# Patient Record
Sex: Female | Born: 1945 | Race: White | Hispanic: No | Marital: Married | Smoking: Current every day smoker
Health system: Southern US, Community
[De-identification: ages and names within clinical notes are randomized; demographics above are authoritative.]

## PROBLEM LIST (undated history)

## (undated) DIAGNOSIS — Z780 Asymptomatic menopausal state: Secondary | ICD-10-CM

## (undated) DIAGNOSIS — K219 Gastro-esophageal reflux disease without esophagitis: Secondary | ICD-10-CM

## (undated) DIAGNOSIS — D126 Benign neoplasm of colon, unspecified: Secondary | ICD-10-CM

## (undated) DIAGNOSIS — R432 Parageusia: Secondary | ICD-10-CM

## (undated) DIAGNOSIS — L03213 Periorbital cellulitis: Secondary | ICD-10-CM

## (undated) DIAGNOSIS — K589 Irritable bowel syndrome without diarrhea: Secondary | ICD-10-CM

## (undated) DIAGNOSIS — R430 Anosmia: Secondary | ICD-10-CM

## (undated) DIAGNOSIS — T8859XA Other complications of anesthesia, initial encounter: Secondary | ICD-10-CM

## (undated) DIAGNOSIS — A4902 Methicillin resistant Staphylococcus aureus infection, unspecified site: Secondary | ICD-10-CM

## (undated) DIAGNOSIS — I10 Essential (primary) hypertension: Secondary | ICD-10-CM

## (undated) DIAGNOSIS — J449 Chronic obstructive pulmonary disease, unspecified: Secondary | ICD-10-CM

## (undated) DIAGNOSIS — R05 Cough: Secondary | ICD-10-CM

## (undated) DIAGNOSIS — T4145XA Adverse effect of unspecified anesthetic, initial encounter: Secondary | ICD-10-CM

## (undated) DIAGNOSIS — E785 Hyperlipidemia, unspecified: Secondary | ICD-10-CM

## (undated) DIAGNOSIS — R059 Cough, unspecified: Secondary | ICD-10-CM

## (undated) DIAGNOSIS — J189 Pneumonia, unspecified organism: Secondary | ICD-10-CM

## (undated) DIAGNOSIS — R0600 Dyspnea, unspecified: Secondary | ICD-10-CM

## (undated) DIAGNOSIS — N189 Chronic kidney disease, unspecified: Secondary | ICD-10-CM

## (undated) DIAGNOSIS — M1990 Unspecified osteoarthritis, unspecified site: Secondary | ICD-10-CM

## (undated) DIAGNOSIS — R413 Other amnesia: Secondary | ICD-10-CM

## (undated) DIAGNOSIS — R112 Nausea with vomiting, unspecified: Secondary | ICD-10-CM

## (undated) DIAGNOSIS — Z9889 Other specified postprocedural states: Secondary | ICD-10-CM

## (undated) DIAGNOSIS — F419 Anxiety disorder, unspecified: Secondary | ICD-10-CM

## (undated) HISTORY — DX: Cough, unspecified: R05.9

## (undated) HISTORY — DX: Essential (primary) hypertension: I10

## (undated) HISTORY — DX: Anosmia: R43.0

## (undated) HISTORY — PX: POLYPECTOMY: SHX149

## (undated) HISTORY — DX: Asymptomatic menopausal state: Z78.0

## (undated) HISTORY — DX: Methicillin resistant Staphylococcus aureus infection, unspecified site: A49.02

## (undated) HISTORY — DX: Irritable bowel syndrome, unspecified: K58.9

## (undated) HISTORY — PX: TUBAL LIGATION: SHX77

## (undated) HISTORY — DX: Cough: R05

## (undated) HISTORY — DX: Unspecified osteoarthritis, unspecified site: M19.90

## (undated) HISTORY — DX: Benign neoplasm of colon, unspecified: D12.6

## (undated) HISTORY — DX: Periorbital cellulitis: L03.213

## (undated) HISTORY — PX: BACK SURGERY: SHX140

## (undated) HISTORY — DX: Other amnesia: R41.3

## (undated) HISTORY — DX: Hyperlipidemia, unspecified: E78.5

## (undated) HISTORY — DX: Parageusia: R43.2

## (undated) HISTORY — PX: APPENDECTOMY: SHX54

---

## 1999-01-10 ENCOUNTER — Ambulatory Visit: Admission: RE | Admit: 1999-01-10 | Discharge: 1999-01-10 | Payer: Self-pay | Admitting: Vascular Surgery

## 1999-01-10 ENCOUNTER — Encounter: Payer: Self-pay | Admitting: Vascular Surgery

## 1999-09-06 ENCOUNTER — Other Ambulatory Visit: Admission: RE | Admit: 1999-09-06 | Discharge: 1999-09-06 | Payer: Self-pay | Admitting: Family Medicine

## 2000-10-20 ENCOUNTER — Other Ambulatory Visit: Admission: RE | Admit: 2000-10-20 | Discharge: 2000-10-20 | Payer: Self-pay | Admitting: Family Medicine

## 2000-11-20 ENCOUNTER — Other Ambulatory Visit: Admission: RE | Admit: 2000-11-20 | Discharge: 2000-11-20 | Payer: Self-pay | Admitting: *Deleted

## 2000-11-20 ENCOUNTER — Encounter (INDEPENDENT_AMBULATORY_CARE_PROVIDER_SITE_OTHER): Payer: Self-pay | Admitting: Specialist

## 2001-07-16 ENCOUNTER — Other Ambulatory Visit: Admission: RE | Admit: 2001-07-16 | Discharge: 2001-07-16 | Payer: Self-pay | Admitting: Family Medicine

## 2004-07-19 ENCOUNTER — Other Ambulatory Visit: Admission: RE | Admit: 2004-07-19 | Discharge: 2004-07-19 | Payer: Self-pay | Admitting: Family Medicine

## 2004-12-13 ENCOUNTER — Ambulatory Visit: Payer: Self-pay | Admitting: Family Medicine

## 2004-12-16 DIAGNOSIS — L03213 Periorbital cellulitis: Secondary | ICD-10-CM

## 2004-12-16 DIAGNOSIS — A4902 Methicillin resistant Staphylococcus aureus infection, unspecified site: Secondary | ICD-10-CM

## 2004-12-16 HISTORY — DX: Methicillin resistant Staphylococcus aureus infection, unspecified site: A49.02

## 2004-12-16 HISTORY — DX: Periorbital cellulitis: L03.213

## 2005-01-08 ENCOUNTER — Ambulatory Visit: Payer: Self-pay | Admitting: Family Medicine

## 2005-04-09 ENCOUNTER — Ambulatory Visit: Payer: Self-pay | Admitting: Family Medicine

## 2005-07-02 ENCOUNTER — Ambulatory Visit: Payer: Self-pay | Admitting: Family Medicine

## 2005-07-12 ENCOUNTER — Emergency Department (HOSPITAL_COMMUNITY): Admission: EM | Admit: 2005-07-12 | Discharge: 2005-07-12 | Payer: Self-pay | Admitting: Emergency Medicine

## 2005-07-16 ENCOUNTER — Ambulatory Visit (HOSPITAL_COMMUNITY): Admission: RE | Admit: 2005-07-16 | Discharge: 2005-07-16 | Payer: Self-pay | Admitting: Otolaryngology

## 2005-07-18 ENCOUNTER — Emergency Department (HOSPITAL_COMMUNITY): Admission: EM | Admit: 2005-07-18 | Discharge: 2005-07-18 | Payer: Self-pay | Admitting: Emergency Medicine

## 2005-07-23 ENCOUNTER — Ambulatory Visit: Payer: Self-pay | Admitting: Family Medicine

## 2005-08-07 ENCOUNTER — Ambulatory Visit: Payer: Self-pay | Admitting: Family Medicine

## 2005-08-21 ENCOUNTER — Ambulatory Visit: Payer: Self-pay | Admitting: Family Medicine

## 2005-10-22 ENCOUNTER — Ambulatory Visit: Payer: Self-pay | Admitting: Family Medicine

## 2005-10-29 ENCOUNTER — Other Ambulatory Visit: Admission: RE | Admit: 2005-10-29 | Discharge: 2005-10-29 | Payer: Self-pay | Admitting: Family Medicine

## 2005-10-29 ENCOUNTER — Ambulatory Visit: Payer: Self-pay | Admitting: Family Medicine

## 2005-10-29 ENCOUNTER — Encounter: Payer: Self-pay | Admitting: Family Medicine

## 2006-05-15 ENCOUNTER — Ambulatory Visit: Payer: Self-pay | Admitting: Family Medicine

## 2006-06-03 ENCOUNTER — Ambulatory Visit: Payer: Self-pay | Admitting: Family Medicine

## 2006-07-16 ENCOUNTER — Encounter: Payer: Self-pay | Admitting: Family Medicine

## 2006-07-25 ENCOUNTER — Ambulatory Visit: Payer: Self-pay | Admitting: Family Medicine

## 2006-07-31 ENCOUNTER — Other Ambulatory Visit: Admission: RE | Admit: 2006-07-31 | Discharge: 2006-07-31 | Payer: Self-pay | Admitting: Family Medicine

## 2006-07-31 ENCOUNTER — Ambulatory Visit: Payer: Self-pay | Admitting: Family Medicine

## 2006-07-31 ENCOUNTER — Encounter: Payer: Self-pay | Admitting: Family Medicine

## 2006-11-25 ENCOUNTER — Ambulatory Visit: Payer: Self-pay | Admitting: Gastroenterology

## 2006-12-16 DIAGNOSIS — D126 Benign neoplasm of colon, unspecified: Secondary | ICD-10-CM

## 2006-12-16 HISTORY — DX: Benign neoplasm of colon, unspecified: D12.6

## 2006-12-30 ENCOUNTER — Ambulatory Visit: Payer: Self-pay | Admitting: Gastroenterology

## 2006-12-30 ENCOUNTER — Encounter (INDEPENDENT_AMBULATORY_CARE_PROVIDER_SITE_OTHER): Payer: Self-pay | Admitting: *Deleted

## 2007-01-06 ENCOUNTER — Ambulatory Visit: Payer: Self-pay | Admitting: Family Medicine

## 2007-01-06 LAB — CONVERTED CEMR LAB
CA 125: 9.1 units/mL (ref 0.0–30.2)
Sed Rate: 18 mm/hr (ref 0–22)

## 2007-01-07 ENCOUNTER — Encounter: Admission: RE | Admit: 2007-01-07 | Discharge: 2007-01-07 | Payer: Self-pay | Admitting: Family Medicine

## 2007-01-07 LAB — CONVERTED CEMR LAB
ALT: 22 units/L (ref 0–40)
Alkaline Phosphatase: 108 units/L (ref 39–117)
Basophils Relative: 0 % (ref 0.0–1.0)
Calcium: 9.4 mg/dL (ref 8.4–10.5)
Eosinophils Absolute: 0.1 10*3/uL (ref 0.0–0.6)
Eosinophils Relative: 1 % (ref 0.0–5.0)
Glucose, Bld: 107 mg/dL — ABNORMAL HIGH (ref 70–99)
Hgb A1c MFr Bld: 6.7 % — ABNORMAL HIGH (ref 4.6–6.0)
MCV: 89.7 fL (ref 78.0–100.0)
Platelets: 329 10*3/uL (ref 150–400)
Potassium: 4.3 meq/L (ref 3.5–5.1)
RBC: 4.9 M/uL (ref 3.87–5.11)
RDW: 13.2 % (ref 11.5–14.6)
Total Protein: 6.7 g/dL (ref 6.0–8.3)
WBC: 11.6 10*3/uL — ABNORMAL HIGH (ref 4.5–10.5)

## 2007-01-09 ENCOUNTER — Encounter: Admission: RE | Admit: 2007-01-09 | Discharge: 2007-01-09 | Payer: Self-pay | Admitting: *Deleted

## 2007-02-18 ENCOUNTER — Ambulatory Visit: Payer: Self-pay | Admitting: Family Medicine

## 2007-02-24 ENCOUNTER — Ambulatory Visit: Payer: Self-pay | Admitting: Family Medicine

## 2007-05-18 ENCOUNTER — Ambulatory Visit: Payer: Self-pay | Admitting: Family Medicine

## 2007-07-20 ENCOUNTER — Ambulatory Visit: Payer: Self-pay | Admitting: Family Medicine

## 2007-07-20 LAB — CONVERTED CEMR LAB
ALT: 18 units/L (ref 0–35)
AST: 20 units/L (ref 0–37)
Albumin: 3.8 g/dL (ref 3.5–5.2)
Basophils Absolute: 0.1 10*3/uL (ref 0.0–0.1)
Bilirubin Urine: NEGATIVE
Blood in Urine, dipstick: NEGATIVE
Calcium: 9.1 mg/dL (ref 8.4–10.5)
Chloride: 105 meq/L (ref 96–112)
Cholesterol: 136 mg/dL (ref 0–200)
Eosinophils Absolute: 0.1 10*3/uL (ref 0.0–0.6)
GFR calc Af Amer: 94 mL/min
GFR calc non Af Amer: 78 mL/min
Glucose, Urine, Semiquant: NEGATIVE
HDL: 56.9 mg/dL (ref >39.0)
MCHC: 34.9 g/dL (ref 30.0–36.0)
MCV: 89.1 fL (ref 78.0–100.0)
Monocytes Relative: 6.7 % (ref 3.0–11.0)
Platelets: 304 10*3/uL (ref 150–400)
Protein, U semiquant: NEGATIVE
RBC: 4.24 M/uL (ref 3.87–5.11)
Sodium: 143 meq/L (ref 135–145)
TSH: 2.33 microintl units/mL (ref 0.35–5.50)
Total CHOL/HDL Ratio: 2.4
Triglycerides: 140 mg/dL (ref 0–149)
WBC Urine, dipstick: NEGATIVE
pH: 6

## 2007-07-21 ENCOUNTER — Telehealth: Payer: Self-pay | Admitting: Family Medicine

## 2007-07-21 ENCOUNTER — Encounter: Payer: Self-pay | Admitting: Family Medicine

## 2007-07-21 DIAGNOSIS — E119 Type 2 diabetes mellitus without complications: Secondary | ICD-10-CM | POA: Insufficient documentation

## 2007-07-21 DIAGNOSIS — I10 Essential (primary) hypertension: Secondary | ICD-10-CM | POA: Insufficient documentation

## 2007-07-21 DIAGNOSIS — E785 Hyperlipidemia, unspecified: Secondary | ICD-10-CM | POA: Insufficient documentation

## 2007-07-21 DIAGNOSIS — J309 Allergic rhinitis, unspecified: Secondary | ICD-10-CM | POA: Insufficient documentation

## 2007-07-28 ENCOUNTER — Encounter: Admission: RE | Admit: 2007-07-28 | Discharge: 2007-07-28 | Payer: Self-pay | Admitting: Family Medicine

## 2007-07-30 ENCOUNTER — Encounter: Payer: Self-pay | Admitting: Family Medicine

## 2007-07-30 ENCOUNTER — Other Ambulatory Visit: Admission: RE | Admit: 2007-07-30 | Discharge: 2007-07-30 | Payer: Self-pay | Admitting: Family Medicine

## 2007-07-30 ENCOUNTER — Ambulatory Visit: Payer: Self-pay | Admitting: Family Medicine

## 2007-10-07 ENCOUNTER — Ambulatory Visit: Payer: Self-pay | Admitting: Family Medicine

## 2007-10-07 DIAGNOSIS — M109 Gout, unspecified: Secondary | ICD-10-CM | POA: Insufficient documentation

## 2007-10-19 ENCOUNTER — Telehealth: Payer: Self-pay | Admitting: Family Medicine

## 2007-10-20 ENCOUNTER — Telehealth: Payer: Self-pay | Admitting: Family Medicine

## 2007-10-21 LAB — CONVERTED CEMR LAB: Hgb A1c MFr Bld: 6 % (ref 4.6–6.0)

## 2007-10-27 ENCOUNTER — Telehealth: Payer: Self-pay | Admitting: Family Medicine

## 2008-01-07 ENCOUNTER — Ambulatory Visit: Payer: Self-pay | Admitting: Family Medicine

## 2008-01-07 DIAGNOSIS — M129 Arthropathy, unspecified: Secondary | ICD-10-CM | POA: Insufficient documentation

## 2008-01-19 ENCOUNTER — Telehealth: Payer: Self-pay | Admitting: Family Medicine

## 2008-01-20 ENCOUNTER — Ambulatory Visit: Payer: Self-pay | Admitting: Family Medicine

## 2008-02-16 ENCOUNTER — Ambulatory Visit: Payer: Self-pay | Admitting: Family Medicine

## 2008-03-01 ENCOUNTER — Telehealth: Payer: Self-pay | Admitting: Family Medicine

## 2008-03-03 ENCOUNTER — Telehealth: Payer: Self-pay | Admitting: Family Medicine

## 2008-03-16 ENCOUNTER — Telehealth: Payer: Self-pay | Admitting: Family Medicine

## 2008-03-29 ENCOUNTER — Ambulatory Visit: Payer: Self-pay | Admitting: Family Medicine

## 2008-03-29 DIAGNOSIS — D649 Anemia, unspecified: Secondary | ICD-10-CM | POA: Insufficient documentation

## 2008-03-29 DIAGNOSIS — E039 Hypothyroidism, unspecified: Secondary | ICD-10-CM | POA: Insufficient documentation

## 2008-03-31 LAB — CONVERTED CEMR LAB
Basophils Relative: 0.5 % (ref 0.0–1.0)
CO2: 31 meq/L (ref 19–32)
Chloride: 108 meq/L (ref 96–112)
Eosinophils Absolute: 0.1 10*3/uL (ref 0.0–0.7)
Eosinophils Relative: 1.3 % (ref 0.0–5.0)
Hgb A1c MFr Bld: 6.3 % — ABNORMAL HIGH (ref 4.6–6.0)
MCV: 90.1 fL (ref 78.0–100.0)
Monocytes Relative: 6.5 % (ref 3.0–12.0)
Neutrophils Relative %: 63.4 % (ref 43.0–77.0)
Potassium: 4.1 meq/L (ref 3.5–5.1)
RBC: 4.17 M/uL (ref 3.87–5.11)
TSH: 3.75 microintl units/mL (ref 0.35–5.50)
Uric Acid, Serum: 4.8 mg/dL (ref 2.4–7.0)
WBC: 8.5 10*3/uL (ref 4.5–10.5)

## 2008-07-20 ENCOUNTER — Encounter: Payer: Self-pay | Admitting: Family Medicine

## 2008-07-26 ENCOUNTER — Ambulatory Visit: Payer: Self-pay | Admitting: Family Medicine

## 2008-07-26 LAB — CONVERTED CEMR LAB
Ketones, urine, test strip: NEGATIVE
Nitrite: NEGATIVE
Specific Gravity, Urine: 1.015
Urobilinogen, UA: 0.2
WBC Urine, dipstick: NEGATIVE

## 2008-08-02 ENCOUNTER — Other Ambulatory Visit: Admission: RE | Admit: 2008-08-02 | Discharge: 2008-08-02 | Payer: Self-pay | Admitting: Family Medicine

## 2008-08-02 ENCOUNTER — Ambulatory Visit: Payer: Self-pay | Admitting: Family Medicine

## 2008-08-02 ENCOUNTER — Encounter: Payer: Self-pay | Admitting: Family Medicine

## 2008-08-08 LAB — CONVERTED CEMR LAB
AST: 25 units/L (ref 0–37)
Albumin: 4 g/dL (ref 3.5–5.2)
Alkaline Phosphatase: 104 units/L (ref 39–117)
BUN: 12 mg/dL (ref 6–23)
Basophils Relative: 1 % (ref 0.0–3.0)
Chloride: 102 meq/L (ref 96–112)
Creatinine, Ser: 0.7 mg/dL (ref 0.4–1.2)
Creatinine,U: 85 mg/dL
Eosinophils Absolute: 0.1 10*3/uL (ref 0.0–0.7)
Eosinophils Relative: 1.5 % (ref 0.0–5.0)
GFR calc Af Amer: 109 mL/min
GFR calc non Af Amer: 90 mL/min
Glucose, Bld: 112 mg/dL — ABNORMAL HIGH (ref 70–99)
HCT: 39.8 % (ref 36.0–46.0)
Hemoglobin: 14 g/dL (ref 12.0–15.0)
Hgb A1c MFr Bld: 6.5 % — ABNORMAL HIGH (ref 4.6–6.0)
MCV: 89.2 fL (ref 78.0–100.0)
Monocytes Absolute: 0.5 10*3/uL (ref 0.1–1.0)
Monocytes Relative: 5.8 % (ref 3.0–12.0)
Potassium: 3.6 meq/L (ref 3.5–5.1)
RBC: 4.47 M/uL (ref 3.87–5.11)
Total CHOL/HDL Ratio: 3.2
Total Protein: 6.9 g/dL (ref 6.0–8.3)
WBC: 8.6 10*3/uL (ref 4.5–10.5)

## 2008-08-17 LAB — CONVERTED CEMR LAB: Vit D, 1,25-Dihydroxy: 29 — ABNORMAL LOW (ref 30–89)

## 2008-10-19 ENCOUNTER — Telehealth: Payer: Self-pay | Admitting: Family Medicine

## 2009-01-03 ENCOUNTER — Ambulatory Visit: Payer: Self-pay | Admitting: Family Medicine

## 2009-01-04 LAB — CONVERTED CEMR LAB
Hgb A1c MFr Bld: 6.4 % — ABNORMAL HIGH (ref 4.6–6.0)
Rhuematoid fact SerPl-aCnc: <20 intl units/mL — ABNORMAL LOW (ref 0.0–20.0)

## 2009-01-05 ENCOUNTER — Telehealth: Payer: Self-pay | Admitting: Family Medicine

## 2009-02-08 ENCOUNTER — Ambulatory Visit: Payer: Self-pay | Admitting: Family Medicine

## 2009-02-08 DIAGNOSIS — J209 Acute bronchitis, unspecified: Secondary | ICD-10-CM | POA: Insufficient documentation

## 2009-04-04 ENCOUNTER — Telehealth: Payer: Self-pay | Admitting: Family Medicine

## 2009-07-25 ENCOUNTER — Encounter: Payer: Self-pay | Admitting: Family Medicine

## 2009-08-01 ENCOUNTER — Ambulatory Visit: Payer: Self-pay | Admitting: Family Medicine

## 2009-08-01 LAB — CONVERTED CEMR LAB
Bilirubin Urine: NEGATIVE
Glucose, Urine, Semiquant: NEGATIVE
Specific Gravity, Urine: 1.01
WBC Urine, dipstick: NEGATIVE
pH: 6

## 2009-08-08 ENCOUNTER — Ambulatory Visit: Payer: Self-pay | Admitting: Family Medicine

## 2009-08-08 LAB — CONVERTED CEMR LAB
AST: 26 units/L (ref 0–37)
Albumin: 4.2 g/dL (ref 3.5–5.2)
BUN: 12 mg/dL (ref 6–23)
CO2: 30 meq/L (ref 19–32)
Chloride: 105 meq/L (ref 96–112)
Cholesterol: 156 mg/dL (ref 0–200)
Creatinine, Ser: 0.7 mg/dL (ref 0.4–1.2)
Eosinophils Absolute: 0.1 10*3/uL (ref 0.0–0.7)
Eosinophils Relative: 1.1 % (ref 0.0–5.0)
Glucose, Bld: 123 mg/dL — ABNORMAL HIGH (ref 70–99)
HCT: 39.9 % (ref 36.0–46.0)
Hgb A1c MFr Bld: 6.3 % (ref 4.6–6.5)
Lymphs Abs: 1.8 10*3/uL (ref 0.7–4.0)
MCHC: 34 g/dL (ref 30.0–36.0)
MCV: 88.9 fL (ref 78.0–100.0)
Microalb Creat Ratio: 15.3 mg/g (ref 0.0–30.0)
Microalb, Ur: 0.6 mg/dL (ref 0.0–1.9)
Monocytes Absolute: 0.6 10*3/uL (ref 0.1–1.0)
Platelets: 276 10*3/uL (ref 150.0–400.0)
Potassium: 4.2 meq/L (ref 3.5–5.1)
RDW: 13.1 % (ref 11.5–14.6)
TSH: 2.55 microintl units/mL (ref 0.35–5.50)
WBC: 9.3 10*3/uL (ref 4.5–10.5)

## 2009-11-28 ENCOUNTER — Telehealth: Payer: Self-pay | Admitting: Family Medicine

## 2010-01-02 ENCOUNTER — Encounter: Payer: Self-pay | Admitting: Family Medicine

## 2010-06-05 ENCOUNTER — Telehealth: Payer: Self-pay | Admitting: Family Medicine

## 2010-07-26 ENCOUNTER — Encounter: Payer: Self-pay | Admitting: Family Medicine

## 2010-08-02 ENCOUNTER — Ambulatory Visit: Payer: Self-pay | Admitting: Family Medicine

## 2010-08-07 ENCOUNTER — Ambulatory Visit: Payer: Self-pay | Admitting: Family Medicine

## 2010-08-07 ENCOUNTER — Other Ambulatory Visit: Admission: RE | Admit: 2010-08-07 | Discharge: 2010-08-07 | Payer: Self-pay | Admitting: Family Medicine

## 2010-08-07 LAB — HM PAP SMEAR

## 2010-08-08 LAB — CONVERTED CEMR LAB
ALT: 18 units/L (ref 0–35)
AST: 21 units/L (ref 0–37)
BUN: 17 mg/dL (ref 6–23)
Bilirubin Urine: NEGATIVE
Bilirubin, Direct: 0.1 mg/dL (ref 0.0–0.3)
Calcium: 9.1 mg/dL (ref 8.4–10.5)
Cholesterol: 145 mg/dL (ref 0–200)
Creatinine, Ser: 0.9 mg/dL (ref 0.4–1.2)
Eosinophils Relative: 1.4 % (ref 0.0–5.0)
GFR calc non Af Amer: 69.68 mL/min (ref >60)
Hgb A1c MFr Bld: 6.3 % (ref 4.6–6.5)
Ketones, urine, test strip: NEGATIVE
LDL Cholesterol: 55 mg/dL (ref 0–99)
Microalb Creat Ratio: 0.4 mg/g (ref 0.0–30.0)
Monocytes Absolute: 0.7 10*3/uL (ref 0.1–1.0)
Monocytes Relative: 7.4 % (ref 3.0–12.0)
Neutrophils Relative %: 70.7 % (ref 43.0–77.0)
Platelets: 275 10*3/uL (ref 150.0–400.0)
Protein, U semiquant: NEGATIVE
Total Bilirubin: 0.6 mg/dL (ref 0.3–1.2)
Triglycerides: 196 mg/dL — ABNORMAL HIGH (ref 0.0–149.0)
Urobilinogen, UA: 0.2
VLDL: 39.2 mg/dL (ref 0.0–40.0)
WBC: 9.2 10*3/uL (ref 4.5–10.5)

## 2010-08-14 ENCOUNTER — Encounter: Payer: Self-pay | Admitting: Family Medicine

## 2010-08-23 ENCOUNTER — Encounter: Payer: Self-pay | Admitting: Family Medicine

## 2010-09-04 ENCOUNTER — Telehealth: Payer: Self-pay | Admitting: Family Medicine

## 2010-09-07 ENCOUNTER — Ambulatory Visit: Payer: Self-pay | Admitting: Family Medicine

## 2010-09-07 DIAGNOSIS — F17200 Nicotine dependence, unspecified, uncomplicated: Secondary | ICD-10-CM | POA: Insufficient documentation

## 2010-09-07 LAB — CONVERTED CEMR LAB
Glucose, Urine, Semiquant: NEGATIVE
Nitrite: NEGATIVE
Protein, U semiquant: 30
Urobilinogen, UA: 0.2

## 2010-09-08 ENCOUNTER — Encounter: Payer: Self-pay | Admitting: Family Medicine

## 2010-09-17 ENCOUNTER — Ambulatory Visit: Payer: Self-pay | Admitting: Family Medicine

## 2010-09-17 DIAGNOSIS — N3000 Acute cystitis without hematuria: Secondary | ICD-10-CM | POA: Insufficient documentation

## 2010-09-18 ENCOUNTER — Encounter: Payer: Self-pay | Admitting: Family Medicine

## 2010-12-31 ENCOUNTER — Ambulatory Visit
Admission: RE | Admit: 2010-12-31 | Discharge: 2010-12-31 | Payer: Self-pay | Source: Home / Self Care | Attending: Internal Medicine | Admitting: Internal Medicine

## 2011-01-03 ENCOUNTER — Telehealth: Payer: Self-pay | Admitting: Internal Medicine

## 2011-01-13 LAB — CONVERTED CEMR LAB: Pap Smear: NEGATIVE

## 2011-01-15 NOTE — Progress Notes (Signed)
Summary: REQ FOR RETURN CALL  Phone Note Call from Patient   Caller: Patient Summary of Call: Pt called to speak with Dr Scotty Court... Pt was advised that Dr Scotty Court was with patients at this time... Pt requested a return call to her in ref to a personal matter (would not elaborate reason).... Pt can be reached at [phone].  Initial call taken by: Debbra Riding,  June 05, 2010 10:35 AM  Follow-up for Phone Call        will call patient

## 2011-01-15 NOTE — Assessment & Plan Note (Signed)
Summary: CPX // RS/PT [facility] FROM BMP/CJR/rsc per doc/njr   Vital Signs:  Patient profile:   65 year old female Menstrual status:  postmenopausal Height:      63 inches Weight:      196 pounds BMI:     34.85 Temp:     98 degrees F Pulse (ortho):   92 / minute Pulse rhythm:   regular BP sitting:   136 / 80  (left arm)  Vitals Entered By: Pura Spice, RN (August 07, 2010 10:34 AM) CC: cpx with pap    History of Present Illness: This 65 year old white married female in for complete physical examination she is a known diabetic control with glipizide 5 mg each day, hypertensive control Lotrel, hyperlipidemia control the Lipitor past it history of gout and takes allopurinol which died At some anxiety and stress and takes diazepam 5 mg t.i.d. p.r.n. for stress has history of allergic rhinitis controlled with Flonase nasal for a Does have an unexplained call which I have prescribed promethazine codeine when needed Has had problem with obesity and almost impossible to lose Main complaint today is that of pain in the left heel and also sciatica of the left leg left hip and left  Allergies: 1)  ! Phenobarbital (Phenobarbital)  Past History:  Past Medical History: Last updated: 07/21/2007 Allergic rhinitis Diabetes mellitus, type II Hyperlipidemia Hypertension menopause  2002  Risk Factors: Smoking Status: current (08/08/2009) Packs/Day: 1.0 (08/08/2009)  Review of Systems      See HPI  The patient denies anorexia, fever, weight loss, weight gain, vision loss, decreased hearing, hoarseness, chest pain, syncope, dyspnea on exertion, peripheral edema, prolonged cough, headaches, hemoptysis, abdominal pain, melena, hematochezia, severe indigestion/heartburn, hematuria, incontinence, genital sores, muscle weakness, suspicious skin lesions, transient blindness, difficulty walking, depression, unusual weight change, abnormal bleeding, enlarged lymph nodes, angioedema, breast masses, and  testicular masses.    Physical Exam  General:  Well-developed,well-nourished,in no acute distress; alert,appropriate and cooperative throughout examinationoverweight-appearing.   Head:  Normocephalic and atraumatic without obvious abnormalities. No apparent alopecia or balding. Eyes:  No corneal or conjunctival inflammation noted. EOMI. Perrla. Funduscopic exam benign, without hemorrhages, exudates or papilledema. Vision grossly normal. Ears:  External ear exam shows no significant lesions or deformities.  Otoscopic examination reveals clear canals, tympanic membranes are intact bilaterally without bulging, retraction, inflammation or discharge. Hearing is grossly normal bilaterally. Nose:  swollen boggy nasal mucosa bilaterally are clear dry and Mouth:  Oral mucosa and oropharynx without lesions or exudates.  Teeth in good repair. Neck:  No deformities, masses, or tenderness noted. Chest Wall:  No deformities, masses, or tenderness noted. Breasts:  No mass, nodules, thickening, tenderness, bulging, retraction, inflamation, nipple discharge or skin changes noted.   Lungs:  Normal respiratory effort, chest expands symmetrically. Lungs are clear to auscultation, no crackles or wheezes. Heart:  Normal rate and regular rhythm. S1 and S2 normal without gallop, murmur, click, rub or other extra sounds. Abdomen:  Bowel sounds positive,abdomen soft and non-tender without masses, organomegaly or hernias noted. Rectal:  No external abnormalities noted. Normal sphincter tone. No rectal masses or tenderness. Genitalia:  Normal introitus for age, no external lesions, no vaginal discharge, mucosa pink and moist, no vaginal or cervical lesions, no vaginal atrophy, no friaility or hemorrhage, normal uterus size and position, no adnexal masses or tenderness Msk:  marked tenderness left heel also left SI joint and minimal tenderness over the right SI joint No limitation of movement Pulses:  R and L  carotid,radial,femoral,dorsalis pedis and posterior tibial pulses are full and equal bilaterally Extremities:  trace pretibial edema Neurologic:  No cranial nerve deficits noted. Station and gait are normal. Plantar reflexes are down-going bilaterally. DTRs are symmetrical throughout. Sensory, motor and coordinative functions appear intact. Skin:  has several small hyperkeratotic benign appearing skin lesions for which the patient is to see the dermatologist for wide awake Cervical Nodes:  No lymphadenopathy noted Axillary Nodes:  No palpable lymphadenopathy Inguinal Nodes:  No significant adenopathy Psych:  Cognition and judgment appear intact. Alert and cooperative with normal attention span and concentration. No apparent delusions, illusions, hallucinations   Impression & Recommendations:  Problem # 1:  EDEMA- LOCALIZED (ICD-782.3) Assessment Improved  Her updated medication list for this problem includes:    Hydrochlorothiazide 25 Mg Tabs (Hydrochlorothiazide) .Marland Kitchen... Take 1 tab bid  Problem # 2:  ARTHRITIS (ICD-716.90) Assessment: Deteriorated  Orders: Depo- Medrol 80mg  (J1040) Depo- Medrol 40mg  (J1030) Admin of Therapeutic Inj  intramuscular or subcutaneous (04540)  Problem # 3:  GOUT NOS (ICD-274.9) Assessment: Improved  Her updated medication list for this problem includes:    Allopurinol 100 Mg Tabs (Allopurinol) .Marland Kitchen... 1 by mouth two times a day    Diclofenac Sodium 75 Mg Tbec (Diclofenac sodium) .Marland Kitchen... 1 by mouth two times a day pc  Problem # 4:  WELL ADULT EXAM (ICD-V70.0) Assessment: Unchanged  Problem # 5:  HYPERTENSION (ICD-401.9) Assessment: Improved  Her updated medication list for this problem includes:    Lotrel 10-20 Mg Caps (Amlodipine besy-benazepril hcl) .Marland Kitchen... 1 once daily    Hydrochlorothiazide 25 Mg Tabs (Hydrochlorothiazide) .Marland Kitchen... Take 1 tab bid  Problem # 6:  HYPERLIPIDEMIA (ICD-272.4) Assessment: Improved  Her updated medication list for this  problem includes:    Lipitor 40 Mg Tabs (Atorvastatin calcium) ..... Once daily  Orders: EKG w/ Interpretation (93000)  Problem # 7:  DIABETES MELLITUS, TYPE II (ICD-250.00) Assessment: Improved  Her updated medication list for this problem includes:    Glipizide Xl 5 Mg Tb24 (Glipizide) .Marland Kitchen... Take 1 tablet by mouth once a day    Lotrel 10-20 Mg Caps (Amlodipine besy-benazepril hcl) .Marland Kitchen... 1 once daily  Problem # 8:  ALLERGIC RHINITIS (ICD-477.9) Assessment: Improved  Her updated medication list for this problem includes:    Flonase 50 Mcg/act Susp (Fluticasone propionate) ..... Inhale 1 puff two times a day    Promethazine Hcl 25 Mg Tabs (Promethazine hcl) .Marland Kitchen... Take 1 every 4 hrs as needed for nausea  Complete Medication List: 1)  Flonase 50 Mcg/act Susp (Fluticasone propionate) .... Inhale 1 puff two times a day 2)  Alrex 0.2 % Susp (Loteprednol etabonate) .Marland Kitchen.. 1 drop each eye twice a day 3)  Glipizide Xl 5 Mg Tb24 (Glipizide) .... Take 1 tablet by mouth once a day 4)  Diazepam 5 Mg Tabs (Diazepam) .... Three times a day 5)  Promethazine Hcl 25 Mg Tabs (Promethazine hcl) .... Take 1 every 4 hrs as needed for nausea 6)  Lipitor 40 Mg Tabs (Atorvastatin calcium) .... Once daily 7)  Desonide 0.05 % Crea (Desonide) .... Apply two times a day as needed 8)  Promethazine-codeine 6.25-10 Mg/51ml Syrp (Promethazine-codeine) .... 2 tsp q4h as needed 9)  Lotrel 10-20 Mg Caps (Amlodipine besy-benazepril hcl) .Marland Kitchen.. 1 once daily 10)  Hydrochlorothiazide 25 Mg Tabs (Hydrochlorothiazide) .... Take 1 tab bid 11)  Allopurinol 100 Mg Tabs (Allopurinol) .Marland Kitchen.. 1 by mouth two times a day 12)  Diclofenac Sodium 75 Mg Tbec (Diclofenac sodium) .Marland KitchenMarland KitchenMarland Kitchen 1  by mouth two times a day pc  Patient Instructions: 1)  You need to lose weight. Consider a lower calorie diet and regular exercise.  2)  for the acute episode of arthritis in the left heel and the sciatica have given you Depo-Medrol 120 mg IM and will continue  diclofenac 3)  Continue other medications as prescribed 4)  Have refilled her meds Prescriptions: PROMETHAZINE-CODEINE 6.25-10 MG/5ML  SYRP (PROMETHAZINE-CODEINE) 2 tsp q4h as needed  #500 cc x 5   Entered and Authorized by:   Judithann Sheen MD   Signed by:   Judithann Sheen MD on 08/07/2010   Method used:   Print then Give to Patient   RxID:   [id number] DIAZEPAM 5 MG  TABS (DIAZEPAM) three times a day  #270 x 1   Entered and Authorized by:   Judithann Sheen MD   Signed by:   Judithann Sheen MD on 08/07/2010   Method used:   Print then Give to Patient   RxID:   [id number] DICLOFENAC SODIUM 75 MG TBEC (DICLOFENAC SODIUM) 1 by mouth two times a day pc  #180 x 3   Entered and Authorized by:   Judithann Sheen MD   Signed by:   Judithann Sheen MD on 08/07/2010   Method used:   Faxed to ...       [facility] (mail-order)             , [place]         Ph: [phone]       Fax: [phone]   RxID:   [id number] ALLOPURINOL 100 MG TABS (ALLOPURINOL) 1 by mouth two times a day  #180 x 3   Entered and Authorized by:   Judithann Sheen MD   Signed by:   Judithann Sheen MD on 08/07/2010   Method used:   Faxed to ...       [facility] (mail-order)             , [place]         Ph: [phone]       Fax: [phone]   RxID:   [id number] HYDROCHLOROTHIAZIDE 25 MG  TABS (HYDROCHLOROTHIAZIDE) Take 1 tab bid  #180 x 3   Entered and Authorized by:   Judithann Sheen MD   Signed by:   Judithann Sheen MD on 08/07/2010   Method used:   Faxed to ...       [facility] (mail-order)             , [place]         Ph: [phone]       Fax: [phone]   RxID:   [id number] LOTREL 10-20 MG  CAPS (AMLODIPINE BESY-BENAZEPRIL HCL) 1 once daily  #90 x 3   Entered and Authorized by:   Judithann Sheen MD   Signed by:   Judithann Sheen MD on 08/07/2010   Method used:   Faxed to ...       [facility] (mail-order)             , [place]         Ph:  [phone]       Fax: [phone]   RxID:   [id number] DESONIDE 0.05 %  CREA (DESONIDE) apply two times a day as needed  #60gms x 3   Entered and Authorized by:  Judithann Sheen MD   Signed by:   Judithann Sheen MD on 08/07/2010   Method used:   Faxed to ...       [facility] (mail-order)             , [place]         Ph: [phone]       Fax: [phone]   RxID:   [id number] LIPITOR 40 MG  TABS (ATORVASTATIN CALCIUM) once daily  #90 x 3   Entered and Authorized by:   Judithann Sheen MD   Signed by:   Judithann Sheen MD on 08/07/2010   Method used:   Faxed to ...       [facility] (mail-order)             , [place]         Ph: [phone]       Fax: [phone]   RxID:   [id number] PROMETHAZINE HCL 25 MG  TABS (PROMETHAZINE HCL) take 1 every 4 hrs as needed for nausea  #300 x 3   Entered and Authorized by:   Judithann Sheen MD   Signed by:   Judithann Sheen MD on 08/07/2010   Method used:   Faxed to ...       [facility] (mail-order)             , [place]         Ph: [phone]       Fax: [phone]   RxID:   [id number] GLIPIZIDE XL 5 MG  TB24 (GLIPIZIDE) Take 1 tablet by mouth once a day  #90 x 3   Entered and Authorized by:   Judithann Sheen MD   Signed by:   Judithann Sheen MD on 08/07/2010   Method used:   Faxed to ...       [facility] (mail-order)             , [place]         Ph: [phone]       Fax: [phone]   RxID:   [id number] ALREX 0.2 %  SUSP (LOTEPREDNOL ETABONATE) 1 drop each eye twice a day  #3 x 3   Entered and Authorized by:   Judithann Sheen MD   Signed by:   Judithann Sheen MD on 08/07/2010   Method used:   Faxed to ...       [facility] (mail-order)             , [place]         Ph: [phone]       Fax: [phone]   RxID:   [id number] FLONASE 50 MCG/ACT  SUSP (FLUTICASONE PROPIONATE) Inhale 1 puff two times a day  #3 x 3   Entered and Authorized by:   Judithann Sheen MD   Signed by:   Judithann Sheen MD on 08/07/2010   Method used:   Faxed to ...       [facility] (mail-order)             , [place]         Ph: [phone]       Fax: [phone]   RxID:   [id number]    Medication Administration  Injection # 1:    Medication: Depo- Medrol 80mg     Diagnosis: ARTHRITIS (ICD-716.90)    Route:  IM    Site: RUOQ gluteus    Exp Date: 03/2013    Lot #: [id number]     Mfr: Pharmacia    Patient tolerated injection without complications    Given by: Pura Spice, RN (August 07, 2010 1:18 PM)  Injection # 2:    Medication: Depo- Medrol 40mg     Diagnosis: ARTHRITIS (ICD-716.90)    Route: IM    Site: RUOQ gluteus    Exp Date: 03/2013    Lot #: [id number]    Mfr: Pharmacia    Patient tolerated injection without complications    Given by: Pura Spice, RN (August 07, 2010 1:20 PM)  Orders Added: 1)  EKG w/ Interpretation [93000] 2)  Depo- Medrol 80mg  [J1040] 3)  Depo- Medrol 40mg  [J1030] 4)  Admin of Therapeutic Inj  intramuscular or subcutaneous [96372] 5)  Est. Patient 40-64 years [[address]]

## 2011-01-15 NOTE — Letter (Signed)
Summary: [facility]   Imported By: Maryln Gottron 02/21/2010 13:41:20  _____________________________________________________________________  External Attachment:    Type:   Image     Comment:   External Document

## 2011-01-15 NOTE — Assessment & Plan Note (Signed)
Summary: UTI? // RS   Vital Signs:  Patient profile:   65 year old female Menstrual status:  postmenopausal Height:      63 inches (160.02 cm) Weight:      195 pounds (88.64 kg) O2 Sat:      99 % on Room air Temp:     97.7 degrees F (36.50 degrees C) oral Pulse rate:   100 / minute BP sitting:   128 / 70  (left arm) Cuff size:   large  Vitals Entered By: Josph Macho RMA (September 07, 2010 1:56 PM)  O2 Flow:  Room air CC: Possible UTI- Burning and hurting when urinating X2 days/ CF Is Patient Diabetic? Yes   History of Present Illness: Patient in today for evaluation of dysuria which began roughly 2 days ago. Just started to see scant blood on the tissue today. Is struggling with urinary frequency/urgency  as well. Denies f/c/malaise/n/v/anorexia/back and abdominal pain. No CP/palp/SOB. No history of frequent  UTIs.  Current Medications (verified): 1)  Flonase 50 Mcg/act  Susp (Fluticasone Propionate) .... Inhale 1 Puff Two Times A Day 2)  Alrex 0.2 %  Susp (Loteprednol Etabonate) .Marland Kitchen.. 1 Drop Each Eye Twice A Day 3)  Glipizide Xl 5 Mg  Tb24 (Glipizide) .... Take 1 Tablet By Mouth Once A Day 4)  Diazepam 5 Mg  Tabs (Diazepam) .... Three Times A Day 5)  Promethazine Hcl 25 Mg  Tabs (Promethazine Hcl) .... Take 1 Every 4 Hrs As Needed For Nausea 6)  Lipitor 40 Mg  Tabs (Atorvastatin Calcium) .... Once Daily 7)  Desonide 0.05 %  Crea (Desonide) .... Apply Two Times A Day As Needed 8)  Promethazine-Codeine 6.25-10 Mg/75ml  Syrp (Promethazine-Codeine) .... 2 Tsp Q4h As Needed 9)  Lotrel 10-20 Mg  Caps (Amlodipine Besy-Benazepril Hcl) .Marland Kitchen.. 1 Once Daily 10)  Hydrochlorothiazide 25 Mg  Tabs (Hydrochlorothiazide) .... Take 1 Tab Bid 11)  Allopurinol 100 Mg Tabs (Allopurinol) .Marland Kitchen.. 1 By Mouth Two Times A Day 12)  Diclofenac Sodium 75 Mg Tbec (Diclofenac Sodium) .Marland Kitchen.. 1 By Mouth Two Times A Day Pc  Allergies (verified): 1)  ! Phenobarbital (Phenobarbital)  Past History:  Past medical  history reviewed for relevance to current acute and chronic problems. Social history (including risk factors) reviewed for relevance to current acute and chronic problems.  Past Medical History: Reviewed history from 07/21/2007 and no changes required. Allergic rhinitis Diabetes mellitus, type II Hyperlipidemia Hypertension menopause  2002  Social History: Reviewed history and no changes required.  Review of Systems      See HPI  Physical Exam  General:  Well-developed,well-nourished,in no acute distress; alert,appropriate and cooperative throughout examination Head:  Normocephalic and atraumatic without obvious abnormalities. No apparent alopecia or balding. Mouth:  Oral mucosa and oropharynx without lesions or exudates.  Teeth in good repair. Lungs:  Normal respiratory effort, chest expands symmetrically. Lungs are clear to auscultation, no crackles or wheezes. Heart:  Normal rate and regular rhythm. S1 and S2 normal without gallop, murmur, click, rub or other extra sounds. Abdomen:  Bowel sounds positive,abdomen soft and non-tender without masses, organomegaly or hernias noted. Psych:  Cognition and judgment appear intact. Alert and cooperative with normal attention span and concentration. No apparent delusions, illusions, hallucinations   Impression & Recommendations:  Problem # 1:  HEMATURIA UNSPECIFIED (ICD-599.70)  Her updated medication list for this problem includes:    Macrobid 100 Mg Caps (Nitrofurantoin monohyd macro) .Marland Kitchen... 1 tab by mouth two times a day x  5 day  Orders: T-Urine Culture (Spectrum Order) [id number]) UA Dipstick w/o Micro (automated)  (81003) Push clear fluids and cranberries  Problem # 2:  HYPERTENSION (ICD-401.9)  Her updated medication list for this problem includes:    Lotrel 10-20 Mg Caps (Amlodipine besy-benazepril hcl) .Marland Kitchen... 1 once daily    Hydrochlorothiazide 25 Mg Tabs (Hydrochlorothiazide) .Marland Kitchen... Take 1 tab bid Well controlled today  no change in meds today  Problem # 3:  TOBACCO ABUSE (ICD-305.1)  encouraged complete cessation. Educated regarding need to decrease intake to decrease health concerns  Orders: Tobacco use cessation intermediate 3-10 minutes (99406)  Complete Medication List: 1)  Flonase 50 Mcg/act Susp (Fluticasone propionate) .... Inhale 1 puff two times a day 2)  Alrex 0.2 % Susp (Loteprednol etabonate) .Marland Kitchen.. 1 drop each eye twice a day 3)  Glipizide Xl 5 Mg Tb24 (Glipizide) .... Take 1 tablet by mouth once a day 4)  Diazepam 5 Mg Tabs (Diazepam) .... Three times a day 5)  Promethazine Hcl 25 Mg Tabs (Promethazine hcl) .... Take 1 every 4 hrs as needed for nausea 6)  Lipitor 40 Mg Tabs (Atorvastatin calcium) .... Once daily 7)  Desonide 0.05 % Crea (Desonide) .... Apply two times a day as needed 8)  Promethazine-codeine 6.25-10 Mg/3ml Syrp (Promethazine-codeine) .... 2 tsp q4h as needed 9)  Lotrel 10-20 Mg Caps (Amlodipine besy-benazepril hcl) .Marland Kitchen.. 1 once daily 10)  Hydrochlorothiazide 25 Mg Tabs (Hydrochlorothiazide) .... Take 1 tab bid 11)  Allopurinol 100 Mg Tabs (Allopurinol) .Marland Kitchen.. 1 by mouth two times a day 12)  Diclofenac Sodium 75 Mg Tbec (Diclofenac sodium) .Marland Kitchen.. 1 by mouth two times a day pc 13)  Macrobid 100 Mg Caps (Nitrofurantoin monohyd macro) .Marland Kitchen.. 1 tab by mouth two times a day x 5 day 14)  Pyridium 200 Mg Tabs (Phenazopyridine hcl) .Marland Kitchen.. 1 tab by mouth three times a day x 2 days 15)  Align 4 Mg Caps (Probiotic product) .Marland Kitchen.. 1 cap by mouth once daily  Patient Instructions: 1)  Drink plenty of fluids up to 3-4 quarts a day. Cranberry juice is especially recommended in addition to large amounts of water. Avoid caffeine & carbonated drinks, they tend to irritate the bladder, Return in 3-5 days if you're not better: sooner if you're feeling worse.  2)  Take your antibiotic as prescribed until ALL of it is gone, but stop if you develop a rash or swelling and contact our office as soon as possible.   3)  Take a probiotic capsule daily while taking an antibiotic Prescriptions: PYRIDIUM 200 MG TABS (PHENAZOPYRIDINE HCL) 1 tab by mouth three times a day x 2 days  #6 x 0   Entered and Authorized by:   Danise Edge MD   Signed by:   Danise Edge MD on 09/07/2010   Method used:   Electronically to        [facility] (retail)       [address]       [place], [place]  [address]       Ph: [phone] or [phone]       Fax: [phone]   RxID:   [id number] MACROBID 100 MG CAPS (NITROFURANTOIN MONOHYD MACRO) 1 tab by mouth two times a day x 5 day  #10 x 0   Entered and Authorized by:   Danise Edge MD   Signed by:   Danise Edge MD on 09/07/2010  Method used:   Electronically to        [facility] (retail)       [address]       [place], [place]  [address]       Ph: [phone] or [phone]       Fax: [phone]   RxID:   [id number]    Immunization History:  Influenza Immunization History:    Influenza:  historical (09/04/2010)  Laboratory Results   Urine Tests    Routine Urinalysis   Color: yellow Appearance: Clear Glucose: negative   (Normal Range: Negative) Bilirubin: negative   (Normal Range: Negative) Ketone: trace (5)   (Normal Range: Negative) Spec. Gravity: 1.025   (Normal Range: 1.003-1.035) Blood: large   (Normal Range: Negative) pH: 5.5   (Normal Range: 5.0-8.0) Protein: 30   (Normal Range: Negative) Urobilinogen: 0.2   (Normal Range: 0-1) Nitrite: negative   (Normal Range: Negative) Leukocyte Esterace: moderate   (Normal Range: Negative)        Laboratory Results   Urine Tests    Routine Urinalysis   Color: yellow Appearance: Clear Glucose: negative   (Normal Range: Negative) Bilirubin: negative   (Normal Range: Negative) Ketone: trace (5)   (Normal Range: Negative) Spec. Gravity: 1.025   (Normal Range: 1.003-1.035) Blood: large   (Normal Range:  Negative) pH: 5.5   (Normal Range: 5.0-8.0) Protein: 30   (Normal Range: Negative) Urobilinogen: 0.2   (Normal Range: 0-1) Nitrite: negative   (Normal Range: Negative) Leukocyte Esterace: moderate   (Normal Range: Negative)

## 2011-01-15 NOTE — Progress Notes (Signed)
Summary: Pt is req that Gina call back  Phone Note Call from Patient Call back at [facility] Phone [phone]   Caller: Patient Summary of Call: Pt called and is req that Gina call back re: status of something that pt had spoken with Dr. Scotty Court about.  Initial call taken by: Lucy Antigua,  September 04, 2010 3:34 PM  Follow-up for Phone Call        pt notified  Follow-up by: Pura Spice, RN,  September 05, 2010 8:32 AM

## 2011-01-15 NOTE — Assessment & Plan Note (Signed)
Summary: uti/pt still having symptoms//cjr   Vital Signs:  Patient profile:   65 year old female Menstrual status:  postmenopausal Height:      63 inches (160.02 cm) Weight:      193 pounds (87.73 kg) O2 Sat:      97 % on Room air Temp:     98.0 degrees F (36.67 degrees C) oral Pulse rate:   91 / minute BP sitting:   110 / 70  (left arm) Cuff size:   large  Vitals Entered By: Josph Macho RMA (September 17, 2010 11:38 AM)  O2 Flow:  Room air CC: UTI- burning and itching still/ CF Is Patient Diabetic? Yes   History of Present Illness: Patient in today for recurrent urinary symptoms. No f/c/hematuria/back or abdominal pain. She started to experience dysuria and vaginal itching over the past several days. No vaginal discharge but she has noticed some increased urinary frequency and urgency. No anorexia/n/v/diarrhea/constipation/CP/palp/SOB.  Current Medications (verified): 1)  Flonase 50 Mcg/act  Susp (Fluticasone Propionate) .... Inhale 1 Puff Two Times A Day 2)  Alrex 0.2 %  Susp (Loteprednol Etabonate) .Marland Kitchen.. 1 Drop Each Eye Twice A Day 3)  Glipizide Xl 5 Mg  Tb24 (Glipizide) .... Take 1 Tablet By Mouth Once A Day 4)  Diazepam 5 Mg  Tabs (Diazepam) .... Three Times A Day 5)  Promethazine Hcl 25 Mg  Tabs (Promethazine Hcl) .... Take 1 Every 4 Hrs As Needed For Nausea 6)  Lipitor 40 Mg  Tabs (Atorvastatin Calcium) .... Once Daily 7)  Desonide 0.05 %  Crea (Desonide) .... Apply Two Times A Day As Needed 8)  Promethazine-Codeine 6.25-10 Mg/35ml  Syrp (Promethazine-Codeine) .... 2 Tsp Q4h As Needed 9)  Lotrel 10-20 Mg  Caps (Amlodipine Besy-Benazepril Hcl) .Marland Kitchen.. 1 Once Daily 10)  Hydrochlorothiazide 25 Mg  Tabs (Hydrochlorothiazide) .... Take 1 Tab Bid 11)  Allopurinol 100 Mg Tabs (Allopurinol) .Marland Kitchen.. 1 By Mouth Two Times A Day 12)  Diclofenac Sodium 75 Mg Tbec (Diclofenac Sodium) .Marland Kitchen.. 1 By Mouth Two Times A Day Pc  Allergies (verified): 1)  ! Phenobarbital (Phenobarbital)  Past  History:  Past medical history reviewed for relevance to current acute and chronic problems. Social history (including risk factors) reviewed for relevance to current acute and chronic problems.  Past Medical History: Reviewed history from 07/21/2007 and no changes required. Allergic rhinitis Diabetes mellitus, type II Hyperlipidemia Hypertension menopause  2002  Social History: Reviewed history and no changes required.  Review of Systems      See HPI  Physical Exam  General:  Well-developed,well-nourished,in no acute distress; alert,appropriate and cooperative throughout examination Head:  Normocephalic and atraumatic without obvious abnormalities. No apparent alopecia or balding. Mouth:  Oral mucosa and oropharynx without lesions or exudates.  Teeth in good repair. Neck:  No deformities, masses, or tenderness noted. Lungs:  Normal respiratory effort, chest expands symmetrically. Lungs are clear to auscultation, no crackles or wheezes. Heart:  Normal rate and regular rhythm. S1 and S2 normal without gallop, murmur, click, rub or other extra sounds. Abdomen:  Bowel sounds positive,abdomen soft and non-tender without masses, organomegaly or hernias noted. Extremities:  No clubbing, cyanosis, edema, or deformity noted  Neurologic:  2 + DTRs b/l, gross motor and sensation intact    Impression & Recommendations:  Problem # 1:  ACUTE CYSTITIS (ICD-595.0)  The following medications were removed from the medication list:    Macrobid 100 Mg Caps (Nitrofurantoin monohyd macro) .Marland Kitchen... 1 tab by mouth two  times a day x 5 day    Pyridium 200 Mg Tabs (Phenazopyridine hcl) .Marland Kitchen... 1 tab by mouth three times a day x 2 days Her updated medication list for this problem includes:    Cipro 250 Mg Tabs (Ciprofloxacin hcl) .Marland Kitchen... 1 tab by mouth two times a day x 7 days  Orders: T-Urine Culture (Spectrum Order) [id number]) UA Dipstick w/o Micro (automated)  ([id number]), will treat based on recent  history and give a course of Fluconazole to counteract the antibiotic due to the new onsest pruritus. Cont on probiotic, push fluids  Problem # 2:  HYPERTENSION (ICD-401.9)  Her updated medication list for this problem includes:    Lotrel 10-20 Mg Caps (Amlodipine besy-benazepril hcl) .Marland Kitchen... 1 once daily    Hydrochlorothiazide 25 Mg Tabs (Hydrochlorothiazide) .Marland Kitchen... Take 1 tab bid well controlled on current meds, no changes  Complete Medication List: 1)  Flonase 50 Mcg/act Susp (Fluticasone propionate) .... Inhale 1 puff two times a day 2)  Alrex 0.2 % Susp (Loteprednol etabonate) .Marland Kitchen.. 1 drop each eye twice a day 3)  Glipizide Xl 5 Mg Tb24 (Glipizide) .... Take 1 tablet by mouth once a day 4)  Diazepam 5 Mg Tabs (Diazepam) .... Three times a day 5)  Promethazine Hcl 25 Mg Tabs (Promethazine hcl) .... Take 1 every 4 hrs as needed for nausea 6)  Lipitor 40 Mg Tabs (Atorvastatin calcium) .... Once daily 7)  Desonide 0.05 % Crea (Desonide) .... Apply two times a day as needed 8)  Promethazine-codeine 6.25-10 Mg/9ml Syrp (Promethazine-codeine) .... 2 tsp q4h as needed 9)  Lotrel 10-20 Mg Caps (Amlodipine besy-benazepril hcl) .Marland Kitchen.. 1 once daily 10)  Hydrochlorothiazide 25 Mg Tabs (Hydrochlorothiazide) .... Take 1 tab bid 11)  Allopurinol 100 Mg Tabs (Allopurinol) .Marland Kitchen.. 1 by mouth two times a day 12)  Diclofenac Sodium 75 Mg Tbec (Diclofenac sodium) .Marland Kitchen.. 1 by mouth two times a day pc 13)  Cipro 250 Mg Tabs (Ciprofloxacin hcl) .Marland Kitchen.. 1 tab by mouth two times a day x 7 days 14)  Fluconazole 150 Mg Tabs (Fluconazole) .Marland Kitchen.. 1 tab by mouth q week x 2 week  Patient Instructions: 1)  Drink plenty of fluids up to 3-4 quarts a day. Cranberry juice is especially recommended in addition to large amounts of water. Avoid caffeine & carbonated drinks, they tend to irritate the bladder, Return in 3-5 days if you're not better: sooner if you're feeling worse.  2)  Take your antibiotic as prescribed until ALL of it is  gone, but stop if you develop a rash or swelling and contact our office as soon as possible.  Prescriptions: FLUCONAZOLE 150 MG TABS (FLUCONAZOLE) 1 tab by mouth q week x 2 week  #2 x 0   Entered and Authorized by:   Danise Edge MD   Signed by:   Danise Edge MD on 09/17/2010   Method used:   Electronically to        [facility] [address] (retail)       [address]       [place], [place]  [address]       Ph: [phone] or [phone]       Fax: [phone]   RxID:   [id number] CIPRO 250 MG TABS (CIPROFLOXACIN HCL) 1 tab by mouth two times a day x 7 days  #14 x 0   Entered and Authorized by:   Danise Edge MD  Signed by:   Danise Edge MD on 09/17/2010   Method used:   Electronically to        [facility] [address] (retail)       [address]       [place], [place]  [address]       Ph: [phone] or [phone]       Fax: [phone]   RxID:   [id number]   Appended Document: Orders Update     Clinical Lists Changes  Observations: Added new observation of COMMENTS: Wynona Canes, CMA  September 17, 2010 4:20 PM  (09/17/2010 16:19) Added new observation of PH URINE: 6.0  (09/17/2010 16:19) Added new observation of SPEC GR URIN: 1.020  (09/17/2010 16:19) Added new observation of APPEARANCE U: Clear  (09/17/2010 16:19) Added new observation of UA COLOR: yellow  (09/17/2010 16:19) Added new observation of WBC DIPSTK U: 1+  (09/17/2010 16:19) Added new observation of NITRITE URN: negative  (09/17/2010 16:19) Added new observation of UROBILINOGEN: 0.2  (09/17/2010 16:19) Added new observation of PROTEIN, URN: negative  (09/17/2010 16:19) Added new observation of BLOOD UR DIP: negative  (09/17/2010 16:19) Added new observation of KETONES URN: negative  (09/17/2010 16:19) Added new observation of BILIRUBIN UR: negative  (09/17/2010 16:19) Added new observation of GLUCOSE, URN: negative  (09/17/2010  16:19)      Laboratory Results   Urine Tests  Date/Time Recieved: September 17, 2010 4:20 PM  Date/Time Reported: September 17, 2010 4:19 PM   Routine Urinalysis   Color: yellow Appearance: Clear Glucose: negative   (Normal Range: Negative) Bilirubin: negative   (Normal Range: Negative) Ketone: negative   (Normal Range: Negative) Spec. Gravity: 1.020   (Normal Range: 1.003-1.035) Blood: negative   (Normal Range: Negative) pH: 6.0   (Normal Range: 5.0-8.0) Protein: negative   (Normal Range: Negative) Urobilinogen: 0.2   (Normal Range: 0-1) Nitrite: negative   (Normal Range: Negative) Leukocyte Esterace: 1+   (Normal Range: Negative)    Comments: Wynona Canes, CMA  September 17, 2010 4:20 PM     Appended Document: Orders Update     Clinical Lists Changes  Orders: Added new Service order of Specimen Handling ([id number]) - Signed

## 2011-01-15 NOTE — Letter (Signed)
Summary: Results Follow-up Letter  [place] [facility]  [address] [place], [place] [address]   Phone: [phone]  Fax: [phone]    08/14/2010  [address] [place], [place]  [address]  Dear Ms. Eyman,   The following are the results of your recent test(s):  Test     Result     Pap Smear    Normal_____YES  Sincerely,      Dr  Raoul Pitch   [place] [facility]

## 2011-01-17 NOTE — Progress Notes (Signed)
  Phone Note Call from Patient Call back at Home Phone [phone]   Caller: Patient Summary of Call: Pain in the arm is 75 percent better but numbness no better.  Looking for further testing or what to do next.  Initial call taken by: [facility] CMA AAMA,  January 03, 2011 4:12 PM  Follow-up for Phone Call        was given medrol dosepak 1/16. shouldn't be finished with med. recommend completing medication first and see what improves. Follow-up by: Edwyna Perfect MD,  January 03, 2011 4:49 PM  Additional Follow-up for Phone Call Additional follow up Details #1::        Notified pt. Additional Follow-up by: Lynann Beaver CMA AAMA,  January 04, 2011 8:33 AM

## 2011-01-17 NOTE — Assessment & Plan Note (Signed)
Summary: arm pain/ccm   Vital Signs:  Patient profile:   65 year old female Menstrual status:  postmenopausal Weight:      199 pounds Pulse rate:   102 / minute BP sitting:   148 / 70  (right arm)  Vitals Entered By: Kyung Rudd, CMA (December 31, 2010 9:29 AM) CC: pt c/o left arm pain that radiates from shoulder to finger tips with numbness in the pinky finger x 1 wk...pt gets a little relief from heat compresses   CC:  pt c/o left arm pain that radiates from shoulder to finger tips with numbness in the pinky finger x 1 wk...pt gets a little relief from heat compresses.  History of Present Illness: Patient presents to clinic as a workin for evaluation of arm pain. Notes 1wk h/o left upper arm pain radiating to the wrist. Has associated 5th finger numbness. Denies injury or UE weakness. Taking voltaren once daily without significant improvement. Recent URI symptoms with cough and notes spontaneous improvement after 8 days. No f/c. H/o DM reviewed under good control without hyperglycemia or hypoglycemia.  Current Medications (verified): 1)  Flonase 50 Mcg/act  Susp (Fluticasone Propionate) .... Inhale 1 Puff Two Times A Day 2)  Alrex 0.2 %  Susp (Loteprednol Etabonate) .Marland Kitchen.. 1 Drop Each Eye Twice A Day 3)  Glipizide Xl 5 Mg  Tb24 (Glipizide) .... Take 1 Tablet By Mouth Once A Day 4)  Diazepam 5 Mg  Tabs (Diazepam) .... Three Times A Day 5)  Promethazine Hcl 25 Mg  Tabs (Promethazine Hcl) .... Take 1 Every 4 Hrs As Needed For Nausea 6)  Lipitor 40 Mg  Tabs (Atorvastatin Calcium) .... Once Daily 7)  Desonide 0.05 %  Crea (Desonide) .... Apply Two Times A Day As Needed 8)  Promethazine-Codeine 6.25-10 Mg/90ml  Syrp (Promethazine-Codeine) .... 2 Tsp Q4h As Needed 9)  Lotrel 10-20 Mg  Caps (Amlodipine Besy-Benazepril Hcl) .Marland Kitchen.. 1 Once Daily 10)  Hydrochlorothiazide 25 Mg  Tabs (Hydrochlorothiazide) .... Take 1 Tab Bid 11)  Allopurinol 100 Mg Tabs (Allopurinol) .Marland Kitchen.. 1 By Mouth Two Times A  Day 12)  Diclofenac Sodium 75 Mg Tbec (Diclofenac Sodium) .Marland Kitchen.. 1 By Mouth Two Times A Day Pc  Allergies (verified): 1)  ! Phenobarbital (Phenobarbital)  Past History:  Past medical, surgical, family and social histories (including risk factors) reviewed, and no changes noted (except as noted below).  Past Medical History: Reviewed history from 07/21/2007 and no changes required. Allergic rhinitis Diabetes mellitus, type II Hyperlipidemia Hypertension menopause  2002  Family History: Reviewed history and no changes required.  Social History: Reviewed history and no changes required.  Review of Systems      See HPI  Physical Exam  General:  Well-developed,well-nourished,in no acute distress; alert,appropriate and cooperative throughout examination Head:  Normocephalic and atraumatic without obvious abnormalities. No apparent alopecia or balding. Eyes:  pupils equal, pupils round, corneas and lenses clear, and no injection.   Ears:  no external deformities.   Nose:  no external deformity.   Lungs:  Normal respiratory effort, chest expands symmetrically. Lungs are clear to auscultation, no crackles or wheezes. Msk:  FROM left shoulder, elbow and wrist. FROM neck. No midline cervical tenderness or bony abn. No paraspinal muscle spasm. bilateral upper extremity strength 5/5. Neurologic:  alert & oriented X3 and gait normal.     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 Begin medrol dosepak. Monitor fsbs carefully for hyperglycemia. Followup  if no improvement or worsening.  Complete Medication List: 1)  Flonase 50 Mcg/act Susp (Fluticasone propionate) .... Inhale 1 puff two times a day 2)  Alrex 0.2 % Susp (Loteprednol etabonate) .Marland Kitchen.. 1 drop each eye twice a day 3)  Glipizide Xl 5 Mg Tb24 (Glipizide) .... Take 1 tablet by mouth once a day 4)  Diazepam 5 Mg Tabs (Diazepam) .... Three times a day 5)   Promethazine Hcl 25 Mg Tabs (Promethazine hcl) .... Take 1 every 4 hrs as needed for nausea 6)  Lipitor 40 Mg Tabs (Atorvastatin calcium) .... Once daily 7)  Desonide 0.05 % Crea (Desonide) .... Apply two times a day as needed 8)  Promethazine-codeine 6.25-10 Mg/57ml Syrp (Promethazine-codeine) .... 2 tsp q4h as needed 9)  Lotrel 10-20 Mg Caps (Amlodipine besy-benazepril hcl) .Marland Kitchen.. 1 once daily 10)  Hydrochlorothiazide 25 Mg Tabs (Hydrochlorothiazide) .... Take 1 tab bid 11)  Allopurinol 100 Mg Tabs (Allopurinol) .Marland Kitchen.. 1 by mouth two times a day 12)  Diclofenac Sodium 75 Mg Tbec (Diclofenac sodium) .Marland Kitchen.. 1 by mouth two times a day pc 13)  Medrol (pak) 4 Mg Tabs (Methylprednisolone) .... As directed Prescriptions: MEDROL (PAK) 4 MG TABS (METHYLPREDNISOLONE) as directed  #1 x 0   Entered and Authorized by:   Edwyna Perfect MD   Signed by:   Edwyna Perfect MD on 12/31/2010   Method used:   Electronically to        [facility] [address] (retail)       [address]       [place], [place]  [address]       Ph: [phone] or [phone]       Fax: [phone]   RxID:   [id number]    Orders Added: 1)  Est. Patient Level III [[address]]

## 2011-02-19 ENCOUNTER — Ambulatory Visit (INDEPENDENT_AMBULATORY_CARE_PROVIDER_SITE_OTHER)
Admission: RE | Admit: 2011-02-19 | Discharge: 2011-02-19 | Disposition: A | Discharge status: Home / Self Care | Payer: 59 | Source: Ambulatory Visit | Attending: Family Medicine | Admitting: Family Medicine

## 2011-02-19 ENCOUNTER — Encounter: Payer: Self-pay | Admitting: Family Medicine

## 2011-02-19 ENCOUNTER — Ambulatory Visit (INDEPENDENT_AMBULATORY_CARE_PROVIDER_SITE_OTHER): Payer: 59 | Admitting: Family Medicine

## 2011-02-19 DIAGNOSIS — M19019 Primary osteoarthritis, unspecified shoulder: Secondary | ICD-10-CM

## 2011-02-19 DIAGNOSIS — M25519 Pain in unspecified shoulder: Secondary | ICD-10-CM

## 2011-02-19 MED ORDER — PROMETHAZINE-CODEINE 6.25-10 MG/5ML PO SYRP: 5.0000 mL | ORAL_SOLUTION | ORAL | Status: DC | PRN

## 2011-02-19 NOTE — Patient Instructions (Signed)
Will get xray left shoulder, refillprednisone, decrease initial daily dose to 20 mg bid then decrease Apply heat 20-30 mintes 2-3 times per day, after finishinf prednisone will restart diclofenac

## 2011-02-19 NOTE — Progress Notes (Signed)
  Subjective:    Patient ID: Deborah Bryant, female    DOB: [birth year], 65 y.o.   MRN: [id number] This 65 yr old white female complains of left shoulder pain with radiation to elbow and pain but now shoulder pain and numbness in 4th& 5th fingers which is persisting but has improved, was seen by Dr. Rodena Medin  On 1/16 on medrol not improved then I txed with prednisone decreasing dosage , improve but not completely.Instructed to return. HPI    Review of Systems  Constitutional: Negative.   HENT: Negative.   Eyes: Negative.   Respiratory: Positive for cough.   Cardiovascular: Negative.   Gastrointestinal: Negative.   Genitourinary: Negative.   Musculoskeletal: Positive for arthralgias.  Neurological: Positive for numbness.  Hematological: Negative.   Psychiatric/Behavioral: Negative.        Objective:   Physical Examcrvical spine negative , no tenderness, no limitation of movement Shoulder tenderness posterior aspect shoulder, no signs of  Bursitis, full range of motion Examination elbow negative        Assessment & Plan:  To xray shoulder, refill prednisone, apply heat prn To refillpromethazine expectorant

## 2011-03-19 ENCOUNTER — Ambulatory Visit: Payer: Self-pay | Admitting: Family Medicine

## 2011-05-03 NOTE — Assessment & Plan Note (Signed)
[facility]                         GASTROENTEROLOGY OFFICE NOTE   Deborah, Bryant                         MRN:          [id number]  DATE:11/25/2006                            DOB:          [birth year]    CHIEF COMPLAINT:  A 65 year old white female who is self-referred for  hematochezia and lower abdominal pain.   HISTORY OF PRESENT ILLNESS:  Deborah Bryant has a past history of mild  constipation but this has been relatively inactive for the past several  years.  She underwent colonoscopy in April of 2002 which revealed small  internal and external hemorrhoids.  On November 13, 2006, she had the  acute onset of crampy lower abdominal pain associated with watery  diarrhea which soon became bloody diarrhea and then frequent bloody  bowel movements over the next 24-48 hours.  The diarrhea resolved in 24  hours, the abdominal pain resolved over the course of several days and  the rectal bleeding resolved several days after that.  The bleeding was  less and less each day and was described as bright red in color.  She  had no nausea, vomiting, fevers or chills.  At this point, her bowel  movements have returned to completely normal with no ongoing bleeding.  There is no family history of colon cancer, colon polyps or inflammatory  bowel disease.  She has had no recent medication changes.   PAST MEDICAL HISTORY:  1. Hypertension.  2. Hyperlipidemia.  3. History of palpitations.  4. Status post bilateral tubal ligation.  5. Internal and external hemorrhoids.   CURRENT MEDICATIONS:  Listed on the chart, updated and reviewed.   MEDICATION ALLERGIES:  None known.   SOCIAL HISTORY AND REVIEW OF SYSTEMS:  Per the handwritten evaluation  form.   PHYSICAL EXAMINATION:  No acute distress.  Height 5 feet 3-1/2 inches.  Weight 184 pounds.  Blood pressure is 122/68, pulse 88 and regular.  HEENT EXAM:  Anicteric sclerae.  Oropharynx clear.  CHEST:  Clear to  auscultation bilaterally.  CARDIAC:  Regular rate and rhythm without murmurs appreciated.  ABDOMEN:  Soft, nontender, and nondistended.  Normal active bowel  sounds.  No palpable organomegaly, masses or hernias.  RECTAL:  Deferred at the time of colonoscopy.  EXTREMITIES:  Without clubbing, cyanosis, or edema.  NEUROLOGICAL:  Alert and oriented x3.  Grossly nonfocal.   ASSESSMENT/PLAN:  Suspect acute colitis-rule out ischemic versus  infectious etiology.  Less likely colorectal neoplasms and inflammatory  bowel disease.  Risks, benefits and alternatives to  colonoscopy with possible biopsy and possible polypectomy discussed with  the patient and she consented to proceed.  This will be scheduled  electively.     Venita Lick. Russella Dar, MD, [facility]  Electronically Signed    MTS/MedQ  DD: 11/27/2006  DT: 11/28/2006  Job #: [id number]

## 2011-05-16 ENCOUNTER — Other Ambulatory Visit: Payer: Self-pay | Admitting: Family Medicine

## 2011-05-16 MED ORDER — PROMETHAZINE-CODEINE 6.25-10 MG/5ML PO SYRP: ORAL_SOLUTION | ORAL | Status: DC

## 2011-08-06 ENCOUNTER — Encounter: Payer: Self-pay | Admitting: Family Medicine

## 2011-08-06 ENCOUNTER — Other Ambulatory Visit (INDEPENDENT_AMBULATORY_CARE_PROVIDER_SITE_OTHER): Payer: 59

## 2011-08-06 DIAGNOSIS — Z0000 Encounter for general adult medical examination without abnormal findings: Secondary | ICD-10-CM

## 2011-08-06 LAB — CBC WITH DIFFERENTIAL/PLATELET
Basophils Absolute: 0.1 10*3/uL (ref 0.0–0.1)
Eosinophils Relative: 0.9 % (ref 0.0–5.0)
HCT: 40.2 % (ref 36.0–46.0)
Lymphocytes Relative: 16.9 % (ref 12.0–46.0)
Lymphs Abs: 1.6 10*3/uL (ref 0.7–4.0)
Monocytes Relative: 5.9 % (ref 3.0–12.0)
Neutrophils Relative %: 75.4 % (ref 43.0–77.0)
Platelets: 287 10*3/uL (ref 150.0–400.0)
RDW: 14.9 % — ABNORMAL HIGH (ref 11.5–14.6)
WBC: 9.2 10*3/uL (ref 4.5–10.5)

## 2011-08-06 LAB — POCT URINALYSIS DIPSTICK
Blood, UA: NEGATIVE
Glucose, UA: NEGATIVE
Ketones, UA: NEGATIVE
Spec Grav, UA: 1.015

## 2011-08-06 LAB — BASIC METABOLIC PANEL
BUN: 17 mg/dL (ref 6–23)
Calcium: 9.1 mg/dL (ref 8.4–10.5)
Creatinine, Ser: 0.9 mg/dL (ref 0.4–1.2)
GFR: 71.35 mL/min (ref >60.00)
Glucose, Bld: 129 mg/dL — ABNORMAL HIGH (ref 70–99)

## 2011-08-06 LAB — HEPATIC FUNCTION PANEL
Albumin: 4.3 g/dL (ref 3.5–5.2)
Alkaline Phosphatase: 105 U/L (ref 39–117)
Total Protein: 7.4 g/dL (ref 6.0–8.3)

## 2011-08-06 LAB — LIPID PANEL
Cholesterol: 144 mg/dL (ref 0–200)
Triglycerides: 237 mg/dL — ABNORMAL HIGH (ref 0.0–149.0)
VLDL: 47.4 mg/dL — ABNORMAL HIGH (ref 0.0–40.0)

## 2011-08-06 LAB — LDL CHOLESTEROL, DIRECT: Direct LDL: 61.9 mg/dL

## 2011-08-06 LAB — TSH: TSH: 4.95 u[IU]/mL (ref 0.35–5.50)

## 2011-08-06 LAB — MICROALBUMIN / CREATININE URINE RATIO: Creatinine,U: 141.9 mg/dL

## 2011-08-13 ENCOUNTER — Ambulatory Visit (INDEPENDENT_AMBULATORY_CARE_PROVIDER_SITE_OTHER): Payer: Medicare Other | Admitting: Family Medicine

## 2011-08-13 ENCOUNTER — Other Ambulatory Visit: Payer: Self-pay | Admitting: Family Medicine

## 2011-08-13 ENCOUNTER — Encounter: Payer: Self-pay | Admitting: Family Medicine

## 2011-08-13 VITALS — BP 110/60 | Temp 97.8°F | Ht 62.5 in | Wt 190.0 lb

## 2011-08-13 DIAGNOSIS — R911 Solitary pulmonary nodule: Secondary | ICD-10-CM

## 2011-08-13 DIAGNOSIS — E669 Obesity, unspecified: Secondary | ICD-10-CM

## 2011-08-13 DIAGNOSIS — I10 Essential (primary) hypertension: Secondary | ICD-10-CM

## 2011-08-13 DIAGNOSIS — Z23 Encounter for immunization: Secondary | ICD-10-CM

## 2011-08-13 DIAGNOSIS — E785 Hyperlipidemia, unspecified: Secondary | ICD-10-CM

## 2011-08-13 DIAGNOSIS — E6609 Other obesity due to excess calories: Secondary | ICD-10-CM

## 2011-08-13 DIAGNOSIS — Z0000 Encounter for general adult medical examination without abnormal findings: Secondary | ICD-10-CM

## 2011-08-13 DIAGNOSIS — M1990 Unspecified osteoarthritis, unspecified site: Secondary | ICD-10-CM

## 2011-08-13 DIAGNOSIS — J309 Allergic rhinitis, unspecified: Secondary | ICD-10-CM

## 2011-08-13 DIAGNOSIS — J984 Other disorders of lung: Secondary | ICD-10-CM

## 2011-08-13 DIAGNOSIS — E119 Type 2 diabetes mellitus without complications: Secondary | ICD-10-CM

## 2011-08-13 MED ORDER — FLUTICASONE PROPIONATE 50 MCG/ACT NA SUSP: 2.0000 | Freq: Two times a day (BID) | NASAL | Status: DC

## 2011-08-13 MED ORDER — AMLODIPINE BESY-BENAZEPRIL HCL 10-20 MG PO CAPS: 1.0000 | ORAL_CAPSULE | Freq: Every day | ORAL | Status: DC

## 2011-08-13 MED ORDER — ALLOPURINOL 100 MG PO TABS: 100.0000 mg | ORAL_TABLET | Freq: Two times a day (BID) | ORAL | Status: DC

## 2011-08-13 MED ORDER — DICLOFENAC SODIUM 75 MG PO TBEC: 75.0000 mg | DELAYED_RELEASE_TABLET | Freq: Two times a day (BID) | ORAL | Status: DC

## 2011-08-13 MED ORDER — PROMETHAZINE-CODEINE 6.25-10 MG/5ML PO SYRP: ORAL_SOLUTION | ORAL | Status: DC

## 2011-08-13 MED ORDER — DIAZEPAM 5 MG PO TABS: 5.0000 mg | ORAL_TABLET | Freq: Three times a day (TID) | ORAL | Status: DC

## 2011-08-13 MED ORDER — PROMETHAZINE HCL 25 MG PO TABS: 25.0000 mg | ORAL_TABLET | ORAL | Status: DC | PRN

## 2011-08-13 MED ORDER — ATORVASTATIN CALCIUM 40 MG PO TABS: 40.0000 mg | ORAL_TABLET | Freq: Every day | ORAL | Status: DC

## 2011-08-13 MED ORDER — LOTEPREDNOL ETABONATE 0.2 % OP SUSP: 1.0000 [drp] | Freq: Two times a day (BID) | OPHTHALMIC | Status: DC

## 2011-08-13 MED ORDER — HYDROCHLOROTHIAZIDE 25 MG PO TABS: 25.0000 mg | ORAL_TABLET | Freq: Two times a day (BID) | ORAL | Status: DC

## 2011-08-13 MED ORDER — GLIPIZIDE 5 MG PO TABS: 5.0000 mg | ORAL_TABLET | ORAL | Status: DC

## 2011-08-13 NOTE — Patient Instructions (Signed)
Physical examination reveals a healthy female in the she continued to watch her weight and age usual recommend smoking cessation Deborah Bryant will call you regarding your CT scan Verypleased with the lab studies Have a refill medications

## 2011-08-13 NOTE — Progress Notes (Signed)
  Subjective:    Patient ID: Deborah Bryant, female    DOB: [birth year], 65 y.o.   MRN: [id number] This 65 year old white married female is in for a routine physical examination. She had a Pap smear last year and will not be done this year. She relates she has been doing very well going to the  gym on a regular basis. Her primary complaints is that of arthritis of multiple joints on the migratory bases but no evidence of abnormalities. His history of nodule in right chest I feel that she should have a repeat CT scan needs a TDap, continues to smoke one pack per day continues to have a regular cough no fever and nonproductive needs to continue on Lipitor for hyperlipidemia as well as takes diazepam when necessary for stress and insomnia. No urinary symptoms also no diarrhea or constipation no headache HPI    Review of Systems see history of present illness     Objective:   Physical Exam the patient is a well-developed well-nourished obese white female in no distress pleasant and cooperative HEENT nasal mucosa appears normal today ears clear throat negative carotid pulses good thyroid nonpalpable no anterior cervical lymphadenopathy Lungs rhonchi minimal and basis bilaterally no rales no wheezing no dullness Breast old no masses no tenderness nipples erect negative axilla clear mammogram negative Heart no evidence of cardiomegaly heart sounds are good without murmurs regular rhythm peripheral pulses are good and equal bilaterally Abdomen obese liver spleen kidneys are nonpalpable no masses felt bowel sounds normal Pelvic and rectal exam not done Extremities no abnormalities noted no tenderness Neurological exam          Assessment & Plan:  Routine physical examination finds a healthy obese female with the following problem Exogenous  Obesity  the to continue weight reduction diet Diabetes is well controlled continue regular medications and decrease car Hypertension well controlled continue  same treatment Osteoarthritis increased diclofenac 75 mg twice a day after meals History of nodule right upper lobe to order CT scan Anxiety and insomnia used diazepam 5 mg 3 times a day when necessary

## 2011-09-03 ENCOUNTER — Ambulatory Visit (INDEPENDENT_AMBULATORY_CARE_PROVIDER_SITE_OTHER)
Admission: RE | Admit: 2011-09-03 | Discharge: 2011-09-03 | Disposition: A | Discharge status: Home / Self Care | Payer: Medicare Other | Source: Ambulatory Visit | Attending: Family Medicine | Admitting: Family Medicine

## 2011-09-03 DIAGNOSIS — J984 Other disorders of lung: Secondary | ICD-10-CM

## 2011-09-03 DIAGNOSIS — R911 Solitary pulmonary nodule: Secondary | ICD-10-CM

## 2011-09-10 ENCOUNTER — Telehealth: Payer: Self-pay | Admitting: Family Medicine

## 2011-09-10 NOTE — Telephone Encounter (Signed)
Pt would like ct scan results

## 2011-09-13 ENCOUNTER — Telehealth: Payer: Self-pay

## 2011-09-13 MED ORDER — LOTEPREDNOL ETABONATE 0.5 % OP SUSP: 1.0000 [drp] | Freq: Two times a day (BID) | OPHTHALMIC | Status: AC

## 2011-09-13 MED ORDER — GLIPIZIDE ER 5 MG PO TB24: 5.0000 mg | ORAL_TABLET | Freq: Every day | ORAL | Status: DC

## 2011-09-13 NOTE — Telephone Encounter (Signed)
Pt called to clarify prescriptions for [facility].  Pt states she is taking Glipizide XL and Lotemax 0.5 % solution.    [facility] and spoke with Dorene Grebe Scientist, research (physical sciences)) and verified pt's Glipizde XL. Per Dorene Grebe pt's rx is being filled.   Pt is aware.

## 2011-09-13 NOTE — Telephone Encounter (Signed)
Per Dr.Stafford read ct scan to pt and make aware that he will discuss with her when he returns.

## 2011-10-02 NOTE — Progress Notes (Signed)
Pt aware.

## 2011-10-07 ENCOUNTER — Telehealth: Payer: Self-pay | Admitting: *Deleted

## 2011-10-07 NOTE — Telephone Encounter (Signed)
Patient requesting call back after hearing that Dr Scotty Court is out 'indefinitely' as she was going to discuss Rx given to her in August that she has yet to fill at her husband's upcoming appointment.  Called patient for more information: 1) Given printed Rx[s] for [facility] at 07/2011 CPE w/Dr Scotty Court, Diazepam not covered under formulary plan but could not be returned to patient due to being printed on sheet w/other Rx[s] being filled. Patient requesting a new Rx for Diazepam eith printed to be p/u or phoned in to [facility] on [address]. 2) Had CT chest 09/03/11 and was given results but is concerned that new nodules were found and she is a smoker. Would like confirmation that recommendation to re-check in 12 months is not too long of a period before having next scan. Please advise patient of recommendation and on prescription.

## 2011-10-07 NOTE — Telephone Encounter (Signed)
Per Doy Hutching forwarding message to MD:  Call Documentation     Deborah Bryant, [facility] 10/07/2011 4:43 PM Signed  Patient requesting call back after hearing that Dr Scotty Court is out 'indefinitely' as she was going to discuss Rx given to her in August that she has yet to fill at her husband's upcoming appointment.  Called patient for more information:  1) Given printed Rx[s] for [facility] at 07/2011 CPE w/Dr Scotty Court, Diazepam not covered under formulary plan but could not be returned to patient due to being printed on sheet w/other Rx[s] being filled. Patient requesting a new Rx for Diazepam eith printed to be p/u or phoned in to [facility] on [address].  2) Had CT chest 09/03/11 and was given results but is concerned that new nodules were found and she is a smoker. Would like confirmation that recommendation to re-check in 12 months is not too long of a period before having next scan.  Please advise patient of recommendation and on prescription.

## 2011-10-07 NOTE — Telephone Encounter (Signed)
Confirm that diazepam not sent to her from [facility].  If not, refill for one month and schedule follow up with another provider to discuss CT results.

## 2011-10-09 MED ORDER — DIAZEPAM 5 MG PO TABS: 5.0000 mg | ORAL_TABLET | Freq: Three times a day (TID) | ORAL | Status: DC

## 2011-10-09 NOTE — Telephone Encounter (Signed)
Conformed with [facility] Diazepam was not filled as it is not covered.  I called in aone month supply to pt local pharmacy #[id number] with 0 refills.  VM left at pt home to inform her as well as schedule return OV to discuss CT result with another provider

## 2011-10-14 ENCOUNTER — Encounter: Payer: Self-pay | Admitting: Family Medicine

## 2011-10-14 ENCOUNTER — Ambulatory Visit (INDEPENDENT_AMBULATORY_CARE_PROVIDER_SITE_OTHER): Payer: Medicare Other | Admitting: Family Medicine

## 2011-10-14 VITALS — BP 136/68 | HR 92 | Temp 97.7°F | Wt 189.0 lb

## 2011-10-14 DIAGNOSIS — F17200 Nicotine dependence, unspecified, uncomplicated: Secondary | ICD-10-CM

## 2011-10-14 DIAGNOSIS — R918 Other nonspecific abnormal finding of lung field: Secondary | ICD-10-CM

## 2011-10-14 DIAGNOSIS — F419 Anxiety disorder, unspecified: Secondary | ICD-10-CM

## 2011-10-14 DIAGNOSIS — F411 Generalized anxiety disorder: Secondary | ICD-10-CM

## 2011-10-14 MED ORDER — DIAZEPAM 5 MG PO TABS: 5.0000 mg | ORAL_TABLET | Freq: Three times a day (TID) | ORAL | Status: DC

## 2011-10-14 NOTE — Progress Notes (Signed)
  Subjective:    Patient ID: Karema Tocci, female    DOB: [birth year], 65 y.o.   MRN: [id number]  HPI This is a former patient of Dr. Charmian Muff who is here to discuss a chest CT taken on 09-03-11 to follow up a known nodule in the right upper lobe. This scan showed no change in this nodule since 2008, but 3 new nodules were seen on the left lung. These were felt to be benign and a 12 month follow p scan was recommended by Radiology. She asks my advice. She continues to smoke. She wants to quit now. Needs refills on Valium.    Review of Systems  Constitutional: Negative.   Respiratory: Negative.   Cardiovascular: Negative.        Objective:   Physical Exam  Constitutional: She appears well-developed and well-nourished.  Cardiovascular: Normal rate, regular rhythm, normal heart sounds and intact distal pulses.   Pulmonary/Chest: Effort normal and breath sounds normal.          Assessment & Plan:  Refilled Valium. Encouraged her to quit smoking. She will try nicotine gum. We agreed to get a 6 month follow up chest CT rather than waiting a full year.

## 2011-11-11 ENCOUNTER — Ambulatory Visit (INDEPENDENT_AMBULATORY_CARE_PROVIDER_SITE_OTHER): Payer: Medicare Other | Admitting: Internal Medicine

## 2011-11-11 ENCOUNTER — Encounter: Payer: Self-pay | Admitting: Internal Medicine

## 2011-11-11 VITALS — BP 120/70 | HR 88 | Temp 97.9°F | Wt 187.0 lb

## 2011-11-11 DIAGNOSIS — J069 Acute upper respiratory infection, unspecified: Secondary | ICD-10-CM

## 2011-11-11 DIAGNOSIS — F17200 Nicotine dependence, unspecified, uncomplicated: Secondary | ICD-10-CM

## 2011-11-11 DIAGNOSIS — R300 Dysuria: Secondary | ICD-10-CM

## 2011-11-11 DIAGNOSIS — J329 Chronic sinusitis, unspecified: Secondary | ICD-10-CM

## 2011-11-11 MED ORDER — AMOXICILLIN-POT CLAVULANATE 875-125 MG PO TABS: 1.0000 | ORAL_TABLET | Freq: Two times a day (BID) | ORAL | Status: AC

## 2011-11-11 NOTE — Progress Notes (Signed)
Subjective:    Patient ID: Deborah Bryant, female    DOB: [birth year], 65 y.o.   MRN: [id number]  HPI Patient comes in today for SDA  For acute problem evaluation. See above 1 week hx of rt sx as above begin sore throat and then  cough congestion and sinus pressure . Throat better but lots of drainage.    HA some face tenderness.  Smoking : trying to quit  Review of Systems Has allergy and using flonase    No fever  No cp otherwise > wheezing or not  No bleeding NVD Past history family history social history reviewed in the electronic medical record. Past Medical History  Diagnosis Date  . Allergic rhinitis   . Diabetes mellitus   . Hyperlipidemia   . Hypertension   . Arthritis   . Menopause   . Cough     History   Social History  . Marital Status: Married    Spouse Name: N/A    Number of Children: N/A  . Years of Education: N/A   Occupational History  . Not on file.   Social History Main Topics  . Smoking status: Current Everyday Smoker -- 1.0 packs/day for 1 years    Types: Cigarettes  . Smokeless tobacco: Never Used  . Alcohol Use: 4.2 oz/week    7 Glasses of wine per week  . Drug Use: No  . Sexually Active: Yes    Birth Control/ Protection: Post-menopausal   Other Topics Concern  . Not on file   Social History Narrative  . No narrative on file  See HPI    No past surgical history on file.  Family History  Problem Relation Age of Onset  . Alcohol abuse Mother   . Heart disease Father   . Heart disease Brother     Allergies  Allergen Reactions  . Phenobarbital     REACTION: unspecified    Current Outpatient Prescriptions on File Prior to Visit  Medication Sig Dispense Refill  . allopurinol (ZYLOPRIM) 100 MG tablet Take 1 tablet (100 mg total) by mouth 2 (two) times daily.  180 tablet  3  . amLODipine-benazepril (LOTREL) 10-20 MG per capsule Take 1 capsule by mouth daily.  90 capsule  3  . atorvastatin (LIPITOR) 40 MG tablet Take 1 tablet (40 mg  total) by mouth daily.  90 tablet  3  . diazepam (VALIUM) 5 MG tablet Take 1 tablet (5 mg total) by mouth 3 (three) times daily.  90 tablet  5  . diclofenac (VOLTAREN) 75 MG EC tablet Take 1 tablet (75 mg total) by mouth 2 (two) times daily. One by mouth two times a day pc  180 tablet  3  . fluticasone (FLONASE) 50 MCG/ACT nasal spray Place 2 sprays into the nose 2 (two) times daily.  48 g  3  . glipiZIDE (GLUCOTROL XL) 5 MG 24 hr tablet Take 1 tablet (5 mg total) by mouth daily.  90 tablet  3  . hydrochlorothiazide 25 MG tablet Take 1 tablet (25 mg total) by mouth 2 (two) times daily.  180 tablet  3  . promethazine (PHENERGAN) 25 MG tablet Take 1 tablet (25 mg total) by mouth every 4 (four) hours as needed.  90 tablet  3  . promethazine-codeine (PHENERGAN WITH CODEINE) 6.25-10 MG/5ML syrup 2 tsp q4h as needed for cough  480 mL  5    BP 120/70  Pulse 88  Temp(Src) 97.9 F (36.6 C) (Oral)  Wt 187  lb (84.823 kg)  SpO2 98%       Objective:   Physical Exam WDWN in NAD  quiet respirations; mildly congested  somewhat hoarse. Non toxic . HEENT: Normocephalic ;atraumatic , Eyes;  PERRL, EOMs  Full, lids and conjunctiva clear,,Ears: no deformities, canals nl, TM landmarks normal, Nose: no deformity or discharge but congested;face minimally tender left maxilla  Mouth : OP clear without lesion or edema .  1 + redness  Neck: Supple without adenopathy or masses or bruits Chest:  Clear to A&P without rales or rhonchi  except  ocss exp wheeze.  CV:  S1-S2 no gallops or murmurs peripheral perfusion is normal Skin :nl perfusion and no acute rashes  Neuro grossly normal      Assessment & Plan:  URI  Sinusitis could be viral  But if prolonged can add antibiotic  Tobacco   encourage in her efforts to dc  Hx of pulm nodules follow

## 2011-11-11 NOTE — Patient Instructions (Signed)
This is still probable  viral infection  But if sinus pain   Continues  Then can add antibiotic .  At the 12- 14 days mark.  Continue saline   Washes.   Continue stop tobacco.

## 2011-11-28 DIAGNOSIS — J329 Chronic sinusitis, unspecified: Secondary | ICD-10-CM | POA: Insufficient documentation

## 2011-11-28 DIAGNOSIS — J069 Acute upper respiratory infection, unspecified: Secondary | ICD-10-CM | POA: Insufficient documentation

## 2011-12-23 ENCOUNTER — Encounter: Payer: Self-pay | Admitting: Gastroenterology

## 2012-01-01 ENCOUNTER — Encounter: Payer: Self-pay | Admitting: Family Medicine

## 2012-01-01 ENCOUNTER — Ambulatory Visit (INDEPENDENT_AMBULATORY_CARE_PROVIDER_SITE_OTHER): Payer: Medicare Other | Admitting: Family Medicine

## 2012-01-01 DIAGNOSIS — R350 Frequency of micturition: Secondary | ICD-10-CM

## 2012-01-01 DIAGNOSIS — N760 Acute vaginitis: Secondary | ICD-10-CM

## 2012-01-01 LAB — POCT URINALYSIS DIPSTICK
Bilirubin, UA: NEGATIVE
Glucose, UA: NEGATIVE
Urobilinogen, UA: 0.2

## 2012-01-01 MED ORDER — TERCONAZOLE 0.4 % VA CREA: 1.0000 | TOPICAL_CREAM | Freq: Every day | VAGINAL | Status: AC

## 2012-01-01 NOTE — Progress Notes (Signed)
  Subjective:    Patient ID: Deborah Bryant, female    DOB: 05-10-1946, 66 y.o.   MRN: [id number]  HPI Here for one week of itching and burning in the vagina and around the perineum. No DC or odor. No urinary symptoms. Vagisil did not help.    Review of Systems  Constitutional: Negative.   Gastrointestinal: Negative.   Genitourinary: Negative.        Objective:   Physical Exam  Constitutional: She appears well-developed and well-nourished.          Assessment & Plan:  Use Terazol cream for 7 days. Apply some into the vagina and some externally

## 2012-01-08 ENCOUNTER — Encounter: Payer: Self-pay | Admitting: Gastroenterology

## 2012-01-08 ENCOUNTER — Ambulatory Visit (AMBULATORY_SURGERY_CENTER): Payer: Medicare Other | Admitting: *Deleted

## 2012-01-08 VITALS — Ht 63.0 in | Wt 195.7 lb

## 2012-01-08 DIAGNOSIS — Z1211 Encounter for screening for malignant neoplasm of colon: Secondary | ICD-10-CM

## 2012-01-08 MED ORDER — MOVIPREP 100 G PO SOLR: ORAL | Status: DC

## 2012-01-20 ENCOUNTER — Telehealth: Payer: Self-pay | Admitting: Gastroenterology

## 2012-01-20 ENCOUNTER — Ambulatory Visit (INDEPENDENT_AMBULATORY_CARE_PROVIDER_SITE_OTHER): Payer: Medicare Other | Admitting: Family Medicine

## 2012-01-20 ENCOUNTER — Encounter: Payer: Self-pay | Admitting: Family Medicine

## 2012-01-20 VITALS — BP 138/66 | HR 91 | Temp 97.9°F | Wt 194.0 lb

## 2012-01-20 DIAGNOSIS — K118 Other diseases of salivary glands: Secondary | ICD-10-CM

## 2012-01-20 MED ORDER — ATORVASTATIN CALCIUM 40 MG PO TABS: 40.0000 mg | ORAL_TABLET | Freq: Every day | ORAL | Status: DC

## 2012-01-20 NOTE — Progress Notes (Signed)
  Subjective:    Patient ID: Deborah Bryant, female    DOB: 1946-06-10, 66 y.o.   MRN: [id number]  HPI Here for 2 days of swelling and discomfort in the left cheek. This gets worse when she eats. No fever or ST.    Review of Systems  Constitutional: Negative.   HENT: Positive for facial swelling. Negative for ear pain, congestion, rhinorrhea, neck pain and postnasal drip.   Eyes: Negative.   Respiratory: Negative.        Objective:   Physical Exam  Constitutional: She appears well-developed and well-nourished.  HENT:  Right Ear: External ear normal.  Left Ear: External ear normal.  Nose: Nose normal.  Mouth/Throat: Oropharynx is clear and moist. No oropharyngeal exudate.       The left cheek is swollen and slightly tender. Not warm   Eyes: Conjunctivae are normal.  Neck: Neck supple. No thyromegaly present.  Lymphadenopathy:    She has no cervical adenopathy.          Assessment & Plan:  She probably has a stone in the parotid duct. Use lemon drops and warm compresses. If she is not better in 2 days, we will refer her to ENT

## 2012-01-20 NOTE — Telephone Encounter (Signed)
No charge. 

## 2012-01-22 ENCOUNTER — Other Ambulatory Visit: Payer: Medicare Other | Admitting: Gastroenterology

## 2012-02-10 ENCOUNTER — Telehealth: Payer: Self-pay | Admitting: Gastroenterology

## 2012-02-10 NOTE — Telephone Encounter (Signed)
Patient c/o intermittent episodes of abdominal pain, diarrhea, and rectal bleeding.  The last time it happened she was in [place].  She wants to discuss prior to the recall colonoscopy she has scheduled for April.  She is scheduled for an office visit for 03/10/12.  She currently has no symptoms.

## 2012-03-10 ENCOUNTER — Ambulatory Visit (INDEPENDENT_AMBULATORY_CARE_PROVIDER_SITE_OTHER): Payer: Medicare Other | Admitting: Gastroenterology

## 2012-03-10 ENCOUNTER — Encounter: Payer: Self-pay | Admitting: Gastroenterology

## 2012-03-10 VITALS — BP 120/76 | HR 84 | Ht 63.0 in | Wt 194.8 lb

## 2012-03-10 DIAGNOSIS — R1031 Right lower quadrant pain: Secondary | ICD-10-CM

## 2012-03-10 DIAGNOSIS — K5900 Constipation, unspecified: Secondary | ICD-10-CM

## 2012-03-10 DIAGNOSIS — Z86010 Personal history of colonic polyps: Secondary | ICD-10-CM

## 2012-03-10 NOTE — Progress Notes (Signed)
History of Present Illness: This is a 66 year old female who has chronic constipation and ongoing right lower quadrant pain. Right lower quadrant pain is there constantly and generally improves following bowel movements. She is reluctant to take laxatives. She has had several episodes of the abrupt onset of crampy lower abdominal pain associated with urgent bowel movements and small volume rectal bleeding. These episodes have occurred about once every year for the past 3 years. Denies weight loss, change in stool caliber, melena, nausea, vomiting, dysphagia, reflux symptoms, chest pain.  Current Medications, Allergies, Past Medical History, Past Surgical History, Family History and Social History were reviewed in [employer] record.  Physical Exam: General: Well developed , well nourished, no acute distress Head: Normocephalic and atraumatic Eyes:  sclerae anicteric, EOMI Ears: Normal auditory acuity Mouth: No deformity or lesions Lungs: Clear throughout to auscultation Heart: Regular rate and rhythm; no murmurs, rubs or bruits Abdomen: Soft, mild right lower quadrant tenderness, without rebound or guarding, and non distended. No masses, hepatosplenomegaly or hernias noted. Normal Bowel sounds Rectal: Deferred to colonoscopy Musculoskeletal: Symmetrical with no gross deformities  Pulses:  Normal pulses noted Extremities: No clubbing, cyanosis, edema or deformities noted Neurological: Alert oriented x 4, grossly nonfocal Psychological:  Alert and cooperative. Normal mood and affect  Assessment and Recommendations:  1. Chronic constipation and right lower quadrant abdominal pain. Increase dietary fiber and water intake. Begin daily stool softener. Schedule CT scan of the abdomen and pelvis for right lower quadrant pain. Schedule colonoscopy.  2. Several episodes of crampy lower abdominal pain associated with urgent stools and small volume hematochezia. She appears to have  had several episodes of self-limited colitis possibly ischemic colitis. Will obtain a CTA.  3. Personal history of adenomatous colon polyps diagnosed January 2008. She is due for surveillance colonoscopy. Schedule colonoscopy. The risks, benefits, and alternatives to colonoscopy with possible biopsy and possible polypectomy were discussed with the patient and they consent to proceed.

## 2012-03-10 NOTE — Patient Instructions (Signed)
You have been scheduled for a colonoscopy with propofol. Please follow written instructions given to you at your visit today.  Please pick up your prep kit at the pharmacy within the next 1-3 days. We will contact regarding scheduling your CT scan of the abdomen and pelvis after I coordinate with Dr. Claris Che nurse your CT scan of the chest. Increase fiber intake daily.  Take a stool softener daily to help with constipation. cc: Gershon Crane, MD

## 2012-03-11 ENCOUNTER — Telehealth: Payer: Self-pay

## 2012-03-11 ENCOUNTER — Other Ambulatory Visit: Payer: Self-pay | Admitting: Gastroenterology

## 2012-03-11 ENCOUNTER — Encounter: Payer: Self-pay | Admitting: Gastroenterology

## 2012-03-11 DIAGNOSIS — R197 Diarrhea, unspecified: Secondary | ICD-10-CM

## 2012-03-11 DIAGNOSIS — K625 Hemorrhage of anus and rectum: Secondary | ICD-10-CM

## 2012-03-11 DIAGNOSIS — R911 Solitary pulmonary nodule: Secondary | ICD-10-CM

## 2012-03-11 DIAGNOSIS — R1031 Right lower quadrant pain: Secondary | ICD-10-CM

## 2012-03-11 NOTE — Telephone Encounter (Signed)
Amanda from Dr. Ardell Isaacs office called and states Dr. Russella Dar is going to order a CT abdomen pelvis and CT angio abdomen pelvis on pt and would like to know if a CT chest could be ordered since it was almost time for to have it.    Ok per Dr. Clent Ridges; Marchelle Folks is aware.

## 2012-03-12 ENCOUNTER — Ambulatory Visit: Payer: Medicare Other

## 2012-03-12 DIAGNOSIS — K625 Hemorrhage of anus and rectum: Secondary | ICD-10-CM

## 2012-03-12 DIAGNOSIS — R1031 Right lower quadrant pain: Secondary | ICD-10-CM

## 2012-03-12 DIAGNOSIS — R197 Diarrhea, unspecified: Secondary | ICD-10-CM

## 2012-03-16 ENCOUNTER — Encounter: Payer: Self-pay | Admitting: Gastroenterology

## 2012-03-17 ENCOUNTER — Ambulatory Visit (INDEPENDENT_AMBULATORY_CARE_PROVIDER_SITE_OTHER)
Admission: RE | Admit: 2012-03-17 | Discharge: 2012-03-17 | Disposition: A | Discharge status: Home / Self Care | Payer: Medicare Other | Source: Ambulatory Visit | Attending: Gastroenterology | Admitting: Gastroenterology

## 2012-03-17 ENCOUNTER — Ambulatory Visit (INDEPENDENT_AMBULATORY_CARE_PROVIDER_SITE_OTHER)
Admission: RE | Admit: 2012-03-17 | Discharge: 2012-03-17 | Disposition: A | Discharge status: Home / Self Care | Payer: Medicare Other | Source: Ambulatory Visit | Attending: Family Medicine | Admitting: Family Medicine

## 2012-03-17 DIAGNOSIS — R911 Solitary pulmonary nodule: Secondary | ICD-10-CM

## 2012-03-17 DIAGNOSIS — R197 Diarrhea, unspecified: Secondary | ICD-10-CM

## 2012-03-17 DIAGNOSIS — K625 Hemorrhage of anus and rectum: Secondary | ICD-10-CM

## 2012-03-17 MED ORDER — IOHEXOL 350 MG/ML SOLN
100.0000 mL | Freq: Once | INTRAVENOUS | Status: AC | PRN
  Administered 2012-03-17: 100 mL via INTRAVENOUS

## 2012-03-18 ENCOUNTER — Other Ambulatory Visit: Payer: Self-pay | Admitting: Family Medicine

## 2012-03-23 NOTE — Telephone Encounter (Signed)
Call in #[phone] with 5 rf 

## 2012-03-26 NOTE — Telephone Encounter (Signed)
Call in #[phone] with 5 rf 

## 2012-04-01 ENCOUNTER — Encounter: Payer: Self-pay | Admitting: Gastroenterology

## 2012-04-01 ENCOUNTER — Ambulatory Visit (AMBULATORY_SURGERY_CENTER): Payer: Medicare Other | Admitting: Gastroenterology

## 2012-04-01 VITALS — BP 125/62 | HR 85 | Temp 96.9°F | Resp 16 | Ht 63.0 in | Wt 194.0 lb

## 2012-04-01 DIAGNOSIS — Z86010 Personal history of colonic polyps: Secondary | ICD-10-CM

## 2012-04-01 DIAGNOSIS — D126 Benign neoplasm of colon, unspecified: Secondary | ICD-10-CM

## 2012-04-01 DIAGNOSIS — R109 Unspecified abdominal pain: Secondary | ICD-10-CM

## 2012-04-01 DIAGNOSIS — Z1211 Encounter for screening for malignant neoplasm of colon: Secondary | ICD-10-CM

## 2012-04-01 DIAGNOSIS — K5900 Constipation, unspecified: Secondary | ICD-10-CM

## 2012-04-01 HISTORY — PX: COLONOSCOPY: SHX174

## 2012-04-01 MED ORDER — SODIUM CHLORIDE 0.9 % IV SOLN: 500.0000 mL | INTRAVENOUS | Status: DC

## 2012-04-01 NOTE — Op Note (Signed)
[facility] [address] [place], [place]  [address]  COLONOSCOPY PROCEDURE REPORT PATIENT:  Yarrow, Linhart  MR#:  [id number] BIRTHDATE:  [birth year], 65 yrs. old  GENDER:  female ENDOSCOPIST:  Judie Petit T. Russella Dar, MD, [facility]  PROCEDURE DATE:  04/01/2012 PROCEDURE:  Colonoscopy with snare polypectomy ASA CLASS:  Class II INDICATIONS:  1) surveillance and high-risk screening  2) RLQ abdominal pain  3) history of pre-cancerous (adenomatous) colon polyps: 2008 MEDICATIONS:   These medications were titrated to patient response per physician's verbal order, Fentanyl 150 mcg IV, Versed 15 mg IV, Benadryl 50 mg IV DESCRIPTION OF PROCEDURE:   After the risks benefits and alternatives of the procedure were thoroughly explained, informed consent was obtained.  Digital rectal exam was performed and revealed no abnormalities.   The LB 180AL [id number] endoscope was introduced through the anus and advanced to the cecum, which was identified by both the appendix and ileocecal valve, with  a tortuous and redundant colon, moderately difficult procedure.  The quality of the prep was excellent, using MoviPrep.  The instrument was then slowly withdrawn as the colon was fully examined. <<PROCEDUREIMAGES>> FINDINGS:  A sessile polyp was found in the ascending colon. It was 10 mm in size. Polyp was snared, then cauterized with monopolar cautery. Retrieval was successful. A sessile polyp was found in the mid transverse colon. It was 9 mm in size. Polyp was snared, then cauterized with monopolar cautery. Retrieval was successful. Otherwise normal colonoscopy without other polyps, masses, vascular ectasias, or inflammatory changes.  The terminal ileum appeared normal.  Retroflexed views in the rectum revealed no abnormalities.  The time to cecum =  7.25  minutes. The scope was then withdrawn (time =  10.67  min) from the patient and the procedure completed.  COMPLICATIONS:  None  ENDOSCOPIC  IMPRESSION: 1) 10 mm sessile polyp in the ascending colon 2) 9 mm sessile polyp in the mid transverse colon 3) Normal terminal ileum  RECOMMENDATIONS: 1) Hold aspirin, aspirin products, and anti-inflammatory medication for 2 weeks. 2) Await pathology results 3) Miralax 1-3 times daily, titrate to need 4) Sedation with MAC for future procedures 5) Repeat Colonoscopy in 5 years.  Venita Lick. Russella Dar, MD, [facility]  n. eSIGNED:   Venita Lick. Kailany Dinunzio at 04/01/2012 02:40 PM  Roanna Raider, [phone]

## 2012-04-01 NOTE — Patient Instructions (Signed)
YOU HAD AN ENDOSCOPIC PROCEDURE TODAY AT THE [facility]: Refer to the procedure report that was given to you for any specific questions about what was found during the examination.  If the procedure report does not answer your questions, please call your gastroenterologist to clarify.  If you requested that your care partner not be given the details of your procedure findings, then the procedure report has been included in a sealed envelope for you to review at your convenience later.  YOU SHOULD EXPECT: Some feelings of bloating in the abdomen. Passage of more gas than usual.  Walking can help get rid of the air that was put into your GI tract during the procedure and reduce the bloating. If you had a lower endoscopy (such as a colonoscopy or flexible sigmoidoscopy) you may notice spotting of blood in your stool or on the toilet paper. If you underwent a bowel prep for your procedure, then you may not have a normal bowel movement for a few days.  DIET: Your first meal following the procedure should be a light meal and then it is ok to progress to your normal diet.  A half-sandwich or bowl of soup is an example of a good first meal.  Heavy or fried foods are harder to digest and may make you feel nauseous or bloated.  Likewise meals heavy in dairy and vegetables can cause extra gas to form and this can also increase the bloating.  Drink plenty of fluids but you should avoid alcoholic beverages for 24 hours.  ACTIVITY: Your care partner should take you home directly after the procedure.  You should plan to take it easy, moving slowly for the rest of the day.  You can resume normal activity the day after the procedure however you should NOT DRIVE or use heavy machinery for 24 hours (because of the sedation medicines used during the test).    SYMPTOMS TO REPORT IMMEDIATELY: A gastroenterologist can be reached at any hour.  During normal business hours, 8:30 AM to 5:00 PM Monday through Friday,  call [phone].  After hours and on weekends, please call the GI answering service at [phone] who will take a message and have the physician on call contact you.   Following lower endoscopy (colonoscopy or flexible sigmoidoscopy):  Excessive amounts of blood in the stool  Significant tenderness or worsening of abdominal pains  Swelling of the abdomen that is new, acute  Fever of 100F or higher  Following upper endoscopy (EGD)  Vomiting of blood or coffee ground material  New chest pain or pain under the shoulder blades  Painful or persistently difficult swallowing  New shortness of breath  Fever of 100F or higher  Black, tarry-looking stools  FOLLOW UP: If any biopsies were taken you will be contacted by phone or by letter within the next 1-3 weeks.  Call your gastroenterologist if you have not heard about the biopsies in 3 weeks.  Our staff will call the home number listed on your records the next business day following your procedure to check on you and address any questions or concerns that you may have at that time regarding the information given to you following your procedure. This is a courtesy call and so if there is no answer at the home number and we have not heard from you through the emergency physician on call, we will assume that you have returned to your regular daily activities without incident.  SIGNATURES/CONFIDENTIALITY: You and/or your care   partner have signed paperwork which will be entered into your electronic medical record.  These signatures attest to the fact that that the information above on your After Visit Summary has been reviewed and is understood.  Full responsibility of the confidentiality of this discharge information lies with you and/or your care-partner.   PLEASE HOLD ASPIRIN, ASPIRIN PRODUCTS, AND ALL ANTI-INFLAMMATORY MEDICINES LIKE MOTRIN, ADVIL, ALEVE FOR 2 WEEKS. TYLENOL ONLY AS NEEDED FOR PAIN.  TRY MIRALAX 17 GRAMS ( ONE CAPFUL) 1-3  TIMES A DAY AS NEEDED FOR CONSTIPATION AND TITRATE AS NEEDED  REPEAT COLONOSCOPY IN 5 YEARS 2018. IF THIS CHANGES, IT WILL BE IN THE LETTER YOU RECEIVE IN 1-2 WEEKS.

## 2012-04-01 NOTE — Progress Notes (Signed)
Patient did not experience any of the following events: a burn prior to discharge; a fall within the facility; wrong site/side/patient/procedure/implant event; or a hospital transfer or hospital admission upon discharge from the facility. (G8907) Patient did not have preoperative order for IV antibiotic SSI prophylaxis. (G8918)  

## 2012-04-02 ENCOUNTER — Telehealth: Payer: Self-pay

## 2012-04-02 NOTE — Telephone Encounter (Signed)
Left message on answering machine. 

## 2012-04-06 ENCOUNTER — Encounter: Payer: Self-pay | Admitting: Gastroenterology

## 2012-04-14 ENCOUNTER — Encounter: Payer: Self-pay | Admitting: Family Medicine

## 2012-04-14 ENCOUNTER — Ambulatory Visit (INDEPENDENT_AMBULATORY_CARE_PROVIDER_SITE_OTHER): Payer: Medicare Other | Admitting: Family Medicine

## 2012-04-14 VITALS — BP 138/70 | HR 96 | Temp 98.3°F | Wt 192.0 lb

## 2012-04-14 DIAGNOSIS — K589 Irritable bowel syndrome without diarrhea: Secondary | ICD-10-CM

## 2012-04-14 MED ORDER — DICYCLOMINE HCL 20 MG PO TABS: 20.0000 mg | ORAL_TABLET | Freq: Three times a day (TID) | ORAL | Status: DC | PRN

## 2012-04-14 MED ORDER — PROMETHAZINE-CODEINE 6.25-10 MG/5ML PO SYRP
5.0000 mL | ORAL_SOLUTION | ORAL | Status: AC | PRN
Start: 2012-04-14 — End: 2012-04-24

## 2012-04-14 NOTE — Progress Notes (Signed)
  Subjective:    Patient ID: Deborah Bryant, female    DOB: [birth year], 66 y.o.   MRN: [id number]  HPI Here to discuss intermittent RLQ abdominal pains, passage of large amounts of flatus, and diarrhea. She has seen Dr. Russella Dar for this several times and has had an extensive workup including CT scans of the chest and abdomen and pelvis on 03-17-12 and a colonoscopy on 04-01-12. This workup was essentially unrevealing, so it seems likely that she has IBS. We spoke at length about this today, including dietary changes she can make and treatments. Otherwise she has had no fevers, appetite is good, no nausea, and no weight changes.    Review of Systems  Constitutional: Negative.   Respiratory: Negative.   Cardiovascular: Negative.   Gastrointestinal: Positive for abdominal pain, diarrhea and abdominal distention. Negative for nausea, vomiting, constipation, blood in stool and rectal pain.       Objective:   Physical Exam  Constitutional: She appears well-developed and well-nourished.  Cardiovascular: Normal rate, regular rhythm, normal heart sounds and intact distal pulses.   Pulmonary/Chest: Effort normal and breath sounds normal.  Abdominal: Soft. Bowel sounds are normal. She exhibits no distension and no mass. There is no tenderness. There is no rebound and no guarding.          Assessment & Plan:  This is IBS. We discussed using Miralax on a daily basis, drinking more water and less caffeine. We discussed foods to avoid. Try Dicyclomine for cramps.

## 2012-04-15 MED ORDER — POLYETHYLENE GLYCOL 3350 17 GM/SCOOP PO POWD: 17.0000 g | Freq: Every day | ORAL | Status: AC

## 2012-06-03 ENCOUNTER — Encounter: Payer: Self-pay | Admitting: Gastroenterology

## 2012-06-03 ENCOUNTER — Ambulatory Visit (INDEPENDENT_AMBULATORY_CARE_PROVIDER_SITE_OTHER): Payer: Medicare Other | Admitting: Gastroenterology

## 2012-06-03 VITALS — BP 128/72 | HR 92 | Ht 63.0 in | Wt 187.8 lb

## 2012-06-03 DIAGNOSIS — K589 Irritable bowel syndrome without diarrhea: Secondary | ICD-10-CM

## 2012-06-03 DIAGNOSIS — R1031 Right lower quadrant pain: Secondary | ICD-10-CM

## 2012-06-03 DIAGNOSIS — K5900 Constipation, unspecified: Secondary | ICD-10-CM

## 2012-06-03 DIAGNOSIS — Z86010 Personal history of colonic polyps: Secondary | ICD-10-CM

## 2012-06-03 MED ORDER — ALIGN 4 MG PO CAPS: 1.0000 | ORAL_CAPSULE | Freq: Every day | ORAL | Status: DC

## 2012-06-03 NOTE — Patient Instructions (Addendum)
We have given you samples of Align. This puts good bacteria back into your colon. You should take 1 capsule by mouth once daily. If this works well for you, it can be purchased over the counter. IBS brochure given to patient. cc: Gershon Crane, MD

## 2012-06-03 NOTE — Progress Notes (Signed)
History of Present Illness: This is a 66 year old female with ongoing right lower quadrant pain and constipation. She has episodes of gas bloating and urgent stools every several months. Most often these episodes occur after she has had worsening constipation. She recently underwent colonoscopy showing 2 adenomatous colon polyps. Abdominal/pelvic CTA results as below. She was recommended to use MiraLax on an ongoing basis to help with chronic constipation and dicyclomine was recently started. Her symptoms have improved however she has not used dicyclomine frequently. She remains very concerned about right lower quadrant pain and that her diagnosis of IBS is not correct. She has a list of questions.  1. 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 This is likely a chronic finding  as there are multiple hypertrophied duodenal and pancreatic  branches which arise from the SMA to provide collateral supply to  the celiac axis distribution.  2. The remaining branch vessels of the normal caliber abdominal  aorta, including the IMA are widely patent.  3. No CT evidence of end organ ischemia.  4. Multilevel lumbar spine degenerative change.  Current Medications, Allergies, Past Medical History, Past Surgical History, Family History and Social History were reviewed in [employer] record.  Physical Exam: General: Well developed , well nourished, no acute distress Head: Normocephalic and atraumatic Eyes:  sclerae anicteric, EOMI Ears: Normal auditory acuity Mouth: No deformity or lesions Lungs: Clear throughout to auscultation Heart: Regular rate and rhythm; no murmurs, rubs or bruits Abdomen: Soft, non tender and non distended. No masses, hepatosplenomegaly or hernias noted. Normal Bowel sounds Musculoskeletal: Symmetrical with no gross deformities  Extremities: No clubbing, cyanosis, edema or deformities noted Neurological:  Alert oriented x 4, grossly nonfocal Psychological:  Alert and cooperative. Anxious.  Assessment and Recommendations:  1. IBS-chronic constipation and right lower quadrant pain. Evaluation from a gastrointestinal standpoint is complete at this time. I spent approximately 30 minutes counseling her and answering her questions. She was reassured several times that she has had a thorough gastrointestinal workup and irritable bowel syndrome is the diagnosis and this needs ongoing treatment. She is concerned that we have missed the diagnosis and has concerns that it is unlikely she has developed irritable bowel syndrome. Reassurance provided. Begin a daily probiotic. Titrate MiraLax between one half dose every other day and 2 doses daily to adequately control constipation. I encouraged her to use Bentyl frequently at 10-20 mg 3 times a day as needed for abdominal pain and bloating. She is advised to call if this regimen is not effective in managing her symptoms and we can consider another anti-spasmodic.  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  3. Personal history of adenomatous colon polyps. Surveillance colonoscopy in 5 years.

## 2012-06-23 ENCOUNTER — Encounter: Payer: Self-pay | Admitting: Family Medicine

## 2012-06-23 ENCOUNTER — Ambulatory Visit (INDEPENDENT_AMBULATORY_CARE_PROVIDER_SITE_OTHER): Payer: Medicare Other | Admitting: Family Medicine

## 2012-06-23 VITALS — BP 128/62 | HR 84 | Temp 98.4°F | Wt 188.0 lb

## 2012-06-23 DIAGNOSIS — K589 Irritable bowel syndrome without diarrhea: Secondary | ICD-10-CM

## 2012-06-23 MED ORDER — LINACLOTIDE 290 MCG PO CAPS: 1.0000 | ORAL_CAPSULE | Freq: Every day | ORAL | Status: DC

## 2012-06-23 MED ORDER — DIAZEPAM 5 MG PO TABS: 5.0000 mg | ORAL_TABLET | Freq: Three times a day (TID) | ORAL | Status: DC | PRN

## 2012-06-23 NOTE — Progress Notes (Signed)
  Subjective:    Patient ID: Deborah Bryant, female    DOB: [birth year], 66 y.o.   MRN: [id number]  HPI Here to follow up on IBS. She continues to have alternating stools between diarrhea and constipation, bloating, and RLQ pains. She has had a full workup as detailed in her chart, including labs, colonoscopy, and a CT scan of the abdomen and pelvis. All of this was unremarkable. Both Dr. Russella Dar and I feel that she has IBS, and she has been reading a lot about this on the Internet. She has made dietary changes but nothing seems to make much difference. Using Align probiotics has not helped, nor has Miralax. Her main compaint now is the pain and bloating she feels. No nausea or fever.    Review of Systems  Constitutional: Negative.   Gastrointestinal: Positive for abdominal pain, diarrhea, constipation and abdominal distention. Negative for nausea, vomiting, blood in stool and rectal pain.       Objective:   Physical Exam  Constitutional: She appears well-developed and well-nourished.  Abdominal: Soft. Bowel sounds are normal. She exhibits no distension and no mass. There is no tenderness. There is no rebound and no guarding.          Assessment & Plan:  This is IBS, constipation predominant. I gave her samples of Linzess to try for 3 weeks. Then she will return

## 2012-07-14 ENCOUNTER — Encounter: Payer: Self-pay | Admitting: Family Medicine

## 2012-07-20 ENCOUNTER — Encounter: Payer: Self-pay | Admitting: Family Medicine

## 2012-07-20 ENCOUNTER — Ambulatory Visit (INDEPENDENT_AMBULATORY_CARE_PROVIDER_SITE_OTHER): Payer: Medicare Other | Admitting: Family Medicine

## 2012-07-20 VITALS — BP 134/70 | HR 86 | Temp 97.6°F | Ht 62.5 in | Wt 185.0 lb

## 2012-07-20 DIAGNOSIS — E119 Type 2 diabetes mellitus without complications: Secondary | ICD-10-CM

## 2012-07-20 DIAGNOSIS — I10 Essential (primary) hypertension: Secondary | ICD-10-CM

## 2012-07-20 DIAGNOSIS — M109 Gout, unspecified: Secondary | ICD-10-CM

## 2012-07-20 DIAGNOSIS — Z0000 Encounter for general adult medical examination without abnormal findings: Secondary | ICD-10-CM

## 2012-07-20 LAB — LIPID PANEL
Cholesterol: 121 mg/dL (ref 0–200)
LDL Cholesterol: 41 mg/dL (ref 0–99)
Total CHOL/HDL Ratio: 2
VLDL: 26 mg/dL (ref 0.0–40.0)

## 2012-07-20 LAB — HEPATIC FUNCTION PANEL
ALT: 15 U/L (ref 0–35)
AST: 16 U/L (ref 0–37)
Bilirubin, Direct: 0 mg/dL (ref 0.0–0.3)
Total Bilirubin: 0.4 mg/dL (ref 0.3–1.2)

## 2012-07-20 LAB — POCT URINALYSIS DIPSTICK
Bilirubin, UA: NEGATIVE
Ketones, UA: NEGATIVE
Leukocytes, UA: NEGATIVE
Spec Grav, UA: 1.015

## 2012-07-20 LAB — CBC WITH DIFFERENTIAL/PLATELET
Basophils Relative: 0.8 % (ref 0.0–3.0)
Eosinophils Relative: 0.9 % (ref 0.0–5.0)
HCT: 40.8 % (ref 36.0–46.0)
Hemoglobin: 13.6 g/dL (ref 12.0–15.0)
Lymphocytes Relative: 15.9 % (ref 12.0–46.0)
Lymphs Abs: 1.6 10*3/uL (ref 0.7–4.0)
Monocytes Relative: 6.1 % (ref 3.0–12.0)
Neutro Abs: 7.8 10*3/uL — ABNORMAL HIGH (ref 1.4–7.7)
RBC: 4.59 Mil/uL (ref 3.87–5.11)

## 2012-07-20 LAB — TSH: TSH: 2.17 u[IU]/mL (ref 0.35–5.50)

## 2012-07-20 LAB — BASIC METABOLIC PANEL
BUN: 20 mg/dL (ref 6–23)
Chloride: 100 mEq/L (ref 96–112)
Creatinine, Ser: 1 mg/dL (ref 0.4–1.2)
GFR: 58.3 mL/min — ABNORMAL LOW (ref >60.00)
Potassium: 4 mEq/L (ref 3.5–5.1)

## 2012-07-20 MED ORDER — AMLODIPINE BESY-BENAZEPRIL HCL 10-20 MG PO CAPS: 1.0000 | ORAL_CAPSULE | Freq: Every day | ORAL | Status: DC

## 2012-07-20 MED ORDER — DICLOFENAC SODIUM 75 MG PO TBEC: 75.0000 mg | DELAYED_RELEASE_TABLET | Freq: Two times a day (BID) | ORAL | Status: DC

## 2012-07-20 MED ORDER — GLIPIZIDE ER 5 MG PO TB24: 5.0000 mg | ORAL_TABLET | Freq: Every day | ORAL | Status: DC

## 2012-07-20 MED ORDER — ATORVASTATIN CALCIUM 40 MG PO TABS: 40.0000 mg | ORAL_TABLET | Freq: Every day | ORAL | Status: DC

## 2012-07-20 MED ORDER — DICYCLOMINE HCL 20 MG PO TABS: 20.0000 mg | ORAL_TABLET | Freq: Three times a day (TID) | ORAL | Status: DC | PRN

## 2012-07-20 MED ORDER — HYDROCHLOROTHIAZIDE 25 MG PO TABS: 25.0000 mg | ORAL_TABLET | Freq: Two times a day (BID) | ORAL | Status: DC

## 2012-07-20 MED ORDER — FLUTICASONE PROPIONATE 50 MCG/ACT NA SUSP: 2.0000 | Freq: Two times a day (BID) | NASAL | Status: DC

## 2012-07-20 NOTE — Progress Notes (Signed)
  Subjective:    Patient ID: Deborah Bryant, female    DOB: [birth year], 66 y.o.   MRN: [id number]  HPI 66 yr old female for a cpx. She feels well with no complaints.    Review of Systems  Constitutional: Negative.   HENT: Negative.   Eyes: Negative.   Respiratory: Negative.   Cardiovascular: Negative.   Gastrointestinal: Negative.   Genitourinary: Negative for dysuria, urgency, frequency, hematuria, flank pain, decreased urine volume, enuresis, difficulty urinating, pelvic pain and dyspareunia.  Musculoskeletal: Negative.   Skin: Negative.   Neurological: Negative.   Hematological: Negative.   Psychiatric/Behavioral: Negative.        Objective:   Physical Exam  Constitutional: She is oriented to person, place, and time. She appears well-developed and well-nourished. No distress.  HENT:  Head: Normocephalic and atraumatic.  Right Ear: External ear normal.  Left Ear: External ear normal.  Nose: Nose normal.  Mouth/Throat: Oropharynx is clear and moist. No oropharyngeal exudate.  Eyes: Conjunctivae and EOM are normal. Pupils are equal, round, and reactive to light. No scleral icterus.  Neck: Normal range of motion. Neck supple. No JVD present. No thyromegaly present.  Cardiovascular: Normal rate, regular rhythm, normal heart sounds and intact distal pulses.  Exam reveals no gallop and no friction rub.   No murmur heard.      EKG normal with a few PACs   Pulmonary/Chest: Effort normal and breath sounds normal. No respiratory distress. She has no wheezes. She has no rales. She exhibits no tenderness.  Abdominal: Soft. Bowel sounds are normal. She exhibits no distension and no mass. There is no tenderness. There is no rebound and no guarding.  Musculoskeletal: Normal range of motion. She exhibits no edema and no tenderness.  Lymphadenopathy:    She has no cervical adenopathy.  Neurological: She is alert and oriented to person, place, and time. She has normal reflexes. No cranial nerve  deficit. She exhibits normal muscle tone. Coordination normal.  Skin: Skin is warm and dry. No rash noted. No erythema.  Psychiatric: She has a normal mood and affect. Her behavior is normal. Judgment and thought content normal.          Assessment & Plan:  Well exam. Get fasting labs

## 2012-07-20 NOTE — Progress Notes (Signed)
  Subjective:    Patient ID: Deborah Bryant, female    DOB: 03/18/1946, 66 y.o.   MRN: [id number]  HPI 66 yr old female for a cpx.    Review of Systems     Objective:   Physical Exam        Assessment & Plan:

## 2012-07-21 NOTE — Progress Notes (Signed)
Quick Note:  I left voice message with below results. ______

## 2012-07-27 ENCOUNTER — Telehealth: Payer: Self-pay | Admitting: Family Medicine

## 2012-07-27 NOTE — Telephone Encounter (Signed)
Pt would like blood work results °

## 2012-07-27 NOTE — Telephone Encounter (Signed)
Spoke with pt- informed of labs and dr. Claris Che instructions - copy mailed as requested

## 2012-09-30 ENCOUNTER — Ambulatory Visit (INDEPENDENT_AMBULATORY_CARE_PROVIDER_SITE_OTHER): Payer: Medicare Other | Admitting: Family Medicine

## 2012-09-30 ENCOUNTER — Encounter: Payer: Self-pay | Admitting: Family Medicine

## 2012-09-30 VITALS — BP 128/64 | HR 100 | Temp 98.2°F | Wt 182.0 lb

## 2012-09-30 DIAGNOSIS — K589 Irritable bowel syndrome without diarrhea: Secondary | ICD-10-CM

## 2012-09-30 MED ORDER — PHENYLEPH-PROMETHAZINE-COD 5-6.25-10 MG/5ML PO SYRP: 5.0000 mL | ORAL_SOLUTION | Freq: Two times a day (BID) | ORAL | Status: DC | PRN

## 2012-09-30 NOTE — Progress Notes (Signed)
  Subjective:    Patient ID: Deborah Bryant, female    DOB: 1946-09-18, 66 y.o.   MRN: [id number]  HPI Here to follow up on chronic abdominal pain and bloating that has been diagnosed as IBS. She has tried probiotics, Miralax, and dietary adjustments with no success. She actually had a little codeine with promethazine syrup at home and tried this, and she found this to be very effective. Just one tsp would give her relief almost an entire day. She could eat food with out pain for the first time in months.    Review of Systems  Constitutional: Negative.   Gastrointestinal: Positive for abdominal pain and abdominal distention. Negative for nausea, vomiting, diarrhea, constipation and blood in stool.       Objective:   Physical Exam  Constitutional: She appears well-developed and well-nourished.  Abdominal: Soft. Bowel sounds are normal. She exhibits no distension and no mass. There is no tenderness. There is no rebound and no guarding.          Assessment & Plan:  We will try this by taking 1 or 2 tsp daily. Follow up prn

## 2012-10-30 ENCOUNTER — Ambulatory Visit (INDEPENDENT_AMBULATORY_CARE_PROVIDER_SITE_OTHER): Payer: Medicare Other | Admitting: Family Medicine

## 2012-10-30 ENCOUNTER — Encounter: Payer: Self-pay | Admitting: Family Medicine

## 2012-10-30 VITALS — BP 132/72 | HR 80 | Temp 97.6°F | Resp 16 | Wt 184.0 lb

## 2012-10-30 DIAGNOSIS — L0390 Cellulitis, unspecified: Secondary | ICD-10-CM

## 2012-10-30 DIAGNOSIS — L0291 Cutaneous abscess, unspecified: Secondary | ICD-10-CM

## 2012-10-30 MED ORDER — DOXYCYCLINE HYCLATE 50 MG PO CAPS: 100.0000 mg | ORAL_CAPSULE | Freq: Two times a day (BID) | ORAL | Status: DC

## 2012-10-30 NOTE — Progress Notes (Signed)
  Subjective:    Patient ID: Deborah Bryant, female    DOB: 1946-06-18, 66 y.o.   MRN: [id number]  HPI Here with concern for possible MRSA around the left eye. She has had a small sebaceous cyst in the medial corner of the left eye for some months, and she saw Dr.Lupton for a dermatology exam 3 days ago. He punctured the cyst with a needle that day. Now over the past 48 hours the upper and lower lids around the left eye have been swelling and are slightly tender. No DC or vision problems. She had a serious cellulitis around this same eye in 2006 due to MRSA, and she wound up getting Vancomycin through a PICC line then to get rid of this. What she feels today reminds her of she felt like then at the beginning.    Review of Systems  Constitutional: Negative.   HENT: Positive for facial swelling.   Eyes: Negative.        Objective:   Physical Exam  Constitutional: She appears well-developed and well-nourished.  Eyes: Conjunctivae normal and EOM are normal. Pupils are equal, round, and reactive to light.       Small puncture wound in the medial corner of the left eye. Slight swelling of both left lids. Not tender or red or warm   Lymphadenopathy:    She has no cervical adenopathy.          Assessment & Plan:  Possible early cellulitis. We will be aggressive and treat for possible MRSA. Given 14 days of Doxycycline. Recheck 2 weeks

## 2012-11-01 ENCOUNTER — Emergency Department (HOSPITAL_COMMUNITY)
Admission: EM | Admit: 2012-11-01 | Discharge: 2012-11-01 | Disposition: A | Discharge status: Home Health | Payer: Medicare Other | Attending: Emergency Medicine | Admitting: Emergency Medicine

## 2012-11-01 ENCOUNTER — Other Ambulatory Visit (HOSPITAL_COMMUNITY): Payer: Medicare Other

## 2012-11-01 ENCOUNTER — Encounter (HOSPITAL_COMMUNITY): Payer: Self-pay | Admitting: Emergency Medicine

## 2012-11-01 ENCOUNTER — Emergency Department (HOSPITAL_COMMUNITY): Payer: Medicare Other

## 2012-11-01 DIAGNOSIS — I10 Essential (primary) hypertension: Secondary | ICD-10-CM | POA: Insufficient documentation

## 2012-11-01 DIAGNOSIS — Z7982 Long term (current) use of aspirin: Secondary | ICD-10-CM | POA: Insufficient documentation

## 2012-11-01 DIAGNOSIS — Z8739 Personal history of other diseases of the musculoskeletal system and connective tissue: Secondary | ICD-10-CM | POA: Insufficient documentation

## 2012-11-01 DIAGNOSIS — Z780 Asymptomatic menopausal state: Secondary | ICD-10-CM | POA: Insufficient documentation

## 2012-11-01 DIAGNOSIS — F17200 Nicotine dependence, unspecified, uncomplicated: Secondary | ICD-10-CM | POA: Insufficient documentation

## 2012-11-01 DIAGNOSIS — E785 Hyperlipidemia, unspecified: Secondary | ICD-10-CM | POA: Insufficient documentation

## 2012-11-01 DIAGNOSIS — E119 Type 2 diabetes mellitus without complications: Secondary | ICD-10-CM | POA: Insufficient documentation

## 2012-11-01 DIAGNOSIS — Z8719 Personal history of other diseases of the digestive system: Secondary | ICD-10-CM | POA: Insufficient documentation

## 2012-11-01 DIAGNOSIS — Z8614 Personal history of Methicillin resistant Staphylococcus aureus infection: Secondary | ICD-10-CM | POA: Insufficient documentation

## 2012-11-01 DIAGNOSIS — Z79899 Other long term (current) drug therapy: Secondary | ICD-10-CM | POA: Insufficient documentation

## 2012-11-01 DIAGNOSIS — R220 Localized swelling, mass and lump, head: Secondary | ICD-10-CM | POA: Insufficient documentation

## 2012-11-01 LAB — POCT I-STAT, CHEM 8
BUN: 17 mg/dL (ref 6–23)
Calcium, Ion: 1.2 mmol/L (ref 1.13–1.30)
Chloride: 110 mEq/L (ref 96–112)
Creatinine, Ser: 1.2 mg/dL — ABNORMAL HIGH (ref 0.50–1.10)
Glucose, Bld: 88 mg/dL (ref 70–99)
HCT: 43 % (ref 36.0–46.0)
Hemoglobin: 14.6 g/dL (ref 12.0–15.0)
Potassium: 3.8 mEq/L (ref 3.5–5.1)
Sodium: 142 mEq/L (ref 135–145)
TCO2: 24 mmol/L (ref 0–100)

## 2012-11-01 LAB — POCT I-STAT TROPONIN I: Troponin i, poc: 0 ng/mL (ref 0.00–0.08)

## 2012-11-01 MED ORDER — SODIUM CHLORIDE 0.9 % IV BOLUS (SEPSIS)
1000.0000 mL | Freq: Once | INTRAVENOUS | Status: AC
  Administered 2012-11-01: 1000 mL via INTRAVENOUS

## 2012-11-01 MED ORDER — IOHEXOL 300 MG/ML  SOLN
75.0000 mL | Freq: Once | INTRAMUSCULAR | Status: AC | PRN
  Administered 2012-11-01: 75 mL via INTRAVENOUS

## 2012-11-01 NOTE — ED Notes (Signed)
Patient transported to CT 

## 2012-11-01 NOTE — ED Provider Notes (Signed)
History  This chart was scribed for Raeford Razor, MD by Ladona Ridgel Day, ED scribe. This patient was seen in room TR04C/TR04C and the patient's care was started at 1153.   CSN: [id number]  Arrival date & time 11/01/12  1153   First MD Initiated Contact with Patient 11/01/12 1325      Chief Complaint  Patient presents with  . Facial Swelling   Patient is a 66 y.o. female presenting with eye problem. The history is provided by the patient. No language interpreter was used.  Eye Problem  This is a new problem. The current episode started more than 2 days ago. The problem occurs constantly. The problem has been gradually worsening. The left eye is affected.There was no injury mechanism. The patient is experiencing no pain. Pertinent negatives include no blurred vision, no decreased vision, no discharge, no double vision, no nausea, no vomiting and no weakness. She has tried nothing for the symptoms.   Deborah Bryant is a 66 y.o. female who presents to the Emergency Department complaining of constant gradually worsening left inferior periorbital swelling with hx of MRSA infection of her left eye 7 years ago which she states seems somewhat similar to previous episode. She states was seen at dermatologist 4 days ago to have cyst removed on her left upper cheek when she began noticing this problem. She was seen by PCP 2 days ago and started with doxycycline 100 mg x2/day or 14 days and recommended her for further evaluation at ER. She denies pain with EOM, no visual acuity changes, no diplopia, no ptosis.    Past Medical History  Diagnosis Date  . Allergic rhinitis   . Diabetes mellitus   . Hyperlipidemia   . Hypertension   . Arthritis   . Menopause   . Cough   . Hemorrhoids   . Tubular adenoma of colon 12/2006  . IBS (irritable bowel syndrome)   . Cellulitis, periorbital 2006    left eye, due to MRSA, saw Dr. Lazarus Salines   . 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 Polypectomy   . Colonoscopy 04-01-12    per Dr. Russella Dar, tubular adenoma, repeat in 5 yrs   . Tubal ligation   . Appendectomy     Family History  Problem Relation Age of Onset  . Alcohol abuse Mother   . Diabetes Mother   . Heart disease Father   . Heart disease Brother     History  Substance Use Topics  . Smoking status: Current Every Day Smoker -- 0.5 packs/day for 1 years    Types: Cigarettes  . Smokeless tobacco: Never Used     Comment: less than 1/2 pack  . Alcohol Use: 0.6 oz/week    1 Glasses of wine per week    OB History    Grav Para Term Preterm Abortions TAB SAB Ect Mult Living                  Review of Systems  Constitutional: Negative for fever and chills.  Eyes: Negative for blurred vision, double vision and discharge.  Respiratory: Negative for shortness of breath.   Gastrointestinal: Negative for nausea and vomiting.  Neurological: Negative for weakness.  All other systems reviewed and are negative.    Allergies  Phenobarbital  Home Medications   Current Outpatient Rx  Name  Route  Sig  Dispense  Refill  . AMLODIPINE BESY-BENAZEPRIL  HCL 10-20 MG PO CAPS   Oral   Take 1 capsule by mouth daily.         Marland Kitchen VITAMIN C 1000 MG PO TABS   Oral   Take 1,000 mg by mouth daily.         . ASPIRIN EC 81 MG PO TBEC   Oral   Take 81 mg by mouth daily.         . ATORVASTATIN CALCIUM 40 MG PO TABS   Oral   Take 40 mg by mouth daily.         Marland Kitchen DIAZEPAM 5 MG PO TABS   Oral   Take 5 mg by mouth every 8 (eight) hours as needed. For anxiety         . DICLOFENAC SODIUM 75 MG PO TBEC   Oral   Take 75 mg by mouth 2 (two) times daily. One by mouth two times a day pc         . DOXYCYCLINE HYCLATE 50 MG PO CAPS   Oral   Take 100 mg by mouth 2 (two) times daily. 14 day supply started 10-30-12 for eye swelling         . FLUTICASONE PROPIONATE 50 MCG/ACT NA SUSP   Nasal   Place 2 sprays into the  nose 2 (two) times daily as needed. For allergies         . GLIPIZIDE ER 5 MG PO TB24   Oral   Take 5 mg by mouth daily.         Marland Kitchen HYDROCHLOROTHIAZIDE 25 MG PO TABS   Oral   Take 25 mg by mouth 2 (two) times daily.         Marland Kitchen PHENYLEPH-PROMETHAZINE-COD 5-6.25-10 MG/5ML PO SYRP   Oral   Take 5 mLs by mouth 2 (two) times daily as needed. For Irritable Bowel Syndrome         . POLYETHYLENE GLYCOL 3350 PO POWD   Oral   Take 17 g by mouth daily.         Marland Kitchen SACCHAROMYCES BOULARDII 250 MG PO CAPS   Oral   Take 250 mg by mouth daily.           Triage Vitals: BP 155/62  Pulse 103  Temp 98.1 F (36.7 C) (Oral)  Resp 18  SpO2 99%  Physical Exam  Nursing note and vitals reviewed. Constitutional: She appears well-developed and well-nourished. No distress.  HENT:  Head: Normocephalic and atraumatic.       Above a cm above medial canthus L eyelid there is punctate scabbed lesion without surrounding skin changes.   Eyes: Conjunctivae normal and EOM are normal. Right eye exhibits no discharge. Left eye exhibits no discharge.       Minimal inferior periorbital swelling left eye. EOMs intact without pain. No proptosis, no periorbital tenderness.  Neck: Neck supple.  Cardiovascular: Normal rate, regular rhythm and normal heart sounds.  Exam reveals no gallop and no friction rub.   No murmur heard. Pulmonary/Chest: Effort normal and breath sounds normal. No respiratory distress.  Abdominal: Soft. She exhibits no distension. There is no tenderness.  Musculoskeletal: She exhibits no edema and no tenderness.  Neurological: She is alert.  Skin: Skin is warm and dry.  Psychiatric: She has a normal mood and affect. Her behavior is normal. Thought content normal.    ED Course  Procedures (including critical care time) DIAGNOSTIC STUDIES: Oxygen Saturation is 99% on room air, normal by my  interpretation.    COORDINATION OF CARE: At 130 PM Discussed treatment plan with patient  which includes IV fluids and CT orbits with contrast. Patient agrees.   Labs Reviewed  POCT I-STAT, CHEM 8 - Abnormal; Notable for the following:    Creatinine, Ser 1.20 (*)     All other components within normal limits  POCT I-STAT TROPONIN I  LAB REPORT - SCANNED   No results found.  Ct Orbits W/cm  11/01/2012  *RADIOLOGY REPORT*  Clinical Data: Left inferior periorbital swelling.  History of MRSA infection of the left thigh.  Evaluate for orbital cellulitis.  CT ORBITS WITH CONTRAST  Technique:  Multidetector CT imaging of the orbits was performed following the bolus administration of intravenous contrast.  Contrast: 75mL OMNIPAQUE IOHEXOL 300 MG/ML  SOLN  Comparison: 07/18/2005.  Findings: Globes and orbits appear within normal limits.  There is no periorbital soft tissue swelling or stranding.  Visualized intracranial contents appear within normal limits.  Ethmoid air cells and sphenoid sinuses clear.  Visualized mastoid air cells are clear.  Maxillary sinuses are clear.  Periorbital soft tissues appear within normal limits and are symmetric bilaterally.  IMPRESSION: Negative CT orbits.  No evidence of orbital or periorbital cellulitis by CT.   Original Report Authenticated By: Andreas Newport, M.D.   1. Left facial swelling       MDM  66yF with minimal L facial swelling. Pt self reports hx of orbital cellulitis. No exam evidence of this. CT without concerning findings. Feel safe for DC. Pt already on doxy and instructed to take until finished. Follow-up with her PCP.   3:20 PM Istat Chem8 ordered. Apparently Istat Troponin run? Ordered BMP to send to main lab.         Raeford Razor, MD 11/05/12 [phone]

## 2012-11-01 NOTE — ED Notes (Signed)
Pt reports swelling noted to left eye. Pt reports had a cyst removed Wednesday. Pt seen by PMD on Friday and given eye drops. Pt reports history of MRSA around that eye in the past with similar symptoms. Pt was told if swelling got worse over the weekend to come to ED.

## 2012-11-01 NOTE — ED Notes (Signed)
Returned from CT scan.

## 2012-11-02 NOTE — Addendum Note (Signed)
Addended by: Gershon Crane A on: 11/02/2012 10:32 AM   Modules accepted: Orders

## 2012-11-26 ENCOUNTER — Other Ambulatory Visit: Payer: Self-pay | Admitting: Family Medicine

## 2012-11-26 NOTE — Telephone Encounter (Signed)
Pt needs refill on promethazine syrp call into [facility] [address]

## 2012-11-27 MED ORDER — PHENYLEPH-PROMETHAZINE-COD 5-6.25-10 MG/5ML PO SYRP: 5.0000 mL | ORAL_SOLUTION | Freq: Two times a day (BID) | ORAL | Status: DC | PRN

## 2012-11-27 NOTE — Telephone Encounter (Signed)
I faxed the script to below pharmacy.

## 2012-11-27 NOTE — Telephone Encounter (Signed)
Call in Phenyleph-Prometh-cod syrup to take 5 ml bid prn IBS symptoms, 240 ml with 2 rf

## 2012-11-30 ENCOUNTER — Other Ambulatory Visit: Payer: Self-pay | Admitting: Family Medicine

## 2012-11-30 NOTE — Telephone Encounter (Signed)
Pt requesting a early refill on syrup, okay per Dr. Clent Ridges to fill and I did call in to [facility].

## 2012-12-02 ENCOUNTER — Ambulatory Visit (INDEPENDENT_AMBULATORY_CARE_PROVIDER_SITE_OTHER): Payer: Medicare Other | Admitting: Family Medicine

## 2012-12-02 ENCOUNTER — Encounter: Payer: Self-pay | Admitting: Family Medicine

## 2012-12-02 VITALS — BP 134/68 | HR 101 | Temp 98.1°F | Wt 181.0 lb

## 2012-12-02 DIAGNOSIS — M76899 Other specified enthesopathies of unspecified lower limb, excluding foot: Secondary | ICD-10-CM

## 2012-12-02 DIAGNOSIS — M7060 Trochanteric bursitis, unspecified hip: Secondary | ICD-10-CM

## 2012-12-02 MED ORDER — METHYLPREDNISOLONE ACETATE 80 MG/ML IJ SUSP
120.0000 mg | Freq: Once | INTRAMUSCULAR | Status: AC
  Administered 2012-12-02: 120 mg via INTRAMUSCULAR

## 2012-12-02 NOTE — Progress Notes (Signed)
  Subjective:    Patient ID: Deborah Bryant, female    DOB: 1946/04/22, 66 y.o.   MRN: [id number]  HPI Here for one week of pain in the left lateral hip area. No recent trauma. No pain in the back or down the leg. Using Diclofenac once a day and moist heat. Sitting and lying down are painful but walking is not.    Review of Systems  Constitutional: Negative.   Musculoskeletal: Positive for arthralgias. Negative for back pain and gait problem.       Objective:   Physical Exam  Constitutional: She appears well-developed and well-nourished. No distress.  Musculoskeletal:       Tender over the left greater trochanter. The spine and hips show full ROM           Assessment & Plan:  Rest, heat. Given a steroid shot. Increase Diclofenac to bid.

## 2012-12-02 NOTE — Addendum Note (Signed)
Addended by: Aniceto Boss A on: 12/02/2012 12:59 PM   Modules accepted: Orders

## 2013-02-01 ENCOUNTER — Telehealth: Payer: Self-pay | Admitting: Family Medicine

## 2013-02-01 NOTE — Telephone Encounter (Signed)
Patient called stating that she need a refill of her Phenyleph-Promethazine-Cod 5-6.25-10 MG/5ML SYRP Take 5 mLs by mouth 2 (two) times daily as needed. For Irritable Bowel Syndrome called into [facility] [place] [address]. Please assist.

## 2013-02-01 NOTE — Telephone Encounter (Signed)
Call in another 240 ml bottle with 5 rf

## 2013-02-02 ENCOUNTER — Other Ambulatory Visit: Payer: Self-pay | Admitting: *Deleted

## 2013-02-02 MED ORDER — PHENYLEPH-PROMETHAZINE-COD 5-6.25-10 MG/5ML PO SYRP: 5.0000 mL | ORAL_SOLUTION | Freq: Two times a day (BID) | ORAL | Status: DC | PRN

## 2013-06-04 ENCOUNTER — Telehealth: Payer: Self-pay | Admitting: Family Medicine

## 2013-06-04 MED ORDER — PHENYLEPH-PROMETHAZINE-COD 5-6.25-10 MG/5ML PO SYRP: 5.0000 mL | ORAL_SOLUTION | Freq: Two times a day (BID) | ORAL | Status: DC | PRN

## 2013-06-04 NOTE — Telephone Encounter (Signed)
I faxed the below script

## 2013-06-04 NOTE — Telephone Encounter (Signed)
Pt called to request a refill of her Phenyleph-Promethazine-Cod 5-6.25-10 MG/5ML SYRP. She stated that she would like it sent to [facility] on [address]. Please assist.

## 2013-06-04 NOTE — Telephone Encounter (Signed)
Okay for another 240 ml

## 2013-06-30 ENCOUNTER — Telehealth: Payer: Self-pay | Admitting: Family Medicine

## 2013-06-30 NOTE — Telephone Encounter (Signed)
Pt stated that it relieves pain in stomach and can we do some refills?

## 2013-06-30 NOTE — Telephone Encounter (Signed)
Phenyleph-Promethazine-Cod 5-6.25-10 MG/5ML SYRP call into [facility] [place] [place]

## 2013-07-02 MED ORDER — PHENYLEPH-PROMETHAZINE-COD 5-6.25-10 MG/5ML PO SYRP: 5.0000 mL | ORAL_SOLUTION | Freq: Two times a day (BID) | ORAL | Status: DC | PRN

## 2013-07-02 NOTE — Telephone Encounter (Signed)
Rx faxed to pharmacy  

## 2013-07-02 NOTE — Telephone Encounter (Signed)
Call in 240 ml with 2 rf

## 2013-07-21 ENCOUNTER — Ambulatory Visit (INDEPENDENT_AMBULATORY_CARE_PROVIDER_SITE_OTHER): Payer: Medicare Other | Admitting: Family Medicine

## 2013-07-21 ENCOUNTER — Other Ambulatory Visit: Payer: Self-pay

## 2013-07-21 ENCOUNTER — Encounter: Payer: Self-pay | Admitting: Family Medicine

## 2013-07-21 VITALS — BP 144/70 | HR 84 | Temp 98.1°F | Ht 62.75 in | Wt 178.0 lb

## 2013-07-21 DIAGNOSIS — I10 Essential (primary) hypertension: Secondary | ICD-10-CM

## 2013-07-21 DIAGNOSIS — M109 Gout, unspecified: Secondary | ICD-10-CM

## 2013-07-21 DIAGNOSIS — E119 Type 2 diabetes mellitus without complications: Secondary | ICD-10-CM

## 2013-07-21 DIAGNOSIS — Z0000 Encounter for general adult medical examination without abnormal findings: Secondary | ICD-10-CM

## 2013-07-21 LAB — POCT URINALYSIS DIPSTICK
Blood, UA: NEGATIVE
Ketones, UA: NEGATIVE
Leukocytes, UA: NEGATIVE
Protein, UA: NEGATIVE
pH, UA: 7

## 2013-07-21 LAB — CBC WITH DIFFERENTIAL/PLATELET
Basophils Absolute: 0.1 10*3/uL (ref 0.0–0.1)
Basophils Relative: 0.7 % (ref 0.0–3.0)
Eosinophils Absolute: 0.1 10*3/uL (ref 0.0–0.7)
HCT: 42.6 % (ref 36.0–46.0)
Hemoglobin: 14.3 g/dL (ref 12.0–15.0)
Lymphocytes Relative: 17.2 % (ref 12.0–46.0)
Lymphs Abs: 2 10*3/uL (ref 0.7–4.0)
MCHC: 33.5 g/dL (ref 30.0–36.0)
MCV: 88.8 fl (ref 78.0–100.0)
Monocytes Absolute: 0.8 10*3/uL (ref 0.1–1.0)
Neutro Abs: 8.5 10*3/uL — ABNORMAL HIGH (ref 1.4–7.7)
RBC: 4.8 Mil/uL (ref 3.87–5.11)
RDW: 13.8 % (ref 11.5–14.6)

## 2013-07-21 LAB — LIPID PANEL
Cholesterol: 143 mg/dL (ref 0–200)
LDL Cholesterol: 47 mg/dL (ref 0–99)

## 2013-07-21 LAB — BASIC METABOLIC PANEL
BUN: 16 mg/dL (ref 6–23)
Chloride: 102 mEq/L (ref 96–112)
Glucose, Bld: 106 mg/dL — ABNORMAL HIGH (ref 70–99)
Potassium: 4.2 mEq/L (ref 3.5–5.1)
Sodium: 138 mEq/L (ref 135–145)

## 2013-07-21 LAB — HEPATIC FUNCTION PANEL
ALT: 17 U/L (ref 0–35)
AST: 32 U/L (ref 0–37)
Alkaline Phosphatase: 108 U/L (ref 39–117)
Total Bilirubin: 0.4 mg/dL (ref 0.3–1.2)

## 2013-07-21 MED ORDER — GLIPIZIDE ER 5 MG PO TB24: 5.0000 mg | ORAL_TABLET | Freq: Every day | ORAL | Status: DC

## 2013-07-21 MED ORDER — AMLODIPINE BESY-BENAZEPRIL HCL 10-20 MG PO CAPS: 1.0000 | ORAL_CAPSULE | Freq: Every day | ORAL | Status: DC

## 2013-07-21 MED ORDER — ATORVASTATIN CALCIUM 40 MG PO TABS: 40.0000 mg | ORAL_TABLET | Freq: Every day | ORAL | Status: DC

## 2013-07-21 MED ORDER — FLUTICASONE PROPIONATE 50 MCG/ACT NA SUSP: 2.0000 | Freq: Two times a day (BID) | NASAL | Status: DC | PRN

## 2013-07-21 MED ORDER — DICLOFENAC SODIUM 75 MG PO TBEC: 75.0000 mg | DELAYED_RELEASE_TABLET | Freq: Two times a day (BID) | ORAL | Status: DC

## 2013-07-21 MED ORDER — HYDROCHLOROTHIAZIDE 25 MG PO TABS: 25.0000 mg | ORAL_TABLET | Freq: Every day | ORAL | Status: DC

## 2013-07-21 MED ORDER — DIAZEPAM 5 MG PO TABS: 5.0000 mg | ORAL_TABLET | Freq: Three times a day (TID) | ORAL | Status: DC | PRN

## 2013-07-21 MED ORDER — PHENYLEPH-PROMETHAZINE-COD 5-6.25-10 MG/5ML PO SYRP: 5.0000 mL | ORAL_SOLUTION | Freq: Two times a day (BID) | ORAL | Status: DC | PRN

## 2013-07-21 NOTE — Progress Notes (Signed)
  Subjective:    Patient ID: Deborah Bryant, female    DOB: 04-02-1946, 67 y.o.   MRN: [id number]  HPI 67 yr old female for a cpx. She is doing well in general except for her arthritis and her IBS. She has been under a lot of stress this past year from dealing her husband's health issues.    Review of Systems  Constitutional: Negative.   HENT: Negative.   Eyes: Negative.   Respiratory: Negative.   Cardiovascular: Negative.   Gastrointestinal: Negative.   Genitourinary: Negative for dysuria, urgency, frequency, hematuria, flank pain, decreased urine volume, enuresis, difficulty urinating, pelvic pain and dyspareunia.  Musculoskeletal: Negative.   Skin: Negative.   Neurological: Negative.   Psychiatric/Behavioral: Negative.        Objective:   Physical Exam  Constitutional: She is oriented to person, place, and time. She appears well-developed and well-nourished. No distress.  HENT:  Head: Normocephalic and atraumatic.  Right Ear: External ear normal.  Left Ear: External ear normal.  Nose: Nose normal.  Mouth/Throat: Oropharynx is clear and moist. No oropharyngeal exudate.  Eyes: Conjunctivae and EOM are normal. Pupils are equal, round, and reactive to light. No scleral icterus.  Neck: Normal range of motion. Neck supple. No JVD present. No thyromegaly present.  Cardiovascular: Normal rate, regular rhythm, normal heart sounds and intact distal pulses.  Exam reveals no gallop and no friction rub.   No murmur heard. EKG normal   Pulmonary/Chest: Effort normal and breath sounds normal. No respiratory distress. She has no wheezes. She has no rales. She exhibits no tenderness.  Abdominal: Soft. Bowel sounds are normal. She exhibits no distension and no mass. There is no tenderness. There is no rebound and no guarding.  Musculoskeletal: Normal range of motion. She exhibits no edema and no tenderness.  Lymphadenopathy:    She has no cervical adenopathy.  Neurological: She is alert and  oriented to person, place, and time. She has normal reflexes. No cranial nerve deficit. She exhibits normal muscle tone. Coordination normal.  Skin: Skin is warm and dry. No rash noted. No erythema.  Psychiatric: She has a normal mood and affect. Her behavior is normal. Judgment and thought content normal.          Assessment & Plan:  Well exam. Get fasting labs.

## 2013-07-27 ENCOUNTER — Telehealth: Payer: Self-pay | Admitting: Family Medicine

## 2013-07-27 NOTE — Telephone Encounter (Signed)
Patient had cpx last week and Dr. Clent Ridges refilled all meds. However, the hydrochlorothiazide (HYDRODIURIL) 25 MG tablet she always has taken BID. He wrote it for once daily, so she only received #90, but should be #180 for 90 days. Can you resend in, or contact [employer] to correct? Thanks.

## 2013-07-27 NOTE — Telephone Encounter (Signed)
I looked in pt's chart and it is now in there both ways once a day and twice a day. I spoke with pt and we will get Dr. Clent Ridges to confirm and then we can call [employer] and see if they can ship the difference, since pt has already received # 90 in the mail. Please let pt know when we handle this.

## 2013-07-29 ENCOUNTER — Encounter: Payer: Self-pay | Admitting: Family Medicine

## 2013-08-01 NOTE — Telephone Encounter (Signed)
Change this to bid #180 with 3 rf

## 2013-08-02 MED ORDER — HYDROCHLOROTHIAZIDE 25 MG PO TABS: 25.0000 mg | ORAL_TABLET | Freq: Two times a day (BID) | ORAL | Status: DC

## 2013-08-25 ENCOUNTER — Ambulatory Visit (INDEPENDENT_AMBULATORY_CARE_PROVIDER_SITE_OTHER): Payer: Medicare Other

## 2013-08-25 DIAGNOSIS — Z23 Encounter for immunization: Secondary | ICD-10-CM

## 2013-10-21 ENCOUNTER — Other Ambulatory Visit: Payer: Self-pay

## 2013-12-21 ENCOUNTER — Encounter: Payer: Self-pay | Admitting: Family Medicine

## 2013-12-21 ENCOUNTER — Ambulatory Visit (INDEPENDENT_AMBULATORY_CARE_PROVIDER_SITE_OTHER): Payer: Medicare Other | Admitting: Family Medicine

## 2013-12-21 VITALS — BP 136/68 | HR 78 | Temp 97.6°F | Wt 176.0 lb

## 2013-12-21 DIAGNOSIS — J069 Acute upper respiratory infection, unspecified: Secondary | ICD-10-CM

## 2013-12-21 MED ORDER — HYDROCOD POLST-CHLORPHEN POLST 10-8 MG/5ML PO LQCR: 5.0000 mL | Freq: Two times a day (BID) | ORAL | Status: DC | PRN

## 2013-12-21 NOTE — Progress Notes (Signed)
Pre visit review using our clinic review tool, if applicable. No additional management support is needed unless otherwise documented below in the visit note. 

## 2013-12-21 NOTE — Progress Notes (Signed)
   Subjective:    Patient ID: Deborah Bryant, female    DOB: 1946/04/25, 68 y.o.   MRN: [id number]  HPI Here for 5 days of sinus pressure, PND, fever and chest congestion. Cough is dry.    Review of Systems  Constitutional: Positive for fever.  HENT: Positive for congestion, postnasal drip and sinus pressure.   Eyes: Negative.   Respiratory: Negative.        Objective:   Physical Exam  Constitutional: She appears well-developed and well-nourished. No distress.  HENT:  Right Ear: External ear normal.  Left Ear: External ear normal.  Nose: Nose normal.  Mouth/Throat: Oropharynx is clear and moist.  Eyes: Conjunctivae are normal. Pupils are equal, round, and reactive to light.  Pulmonary/Chest: Effort normal and breath sounds normal.          Assessment & Plan:  Add Mucinex

## 2014-02-04 ENCOUNTER — Encounter: Payer: Self-pay | Admitting: Family Medicine

## 2014-02-04 ENCOUNTER — Ambulatory Visit (INDEPENDENT_AMBULATORY_CARE_PROVIDER_SITE_OTHER): Payer: Medicare Other | Admitting: Family Medicine

## 2014-02-04 VITALS — BP 118/60 | Temp 98.2°F | Ht 62.75 in | Wt 172.0 lb

## 2014-02-04 DIAGNOSIS — J019 Acute sinusitis, unspecified: Secondary | ICD-10-CM

## 2014-02-04 MED ORDER — HYDROCOD POLST-CHLORPHEN POLST 10-8 MG/5ML PO LQCR: 5.0000 mL | Freq: Two times a day (BID) | ORAL | Status: DC | PRN

## 2014-02-04 MED ORDER — AZITHROMYCIN 250 MG PO TABS
ORAL_TABLET | ORAL | Status: DC
Start: 2014-02-04 — End: 2014-07-27

## 2014-02-04 MED ORDER — HYDROCODONE-ACETAMINOPHEN 10-325 MG PO TABS: 1.0000 | ORAL_TABLET | Freq: Three times a day (TID) | ORAL | Status: DC | PRN

## 2014-02-04 NOTE — Progress Notes (Signed)
Pre visit review using our clinic review tool, if applicable. No additional management support is needed unless otherwise documented below in the visit note. 

## 2014-02-04 NOTE — Progress Notes (Signed)
   Subjective:    Patient ID: Deborah Bryant, female    DOB: 21-Jan-1946, 68 y.o.   MRN: [id number]  HPI Here for 2 days of sinus pressure, PND, and a dry cough. No fever.    Review of Systems  Constitutional: Negative.   HENT: Positive for congestion, postnasal drip and sinus pressure.   Eyes: Negative.   Respiratory: Positive for cough.        Objective:   Physical Exam  Constitutional: She appears well-developed and well-nourished.  HENT:  Right Ear: External ear normal.  Left Ear: External ear normal.  Nose: Nose normal.  Mouth/Throat: Oropharynx is clear and moist.  Eyes: Conjunctivae are normal.  Pulmonary/Chest: Effort normal and breath sounds normal.  Lymphadenopathy:    She has no cervical adenopathy.          Assessment & Plan:  Add Mucinex

## 2014-02-07 ENCOUNTER — Telehealth: Payer: Self-pay | Admitting: Family Medicine

## 2014-02-07 NOTE — Telephone Encounter (Signed)
Relevant patient education assigned to patient using Emmi. ° °

## 2014-03-10 ENCOUNTER — Encounter: Payer: Self-pay | Admitting: Family Medicine

## 2014-03-21 ENCOUNTER — Telehealth: Payer: Self-pay | Admitting: Family Medicine

## 2014-03-21 MED ORDER — PHENYLEPH-PROMETHAZINE-COD 5-6.25-10 MG/5ML PO SYRP: 5.0000 mL | ORAL_SOLUTION | Freq: Two times a day (BID) | ORAL | Status: DC | PRN

## 2014-03-21 NOTE — Telephone Encounter (Signed)
Pt needs refill on phenyleph-promethazine cod call into [facility] [place] [facility] [place]. Pt stated pharm now has med in stock

## 2014-03-21 NOTE — Telephone Encounter (Signed)
done

## 2014-03-21 NOTE — Telephone Encounter (Signed)
Pt takes this medication for irritable bowel syndrome. Can we refill for the 240 ml bottle? If not pt said she could wait until next week, if need be.

## 2014-03-24 ENCOUNTER — Other Ambulatory Visit: Payer: Self-pay

## 2014-03-28 ENCOUNTER — Telehealth: Payer: Self-pay | Admitting: Family Medicine

## 2014-03-28 NOTE — Telephone Encounter (Signed)
Done

## 2014-03-28 NOTE — Telephone Encounter (Signed)
Pt is having a gout attack and would like to see dr fry tommorow afternoon. Prefers to see him. OK to schedule?

## 2014-03-28 NOTE — Telephone Encounter (Signed)
yes

## 2014-03-29 ENCOUNTER — Ambulatory Visit (INDEPENDENT_AMBULATORY_CARE_PROVIDER_SITE_OTHER): Payer: Medicare Other | Admitting: Family Medicine

## 2014-03-29 ENCOUNTER — Encounter: Payer: Self-pay | Admitting: Family Medicine

## 2014-03-29 VITALS — BP 120/60 | HR 101 | Temp 97.9°F | Ht 62.75 in | Wt 170.0 lb

## 2014-03-29 DIAGNOSIS — M109 Gout, unspecified: Secondary | ICD-10-CM

## 2014-03-29 DIAGNOSIS — I10 Essential (primary) hypertension: Secondary | ICD-10-CM

## 2014-03-29 MED ORDER — METHYLPREDNISOLONE ACETATE 80 MG/ML IJ SUSP
120.0000 mg | Freq: Once | INTRAMUSCULAR | Status: AC
  Administered 2014-03-29: 120 mg via INTRAMUSCULAR

## 2014-03-29 MED ORDER — HYDROCODONE-ACETAMINOPHEN 10-325 MG PO TABS: 1.0000 | ORAL_TABLET | Freq: Three times a day (TID) | ORAL | Status: DC | PRN

## 2014-03-29 MED ORDER — ALLOPURINOL 300 MG PO TABS: 300.0000 mg | ORAL_TABLET | Freq: Every day | ORAL | Status: DC

## 2014-03-29 NOTE — Progress Notes (Signed)
Pre visit review using our clinic review tool, if applicable. No additional management support is needed unless otherwise documented below in the visit note. 

## 2014-03-29 NOTE — Progress Notes (Signed)
   Subjective:    Patient ID: Deborah Bryant, female    DOB: [birth year], 68 y.o.   MRN: [id number]  HPI Here to check a painful left index finger. This swelled up about 4 weeks ago,and it has not responded to heat or ice or Advil.    Review of Systems  Constitutional: Negative.   Musculoskeletal: Positive for arthralgias and joint swelling.       Objective:   Physical Exam  Constitutional: She appears well-developed and well-nourished.  Musculoskeletal:  Left index finger DIP joint is red, swollen, and tender. Several fingers have tophi over the IP joints           Assessment & Plan:  This is gout. Treat the acute episode with a steroid shot. Once the swelling goes down and she feels better, she will start on Allopurinol. Check labs in 6 months

## 2014-04-07 ENCOUNTER — Telehealth: Payer: Self-pay | Admitting: Family Medicine

## 2014-04-07 NOTE — Telephone Encounter (Signed)
Pt called and her gout is better but still red and swollen and painful pt wants to know if she needs to come in for an appt or do you want her to try something oral

## 2014-04-08 MED ORDER — INDOMETHACIN 50 MG PO CAPS: 50.0000 mg | ORAL_CAPSULE | Freq: Three times a day (TID) | ORAL | Status: DC | PRN

## 2014-04-08 NOTE — Telephone Encounter (Signed)
Call in Indomethacin 50 mg to take TID prn gout, #60 with 5 rf

## 2014-04-08 NOTE — Telephone Encounter (Signed)
I spoke with pt and sent script e-scribe. 

## 2014-04-18 ENCOUNTER — Encounter: Payer: Self-pay | Admitting: Family Medicine

## 2014-04-18 ENCOUNTER — Ambulatory Visit (INDEPENDENT_AMBULATORY_CARE_PROVIDER_SITE_OTHER): Payer: Medicare Other | Admitting: Family Medicine

## 2014-04-18 VITALS — BP 122/63 | HR 79 | Temp 98.4°F | Ht 62.75 in | Wt 170.0 lb

## 2014-04-18 DIAGNOSIS — M109 Gout, unspecified: Secondary | ICD-10-CM

## 2014-04-18 MED ORDER — PHENYLEPH-PROMETHAZINE-COD 5-6.25-10 MG/5ML PO SYRP: 5.0000 mL | ORAL_SOLUTION | Freq: Two times a day (BID) | ORAL | Status: DC | PRN

## 2014-04-18 MED ORDER — PREDNISONE 10 MG PO TABS: ORAL_TABLET | ORAL | Status: DC

## 2014-04-18 NOTE — Progress Notes (Signed)
Pre visit review using our clinic review tool, if applicable. No additional management support is needed unless otherwise documented below in the visit note. 

## 2014-04-18 NOTE — Progress Notes (Signed)
   Subjective:    Patient ID: Deborah Bryant, female    DOB: Jul 22, 1946, 68 y.o.   MRN: [id number]  HPI Here to follow up gout on the left index finger. She has been taking Indocin tid and this helps, but the finger is still painful and the meds are hard on her stomach.    Review of Systems  Constitutional: Negative.   Musculoskeletal: Positive for arthralgias and joint swelling.       Objective:   Physical Exam  Constitutional: She appears well-developed and well-nourished.  Musculoskeletal:  The left index DIP is swollen and tender but it is improved from the last exam           Assessment & Plan:  Try a prednisone taper and stop the Indocin.

## 2014-05-16 ENCOUNTER — Other Ambulatory Visit: Payer: Self-pay | Admitting: Family Medicine

## 2014-05-18 ENCOUNTER — Encounter: Payer: Self-pay | Admitting: Family Medicine

## 2014-05-19 NOTE — Telephone Encounter (Signed)
Script is ready for pick up and I left a voice message for pt. 

## 2014-05-19 NOTE — Telephone Encounter (Signed)
done

## 2014-06-16 DIAGNOSIS — H31001 Unspecified chorioretinal scars, right eye: Secondary | ICD-10-CM | POA: Insufficient documentation

## 2014-06-16 DIAGNOSIS — IMO0002 Reserved for concepts with insufficient information to code with codable children: Secondary | ICD-10-CM | POA: Insufficient documentation

## 2014-06-20 ENCOUNTER — Telehealth: Payer: Self-pay | Admitting: Family Medicine

## 2014-06-20 NOTE — Telephone Encounter (Signed)
Pt req rx PROMETHAZINE VC/CODEINE 6.25-5-10 MG/5ML SYRP

## 2014-06-22 MED ORDER — PHENYLEPH-PROMETHAZINE-COD 5-6.25-10 MG/5ML PO SYRP: ORAL_SOLUTION | ORAL | Status: DC

## 2014-06-22 NOTE — Telephone Encounter (Signed)
done

## 2014-06-22 NOTE — Telephone Encounter (Signed)
Script is ready for pick up and I left a voice message for pt. 

## 2014-07-25 ENCOUNTER — Encounter: Payer: Self-pay | Admitting: Family Medicine

## 2014-07-27 ENCOUNTER — Encounter: Payer: Self-pay | Admitting: Family Medicine

## 2014-07-27 ENCOUNTER — Ambulatory Visit (INDEPENDENT_AMBULATORY_CARE_PROVIDER_SITE_OTHER): Payer: Medicare Other | Admitting: Family Medicine

## 2014-07-27 VITALS — BP 135/60 | HR 82 | Temp 98.0°F | Ht 62.5 in | Wt 165.0 lb

## 2014-07-27 DIAGNOSIS — Z0000 Encounter for general adult medical examination without abnormal findings: Secondary | ICD-10-CM

## 2014-07-27 DIAGNOSIS — R799 Abnormal finding of blood chemistry, unspecified: Secondary | ICD-10-CM

## 2014-07-27 DIAGNOSIS — M109 Gout, unspecified: Secondary | ICD-10-CM

## 2014-07-27 DIAGNOSIS — E785 Hyperlipidemia, unspecified: Secondary | ICD-10-CM

## 2014-07-27 DIAGNOSIS — E119 Type 2 diabetes mellitus without complications: Secondary | ICD-10-CM

## 2014-07-27 LAB — MICROALBUMIN / CREATININE URINE RATIO
Creatinine,U: 231.2 mg/dL
MICROALB UR: 3.2 mg/dL — AB (ref 0.0–1.9)
MICROALB/CREAT RATIO: 1.4 mg/g (ref 0.0–30.0)

## 2014-07-27 LAB — BASIC METABOLIC PANEL
BUN: 16 mg/dL (ref 6–23)
CHLORIDE: 102 meq/L (ref 96–112)
CO2: 28 mEq/L (ref 19–32)
Calcium: 9.8 mg/dL (ref 8.4–10.5)
Creatinine, Ser: 0.8 mg/dL (ref 0.4–1.2)
GFR: 74.75 mL/min (ref >60.00)
Glucose, Bld: 121 mg/dL — ABNORMAL HIGH (ref 70–99)
POTASSIUM: 3.8 meq/L (ref 3.5–5.1)
SODIUM: 139 meq/L (ref 135–145)

## 2014-07-27 LAB — POCT URINALYSIS DIPSTICK
Bilirubin, UA: NEGATIVE
Glucose, UA: NEGATIVE
KETONES UA: NEGATIVE
LEUKOCYTES UA: NEGATIVE
Nitrite, UA: NEGATIVE
PH UA: 6
RBC UA: NEGATIVE
Spec Grav, UA: 1.015
Urobilinogen, UA: 0.2

## 2014-07-27 LAB — CBC WITH DIFFERENTIAL/PLATELET
BASOS ABS: 0.5 10*3/uL — AB (ref 0.0–0.1)
BASOS PCT: 4.3 % — AB (ref 0.0–3.0)
EOS ABS: 0.1 10*3/uL (ref 0.0–0.7)
Eosinophils Relative: 0.9 % (ref 0.0–5.0)
HCT: 44.6 % (ref 36.0–46.0)
HEMOGLOBIN: 14.8 g/dL (ref 12.0–15.0)
LYMPHS PCT: 9.1 % — AB (ref 12.0–46.0)
Lymphs Abs: 1.2 10*3/uL (ref 0.7–4.0)
MCHC: 33.3 g/dL (ref 30.0–36.0)
MCV: 91 fl (ref 78.0–100.0)
MONO ABS: 0.9 10*3/uL (ref 0.1–1.0)
Monocytes Relative: 7.3 % (ref 3.0–12.0)
NEUTROS ABS: 10 10*3/uL — AB (ref 1.4–7.7)
Neutrophils Relative %: 78.4 % — ABNORMAL HIGH (ref 43.0–77.0)
Platelets: 306 10*3/uL (ref 150.0–400.0)
RBC: 4.9 Mil/uL (ref 3.87–5.11)
RDW: 13.8 % (ref 11.5–15.5)
WBC: 12.8 10*3/uL — ABNORMAL HIGH (ref 4.0–10.5)

## 2014-07-27 LAB — HEPATIC FUNCTION PANEL
ALK PHOS: 102 U/L (ref 39–117)
ALT: 22 U/L (ref 0–35)
AST: 22 U/L (ref 0–37)
Albumin: 4.5 g/dL (ref 3.5–5.2)
Bilirubin, Direct: 0.1 mg/dL (ref 0.0–0.3)
Total Bilirubin: 0.6 mg/dL (ref 0.2–1.2)
Total Protein: 7.5 g/dL (ref 6.0–8.3)

## 2014-07-27 LAB — LIPID PANEL
Cholesterol: 153 mg/dL (ref 0–200)
HDL: 59.3 mg/dL (ref >39.00)
NonHDL: 93.7
Total CHOL/HDL Ratio: 3
Triglycerides: 219 mg/dL — ABNORMAL HIGH (ref 0.0–149.0)
VLDL: 43.8 mg/dL — ABNORMAL HIGH (ref 0.0–40.0)

## 2014-07-27 LAB — URIC ACID: Uric Acid, Serum: 7.7 mg/dL — ABNORMAL HIGH (ref 2.4–7.0)

## 2014-07-27 LAB — LDL CHOLESTEROL, DIRECT: Direct LDL: 64.2 mg/dL

## 2014-07-27 LAB — TSH: TSH: 3.21 u[IU]/mL (ref 0.35–4.50)

## 2014-07-27 LAB — HEMOGLOBIN A1C: Hgb A1c MFr Bld: 6.2 % (ref 4.6–6.5)

## 2014-07-27 MED ORDER — ATORVASTATIN CALCIUM 40 MG PO TABS: 40.0000 mg | ORAL_TABLET | Freq: Every day | ORAL | Status: DC

## 2014-07-27 MED ORDER — DIAZEPAM 5 MG PO TABS: 5.0000 mg | ORAL_TABLET | Freq: Three times a day (TID) | ORAL | Status: DC | PRN

## 2014-07-27 MED ORDER — HYDROCODONE-ACETAMINOPHEN 10-325 MG PO TABS: 1.0000 | ORAL_TABLET | Freq: Three times a day (TID) | ORAL | Status: DC | PRN

## 2014-07-27 MED ORDER — AMLODIPINE BESY-BENAZEPRIL HCL 10-20 MG PO CAPS: 1.0000 | ORAL_CAPSULE | Freq: Every day | ORAL | Status: DC

## 2014-07-27 MED ORDER — DICLOFENAC SODIUM 75 MG PO TBEC
75.0000 mg | DELAYED_RELEASE_TABLET | Freq: Two times a day (BID) | ORAL | Status: DC | PRN
Start: 2014-07-27 — End: 2015-08-01

## 2014-07-27 MED ORDER — HYDROCHLOROTHIAZIDE 25 MG PO TABS: 25.0000 mg | ORAL_TABLET | Freq: Two times a day (BID) | ORAL | Status: DC

## 2014-07-27 MED ORDER — GLIPIZIDE ER 5 MG PO TB24: 5.0000 mg | ORAL_TABLET | Freq: Every day | ORAL | Status: DC

## 2014-07-27 MED ORDER — PHENYLEPH-PROMETHAZINE-COD 5-6.25-10 MG/5ML PO SYRP: ORAL_SOLUTION | ORAL | Status: DC

## 2014-07-27 MED ORDER — ALLOPURINOL 300 MG PO TABS: 300.0000 mg | ORAL_TABLET | Freq: Every day | ORAL | Status: DC

## 2014-07-27 NOTE — Progress Notes (Signed)
   Subjective:    Patient ID: Deborah Bryant, female    DOB: [birth year], 68 y.o.   MRN: [id number]  HPI 68 yr old female for a cpx. She feels well except her plantar fasciitis is acting up.    Review of Systems  Constitutional: Negative.   HENT: Negative.   Eyes: Negative.   Respiratory: Negative.   Cardiovascular: Negative.   Gastrointestinal: Negative.   Genitourinary: Negative for dysuria, urgency, frequency, hematuria, flank pain, decreased urine volume, enuresis, difficulty urinating, pelvic pain and dyspareunia.  Musculoskeletal: Negative.   Skin: Negative.   Neurological: Negative.   Psychiatric/Behavioral: Negative.        Objective:   Physical Exam  Constitutional: She is oriented to person, place, and time. She appears well-developed and well-nourished. No distress.  HENT:  Head: Normocephalic and atraumatic.  Right Ear: External ear normal.  Left Ear: External ear normal.  Nose: Nose normal.  Mouth/Throat: Oropharynx is clear and moist. No oropharyngeal exudate.  Eyes: Conjunctivae and EOM are normal. Pupils are equal, round, and reactive to light. No scleral icterus.  Neck: Normal range of motion. Neck supple. No JVD present. No thyromegaly present.  Cardiovascular: Normal rate, regular rhythm, normal heart sounds and intact distal pulses.  Exam reveals no gallop and no friction rub.   No murmur heard. EKG normal   Pulmonary/Chest: Effort normal and breath sounds normal. No respiratory distress. She has no wheezes. She has no rales. She exhibits no tenderness.  Abdominal: Soft. Bowel sounds are normal. She exhibits no distension and no mass. There is no tenderness. There is no rebound and no guarding.  Musculoskeletal: Normal range of motion. She exhibits no edema and no tenderness.  Lymphadenopathy:    She has no cervical adenopathy.  Neurological: She is alert and oriented to person, place, and time. She has normal reflexes. No cranial nerve deficit. She exhibits  normal muscle tone. Coordination normal.  Skin: Skin is warm and dry. No rash noted. No erythema.  Psychiatric: She has a normal mood and affect. Her behavior is normal. Judgment and thought content normal.          Assessment & Plan:  Well exam. Get fasting labs. She needs to wear supportive shoes with arch inserts.

## 2014-07-27 NOTE — Progress Notes (Signed)
Pre visit review using our clinic review tool, if applicable. No additional management support is needed unless otherwise documented below in the visit note. 

## 2014-08-19 ENCOUNTER — Ambulatory Visit (INDEPENDENT_AMBULATORY_CARE_PROVIDER_SITE_OTHER): Payer: Medicare Other | Admitting: Family Medicine

## 2014-08-19 DIAGNOSIS — Z23 Encounter for immunization: Secondary | ICD-10-CM

## 2014-09-06 ENCOUNTER — Telehealth: Payer: Self-pay | Admitting: Family Medicine

## 2014-09-06 MED ORDER — PHENYLEPH-PROMETHAZINE-COD 5-6.25-10 MG/5ML PO SYRP: ORAL_SOLUTION | ORAL | Status: DC

## 2014-09-06 NOTE — Telephone Encounter (Signed)
done

## 2014-10-12 ENCOUNTER — Ambulatory Visit (INDEPENDENT_AMBULATORY_CARE_PROVIDER_SITE_OTHER): Payer: Medicare Other | Admitting: Family Medicine

## 2014-10-12 ENCOUNTER — Encounter: Payer: Self-pay | Admitting: Family Medicine

## 2014-10-12 VITALS — BP 121/63 | HR 73 | Temp 97.7°F | Ht 62.5 in | Wt 164.0 lb

## 2014-10-12 DIAGNOSIS — N760 Acute vaginitis: Secondary | ICD-10-CM

## 2014-10-12 DIAGNOSIS — R300 Dysuria: Secondary | ICD-10-CM

## 2014-10-12 LAB — POCT URINALYSIS DIPSTICK
Bilirubin, UA: NEGATIVE
Blood, UA: NEGATIVE
GLUCOSE UA: NEGATIVE
NITRITE UA: NEGATIVE
Protein, UA: NEGATIVE
Spec Grav, UA: 1.015
Urobilinogen, UA: 0.2
pH, UA: 6

## 2014-10-12 MED ORDER — TERCONAZOLE 0.4 % VA CREA: 1.0000 | TOPICAL_CREAM | Freq: Every day | VAGINAL | Status: DC

## 2014-10-12 MED ORDER — PHENYLEPH-PROMETHAZINE-COD 5-6.25-10 MG/5ML PO SYRP: ORAL_SOLUTION | ORAL | Status: DC

## 2014-10-12 MED ORDER — FLUCONAZOLE 150 MG PO TABS: 150.0000 mg | ORAL_TABLET | Freq: Every day | ORAL | Status: DC

## 2014-10-12 NOTE — Progress Notes (Signed)
   Subjective:    Patient ID: Deborah Bryant, female    DOB: February 11, 1946, 68 y.o.   MRN: [id number]  HPI Here for several days of itching in the vaginal area and burning on urination. No urgency or frequency.    Review of Systems  Constitutional: Negative.   Genitourinary: Positive for dysuria. Negative for urgency, frequency and hematuria.       Objective:   Physical Exam  Constitutional: She appears well-developed and well-nourished.          Assessment & Plan:  Treat with topical Terazol and oral Diflucan.

## 2014-10-12 NOTE — Progress Notes (Signed)
Pre visit review using our clinic review tool, if applicable. No additional management support is needed unless otherwise documented below in the visit note. 

## 2014-11-14 ENCOUNTER — Telehealth: Payer: Self-pay | Admitting: Family Medicine

## 2014-11-14 NOTE — Telephone Encounter (Signed)
Pt requesting refill of Phenyleph-Promethazine-Cod (PROMETHAZINE VC/CODEINE) 5-6.25-10 MG/5ML SYRP.

## 2014-11-14 NOTE — Telephone Encounter (Signed)
Per Dr. Fry, okay to refill. 

## 2014-11-15 MED ORDER — PHENYLEPH-PROMETHAZINE-COD 5-6.25-10 MG/5ML PO SYRP: ORAL_SOLUTION | ORAL | Status: DC

## 2014-11-15 NOTE — Telephone Encounter (Signed)
Script is ready for pick up and I left a voice message for pt. 

## 2014-12-19 ENCOUNTER — Telehealth: Payer: Self-pay | Admitting: Family Medicine

## 2014-12-19 MED ORDER — PHENYLEPH-PROMETHAZINE-COD 5-6.25-10 MG/5ML PO SYRP: ORAL_SOLUTION | ORAL | Status: DC

## 2014-12-19 NOTE — Telephone Encounter (Signed)
done

## 2014-12-19 NOTE — Telephone Encounter (Signed)
rx up front for p/u, pt aware

## 2014-12-19 NOTE — Telephone Encounter (Signed)
Needs a refill on her prometh/cod syrup to pick up please.

## 2014-12-27 ENCOUNTER — Telehealth: Payer: Self-pay | Admitting: Family Medicine

## 2014-12-27 MED ORDER — BENAZEPRIL HCL 20 MG PO TABS: 20.0000 mg | ORAL_TABLET | Freq: Every day | ORAL | Status: DC

## 2014-12-27 MED ORDER — AMLODIPINE BESYLATE 10 MG PO TABS: 10.0000 mg | ORAL_TABLET | Freq: Every day | ORAL | Status: DC

## 2014-12-27 NOTE — Telephone Encounter (Signed)
[employer] asks that Lotrel be split into two medications

## 2015-01-18 ENCOUNTER — Telehealth: Payer: Self-pay | Admitting: Family Medicine

## 2015-01-18 NOTE — Telephone Encounter (Signed)
Patient need re-fill on Phenyleph-Promethazine-Cod (PROMETHAZINE VC/CODEINE) 5-6.25-10 MG/5ML SYRP

## 2015-01-20 MED ORDER — PHENYLEPH-PROMETHAZINE-COD 5-6.25-10 MG/5ML PO SYRP: ORAL_SOLUTION | ORAL | Status: DC

## 2015-01-20 NOTE — Telephone Encounter (Signed)
done

## 2015-01-20 NOTE — Telephone Encounter (Signed)
Script is ready for pick up and I spoke with pt.  

## 2015-02-20 ENCOUNTER — Telehealth: Payer: Self-pay | Admitting: Family Medicine

## 2015-02-20 MED ORDER — PHENYLEPH-PROMETHAZINE-COD 5-6.25-10 MG/5ML PO SYRP: ORAL_SOLUTION | ORAL | Status: DC

## 2015-02-20 NOTE — Telephone Encounter (Signed)
done

## 2015-03-21 ENCOUNTER — Telehealth: Payer: Self-pay | Admitting: Family Medicine

## 2015-03-21 NOTE — Telephone Encounter (Signed)
Pt request refill of the following:  Phenyleph-Promethazine-Cod (PROMETHAZINE VC/CODEINE) 5-6.25-10 MG/5ML SYRP  Phamacy:

## 2015-03-24 MED ORDER — PHENYLEPH-PROMETHAZINE-COD 5-6.25-10 MG/5ML PO SYRP: ORAL_SOLUTION | ORAL | Status: DC

## 2015-03-24 NOTE — Telephone Encounter (Signed)
Script is ready for pick up and I spoke with pt.  

## 2015-03-24 NOTE — Telephone Encounter (Signed)
done

## 2015-03-24 NOTE — Telephone Encounter (Signed)
Pt following up on med request. Would like to pu early afternoon if possible.

## 2015-04-24 ENCOUNTER — Other Ambulatory Visit: Payer: Self-pay | Admitting: Family Medicine

## 2015-04-24 MED ORDER — PHENYLEPH-PROMETHAZINE-COD 5-6.25-10 MG/5ML PO SYRP: ORAL_SOLUTION | ORAL | Status: DC

## 2015-04-24 NOTE — Telephone Encounter (Signed)
Pt request refill Phenyleph-Promethazine-Cod (PROMETHAZINE VC/CODEINE) 5-6.25-10 MG/5ML SYRP

## 2015-04-24 NOTE — Telephone Encounter (Signed)
Pt aware.

## 2015-04-24 NOTE — Telephone Encounter (Signed)
done

## 2015-04-27 ENCOUNTER — Telehealth: Payer: Self-pay | Admitting: Family Medicine

## 2015-04-27 NOTE — Telephone Encounter (Signed)
[facility] Day - Client [place] [facility] Patient Name: Deborah Bryant DOB: 06/09/1946 Initial Comment caller states her BP has been low and she has had some dizziness Nurse Assessment Nurse: Markus Daft, RN, Sherre Poot Date/Time (Eastern Time): 04/27/2015 8:49:23 AM Confirm and document reason for call. If symptomatic, describe symptoms. ---Caller states her BP has been low, and she has had some dizziness off/on for 2 wks. Denies dizziness now. Has had HTN for years. BP 120/55 at drug store almost a month ago. At home yesterday AM 120/58. Denies s/s presently. She wanted appt to speak with MD about her BP tomorrow. Has the patient traveled out of the country within the last 30 days? ---Not Applicable Does the patient require triage? ---Yes Related visit to physician within the last 2 weeks? ---No Does the PT have any chronic conditions? (i.e. diabetes, asthma, etc.) ---Yes List chronic conditions. ---NIDDM, Gout, HTN Guidelines Guideline Title Affirmed Question Affirmed Notes Dizziness - Lightheadedness Taking a medicine that could cause dizziness (e.g., blood pressure medications, diuretics) Final Disposition User See Physician within [place], [place], [place] Comments Appt made with Dr. Barbee Cough for 04/28/15 at 9:15 am.

## 2015-04-28 ENCOUNTER — Encounter: Payer: Self-pay | Admitting: Family Medicine

## 2015-04-28 ENCOUNTER — Ambulatory Visit (INDEPENDENT_AMBULATORY_CARE_PROVIDER_SITE_OTHER): Payer: Medicare Other | Admitting: Family Medicine

## 2015-04-28 VITALS — BP 137/66 | HR 77 | Temp 98.4°F | Ht 62.5 in | Wt 158.0 lb

## 2015-04-28 DIAGNOSIS — R05 Cough: Secondary | ICD-10-CM | POA: Diagnosis not present

## 2015-04-28 DIAGNOSIS — I951 Orthostatic hypotension: Secondary | ICD-10-CM

## 2015-04-28 DIAGNOSIS — R059 Cough, unspecified: Secondary | ICD-10-CM

## 2015-04-28 DIAGNOSIS — I10 Essential (primary) hypertension: Secondary | ICD-10-CM

## 2015-04-28 MED ORDER — LOSARTAN POTASSIUM 100 MG PO TABS: 100.0000 mg | ORAL_TABLET | Freq: Every day | ORAL | Status: DC

## 2015-04-28 MED ORDER — HYDROCHLOROTHIAZIDE 25 MG PO TABS: 25.0000 mg | ORAL_TABLET | Freq: Every day | ORAL | Status: DC

## 2015-04-28 NOTE — Progress Notes (Signed)
   Subjective:    Patient ID: Deborah Bryant, female    DOB: 02/12/1946, 69 y.o.   MRN: [id number]  HPI Here for several issues. First of all her BP has been running low for the past month. She often gets readings in the 110s or 120s over 50s. She often feels lightheaded for a few seconds when she stands up quickly.  No vertigo. No SOB or chest pains. Also she has developed a tickling cough in the throat in the past month.    Review of Systems  Constitutional: Negative.   HENT: Negative.   Respiratory: Positive for cough. Negative for chest tightness, shortness of breath and wheezing.   Cardiovascular: Negative.   Neurological: Positive for light-headedness. Negative for dizziness and headaches.       Objective:   Physical Exam  Constitutional: She is oriented to person, place, and time. She appears well-developed and well-nourished.  Neck: No thyromegaly present.  Cardiovascular: Normal rate, regular rhythm, normal heart sounds and intact distal pulses.   Pulmonary/Chest: Effort normal and breath sounds normal.  Lymphadenopathy:    She has no cervical adenopathy.  Neurological: She is alert and oriented to person, place, and time. No cranial nerve deficit.          Assessment & Plan:  Her cough is probably related to her ACE inhibitor, so we will stop Benazepril and start on Losartan 100 mg daily. She is having some orthostasis, and this is probably a side effect of her CCB. She has lost a good deal of weight so I think we can stop her Amlodipine altogether. Decrease the HCTZ to once a day. Recheck in one month

## 2015-04-28 NOTE — Progress Notes (Signed)
Pre visit review using our clinic review tool, if applicable. No additional management support is needed unless otherwise documented below in the visit note. 

## 2015-05-01 LAB — HM DIABETES EYE EXAM

## 2015-05-18 ENCOUNTER — Encounter: Payer: Self-pay | Admitting: Family Medicine

## 2015-05-19 ENCOUNTER — Telehealth: Payer: Self-pay

## 2015-05-19 NOTE — Telephone Encounter (Signed)
Spoke with pt, Dr. Sarajane Jews said to call next week for appt to discuss issues with her new medication.

## 2015-05-19 NOTE — Telephone Encounter (Signed)
See me next week to discuss this

## 2015-05-22 ENCOUNTER — Ambulatory Visit (INDEPENDENT_AMBULATORY_CARE_PROVIDER_SITE_OTHER): Payer: Medicare Other | Admitting: Family Medicine

## 2015-05-22 ENCOUNTER — Encounter: Payer: Self-pay | Admitting: Family Medicine

## 2015-05-22 VITALS — BP 140/70 | HR 88 | Temp 98.7°F | Wt 162.3 lb

## 2015-05-22 DIAGNOSIS — E038 Other specified hypothyroidism: Secondary | ICD-10-CM

## 2015-05-22 DIAGNOSIS — E119 Type 2 diabetes mellitus without complications: Secondary | ICD-10-CM | POA: Diagnosis not present

## 2015-05-22 DIAGNOSIS — I10 Essential (primary) hypertension: Secondary | ICD-10-CM | POA: Diagnosis not present

## 2015-05-22 DIAGNOSIS — N183 Chronic kidney disease, stage 3 (moderate): Secondary | ICD-10-CM

## 2015-05-22 MED ORDER — PHENYLEPH-PROMETHAZINE-COD 5-6.25-10 MG/5ML PO SYRP: ORAL_SOLUTION | ORAL | Status: DC

## 2015-05-22 MED ORDER — DILTIAZEM HCL ER 180 MG PO CP24: 180.0000 mg | ORAL_CAPSULE | Freq: Every day | ORAL | Status: DC

## 2015-05-22 NOTE — Progress Notes (Signed)
   Subjective:    Patient ID: Deborah Bryant, female    DOB: 10-Mar-1946, 69 y.o.   MRN: [id number]  HPI Here to follow up on HTN and on fatigue. For the past 4 weeks or so she has felt extremely tired all the time and she relates this ti he BP meds. At our last visit we switched from amlodipine to losartan and we decreased her HCTZ to 25 mg daily. Since then her BP varies from 196 to 222 systolic.    Review of Systems  Constitutional: Positive for fatigue.  Respiratory: Negative.   Cardiovascular: Negative.   Gastrointestinal: Negative.   Neurological: Negative.        Objective:   Physical Exam  Constitutional: She appears well-developed and well-nourished.  Cardiovascular: Normal rate, regular rhythm, normal heart sounds and intact distal pulses.   Pulmonary/Chest: Effort normal and breath sounds normal.          Assessment & Plan:  Her fatigue could still be from side effects of BP meds so we will stop the Losartan and switch to Diltiazem ER 180 mg daily. Get labs today. Recheck in 2 weeks

## 2015-05-22 NOTE — Progress Notes (Signed)
Pre visit review using our clinic review tool, if applicable. No additional management support is needed unless otherwise documented below in the visit note. 

## 2015-05-23 ENCOUNTER — Telehealth: Payer: Self-pay

## 2015-05-23 ENCOUNTER — Encounter: Payer: Self-pay | Admitting: Family Medicine

## 2015-05-23 LAB — BASIC METABOLIC PANEL
BUN: 22 mg/dL (ref 6–23)
CO2: 27 meq/L (ref 19–32)
Calcium: 9.5 mg/dL (ref 8.4–10.5)
Chloride: 103 mEq/L (ref 96–112)
Creatinine, Ser: 1.33 mg/dL — ABNORMAL HIGH (ref 0.40–1.20)
GFR: 42.08 mL/min — ABNORMAL LOW (ref >60.00)
Glucose, Bld: 89 mg/dL (ref 70–99)
POTASSIUM: 3.7 meq/L (ref 3.5–5.1)
Sodium: 138 mEq/L (ref 135–145)

## 2015-05-23 LAB — TSH: TSH: 0.88 u[IU]/mL (ref 0.35–4.50)

## 2015-05-23 LAB — HEMOGLOBIN A1C: HEMOGLOBIN A1C: 5.9 % (ref 4.6–6.5)

## 2015-05-23 NOTE — Addendum Note (Signed)
Addended by: Alysia Penna A on: 05/23/2015 01:08 PM   Modules accepted: Orders

## 2015-05-23 NOTE — Telephone Encounter (Signed)
x

## 2015-05-29 ENCOUNTER — Encounter: Payer: Self-pay | Admitting: Family Medicine

## 2015-05-29 NOTE — Telephone Encounter (Signed)
Tell her that this type of renal problem progresses very slowly over years, so waiting 3 months for an appt is no problem

## 2015-06-11 DIAGNOSIS — J418 Mixed simple and mucopurulent chronic bronchitis: Secondary | ICD-10-CM | POA: Insufficient documentation

## 2015-06-22 ENCOUNTER — Encounter (HOSPITAL_COMMUNITY): Payer: Self-pay

## 2015-06-22 ENCOUNTER — Emergency Department (HOSPITAL_COMMUNITY): Payer: Medicare Other

## 2015-06-22 ENCOUNTER — Emergency Department (HOSPITAL_COMMUNITY)
Admission: EM | Admit: 2015-06-22 | Discharge: 2015-06-22 | Disposition: A | Discharge status: Home / Self Care | Payer: Medicare Other | Attending: Emergency Medicine | Admitting: Emergency Medicine

## 2015-06-22 DIAGNOSIS — M1990 Unspecified osteoarthritis, unspecified site: Secondary | ICD-10-CM | POA: Diagnosis not present

## 2015-06-22 DIAGNOSIS — Z8709 Personal history of other diseases of the respiratory system: Secondary | ICD-10-CM | POA: Diagnosis not present

## 2015-06-22 DIAGNOSIS — R110 Nausea: Secondary | ICD-10-CM | POA: Diagnosis not present

## 2015-06-22 DIAGNOSIS — Z8701 Personal history of pneumonia (recurrent): Secondary | ICD-10-CM | POA: Insufficient documentation

## 2015-06-22 DIAGNOSIS — E785 Hyperlipidemia, unspecified: Secondary | ICD-10-CM | POA: Insufficient documentation

## 2015-06-22 DIAGNOSIS — R0602 Shortness of breath: Secondary | ICD-10-CM | POA: Diagnosis present

## 2015-06-22 DIAGNOSIS — E119 Type 2 diabetes mellitus without complications: Secondary | ICD-10-CM | POA: Diagnosis not present

## 2015-06-22 DIAGNOSIS — Z79899 Other long term (current) drug therapy: Secondary | ICD-10-CM | POA: Insufficient documentation

## 2015-06-22 DIAGNOSIS — R21 Rash and other nonspecific skin eruption: Secondary | ICD-10-CM | POA: Insufficient documentation

## 2015-06-22 DIAGNOSIS — Z720 Tobacco use: Secondary | ICD-10-CM | POA: Insufficient documentation

## 2015-06-22 DIAGNOSIS — Z86018 Personal history of other benign neoplasm: Secondary | ICD-10-CM | POA: Diagnosis not present

## 2015-06-22 DIAGNOSIS — Z8742 Personal history of other diseases of the female genital tract: Secondary | ICD-10-CM | POA: Diagnosis not present

## 2015-06-22 DIAGNOSIS — R531 Weakness: Secondary | ICD-10-CM | POA: Diagnosis not present

## 2015-06-22 DIAGNOSIS — Z8614 Personal history of Methicillin resistant Staphylococcus aureus infection: Secondary | ICD-10-CM | POA: Insufficient documentation

## 2015-06-22 DIAGNOSIS — Z7951 Long term (current) use of inhaled steroids: Secondary | ICD-10-CM | POA: Diagnosis not present

## 2015-06-22 DIAGNOSIS — Z8719 Personal history of other diseases of the digestive system: Secondary | ICD-10-CM | POA: Diagnosis not present

## 2015-06-22 DIAGNOSIS — I10 Essential (primary) hypertension: Secondary | ICD-10-CM | POA: Insufficient documentation

## 2015-06-22 DIAGNOSIS — Z7982 Long term (current) use of aspirin: Secondary | ICD-10-CM | POA: Insufficient documentation

## 2015-06-22 DIAGNOSIS — R0789 Other chest pain: Secondary | ICD-10-CM | POA: Diagnosis not present

## 2015-06-22 HISTORY — DX: Pneumonia, unspecified organism: J18.9

## 2015-06-22 LAB — BASIC METABOLIC PANEL
Anion gap: 10 (ref 5–15)
BUN: 6 mg/dL (ref 6–20)
CALCIUM: 9.1 mg/dL (ref 8.9–10.3)
CO2: 22 mmol/L (ref 22–32)
Chloride: 100 mmol/L — ABNORMAL LOW (ref 101–111)
Creatinine, Ser: 0.87 mg/dL (ref 0.44–1.00)
GFR calc non Af Amer: >60 mL/min (ref >60)
GLUCOSE: 179 mg/dL — AB (ref 65–99)
Potassium: 3.7 mmol/L (ref 3.5–5.1)
SODIUM: 132 mmol/L — AB (ref 135–145)

## 2015-06-22 LAB — I-STAT TROPONIN, ED: Troponin i, poc: 0 ng/mL (ref 0.00–0.08)

## 2015-06-22 LAB — CBC
HCT: 34.9 % — ABNORMAL LOW (ref 36.0–46.0)
Hemoglobin: 11.8 g/dL — ABNORMAL LOW (ref 12.0–15.0)
MCH: 29.9 pg (ref 26.0–34.0)
MCHC: 33.8 g/dL (ref 30.0–36.0)
MCV: 88.6 fL (ref 78.0–100.0)
PLATELETS: 410 10*3/uL — AB (ref 150–400)
RBC: 3.94 MIL/uL (ref 3.87–5.11)
RDW: 14.3 % (ref 11.5–15.5)
WBC: 12.8 10*3/uL — ABNORMAL HIGH (ref 4.0–10.5)

## 2015-06-22 LAB — BRAIN NATRIURETIC PEPTIDE: B Natriuretic Peptide: 65.8 pg/mL (ref 0.0–100.0)

## 2015-06-22 MED ORDER — PREDNISONE 20 MG PO TABS
40.0000 mg | ORAL_TABLET | ORAL | Status: AC
  Administered 2015-06-22: 40 mg via ORAL
  Filled 2015-06-22: qty 2

## 2015-06-22 MED ORDER — PREDNISONE 20 MG PO TABS: 40.0000 mg | ORAL_TABLET | Freq: Every day | ORAL | Status: AC

## 2015-06-22 MED ORDER — ALBUTEROL SULFATE (2.5 MG/3ML) 0.083% IN NEBU: 2.5000 mg | INHALATION_SOLUTION | Freq: Four times a day (QID) | RESPIRATORY_TRACT | Status: DC | PRN

## 2015-06-22 NOTE — ED Provider Notes (Signed)
CSN: [id number]     Arrival date & time 06/22/15  1109 History   First MD Initiated Contact with Patient 06/22/15 1255     Chief Complaint  Patient presents with  . Shortness of Breath     (Consider location/radiation/quality/duration/timing/severity/associated sxs/prior Treatment) HPI patient presents with concern of ongoing mild dyspnea, right shoulder pain, right lower costal margin pain, fatigue. Symptoms have been present for almost 2 weeks, following addition to a different facility for sepsis/pneumonia. She notes that about one week ago, significant discharge she is feeling generally better, stronger.  Portal, she notes that her energy has not continued to increase, and she has persistent anorexia, mild nausea. No increased cough, no increased dyspnea, no current vomiting, diarrhea, fever. Patient has been taking all medication as directed, including amoxicillin/clavulanic acid. Patient was on Lasix following a hospitalization due to substantial diffuse edema. Following initiation of Lasix, patient had onset of rash, and visual change. Stop Lasix, and the visual changes resolved, but the patient has persistent mild bilateral lower extremity ankle rash, unchanged, not painful, minimally raised, erythematous. Loss of sensation in any extremity.   Past Medical History  Diagnosis Date  . Allergic rhinitis   . Diabetes mellitus   . Hyperlipidemia   . Hypertension   . Arthritis   . Menopause   . Cough   . Hemorrhoids   . Tubular adenoma of colon 12/2006  . IBS (irritable bowel syndrome)   . Cellulitis, periorbital 2006    left eye, due to MRSA, saw Dr. Erik Obey   . 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 Pneumonia    Past Surgical History  Procedure Laterality Date  . Polypectomy    . Colonoscopy  04-01-12    per Dr. Fuller Plan, tubular adenoma, repeat in 5 yrs   . Tubal ligation    . Appendectomy     Family History  Problem Relation Age of  Onset  . Alcohol abuse Mother   . Diabetes Mother   . Heart disease Father   . Heart disease Brother    History  Substance Use Topics  . Smoking status: Current Every Day Smoker -- 0.50 packs/day for 1 years    Types: Cigarettes  . Smokeless tobacco: Never Used     Comment: 7 cigarettes per day  . Alcohol Use: 0.0 oz/week    0 Standard drinks or equivalent per week     Comment: glass of wine daily   OB History    No data available     Review of Systems  Constitutional:       Per HPI, otherwise negative  HENT:       Per HPI, otherwise negative  Respiratory:       Per HPI, otherwise negative  Cardiovascular:       Per HPI, otherwise negative  Gastrointestinal: Positive for nausea. Negative for vomiting.  Endocrine:       Negative aside from HPI  Genitourinary:       Neg aside from HPI   Musculoskeletal:       Per HPI, otherwise negative  Skin: Positive for rash.  Neurological: Positive for weakness. Negative for syncope.      Allergies  Benazepril and Phenobarbital  Home Medications   Prior to Admission medications   Medication Sig Start Date End Date Taking? Authorizing Provider  allopurinol (ZYLOPRIM) 300 MG tablet Take 1 tablet (300 mg total) by mouth daily. 07/27/14   Laurey Morale, MD  Ascorbic Acid (VITAMIN C) 1000 MG tablet Take 1,000 mg by mouth daily.    Historical Provider, MD  aspirin EC 81 MG tablet Take 81 mg by mouth daily.    Historical Provider, MD  atorvastatin (LIPITOR) 40 MG tablet Take 1 tablet (40 mg total) by mouth daily. 07/27/14 07/27/15  Laurey Morale, MD  cholecalciferol (VITAMIN D) 1000 UNITS tablet Take 1,000 Units by mouth daily.    Historical Provider, MD  diazepam (VALIUM) 5 MG tablet Take 1 tablet (5 mg total) by mouth every 8 (eight) hours as needed for anxiety. For anxiety 07/27/14   Laurey Morale, MD  diclofenac (VOLTAREN) 75 MG EC tablet Take 1 tablet (75 mg total) by mouth 2 (two) times daily as needed for moderate pain. 07/27/14    Laurey Morale, MD  diltiazem (DILACOR XR) 180 MG 24 hr capsule Take 1 capsule (180 mg total) by mouth daily. 05/22/15   Laurey Morale, MD  fluconazole (DIFLUCAN) 150 MG tablet Take 1 tablet (150 mg total) by mouth daily. Patient not taking: Reported on 04/28/2015 10/12/14   Laurey Morale, MD  fluticasone [facility]) 50 MCG/ACT nasal spray Place 2 sprays into the nose 2 (two) times daily as needed. For allergies 07/21/13 07/21/14  Laurey Morale, MD  glipiZIDE (GLUCOTROL XL) 5 MG 24 hr tablet Take 1 tablet (5 mg total) by mouth daily. 07/27/14 07/27/15  Laurey Morale, MD  hydrochlorothiazide (HYDRODIURIL) 25 MG tablet Take 1 tablet (25 mg total) by mouth daily. 04/28/15   Laurey Morale, MD  HYDROcodone-acetaminophen (NORCO) 10-325 MG per tablet Take 1 tablet by mouth every 8 (eight) hours as needed for moderate pain. Patient not taking: Reported on 04/28/2015 07/27/14   Laurey Morale, MD  Phenyleph-Promethazine-Cod (PROMETHAZINE VC/CODEINE) 5-6.25-10 MG/5ML SYRP TAKE 5 MLS BY MOUTH TWICE DAILY AS NEEDED FOR IRRITABLE BOWEL SYNDROME 05/22/15   Laurey Morale, MD  polyethylene glycol powder (GLYCOLAX/MIRALAX) powder Take 17 g by mouth daily.    Historical Provider, MD  saccharomyces boulardii (FLORASTOR) 250 MG capsule Take 250 mg by mouth daily.    Historical Provider, MD  terconazole (TERAZOL 7) 0.4 % vaginal cream Place 1 applicator vaginally at bedtime. Patient not taking: Reported on 04/28/2015 10/12/14   Laurey Morale, MD   BP 119/56 mmHg  Pulse 70  Temp(Src) 97.5 F (36.4 C) (Oral)  Resp 18  SpO2 96% Physical Exam  Constitutional: She is oriented to person, place, and time. She appears well-developed and well-nourished. No distress.  HENT:  Head: Normocephalic and atraumatic.  Eyes: Conjunctivae and EOM are normal.  Cardiovascular: Normal rate and regular rhythm.   Pulmonary/Chest: Effort normal and breath sounds normal. No stridor. No respiratory distress.  Abdominal: She exhibits no distension.   Musculoskeletal: She exhibits no edema.  Neurological: She is alert and oriented to person, place, and time. No cranial nerve deficit.  Skin: Skin is warm and dry. Rash noted.  About the left ankle there is a minimally raised, erythematous papular rash, minimal confluent erythema, no progression superiorly  Psychiatric: She has a normal mood and affect.  Nursing note and vitals reviewed.   ED Course  Procedures (including critical care time) Labs Review Labs Reviewed  BASIC METABOLIC PANEL - Abnormal; Notable for the following:    Sodium 132 (*)    Chloride 100 (*)    Glucose, Bld 179 (*)    All other components within normal limits  CBC - Abnormal; Notable for the  following:    WBC 12.8 (*)    Hemoglobin 11.8 (*)    HCT 34.9 (*)    Platelets 410 (*)    All other components within normal limits  BRAIN NATRIURETIC PEPTIDE  I-STAT TROPOININ, ED    Imaging Review Dg Chest 2 View  06/22/2015   CLINICAL DATA:  Short of breath  EXAM: CHEST  2 VIEW  COMPARISON:  CT chest 03/17/2012  FINDINGS: Right lower lobe infiltrate and small effusion. Probable pneumonia. Left lung is clear. No heart failure.  IMPRESSION: Right lower lobe infiltrate and effusion, probable pneumonia.   Electronically Signed   By: Franchot Gallo M.D.   On: 06/22/2015 12:12    I reviewed the x-ray, demonstrated it to the patient and her husband, including talking about the right-sided pleural effusion, the persistent pneumonia. We discussed the patient's reassuring vital signs, labs, long course of recovery following a pneumonia. I also discussed return precautions, follow-up instructions.   MDM  Patient presents with persistent dyspnea, cough, right-sided chest wall pain. Patient's evaluation suggests recovered from pneumonia, and here the patient is afebrile, with no hypotension, tachycardia, hypoxia suggesting bacteremia or sepsis. Patient is currently taking antibiotics, and was advised to keep doing  so. Patient's chest wall pain, shoulder pain likely due to pleural effusion, persistent radiographic evidence of pneumonia. Rashes not concerned for Stevens-Johnson syndrome or toxic epidermal necrosis. After lengthy conversation with the patient, she started on new medication for inflammation, discharged in stable condition.  Carmin Muskrat, MD 06/22/15 [phone]

## 2015-06-22 NOTE — ED Notes (Signed)
Pt was dx with double PNA back in June and admitted to [facility]. Was released this past Wednesday and still not feeling better. WBC when released was 15. Placed on augmentin when released and still taking but is still having pain on the right side when she breathes and feels really fatigued. Pt was septic when she got admitted over there. Feels like she doesn't have any energy and not sure if she is fighting something else.

## 2015-06-22 NOTE — Discharge Instructions (Signed)
As discussed, your evaluation today has been largely reassuring.  But, it is important that you monitor your condition carefully, and do not hesitate to return to the ED if you develop new, or concerning changes in your condition.  As demonstrated, your chest wall pain, shoulder pain is likely due to the persistent effects of pneumonia, as well as the pleural effusion  It is very important that she will take all medication as directed, and follow up with her primary care physician.

## 2015-06-26 ENCOUNTER — Encounter: Payer: Self-pay | Admitting: Family Medicine

## 2015-06-26 ENCOUNTER — Ambulatory Visit (INDEPENDENT_AMBULATORY_CARE_PROVIDER_SITE_OTHER): Payer: Medicare Other | Admitting: Family Medicine

## 2015-06-26 VITALS — BP 127/66 | HR 81 | Temp 97.7°F | Ht 62.5 in | Wt 151.0 lb

## 2015-06-26 DIAGNOSIS — A419 Sepsis, unspecified organism: Secondary | ICD-10-CM | POA: Diagnosis not present

## 2015-06-26 DIAGNOSIS — J189 Pneumonia, unspecified organism: Secondary | ICD-10-CM | POA: Diagnosis not present

## 2015-06-26 DIAGNOSIS — J181 Lobar pneumonia, unspecified organism: Principal | ICD-10-CM

## 2015-06-26 MED ORDER — PHENYLEPH-PROMETHAZINE-COD 5-6.25-10 MG/5ML PO SYRP: ORAL_SOLUTION | ORAL | Status: DC

## 2015-06-26 MED ORDER — HYDROCODONE-ACETAMINOPHEN 10-325 MG PO TABS: 1.0000 | ORAL_TABLET | Freq: Three times a day (TID) | ORAL | Status: DC | PRN

## 2015-06-26 NOTE — Progress Notes (Signed)
   Subjective:    Patient ID: Deborah Bryant, female    DOB: 02/27/1946, 69 y.o.   MRN: [id number]  HPI Here to follow up pneumonia and sepsis. She was in [facility] from 06-10-15 to 06-14-15 for fever, hypoxia and altered consciousness which was found to be due to a RLL pneumonia. She was septic and required BiPap for ventiation. She received IV fluids and antibiotics. Her admission WBC count was 24 K but this went down to 12 as of a week ago. She was sent home on a Zpack but continued to feel weak and SOB, so she went to [facility] ER on 06-18-15. Her CXR and labs showed improvement but she was placed on Augmentin. She finished this last night. She feels better at this toint but remains quite weak. Her appetite is slowly returning but is still compromised. She had diarrhea at first but not now. No fevers.    Review of Systems  Constitutional: Positive for fatigue.  HENT: Negative.   Eyes: Negative.   Respiratory: Positive for shortness of breath. Negative for wheezing.   Cardiovascular: Negative.        Objective:   Physical Exam  Constitutional: She is oriented to person, place, and time. She appears well-developed and well-nourished. No distress.  Neck: No thyromegaly present.  Cardiovascular: Normal rate, regular rhythm, normal heart sounds and intact distal pulses.   Pulmonary/Chest: Effort normal and breath sounds normal. No respiratory distress. She has no wheezes. She has no rales. She exhibits no tenderness.  Abdominal: Soft. Bowel sounds are normal. She exhibits no distension and no mass. There is no tenderness. There is no rebound and no guarding.  Lymphadenopathy:    She has no cervical adenopathy.  Neurological: She is alert and oriented to person, place, and time.          Assessment & Plan:  She is slowly recovering from a pneumonia with sepsis. She will maximize her nutrition and I suggested she take 3 bottles of Ensure or Boost a day. Recheck in one week.

## 2015-06-26 NOTE — Progress Notes (Signed)
Pre visit review using our clinic review tool, if applicable. No additional management support is needed unless otherwise documented below in the visit note. 

## 2015-06-28 ENCOUNTER — Telehealth: Payer: Self-pay | Admitting: Family Medicine

## 2015-06-28 ENCOUNTER — Encounter: Payer: Self-pay | Admitting: Family Medicine

## 2015-06-28 ENCOUNTER — Ambulatory Visit (INDEPENDENT_AMBULATORY_CARE_PROVIDER_SITE_OTHER): Payer: Medicare Other | Admitting: Family Medicine

## 2015-06-28 VITALS — BP 124/67 | HR 82 | Temp 98.2°F | Ht 62.5 in | Wt 150.0 lb

## 2015-06-28 DIAGNOSIS — J189 Pneumonia, unspecified organism: Secondary | ICD-10-CM

## 2015-06-28 DIAGNOSIS — J181 Lobar pneumonia, unspecified organism: Secondary | ICD-10-CM

## 2015-06-28 DIAGNOSIS — R0789 Other chest pain: Secondary | ICD-10-CM

## 2015-06-28 MED ORDER — PREDNISONE 20 MG PO TABS: 20.0000 mg | ORAL_TABLET | Freq: Every day | ORAL | Status: DC

## 2015-06-28 NOTE — Telephone Encounter (Signed)
Pt has made an appt today (06/28/15) to see Dr. Sarajane Jews at 11:30 AM.

## 2015-06-28 NOTE — Telephone Encounter (Signed)
[facility] [place] [facility]     Patient Name: Royston Bake Client [facility] Client    Client Site [place] - Day    Physician Fry, Laf[place] Type Call    Call Type Triage / Clinical    Relationship To Patient Self  Gender: Female Appointment Disposition EMR Appointment Scheduled  DOB: 03/20/1946  Info pasted into Epic Yes  Age: 69 Y 24 M 6 D Return Phone Number [phone] (Primary)  Return Phone Number: [phone] (Primary) Chief Complaint Chest Pain (non urgent symptoms)  Address:  Initial Comment Caller states she was discharged 2 wks ago after double pneumonia and sepsis. Having right lung pain since. Saw MD Monday, worse.  City/State/Zip: [place]  PreDisposition Call Doctor    Nurse Assessment  Nurse: Buck Mam, RN, Trish Date/Time Eilene Ghazi Time): 06/28/2015 8:07:51 AM  Confirm and document reason for call. If symptomatic, describe symptoms. ---Patient is calling for self and caller states she was discharged 2 wks ago after double pneumonia and sepsis. Having right lung pain since. Saw MD Monday, worse. Was hospitalized. MD put on prednisone 1 week ago Thursday till Monday. Worse sx is pain in lower right lobe and transfers up to shoulder started yesterday and worse when laying down. Seen on Monday and no new medication and was told to increase calorie count. No new temp. No cough or phlegm.  Has the patient traveled out of the country within the last 30 days? ---No  Does the patient require triage? ---Yes  Related visit to physician within the last 2 weeks? ---Yes  Does the PT have any chronic conditions? (i.e. diabetes, asthma, etc.) ---Yes  List chronic conditions. ---diabetic COPD hypertension    Guidelines      Guideline Title Affirmed Question Affirmed Notes Nurse Date/Time (Eastern Time)  COPD Post-Hospitalization Follow-up Call [1] MILD  difficulty breathing (e.g., minimal/no SOB at rest, SOB with walking) AND [2] worse than when discharged from hospital  [place], RN, [place] 06/28/2015 8:14:42 AM  Disp. Time Eilene Ghazi Time) Disposition Final User         06/28/2015 8:16:49 AM See Physician within 24 Hours Yes Buck Mam, RN, Industrial/product designer Understands: Yes   Disagree/Comply: Comply      Care Advice Given Per Guideline         SEE PHYSICIAN WITHIN 24 HOURS: ALTERNATE DISPOSITION - CALL SPECIALIST WITHIN 24 HOURS: Call the specialist who takes care of you for this condition. COPD - TREATMENT - YOUR DOCTOR'S INSTRUCTIONS: * You should closely follow all instructions (verbal and written) that your doctor gave you. CALL BACK IF: * Fever higher than 100.5 F (38.1 C) * You become worse. CARE ADVICE given per COPD Post-Hospitalization Follow-Up Call (Adult) guideline.   After Care Instructions Given     Call Event Type User Date / Time Description               Referrals   REFERRED TO PCP OFFICE

## 2015-06-28 NOTE — Progress Notes (Signed)
Pre visit review using our clinic review tool, if applicable. No additional management support is needed unless otherwise documented below in the visit note. 

## 2015-06-28 NOTE — Progress Notes (Signed)
   Subjective:    Patient ID: OWEN PAGNOTTA, female    DOB: [birth year], 69 y.o.   MRN: [id number]  HPI Here for worsening pain in the right side and right middle back over the past 24 hours. She has been recovering from a RLL pneumonia and we saw her just 2 days ago for this. She has finished all her antibiotics. She has been taking Boost drinks to get her strength back up and this has been helping. No cough or SOB or fever lately.   Review of Systems  Constitutional: Positive for fatigue. Negative for fever, chills and diaphoresis.  HENT: Negative.   Respiratory: Negative.   Cardiovascular: Positive for chest pain. Negative for palpitations and leg swelling.       Objective:   Physical Exam  Constitutional: She appears well-developed and well-nourished. No distress.  Neck: No thyromegaly present.  Cardiovascular: Normal rate, regular rhythm, normal heart sounds and intact distal pulses.   Pulmonary/Chest: Effort normal and breath sounds normal. No respiratory distress. She has no wheezes. She has no rales. She exhibits no tenderness.  Abdominal: Soft. Bowel sounds are normal. She exhibits no distension. There is no tenderness. There is no rebound.  Lymphadenopathy:    She has no cervical adenopathy.          Assessment & Plan:  This is probably some pleural irritation from the pneumonia but we will set up a chest CT to get a better look at this process. Given 5 days of prednisone 20 mg daily.

## 2015-06-30 ENCOUNTER — Other Ambulatory Visit: Payer: Self-pay | Admitting: Family Medicine

## 2015-06-30 ENCOUNTER — Telehealth: Payer: Self-pay | Admitting: Family Medicine

## 2015-06-30 ENCOUNTER — Ambulatory Visit (INDEPENDENT_AMBULATORY_CARE_PROVIDER_SITE_OTHER)
Admission: RE | Admit: 2015-06-30 | Discharge: 2015-06-30 | Disposition: A | Discharge status: Home / Self Care | Payer: Medicare Other | Source: Ambulatory Visit | Attending: Family Medicine | Admitting: Family Medicine

## 2015-06-30 DIAGNOSIS — R0789 Other chest pain: Secondary | ICD-10-CM

## 2015-06-30 DIAGNOSIS — J189 Pneumonia, unspecified organism: Secondary | ICD-10-CM

## 2015-06-30 MED ORDER — CEFUROXIME AXETIL 500 MG PO TABS: 500.0000 mg | ORAL_TABLET | Freq: Two times a day (BID) | ORAL | Status: DC

## 2015-06-30 NOTE — Telephone Encounter (Signed)
Per Dr. Sarajane Jews order a CBC with diff and pt can get labs done on Monday 07/03/15. I spoke with pt and she will schedule this appointment.

## 2015-06-30 NOTE — Telephone Encounter (Signed)
I left a voice message for pt to schedule lab appointment.

## 2015-07-03 ENCOUNTER — Other Ambulatory Visit (INDEPENDENT_AMBULATORY_CARE_PROVIDER_SITE_OTHER): Payer: Medicare Other

## 2015-07-03 ENCOUNTER — Ambulatory Visit: Payer: Medicare Other | Admitting: Family Medicine

## 2015-07-03 DIAGNOSIS — J189 Pneumonia, unspecified organism: Secondary | ICD-10-CM

## 2015-07-03 LAB — CBC WITH DIFFERENTIAL/PLATELET
BASOS PCT: 0.3 % (ref 0.0–3.0)
Basophils Absolute: 0.1 10*3/uL (ref 0.0–0.1)
EOS ABS: 0 10*3/uL (ref 0.0–0.7)
Eosinophils Relative: 0 % (ref 0.0–5.0)
HCT: 40.9 % (ref 36.0–46.0)
Hemoglobin: 13.4 g/dL (ref 12.0–15.0)
LYMPHS PCT: 5.7 % — AB (ref 12.0–46.0)
Lymphs Abs: 1 10*3/uL (ref 0.7–4.0)
MCHC: 32.9 g/dL (ref 30.0–36.0)
MCV: 89.9 fl (ref 78.0–100.0)
MONO ABS: 0.2 10*3/uL (ref 0.1–1.0)
Monocytes Relative: 1.3 % — ABNORMAL LOW (ref 3.0–12.0)
NEUTROS ABS: 16 10*3/uL — AB (ref 1.4–7.7)
Platelets: 448 10*3/uL — ABNORMAL HIGH (ref 150.0–400.0)
RBC: 4.55 Mil/uL (ref 3.87–5.11)
RDW: 15 % (ref 11.5–15.5)
WBC: 17.3 10*3/uL — AB (ref 4.0–10.5)

## 2015-07-05 ENCOUNTER — Encounter: Payer: Self-pay | Admitting: Family Medicine

## 2015-07-06 NOTE — Telephone Encounter (Signed)
I spoke with pt and went over results. 

## 2015-07-06 NOTE — Telephone Encounter (Signed)
See my result note.

## 2015-07-10 ENCOUNTER — Ambulatory Visit (INDEPENDENT_AMBULATORY_CARE_PROVIDER_SITE_OTHER): Payer: Medicare Other | Admitting: Family Medicine

## 2015-07-10 ENCOUNTER — Encounter: Payer: Self-pay | Admitting: Family Medicine

## 2015-07-10 VITALS — BP 130/68 | HR 75 | Temp 98.1°F | Ht 62.5 in | Wt 156.0 lb

## 2015-07-10 DIAGNOSIS — J189 Pneumonia, unspecified organism: Secondary | ICD-10-CM

## 2015-07-10 DIAGNOSIS — J181 Lobar pneumonia, unspecified organism: Principal | ICD-10-CM

## 2015-07-10 LAB — CBC WITH DIFFERENTIAL/PLATELET
BASOS PCT: 0.4 % (ref 0.0–3.0)
Basophils Absolute: 0 10*3/uL (ref 0.0–0.1)
EOS PCT: 0.8 % (ref 0.0–5.0)
Eosinophils Absolute: 0.1 10*3/uL (ref 0.0–0.7)
HCT: 39.5 % (ref 36.0–46.0)
HEMOGLOBIN: 12.9 g/dL (ref 12.0–15.0)
Lymphocytes Relative: 14.3 % (ref 12.0–46.0)
Lymphs Abs: 1.6 10*3/uL (ref 0.7–4.0)
MCHC: 32.7 g/dL (ref 30.0–36.0)
MCV: 90.1 fl (ref 78.0–100.0)
Monocytes Absolute: 0.7 10*3/uL (ref 0.1–1.0)
Monocytes Relative: 5.8 % (ref 3.0–12.0)
NEUTROS ABS: 8.9 10*3/uL — AB (ref 1.4–7.7)
NEUTROS PCT: 78.7 % — AB (ref 43.0–77.0)
Platelets: 303 10*3/uL (ref 150.0–400.0)
RBC: 4.39 Mil/uL (ref 3.87–5.11)
RDW: 14.9 % (ref 11.5–15.5)
WBC: 11.3 10*3/uL — ABNORMAL HIGH (ref 4.0–10.5)

## 2015-07-10 LAB — BASIC METABOLIC PANEL
BUN: 13 mg/dL (ref 6–23)
CO2: 27 mEq/L (ref 19–32)
Calcium: 9.7 mg/dL (ref 8.4–10.5)
Chloride: 102 mEq/L (ref 96–112)
Creatinine, Ser: 0.87 mg/dL (ref 0.40–1.20)
GFR: 68.64 mL/min (ref >60.00)
Glucose, Bld: 135 mg/dL — ABNORMAL HIGH (ref 70–99)
Potassium: 3.8 mEq/L (ref 3.5–5.1)
Sodium: 137 mEq/L (ref 135–145)

## 2015-07-10 NOTE — Progress Notes (Signed)
   Subjective:    Patient ID: Deborah Bryant, female    DOB: [birth year], 69 y.o.   MRN: [id number]  HPI Here to follow up a RLL pneumonia. She had a chest CT recently which showed a residual pneumonia, even though she felt better clinically. We gave her a 10 day course of Ceftin which she finished this am. She does feel better, the cough is gone, her strength is returning, and the chest pains have greatly decreased.    Review of Systems  Constitutional: Negative.   HENT: Negative.   Eyes: Negative.   Respiratory: Negative.   Cardiovascular: Positive for chest pain. Negative for palpitations and leg swelling.       Objective:   Physical Exam  Constitutional: She appears well-developed and well-nourished.  HENT:  Right Ear: External ear normal.  Left Ear: External ear normal.  Nose: Nose normal.  Mouth/Throat: Oropharynx is clear and moist.  Eyes: Conjunctivae are normal.  Neck: No thyromegaly present.  Cardiovascular: Normal rate and normal heart sounds.   Pulmonary/Chest: Effort normal and breath sounds normal. No respiratory distress. She has no wheezes. She has no rales. She exhibits no tenderness.  Lymphadenopathy:    She has no cervical adenopathy.          Assessment & Plan:  RLL pneumonia. We will get labs today and set up another CT later this week.

## 2015-07-10 NOTE — Progress Notes (Signed)
Pre visit review using our clinic review tool, if applicable. No additional management support is needed unless otherwise documented below in the visit note. 

## 2015-07-13 ENCOUNTER — Ambulatory Visit (INDEPENDENT_AMBULATORY_CARE_PROVIDER_SITE_OTHER)
Admission: RE | Admit: 2015-07-13 | Discharge: 2015-07-13 | Disposition: A | Discharge status: Home / Self Care | Payer: Medicare Other | Source: Ambulatory Visit | Attending: Family Medicine | Admitting: Family Medicine

## 2015-07-13 DIAGNOSIS — J181 Lobar pneumonia, unspecified organism: Principal | ICD-10-CM

## 2015-07-13 DIAGNOSIS — J189 Pneumonia, unspecified organism: Secondary | ICD-10-CM

## 2015-07-14 ENCOUNTER — Encounter: Payer: Self-pay | Admitting: Family Medicine

## 2015-07-14 NOTE — Telephone Encounter (Signed)
I spoke with pt and went over results. 

## 2015-07-18 LAB — HM MAMMOGRAPHY

## 2015-07-20 ENCOUNTER — Encounter: Payer: Self-pay | Admitting: Family Medicine

## 2015-08-01 ENCOUNTER — Ambulatory Visit (INDEPENDENT_AMBULATORY_CARE_PROVIDER_SITE_OTHER): Payer: Medicare Other | Admitting: Family Medicine

## 2015-08-01 ENCOUNTER — Encounter: Payer: Self-pay | Admitting: Family Medicine

## 2015-08-01 VITALS — BP 133/69 | HR 76 | Temp 97.7°F | Ht 61.75 in | Wt 155.0 lb

## 2015-08-01 DIAGNOSIS — E559 Vitamin D deficiency, unspecified: Secondary | ICD-10-CM | POA: Diagnosis not present

## 2015-08-01 DIAGNOSIS — E119 Type 2 diabetes mellitus without complications: Secondary | ICD-10-CM | POA: Diagnosis not present

## 2015-08-01 DIAGNOSIS — M1A09X0 Idiopathic chronic gout, multiple sites, without tophus (tophi): Secondary | ICD-10-CM | POA: Diagnosis not present

## 2015-08-01 DIAGNOSIS — Z0000 Encounter for general adult medical examination without abnormal findings: Secondary | ICD-10-CM

## 2015-08-01 LAB — CBC WITH DIFFERENTIAL/PLATELET
BASOS ABS: 0.1 10*3/uL (ref 0.0–0.1)
Basophils Relative: 1 % (ref 0.0–3.0)
EOS ABS: 0.1 10*3/uL (ref 0.0–0.7)
Eosinophils Relative: 0.8 % (ref 0.0–5.0)
HEMATOCRIT: 42 % (ref 36.0–46.0)
Hemoglobin: 13.9 g/dL (ref 12.0–15.0)
LYMPHS ABS: 1.4 10*3/uL (ref 0.7–4.0)
LYMPHS PCT: 16.8 % (ref 12.0–46.0)
MCHC: 33.2 g/dL (ref 30.0–36.0)
MCV: 90.3 fl (ref 78.0–100.0)
Monocytes Absolute: 0.7 10*3/uL (ref 0.1–1.0)
Monocytes Relative: 7.8 % (ref 3.0–12.0)
NEUTROS ABS: 6.3 10*3/uL (ref 1.4–7.7)
NEUTROS PCT: 73.6 % (ref 43.0–77.0)
Platelets: 351 10*3/uL (ref 150.0–400.0)
RBC: 4.65 Mil/uL (ref 3.87–5.11)
RDW: 14.7 % (ref 11.5–15.5)
WBC: 8.6 10*3/uL (ref 4.0–10.5)

## 2015-08-01 LAB — BASIC METABOLIC PANEL
BUN: 17 mg/dL (ref 6–23)
CALCIUM: 9.9 mg/dL (ref 8.4–10.5)
CO2: 29 mEq/L (ref 19–32)
Chloride: 104 mEq/L (ref 96–112)
Creatinine, Ser: 0.93 mg/dL (ref 0.40–1.20)
GFR: 63.54 mL/min (ref >60.00)
GLUCOSE: 115 mg/dL — AB (ref 70–99)
POTASSIUM: 4.6 meq/L (ref 3.5–5.1)
Sodium: 140 mEq/L (ref 135–145)

## 2015-08-01 LAB — POCT URINALYSIS DIPSTICK
Bilirubin, UA: NEGATIVE
Glucose, UA: NEGATIVE
KETONES UA: NEGATIVE
Nitrite, UA: NEGATIVE
PH UA: 7
PROTEIN UA: NEGATIVE
Spec Grav, UA: 1.02
Urobilinogen, UA: 0.2

## 2015-08-01 LAB — LIPID PANEL
Cholesterol: 129 mg/dL (ref 0–200)
HDL: 60.1 mg/dL (ref >39.00)
LDL CALC: 29 mg/dL (ref 0–99)
NonHDL: 68.91
Total CHOL/HDL Ratio: 2
Triglycerides: 200 mg/dL — ABNORMAL HIGH (ref 0.0–149.0)
VLDL: 40 mg/dL (ref 0.0–40.0)

## 2015-08-01 LAB — HEPATIC FUNCTION PANEL
ALBUMIN: 4.2 g/dL (ref 3.5–5.2)
ALT: 15 U/L (ref 0–35)
AST: 18 U/L (ref 0–37)
Alkaline Phosphatase: 113 U/L (ref 39–117)
Bilirubin, Direct: 0.1 mg/dL (ref 0.0–0.3)
Total Bilirubin: 0.5 mg/dL (ref 0.2–1.2)
Total Protein: 6.8 g/dL (ref 6.0–8.3)

## 2015-08-01 LAB — MICROALBUMIN / CREATININE URINE RATIO
Creatinine,U: 276.1 mg/dL
MICROALB/CREAT RATIO: 0.9 mg/g (ref 0.0–30.0)
Microalb, Ur: 2.5 mg/dL — ABNORMAL HIGH (ref 0.0–1.9)

## 2015-08-01 LAB — TSH: TSH: 3.32 u[IU]/mL (ref 0.35–4.50)

## 2015-08-01 LAB — VITAMIN D 25 HYDROXY (VIT D DEFICIENCY, FRACTURES): VITD: 31.21 ng/mL (ref 30.00–100.00)

## 2015-08-01 LAB — URIC ACID: URIC ACID, SERUM: 4.4 mg/dL (ref 2.4–7.0)

## 2015-08-01 MED ORDER — GLIPIZIDE ER 5 MG PO TB24: 5.0000 mg | ORAL_TABLET | Freq: Every day | ORAL | Status: DC

## 2015-08-01 MED ORDER — FLUTICASONE PROPIONATE 50 MCG/ACT NA SUSP: 2.0000 | Freq: Two times a day (BID) | NASAL | Status: DC | PRN

## 2015-08-01 MED ORDER — PHENYLEPH-PROMETHAZINE-COD 5-6.25-10 MG/5ML PO SYRP: ORAL_SOLUTION | ORAL | Status: DC

## 2015-08-01 MED ORDER — DICLOFENAC SODIUM 75 MG PO TBEC: 75.0000 mg | DELAYED_RELEASE_TABLET | Freq: Two times a day (BID) | ORAL | Status: DC | PRN

## 2015-08-01 MED ORDER — ALLOPURINOL 300 MG PO TABS: 300.0000 mg | ORAL_TABLET | Freq: Every day | ORAL | Status: DC

## 2015-08-01 MED ORDER — HYDROCHLOROTHIAZIDE 25 MG PO TABS: 25.0000 mg | ORAL_TABLET | Freq: Every day | ORAL | Status: DC

## 2015-08-01 MED ORDER — DIAZEPAM 5 MG PO TABS: 5.0000 mg | ORAL_TABLET | Freq: Three times a day (TID) | ORAL | Status: DC | PRN

## 2015-08-01 MED ORDER — ATORVASTATIN CALCIUM 40 MG PO TABS: 40.0000 mg | ORAL_TABLET | Freq: Every day | ORAL | Status: DC

## 2015-08-01 MED ORDER — DILTIAZEM HCL ER 180 MG PO CP24: 180.0000 mg | ORAL_CAPSULE | Freq: Every day | ORAL | Status: DC

## 2015-08-01 NOTE — Progress Notes (Signed)
Pre visit review using our clinic review tool, if applicable. No additional management support is needed unless otherwise documented below in the visit note. 

## 2015-08-01 NOTE — Progress Notes (Signed)
   Subjective:    Patient ID: Deborah Bryant, female    DOB: Feb 06, 1946, 69 y.o.   MRN: [id number]  HPI 69 yr old female for a cpx. She feels well. Se seems to have totally recovered from her pneumonia. She is exercising again and working with a Physiological scientist. She had a mammogram on 07-18-15 which was normal., and she had a bone density test that day as well. I have not received this report yet however.    Review of Systems  Constitutional: Negative.   HENT: Negative.   Eyes: Negative.   Respiratory: Negative.   Cardiovascular: Negative.   Gastrointestinal: Negative.   Genitourinary: Negative for dysuria, urgency, frequency, hematuria, flank pain, decreased urine volume, enuresis, difficulty urinating, pelvic pain and dyspareunia.  Musculoskeletal: Negative.   Skin: Negative.   Neurological: Negative.   Psychiatric/Behavioral: Negative.        Objective:   Physical Exam  Constitutional: She is oriented to person, place, and time. She appears well-developed and well-nourished. No distress.  HENT:  Head: Normocephalic and atraumatic.  Right Ear: External ear normal.  Left Ear: External ear normal.  Nose: Nose normal.  Mouth/Throat: Oropharynx is clear and moist. No oropharyngeal exudate.  Eyes: Conjunctivae and EOM are normal. Pupils are equal, round, and reactive to light. No scleral icterus.  Neck: Normal range of motion. Neck supple. No JVD present. No thyromegaly present.  Cardiovascular: Normal rate, regular rhythm, normal heart sounds and intact distal pulses.  Exam reveals no gallop and no friction rub.   No murmur heard. Pulmonary/Chest: Effort normal and breath sounds normal. No respiratory distress. She has no wheezes. She has no rales. She exhibits no tenderness.  Abdominal: Soft. Bowel sounds are normal. She exhibits no distension and no mass. There is no tenderness. There is no rebound and no guarding.  Musculoskeletal: Normal range of motion. She exhibits no edema or  tenderness.  Lymphadenopathy:    She has no cervical adenopathy.  Neurological: She is alert and oriented to person, place, and time. She has normal reflexes. No cranial nerve deficit. She exhibits normal muscle tone. Coordination normal.  Skin: Skin is warm and dry. No rash noted. No erythema.  Psychiatric: She has a normal mood and affect. Her behavior is normal. Judgment and thought content normal.          Assessment & Plan:  Well exam. Get fasting labs. We will try to have the DEXA report sent to [place] to review. Her HTN and diabetes a re well controlled. We reviewed diet and exercise advice.

## 2015-08-07 ENCOUNTER — Encounter: Payer: Self-pay | Admitting: Family Medicine

## 2015-08-07 MED ORDER — LOSARTAN POTASSIUM 50 MG PO TABS: 50.0000 mg | ORAL_TABLET | Freq: Every day | ORAL | Status: DC

## 2015-08-07 NOTE — Addendum Note (Signed)
Addended by: Aggie Hacker A on: 08/07/2015 01:45 PM   Modules accepted: Orders

## 2015-08-09 NOTE — Telephone Encounter (Signed)
Since things are going well I would like to keep the meds as they are.

## 2015-08-10 ENCOUNTER — Encounter: Payer: Self-pay | Admitting: Family Medicine

## 2015-08-10 NOTE — Telephone Encounter (Signed)
Since she is doing well on just Diltiazem and HCTZ, we will keep things as they are. She does not tolerate either ACE inibitors or ARBs.

## 2015-08-11 ENCOUNTER — Encounter: Payer: Self-pay | Admitting: Family Medicine

## 2015-08-11 NOTE — Telephone Encounter (Signed)
Tell her not to worry about the protein in the urine. She cannot take any of the meds we use for this so no big deal. Also tell her to call [facility] herself to find out

## 2015-08-28 ENCOUNTER — Telehealth: Payer: Self-pay | Admitting: Family Medicine

## 2015-08-28 MED ORDER — PHENYLEPH-PROMETHAZINE-COD 5-6.25-10 MG/5ML PO SYRP: ORAL_SOLUTION | ORAL | Status: DC

## 2015-08-28 NOTE — Telephone Encounter (Signed)
Pt needs new rx promethazine/w/codeine

## 2015-08-28 NOTE — Telephone Encounter (Signed)
Script is ready for pick up and I left a voice message for pt. 

## 2015-08-28 NOTE — Telephone Encounter (Signed)
done

## 2015-08-29 ENCOUNTER — Ambulatory Visit (INDEPENDENT_AMBULATORY_CARE_PROVIDER_SITE_OTHER): Payer: Medicare Other

## 2015-08-29 DIAGNOSIS — Z23 Encounter for immunization: Secondary | ICD-10-CM | POA: Diagnosis not present

## 2015-09-15 ENCOUNTER — Encounter: Payer: Self-pay | Admitting: Family Medicine

## 2015-09-15 ENCOUNTER — Ambulatory Visit (INDEPENDENT_AMBULATORY_CARE_PROVIDER_SITE_OTHER): Payer: Medicare Other | Admitting: Family Medicine

## 2015-09-15 VITALS — BP 138/66 | HR 81 | Temp 97.8°F | Ht 61.75 in | Wt 156.0 lb

## 2015-09-15 DIAGNOSIS — M129 Arthropathy, unspecified: Secondary | ICD-10-CM | POA: Diagnosis not present

## 2015-09-15 DIAGNOSIS — M10071 Idiopathic gout, right ankle and foot: Secondary | ICD-10-CM

## 2015-09-15 DIAGNOSIS — M109 Gout, unspecified: Secondary | ICD-10-CM

## 2015-09-15 MED ORDER — HYDROCODONE-ACETAMINOPHEN 10-325 MG PO TABS: 1.0000 | ORAL_TABLET | Freq: Three times a day (TID) | ORAL | Status: DC | PRN

## 2015-09-15 MED ORDER — PREDNISONE 10 MG PO TABS: ORAL_TABLET | ORAL | Status: DC

## 2015-09-15 NOTE — Progress Notes (Signed)
   Subjective:    Patient ID: Deborah Bryant, female    DOB: August 24, 1946, 69 y.o.   MRN: [id number]  HPI Here for 3 days of swelling and pain in the right great toe. No recent trauma. She has not had a gout attack for about a year. Also she asks for a referral to a rheumatologist about her joint pains.    Review of Systems  Constitutional: Negative.   Respiratory: Negative.   Cardiovascular: Negative.   Musculoskeletal: Positive for joint swelling and arthralgias.       Objective:   Physical Exam  Constitutional: She appears well-developed and well-nourished.  Limping   Cardiovascular: Normal rate, regular rhythm, normal heart sounds and intact distal pulses.   Pulmonary/Chest: Effort normal and breath sounds normal.  Musculoskeletal:  Right great toe is swollen over the MTP, this is red, warm, and very tender           Assessment & Plan:  Acute gout, treat with a prednisone taper and Vicodin. Refer to Rheumatology for the joint pains.

## 2015-09-15 NOTE — Progress Notes (Signed)
Pre visit review using our clinic review tool, if applicable. No additional management support is needed unless otherwise documented below in the visit note. 

## 2015-09-26 ENCOUNTER — Telehealth: Payer: Self-pay | Admitting: Family Medicine

## 2015-09-26 MED ORDER — PHENYLEPH-PROMETHAZINE-COD 5-6.25-10 MG/5ML PO SYRP: ORAL_SOLUTION | ORAL | Status: DC

## 2015-09-26 NOTE — Telephone Encounter (Signed)
done

## 2015-09-26 NOTE — Telephone Encounter (Signed)
Pt needs a refill on Phenyleph-Promethazine-Cod (PROMETHAZINE VC/CODEINE) 5-6.25-10 MG/5ML SYRP for her IBS.

## 2015-09-26 NOTE — Telephone Encounter (Signed)
Script is ready for pick up and I left a voice message.  

## 2015-10-23 ENCOUNTER — Telehealth: Payer: Self-pay | Admitting: Family Medicine

## 2015-10-23 NOTE — Telephone Encounter (Signed)
Patient would like her Promethazine - Cod (Syrup) refilled.

## 2015-10-24 ENCOUNTER — Other Ambulatory Visit (INDEPENDENT_AMBULATORY_CARE_PROVIDER_SITE_OTHER): Payer: Medicare Other

## 2015-10-24 ENCOUNTER — Ambulatory Visit (INDEPENDENT_AMBULATORY_CARE_PROVIDER_SITE_OTHER): Payer: Medicare Other | Admitting: Family Medicine

## 2015-10-24 DIAGNOSIS — Z23 Encounter for immunization: Secondary | ICD-10-CM

## 2015-10-24 DIAGNOSIS — E119 Type 2 diabetes mellitus without complications: Secondary | ICD-10-CM | POA: Diagnosis not present

## 2015-10-24 LAB — HEMOGLOBIN A1C: Hgb A1c MFr Bld: 6 % (ref 4.6–6.5)

## 2015-10-24 MED ORDER — PHENYLEPH-PROMETHAZINE-COD 5-6.25-10 MG/5ML PO SYRP: ORAL_SOLUTION | ORAL | Status: DC

## 2015-10-24 NOTE — Telephone Encounter (Signed)
Script is ready for pick up and I spoke with pt.  

## 2015-10-24 NOTE — Telephone Encounter (Signed)
done

## 2015-10-30 ENCOUNTER — Telehealth: Payer: Self-pay | Admitting: Family Medicine

## 2015-10-30 MED ORDER — HYDROCODONE-ACETAMINOPHEN 10-325 MG PO TABS: 1.0000 | ORAL_TABLET | Freq: Three times a day (TID) | ORAL | Status: DC | PRN

## 2015-10-30 NOTE — Telephone Encounter (Signed)
done

## 2015-11-21 ENCOUNTER — Telehealth: Payer: Self-pay | Admitting: Family Medicine

## 2015-11-21 MED ORDER — PHENYLEPH-PROMETHAZINE-COD 5-6.25-10 MG/5ML PO SYRP: ORAL_SOLUTION | ORAL | Status: DC

## 2015-11-21 NOTE — Telephone Encounter (Signed)
Script is ready for pick up and I sent pt a my chart message.  

## 2015-11-21 NOTE — Telephone Encounter (Signed)
done

## 2015-11-21 NOTE — Telephone Encounter (Signed)
Pt request refill of the following:  °Phenyleph-Promethazine-Cod (PROMETHAZINE VC/CODEINE) 5-6.25-10 MG/5ML SYRP ° °Phamacy: ° °

## 2015-12-20 ENCOUNTER — Telehealth: Payer: Self-pay | Admitting: Family Medicine

## 2015-12-20 MED ORDER — PHENYLEPH-PROMETHAZINE-COD 5-6.25-10 MG/5ML PO SYRP: ORAL_SOLUTION | ORAL | Status: DC

## 2015-12-20 NOTE — Telephone Encounter (Signed)
Last fill was 11/20/2015 for 247ml.  Looks like she is using this for IBS. Last appt was 09/15/2015 for gout attack No pending appt Okay for refill?

## 2015-12-20 NOTE — Telephone Encounter (Signed)
Pt needs new rx promethazine/w/codeine °

## 2015-12-20 NOTE — Telephone Encounter (Signed)
done

## 2015-12-20 NOTE — Telephone Encounter (Signed)
Patient notified Rx is ready for pick-up. 

## 2016-01-16 ENCOUNTER — Telehealth: Payer: Self-pay | Admitting: Family Medicine

## 2016-01-16 MED ORDER — PHENYLEPH-PROMETHAZINE-COD 5-6.25-10 MG/5ML PO SYRP: ORAL_SOLUTION | ORAL | Status: DC

## 2016-01-16 NOTE — Telephone Encounter (Signed)
done

## 2016-02-12 ENCOUNTER — Telehealth: Payer: Self-pay | Admitting: Family Medicine

## 2016-02-12 MED ORDER — PHENYLEPH-PROMETHAZINE-COD 5-6.25-10 MG/5ML PO SYRP: ORAL_SOLUTION | ORAL | Status: DC

## 2016-02-12 NOTE — Telephone Encounter (Signed)
Pt request refill of the following:  °Phenyleph-Promethazine-Cod (PROMETHAZINE VC/CODEINE) 5-6.25-10 MG/5ML SYRP ° °Phamacy: ° °

## 2016-02-12 NOTE — Telephone Encounter (Signed)
done

## 2016-02-13 NOTE — Telephone Encounter (Signed)
Patient notified that Rx is ready for pick up.

## 2016-03-18 ENCOUNTER — Telehealth: Payer: Self-pay | Admitting: Family Medicine

## 2016-03-18 MED ORDER — PHENYLEPH-PROMETHAZINE-COD 5-6.25-10 MG/5ML PO SYRP: ORAL_SOLUTION | ORAL | Status: DC

## 2016-03-18 NOTE — Telephone Encounter (Signed)
Dr. Fry patient. 

## 2016-03-18 NOTE — Telephone Encounter (Signed)
This looks like your pt Deborah Bryant.

## 2016-03-18 NOTE — Telephone Encounter (Signed)
Script is ready for pick up and I sent pt a my chart message.  

## 2016-03-18 NOTE — Telephone Encounter (Signed)
Refill ok? 

## 2016-03-18 NOTE — Telephone Encounter (Signed)
done

## 2016-03-18 NOTE — Telephone Encounter (Signed)
Pt needs new rx promethazine °

## 2016-03-25 ENCOUNTER — Encounter: Payer: Self-pay | Admitting: Family Medicine

## 2016-03-31 ENCOUNTER — Encounter: Payer: Self-pay | Admitting: Family Medicine

## 2016-04-02 ENCOUNTER — Encounter: Payer: Self-pay | Admitting: Family Medicine

## 2016-04-03 MED ORDER — HYDROCODONE-ACETAMINOPHEN 10-325 MG PO TABS: 1.0000 | ORAL_TABLET | Freq: Three times a day (TID) | ORAL | Status: DC | PRN

## 2016-04-03 NOTE — Telephone Encounter (Signed)
done

## 2016-04-09 ENCOUNTER — Encounter: Payer: Self-pay | Admitting: Family Medicine

## 2016-04-09 ENCOUNTER — Ambulatory Visit (INDEPENDENT_AMBULATORY_CARE_PROVIDER_SITE_OTHER): Payer: Medicare Other | Admitting: Family Medicine

## 2016-04-09 VITALS — BP 137/76 | HR 73 | Temp 97.8°F | Ht 61.75 in | Wt 160.0 lb

## 2016-04-09 DIAGNOSIS — R319 Hematuria, unspecified: Secondary | ICD-10-CM | POA: Diagnosis not present

## 2016-04-09 DIAGNOSIS — N390 Urinary tract infection, site not specified: Secondary | ICD-10-CM

## 2016-04-09 LAB — POC URINALSYSI DIPSTICK (AUTOMATED)
GLUCOSE UA: NEGATIVE
NITRITE UA: NEGATIVE
Spec Grav, UA: 1.02
UROBILINOGEN UA: 1
pH, UA: 7

## 2016-04-09 MED ORDER — CIPROFLOXACIN HCL 500 MG PO TABS: 500.0000 mg | ORAL_TABLET | Freq: Two times a day (BID) | ORAL | Status: DC

## 2016-04-09 NOTE — Progress Notes (Signed)
   Subjective:    Patient ID: Deborah Bryant, female    DOB: 1946/05/04, 70 y.o.   MRN: [id number]  HPI Here for 3 days of urinary urgency and burning, and she has seen blood in the urine. No fever or back pain. Drinking water.    Review of Systems  Constitutional: Negative.   Genitourinary: Positive for dysuria, urgency, frequency and hematuria. Negative for flank pain and pelvic pain.       Objective:   Physical Exam  Constitutional: She appears well-developed and well-nourished. No distress.  Abdominal: Soft. Bowel sounds are normal. She exhibits no distension and no mass. There is no tenderness. There is no rebound and no guarding.          Assessment & Plan:  UTI, treat with Cipro. Culture the sample.  Laurey Morale, MD

## 2016-04-09 NOTE — Progress Notes (Signed)
Pre visit review using our clinic review tool, if applicable. No additional management support is needed unless otherwise documented below in the visit note. 

## 2016-04-12 LAB — URINE CULTURE: Colony Count: >=100000

## 2016-04-19 ENCOUNTER — Telehealth: Payer: Self-pay | Admitting: Family Medicine

## 2016-04-19 MED ORDER — PROMETHAZINE VC/CODEINE 6.25-5-10 MG/5ML PO SYRP: 5.0000 mL | ORAL_SOLUTION | Freq: Two times a day (BID) | ORAL | Status: DC | PRN

## 2016-04-19 NOTE — Telephone Encounter (Signed)
Pt need new Rx for phenyleph-Promethazine-Cod  (PROMETHAZINE VC/CODEINE) syrup

## 2016-04-19 NOTE — Telephone Encounter (Signed)
done

## 2016-04-19 NOTE — Telephone Encounter (Signed)
Script is ready for pick up and I sent pt a my chart message.  

## 2016-05-21 ENCOUNTER — Telehealth: Payer: Self-pay | Admitting: Family Medicine

## 2016-05-21 NOTE — Telephone Encounter (Signed)
° ° ° °

## 2016-05-21 NOTE — Telephone Encounter (Signed)
Last filled 04/19/16

## 2016-05-22 MED ORDER — PROMETHAZINE VC/CODEINE 6.25-5-10 MG/5ML PO SYRP: 5.0000 mL | ORAL_SOLUTION | Freq: Two times a day (BID) | ORAL | Status: DC | PRN

## 2016-05-22 NOTE — Telephone Encounter (Signed)
done

## 2016-06-19 ENCOUNTER — Other Ambulatory Visit: Payer: Self-pay | Admitting: Family Medicine

## 2016-06-19 NOTE — Telephone Encounter (Signed)
Pt need new Rx for promethazine-codeine

## 2016-06-20 MED ORDER — PROMETHAZINE VC/CODEINE 6.25-5-10 MG/5ML PO SYRP: 5.0000 mL | ORAL_SOLUTION | Freq: Two times a day (BID) | ORAL | Status: DC | PRN

## 2016-06-20 NOTE — Telephone Encounter (Signed)
Left pt vm letting her know Rx is ready for pick up.

## 2016-06-20 NOTE — Telephone Encounter (Signed)
done

## 2016-07-22 ENCOUNTER — Telehealth: Payer: Self-pay | Admitting: Family Medicine

## 2016-07-22 MED ORDER — PROMETHAZINE VC/CODEINE 6.25-5-10 MG/5ML PO SYRP: 5.0000 mL | ORAL_SOLUTION | Freq: Two times a day (BID) | ORAL | 0 refills | Status: DC | PRN

## 2016-07-22 NOTE — Telephone Encounter (Signed)
Given to patient  

## 2016-08-02 ENCOUNTER — Encounter: Payer: Self-pay | Admitting: Family Medicine

## 2016-08-02 ENCOUNTER — Ambulatory Visit (INDEPENDENT_AMBULATORY_CARE_PROVIDER_SITE_OTHER): Payer: Medicare Other | Admitting: Family Medicine

## 2016-08-02 VITALS — BP 122/72 | HR 73 | Temp 97.9°F | Ht 62.0 in | Wt 160.0 lb

## 2016-08-02 DIAGNOSIS — E785 Hyperlipidemia, unspecified: Secondary | ICD-10-CM

## 2016-08-02 DIAGNOSIS — E119 Type 2 diabetes mellitus without complications: Secondary | ICD-10-CM

## 2016-08-02 DIAGNOSIS — M10071 Idiopathic gout, right ankle and foot: Secondary | ICD-10-CM | POA: Diagnosis not present

## 2016-08-02 DIAGNOSIS — Z0000 Encounter for general adult medical examination without abnormal findings: Secondary | ICD-10-CM | POA: Diagnosis not present

## 2016-08-02 DIAGNOSIS — M109 Gout, unspecified: Secondary | ICD-10-CM

## 2016-08-02 LAB — CBC WITH DIFFERENTIAL/PLATELET
Basophils Absolute: 0.1 10*3/uL (ref 0.0–0.1)
Basophils Relative: 0.7 % (ref 0.0–3.0)
EOS PCT: 0.9 % (ref 0.0–5.0)
Eosinophils Absolute: 0.1 10*3/uL (ref 0.0–0.7)
HEMATOCRIT: 43.6 % (ref 36.0–46.0)
Hemoglobin: 14.4 g/dL (ref 12.0–15.0)
Lymphocytes Relative: 16.6 % (ref 12.0–46.0)
Lymphs Abs: 1.7 10*3/uL (ref 0.7–4.0)
MCHC: 33 g/dL (ref 30.0–36.0)
MCV: 91.5 fl (ref 78.0–100.0)
MONO ABS: 0.8 10*3/uL (ref 0.1–1.0)
MONOS PCT: 7.5 % (ref 3.0–12.0)
Neutro Abs: 7.4 10*3/uL (ref 1.4–7.7)
Neutrophils Relative %: 74.3 % (ref 43.0–77.0)
PLATELETS: 303 10*3/uL (ref 150.0–400.0)
RBC: 4.77 Mil/uL (ref 3.87–5.11)
RDW: 14.1 % (ref 11.5–15.5)
WBC: 10 10*3/uL (ref 4.0–10.5)

## 2016-08-02 LAB — HEPATIC FUNCTION PANEL
ALT: 12 U/L (ref 0–35)
AST: 16 U/L (ref 0–37)
Albumin: 4.5 g/dL (ref 3.5–5.2)
Alkaline Phosphatase: 92 U/L (ref 39–117)
BILIRUBIN DIRECT: 0.1 mg/dL (ref 0.0–0.3)
BILIRUBIN TOTAL: 0.5 mg/dL (ref 0.2–1.2)
Total Protein: 7.4 g/dL (ref 6.0–8.3)

## 2016-08-02 LAB — BASIC METABOLIC PANEL
BUN: 19 mg/dL (ref 6–23)
CALCIUM: 10.1 mg/dL (ref 8.4–10.5)
CHLORIDE: 101 meq/L (ref 96–112)
CO2: 29 mEq/L (ref 19–32)
CREATININE: 0.96 mg/dL (ref 0.40–1.20)
GFR: 61.08 mL/min (ref >60.00)
Glucose, Bld: 96 mg/dL (ref 70–99)
Potassium: 3.7 mEq/L (ref 3.5–5.1)
Sodium: 139 mEq/L (ref 135–145)

## 2016-08-02 LAB — POC URINALSYSI DIPSTICK (AUTOMATED)
Bilirubin, UA: NEGATIVE
Blood, UA: NEGATIVE
Glucose, UA: NEGATIVE
Ketones, UA: NEGATIVE
LEUKOCYTES UA: NEGATIVE
NITRITE UA: NEGATIVE
PH UA: 6.5
Spec Grav, UA: 1.015
UROBILINOGEN UA: 0.2

## 2016-08-02 LAB — HEMOGLOBIN A1C: Hgb A1c MFr Bld: 6 % (ref 4.6–6.5)

## 2016-08-02 LAB — LIPID PANEL
CHOL/HDL RATIO: 2
Cholesterol: 131 mg/dL (ref 0–200)
HDL: 64.1 mg/dL (ref >39.00)
LDL CALC: 34 mg/dL (ref 0–99)
NONHDL: 66.58
Triglycerides: 161 mg/dL — ABNORMAL HIGH (ref 0.0–149.0)
VLDL: 32.2 mg/dL (ref 0.0–40.0)

## 2016-08-02 LAB — TSH: TSH: 3.22 u[IU]/mL (ref 0.35–4.50)

## 2016-08-02 LAB — URIC ACID: Uric Acid, Serum: 4.2 mg/dL (ref 2.4–7.0)

## 2016-08-02 MED ORDER — ALLOPURINOL 300 MG PO TABS: 300.0000 mg | ORAL_TABLET | Freq: Every day | ORAL | 3 refills | Status: DC

## 2016-08-02 MED ORDER — HYDROCHLOROTHIAZIDE 25 MG PO TABS: 25.0000 mg | ORAL_TABLET | Freq: Every day | ORAL | 3 refills | Status: DC

## 2016-08-02 MED ORDER — DIAZEPAM 5 MG PO TABS: 5.0000 mg | ORAL_TABLET | Freq: Three times a day (TID) | ORAL | 5 refills | Status: DC | PRN

## 2016-08-02 MED ORDER — HYDROCODONE-ACETAMINOPHEN 10-325 MG PO TABS: 1.0000 | ORAL_TABLET | Freq: Three times a day (TID) | ORAL | 0 refills | Status: DC | PRN

## 2016-08-02 MED ORDER — ATORVASTATIN CALCIUM 40 MG PO TABS: 40.0000 mg | ORAL_TABLET | Freq: Every day | ORAL | 3 refills | Status: DC

## 2016-08-02 MED ORDER — GLIPIZIDE ER 5 MG PO TB24: 5.0000 mg | ORAL_TABLET | Freq: Every day | ORAL | 3 refills | Status: DC

## 2016-08-02 MED ORDER — RANITIDINE HCL 150 MG PO TABS: 150.0000 mg | ORAL_TABLET | Freq: Two times a day (BID) | ORAL | 3 refills | Status: DC

## 2016-08-02 MED ORDER — DICLOFENAC SODIUM 75 MG PO TBEC: 75.0000 mg | DELAYED_RELEASE_TABLET | Freq: Two times a day (BID) | ORAL | 3 refills | Status: DC | PRN

## 2016-08-02 MED ORDER — DILTIAZEM HCL ER 180 MG PO CP24: 180.0000 mg | ORAL_CAPSULE | Freq: Every day | ORAL | 3 refills | Status: DC

## 2016-08-02 NOTE — Progress Notes (Signed)
   Subjective:    Patient ID: Deborah Bryant, female    DOB: [birth year], 70 y.o.   MRN: [id number]  HPI 70 yr old female for a well exam. She is doing well in general. Her arthritis bothers her a lot, especially the shoulders, but she remains active she goes to the gym and works out with a Clinical research associate several days a week.    Review of Systems  Constitutional: Negative.   HENT: Negative.   Eyes: Negative.   Respiratory: Negative.   Cardiovascular: Negative.   Gastrointestinal: Negative.   Genitourinary: Negative for decreased urine volume, difficulty urinating, dyspareunia, dysuria, enuresis, flank pain, frequency, hematuria, pelvic pain and urgency.  Musculoskeletal: Positive for arthralgias. Negative for back pain, gait problem, joint swelling, myalgias, neck pain and neck stiffness.  Skin: Negative.   Neurological: Negative.   Psychiatric/Behavioral: Negative.        Objective:   Physical Exam  Constitutional: She is oriented to person, place, and time. She appears well-developed and well-nourished. No distress.  HENT:  Head: Normocephalic and atraumatic.  Right Ear: External ear normal.  Left Ear: External ear normal.  Nose: Nose normal.  Mouth/Throat: Oropharynx is clear and moist. No oropharyngeal exudate.  Eyes: Conjunctivae and EOM are normal. Pupils are equal, round, and reactive to light. No scleral icterus.  Neck: Normal range of motion. Neck supple. No JVD present. No thyromegaly present.  Cardiovascular: Normal rate, regular rhythm, normal heart sounds and intact distal pulses.  Exam reveals no gallop and no friction rub.   No murmur heard. EKG normal  Pulmonary/Chest: Effort normal and breath sounds normal. No respiratory distress. She has no wheezes. She has no rales. She exhibits no tenderness.  Abdominal: Soft. Bowel sounds are normal. She exhibits no distension and no mass. There is no tenderness. There is no rebound and no guarding.  Musculoskeletal: Normal range of  motion. She exhibits no edema or tenderness.  Lymphadenopathy:    She has no cervical adenopathy.  Neurological: She is alert and oriented to person, place, and time. She has normal reflexes. No cranial nerve deficit. She exhibits normal muscle tone. Coordination normal.  Skin: Skin is warm and dry. No rash noted. No erythema.  Psychiatric: She has a normal mood and affect. Her behavior is normal. Judgment and thought content normal.          Assessment & Plan:  Well exam. We discussed diet and exercise. Get fasting labs.  Laurey Morale, MD

## 2016-08-02 NOTE — Progress Notes (Signed)
Pre visit review using our clinic review tool, if applicable. No additional management support is needed unless otherwise documented below in the visit note. 

## 2016-08-06 ENCOUNTER — Encounter: Payer: Self-pay | Admitting: Family Medicine

## 2016-08-12 ENCOUNTER — Other Ambulatory Visit: Payer: Self-pay | Admitting: Family Medicine

## 2016-08-14 ENCOUNTER — Encounter: Payer: Self-pay | Admitting: Family Medicine

## 2016-08-14 MED ORDER — PROMETHAZINE VC/CODEINE 6.25-5-10 MG/5ML PO SYRP: 5.0000 mL | ORAL_SOLUTION | Freq: Two times a day (BID) | ORAL | 0 refills | Status: DC | PRN

## 2016-08-14 NOTE — Telephone Encounter (Signed)
Pt is going town in a couple days and would like to pick up rx asap

## 2016-08-14 NOTE — Addendum Note (Signed)
Addended by: Alysia Penna A on: 08/14/2016 05:00 PM   Modules accepted: Orders

## 2016-08-14 NOTE — Telephone Encounter (Signed)
done

## 2016-08-28 ENCOUNTER — Encounter: Payer: Self-pay | Admitting: Family Medicine

## 2016-08-28 ENCOUNTER — Ambulatory Visit (INDEPENDENT_AMBULATORY_CARE_PROVIDER_SITE_OTHER): Payer: Medicare Other | Admitting: Family Medicine

## 2016-08-28 VITALS — BP 138/70 | HR 77 | Temp 98.0°F | Ht 62.0 in | Wt 163.0 lb

## 2016-08-28 DIAGNOSIS — J209 Acute bronchitis, unspecified: Secondary | ICD-10-CM

## 2016-08-28 MED ORDER — HYDROCODONE-HOMATROPINE 5-1.5 MG/5ML PO SYRP: 5.0000 mL | ORAL_SOLUTION | ORAL | 0 refills | Status: DC | PRN

## 2016-08-28 MED ORDER — AZITHROMYCIN 250 MG PO TABS: ORAL_TABLET | ORAL | 0 refills | Status: DC

## 2016-08-28 NOTE — Progress Notes (Signed)
Pre visit review using our clinic review tool, if applicable. No additional management support is needed unless otherwise documented below in the visit note. 

## 2016-08-28 NOTE — Progress Notes (Signed)
   Subjective:    Patient ID: Deborah Bryant, female    DOB: Nov 09, 1946, 70 y.o.   MRN: [id number]  HPI Here for 3 days of a dry cough and some right sided middle back pain. No fever.    Review of Systems  Constitutional: Negative.   HENT: Negative.   Eyes: Negative.   Respiratory: Positive for cough. Negative for shortness of breath and wheezing.   Cardiovascular: Negative.        Objective:   Physical Exam  Constitutional: She appears well-developed and well-nourished.  HENT:  Right Ear: External ear normal.  Left Ear: External ear normal.  Nose: Nose normal.  Mouth/Throat: Oropharynx is clear and moist.  Eyes: Conjunctivae are normal.  Neck: No thyromegaly present.  Pulmonary/Chest: Effort normal and breath sounds normal. No respiratory distress. She has no wheezes. She has no rales.  Tender over the right middle back below the scapula   Lymphadenopathy:    She has no cervical adenopathy.          Assessment & Plan:  Bronchitis wit some muscular pain from coughing. Treat with a Zpack. Laurey Morale, MD

## 2016-09-10 ENCOUNTER — Telehealth: Payer: Self-pay | Admitting: Family Medicine

## 2016-09-10 MED ORDER — PROMETHAZINE VC/CODEINE 6.25-5-10 MG/5ML PO SYRP: 5.0000 mL | ORAL_SOLUTION | Freq: Two times a day (BID) | ORAL | 0 refills | Status: DC | PRN

## 2016-09-10 NOTE — Telephone Encounter (Signed)
Pt needs new rx promethazine w/codeine

## 2016-09-10 NOTE — Telephone Encounter (Signed)
My mistake. She can actually have the refill now. Done.

## 2016-09-10 NOTE — Telephone Encounter (Signed)
NO this is too soon. I cannot write for this until 09-27-16.

## 2016-09-17 ENCOUNTER — Ambulatory Visit (INDEPENDENT_AMBULATORY_CARE_PROVIDER_SITE_OTHER): Payer: Medicare Other

## 2016-09-17 DIAGNOSIS — Z23 Encounter for immunization: Secondary | ICD-10-CM | POA: Diagnosis not present

## 2016-09-30 ENCOUNTER — Telehealth: Payer: Self-pay | Admitting: Family Medicine

## 2016-09-30 MED ORDER — PROMETHAZINE VC/CODEINE 6.25-5-10 MG/5ML PO SYRP: 5.0000 mL | ORAL_SOLUTION | Freq: Two times a day (BID) | ORAL | 0 refills | Status: DC | PRN

## 2016-09-30 NOTE — Telephone Encounter (Signed)
done

## 2016-10-23 ENCOUNTER — Telehealth: Payer: Self-pay | Admitting: Family Medicine

## 2016-10-23 MED ORDER — PROMETHAZINE VC/CODEINE 6.25-5-10 MG/5ML PO SYRP: 5.0000 mL | ORAL_SOLUTION | Freq: Two times a day (BID) | ORAL | 0 refills | Status: DC | PRN

## 2016-10-23 NOTE — Telephone Encounter (Signed)
Pt needs new rx promethazine

## 2016-10-23 NOTE — Telephone Encounter (Signed)
done

## 2016-10-24 NOTE — Telephone Encounter (Signed)
Script is ready for pick up here at front office and pt is aware.  

## 2016-10-30 ENCOUNTER — Telehealth: Payer: Self-pay | Admitting: Family Medicine

## 2016-10-30 MED ORDER — HYDROCODONE-ACETAMINOPHEN 10-325 MG PO TABS: 1.0000 | ORAL_TABLET | Freq: Three times a day (TID) | ORAL | 0 refills | Status: DC | PRN

## 2016-10-30 NOTE — Telephone Encounter (Signed)
done

## 2016-11-12 ENCOUNTER — Ambulatory Visit (INDEPENDENT_AMBULATORY_CARE_PROVIDER_SITE_OTHER): Payer: Medicare Other | Admitting: *Deleted

## 2016-11-12 DIAGNOSIS — Z23 Encounter for immunization: Secondary | ICD-10-CM

## 2016-11-13 ENCOUNTER — Telehealth: Payer: Self-pay | Admitting: Family Medicine

## 2016-11-13 NOTE — Telephone Encounter (Signed)
Patient Name: Deborah Bryant  DOB: [birth year]    Initial Comment Caller got pneumonia 23 shot yesterday, arm is now swollen, hard, warm to touch.   Nurse Assessment  Nurse: Raphael Gibney, RN, Vanita Ingles Date/Time (Eastern Time): 11/13/2016 10:32:10 AM  Confirm and document reason for call. If symptomatic, describe symptoms. You must click the next button to save text entered. ---Caller states she had pneumonia 23 immunization yesterday. Has swelling from the injection from the injection site to her elbow. Area is red and hard.  Does the patient have any new or worsening symptoms? ---Yes  Will a triage be completed? ---Yes  Related visit to physician within the last 2 weeks? ---No  Does the PT have any chronic conditions? (i.e. diabetes, asthma, etc.) ---Yes  List chronic conditions. ---type 2 diabetes; HTN  Is this a behavioral health or substance abuse call? ---No     Guidelines    Guideline Title Affirmed Question Affirmed Notes  Immunization Reactions Injection site reaction to any vaccine (all triage questions negative)    Final Disposition User   [facility] [place], RN, Vanita Ingles    Disagree/Comply: Comply

## 2016-11-13 NOTE — Telephone Encounter (Signed)
Have her continue the ice and Benadryl. If she gets a fever go to ER tonight. Otherwise if she is not better by tomorrow have her see me OV. I can work her into the schedule if needed.

## 2016-11-13 NOTE — Telephone Encounter (Signed)
Spoke with pt and gave recommendations. She will take Benadryl and use ice as directed. She will call tomorrow and advise of condition.

## 2016-11-13 NOTE — Telephone Encounter (Signed)
Pt is returning sheena call

## 2016-11-13 NOTE — Telephone Encounter (Signed)
LMTCB

## 2016-11-13 NOTE — Telephone Encounter (Signed)
Spoke with pt and she states that the arm where she received her Pna 23 vacc is hot and swollen. She denies any decreased ROM. She has taken 2 Benadryl without improvement. Pt has applied ice pack with no improvement. She denies other s/s such as f/n/v. She is concerned that the area is continuing to turn hurt and the redness is spreading.   Dr. Sarajane Jews - Please advise. Thanks!

## 2016-11-14 NOTE — Telephone Encounter (Signed)
Noted  

## 2016-11-14 NOTE — Telephone Encounter (Signed)
Pt state that she is feeling better with doing the regiment of benadryl and icing and will continue with that.

## 2016-11-15 ENCOUNTER — Ambulatory Visit (INDEPENDENT_AMBULATORY_CARE_PROVIDER_SITE_OTHER): Payer: Medicare Other | Admitting: Adult Health

## 2016-11-15 ENCOUNTER — Encounter: Payer: Self-pay | Admitting: Adult Health

## 2016-11-15 VITALS — BP 140/80 | Temp 98.2°F | Ht 62.0 in | Wt 164.9 lb

## 2016-11-15 DIAGNOSIS — T50Z95A Adverse effect of other vaccines and biological substances, initial encounter: Secondary | ICD-10-CM | POA: Diagnosis not present

## 2016-11-15 MED ORDER — HYDROCODONE-HOMATROPINE 5-1.5 MG/5ML PO SYRP: 5.0000 mL | ORAL_SOLUTION | ORAL | 0 refills | Status: DC | PRN

## 2016-11-15 NOTE — Telephone Encounter (Signed)
Pt called and states that the area is now spreading and has gone below her elbow. It is still painful, red and swollen. Pt scheduled to see Tommi Rumps this afternoon. Nothing further needed.

## 2016-11-15 NOTE — Progress Notes (Signed)
Subjective:    Patient ID: Deborah Bryant, female    DOB: Oct 03, 1946, 70 y.o.   MRN: [id number]  HPI  70 year old female who presents to the office after possible allergic reaction to PNA 23 vaccination. She reports that she had the vaccination three days ago and that evening she started to have pain and redness around the injection site. She felt as though her arm was swelling. Although the redness and warmth has diminished she feels as though the " reaction is moving its way down my left arm."   She has been taking Benadryl and using ice which helps slightly.   Review of Systems  Constitutional: Negative.   Respiratory: Negative.   Cardiovascular: Negative.   Musculoskeletal: Positive for joint swelling.  Skin: Positive for color change.  All other systems reviewed and are negative.  Past Medical History:  Diagnosis Date  . Allergic rhinitis   . Arthritis   . Cellulitis, periorbital 2006   left eye, due to MRSA, saw Dr. Erik Obey   . Cough   . Diabetes mellitus   . Hemorrhoids   . Hyperlipidemia   . Hypertension   . IBS (irritable bowel syndrome)   . Menopause   . 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 Pneumonia   . Tubular adenoma of colon 12/2006    Social History   Social History  . Marital status: Married    Spouse name: N/A  . Number of children: N/A  . Years of education: N/A   Occupational History  . Not on file.   Social History Main Topics  . Smoking status: Current Every Day Smoker    Packs/day: 1.00    Years: 1.00    Types: Cigarettes  . Smokeless tobacco: Never Used  . Alcohol use 0.0 oz/week     Comment: glass of wine daily  . Drug use: No  . Sexual activity: Yes    Birth control/ protection: Post-menopausal   Other Topics Concern  . Not on file   Social History Narrative  . No narrative on file    Past Surgical History:  Procedure Laterality Date  . APPENDECTOMY    . COLONOSCOPY  04-01-12   per Dr.  Fuller Plan, tubular adenoma, repeat in 5 yrs   . POLYPECTOMY    . TUBAL LIGATION      Family History  Problem Relation Age of Onset  . Alcohol abuse Mother   . Diabetes Mother   . Heart disease Father   . Heart disease Brother     Allergies  Allergen Reactions  . Losartan Shortness Of Breath  . Benazepril Cough  . Lasix [Furosemide]     Rash, changes in eyesight  . Levaquin [Levofloxacin]     Tendon and muscle pain  . Phenobarbital     REACTION: unspecified    Current Outpatient Prescriptions on File Prior to Visit  Medication Sig Dispense Refill  . allopurinol (ZYLOPRIM) 300 MG tablet Take 1 tablet (300 mg total) by mouth daily. 90 tablet 3  . Ascorbic Acid (VITAMIN C) 1000 MG tablet Take 1,000 mg by mouth daily.    Marland Kitchen aspirin EC 81 MG tablet Take 81 mg by mouth daily.    Marland Kitchen atorvastatin (LIPITOR) 40 MG tablet Take 1 tablet (40 mg total) by mouth daily. 90 tablet 3  . azithromycin (ZITHROMAX Z-PAK) 250 MG tablet As directed 6 each 0  . cholecalciferol (VITAMIN D) 1000 UNITS tablet Take 2,000  Units by mouth daily.     . diazepam (VALIUM) 5 MG tablet Take 1 tablet (5 mg total) by mouth every 8 (eight) hours as needed for anxiety. For anxiety 90 tablet 5  . diclofenac (VOLTAREN) 75 MG EC tablet Take 1 tablet (75 mg total) by mouth 2 (two) times daily as needed for moderate pain. 180 tablet 3  . diltiazem (DILACOR XR) 180 MG 24 hr capsule Take 1 capsule (180 mg total) by mouth daily. 90 capsule 3  . glipiZIDE (GLUCOTROL XL) 5 MG 24 hr tablet Take 1 tablet (5 mg total) by mouth daily. 90 tablet 3  . hydrochlorothiazide (HYDRODIURIL) 25 MG tablet Take 1 tablet (25 mg total) by mouth daily. 90 tablet 3  . HYDROcodone-acetaminophen (NORCO) 10-325 MG tablet Take 1 tablet by mouth every 8 (eight) hours as needed for moderate pain. 120 tablet 0  . polyethylene glycol powder (GLYCOLAX/MIRALAX) powder Take 17 g by mouth daily.    . Promethazine-Phenyleph-Codeine (PROMETHAZINE VC/CODEINE)  6.25-5-10 MG/5ML SYRP Take 5 mLs by mouth 2 (two) times daily as needed. 240 mL 0  . ranitidine (ZANTAC) 150 MG tablet Take 1 tablet (150 mg total) by mouth 2 (two) times daily. 180 tablet 3  . saccharomyces boulardii (FLORASTOR) 250 MG capsule Take 250 mg by mouth daily.    . fluticasone (FLONASE) 50 MCG/ACT nasal spray Place 2 sprays into both nostrils 2 (two) times daily as needed. For allergies 48 g 3   No current facility-administered medications on file prior to visit.     BP 140/80   Temp 98.2 F (36.8 C) (Oral)   Ht 5\' 2"  (1.575 m)   Wt 164 lb 14.4 oz (74.8 kg)   BMI 30.16 kg/m       Objective:   Physical Exam  Constitutional: She is oriented to person, place, and time. She appears well-developed and well-nourished. No distress.  Cardiovascular: Normal rate, regular rhythm, normal heart sounds and intact distal pulses.  Exam reveals no gallop and no friction rub.   No murmur heard. Pulmonary/Chest: Effort normal and breath sounds normal. No respiratory distress. She has no wheezes. She has no rales. She exhibits no tenderness.  Neurological: She is alert and oriented to person, place, and time.  Skin: Skin is warm and dry. No rash noted. She is not diaphoretic. No erythema. No pallor.  Trace redness noted on left upper arm. No swelling or warmth noted. She has full ROM  Psychiatric: She has a normal mood and affect. Her behavior is normal. Judgment and thought content normal.  Nursing note and vitals reviewed.     Assessment & Plan:  1. Vaccination reaction, initial encounter - possible vaccination reaction  - Continue to use ice - Can stop benadryl.  - Should be resolved after 7 total days.  - Follow up as needed  Dorothyann Peng, NP

## 2016-12-11 ENCOUNTER — Telehealth: Payer: Self-pay | Admitting: Family Medicine

## 2016-12-11 NOTE — Telephone Encounter (Signed)
Pt states that she needs a refill for her Promethazine-Phenyleph-Codeine (PROMETHAZINE VC/CODEINE) 6.25-5-10 MG/5ML SYRP.  Pt states that this is an rx that she picks up.

## 2016-12-13 MED ORDER — PROMETHAZINE VC/CODEINE 6.25-5-10 MG/5ML PO SYRP: 5.0000 mL | ORAL_SOLUTION | Freq: Two times a day (BID) | ORAL | 0 refills | Status: DC | PRN

## 2016-12-13 NOTE — Addendum Note (Signed)
Addended by: Alysia Penna A on: 12/13/2016 09:44 AM   Modules accepted: Orders

## 2016-12-13 NOTE — Telephone Encounter (Signed)
done

## 2016-12-13 NOTE — Telephone Encounter (Signed)
Script is ready for pick up here at front office and I spoke with pt.  

## 2016-12-27 ENCOUNTER — Encounter: Payer: Self-pay | Admitting: Family Medicine

## 2016-12-27 ENCOUNTER — Ambulatory Visit (INDEPENDENT_AMBULATORY_CARE_PROVIDER_SITE_OTHER): Payer: Medicare Other | Admitting: Family Medicine

## 2016-12-27 VITALS — BP 141/77 | HR 84 | Temp 98.2°F | Ht 62.0 in | Wt 162.0 lb

## 2016-12-27 DIAGNOSIS — J209 Acute bronchitis, unspecified: Secondary | ICD-10-CM

## 2016-12-27 MED ORDER — HYDROCOD POLST-CPM POLST ER 10-8 MG/5ML PO SUER: 5.0000 mL | Freq: Two times a day (BID) | ORAL | 0 refills | Status: DC | PRN

## 2016-12-27 MED ORDER — AZITHROMYCIN 250 MG PO TABS: ORAL_TABLET | ORAL | 0 refills | Status: DC

## 2016-12-27 NOTE — Progress Notes (Signed)
   Subjective:    Patient ID: Deborah Bryant, female    DOB: 13-Sep-1946, 71 y.o.   MRN: [id number]  HPI Here with her husband who is recovering from a bronchitis, and she has developed similar symptoms including chest congestion and coughing up yellow sputum. No fever.    Review of Systems  Constitutional: Negative.   HENT: Positive for congestion and postnasal drip. Negative for sinus pain, sinus pressure and sore throat.   Eyes: Negative.   Respiratory: Positive for cough, chest tightness and wheezing.   Cardiovascular: Negative.        Objective:   Physical Exam  Constitutional: She appears well-developed and well-nourished.  HENT:  Right Ear: External ear normal.  Left Ear: External ear normal.  Nose: Nose normal.  Mouth/Throat: Oropharynx is clear and moist.  Eyes: Conjunctivae are normal.  Neck: No thyromegaly present.  Pulmonary/Chest: Effort normal. No respiratory distress. She has no rales.  Scattered wheezes and rhonchi   Lymphadenopathy:    She has no cervical adenopathy.          Assessment & Plan:  Bronchitis, treat with a Zpack.  Alysia Penna, MD

## 2016-12-27 NOTE — Progress Notes (Signed)
Pre visit review using our clinic review tool, if applicable. No additional management support is needed unless otherwise documented below in the visit note. 

## 2017-01-13 ENCOUNTER — Telehealth: Payer: Self-pay | Admitting: Family Medicine

## 2017-01-13 MED ORDER — PROMETHAZINE VC/CODEINE 6.25-5-10 MG/5ML PO SYRP: 5.0000 mL | ORAL_SOLUTION | Freq: Two times a day (BID) | ORAL | 0 refills | Status: DC | PRN

## 2017-01-13 NOTE — Telephone Encounter (Signed)
done

## 2017-01-27 ENCOUNTER — Encounter: Payer: Self-pay | Admitting: Gastroenterology

## 2017-02-12 ENCOUNTER — Telehealth: Payer: Self-pay | Admitting: Family Medicine

## 2017-02-12 MED ORDER — PROMETHAZINE VC/CODEINE 6.25-5-10 MG/5ML PO SYRP: 5.0000 mL | ORAL_SOLUTION | Freq: Two times a day (BID) | ORAL | 0 refills | Status: DC | PRN

## 2017-02-12 NOTE — Telephone Encounter (Signed)
done

## 2017-02-14 IMAGING — CT CT CHEST W/O CM
2 of 4 series · 15 of 36 positions shown, 18 images · non-contrast
Comparison: Chest radiograph of June 22, 2015; CT scan of chest March 17, 2012.

CLINICAL DATA: Right-sided chest pain.

EXAM:
CT CHEST WITHOUT CONTRAST
TECHNIQUE: Multidetector CT imaging of the chest was performed following the
standard protocol without IV contrast.

[Series 2: chest routine with · axial · 0.65mm/px · z∈[-288,-44]mm · 12 of 59 slices shown, 15 images]
[im 5/59  mediastinal]
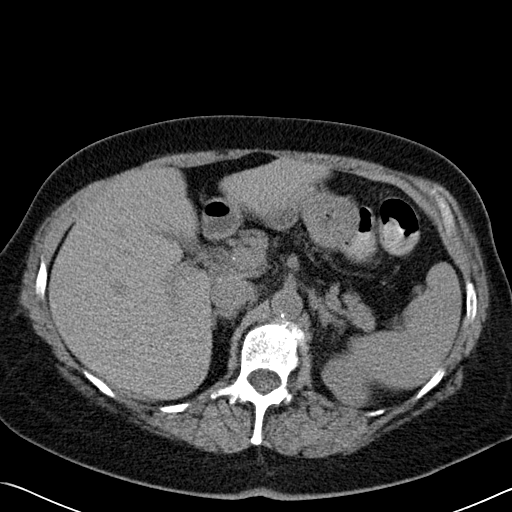
[im 5/59  lung]
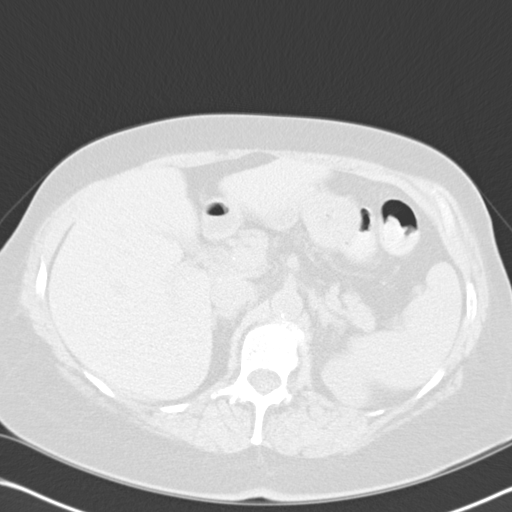
[im 9/59  lung]
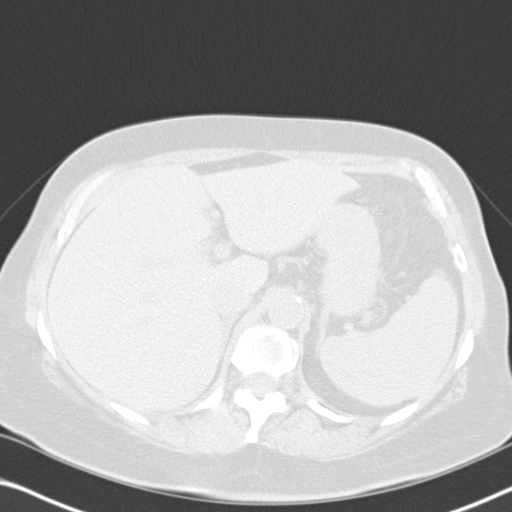
[im 14/59  lung]
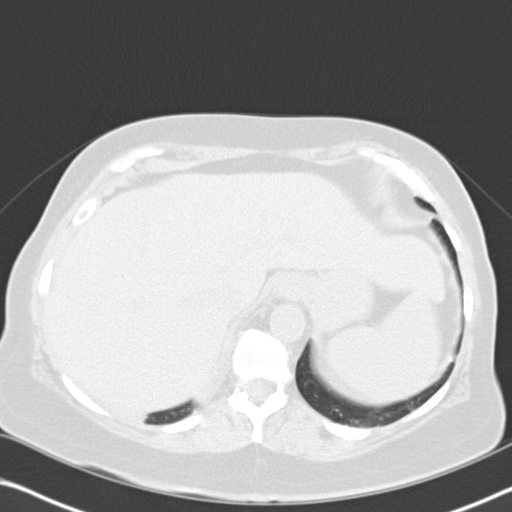
[im 18/59  lung]
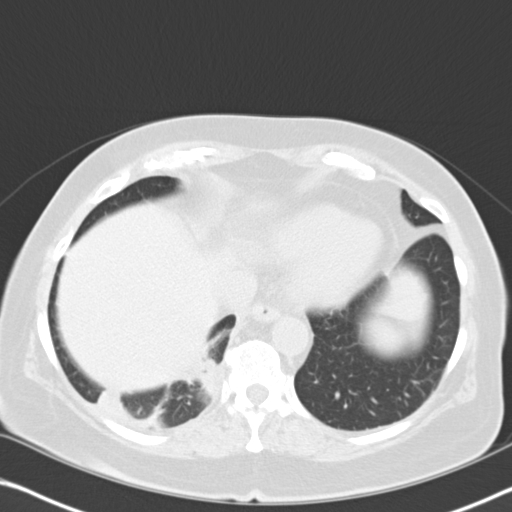
[im 23/59  mediastinal]
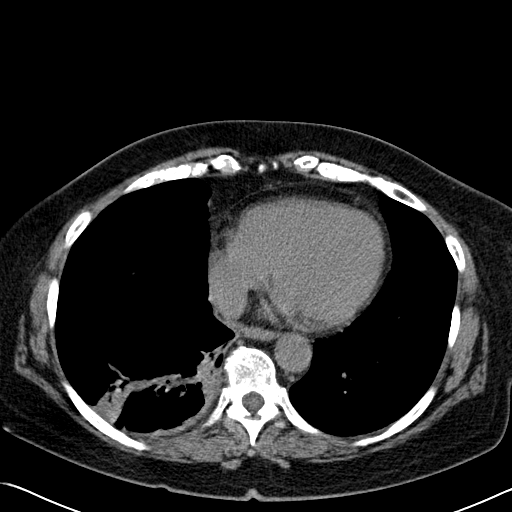
[im 23/59  lung]
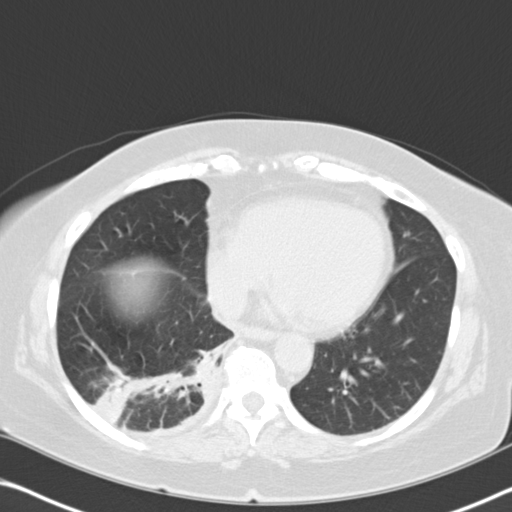
[im 27/59  lung]
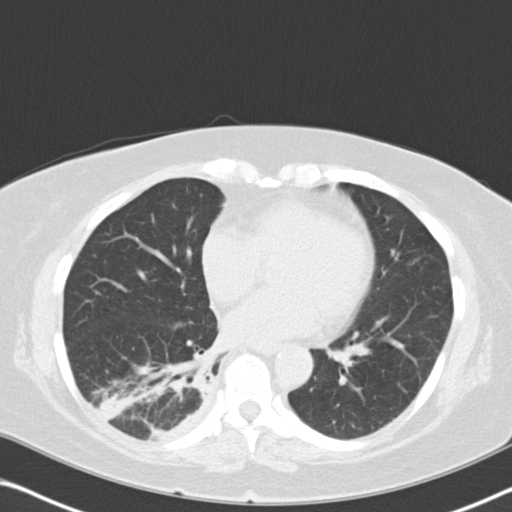
[im 32/59  lung]
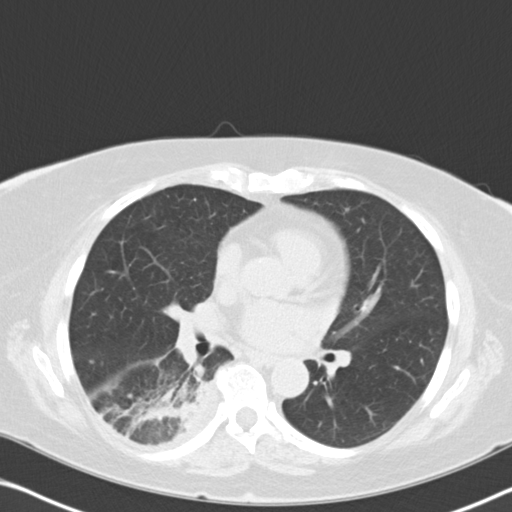
[im 36/59  lung]
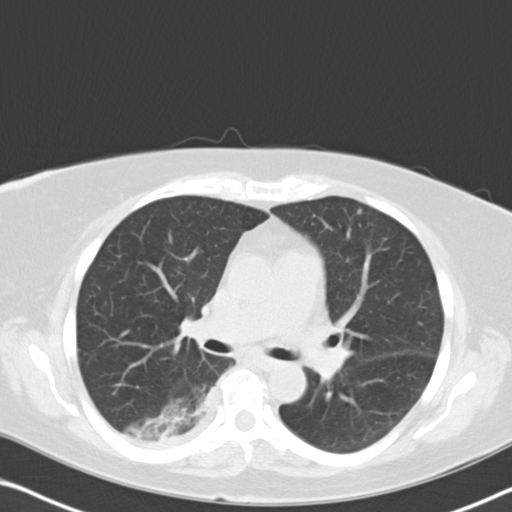
[im 41/59  mediastinal]
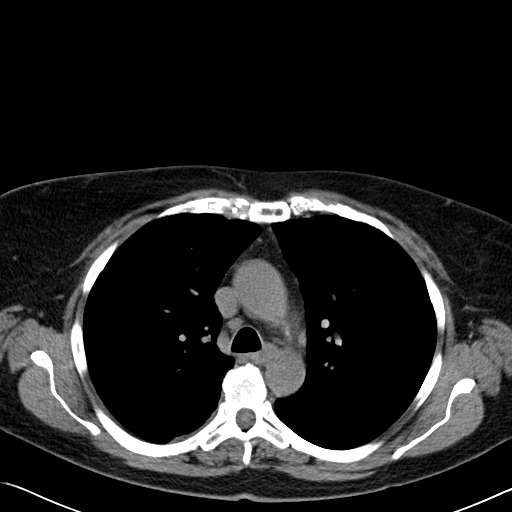
[im 41/59  lung]
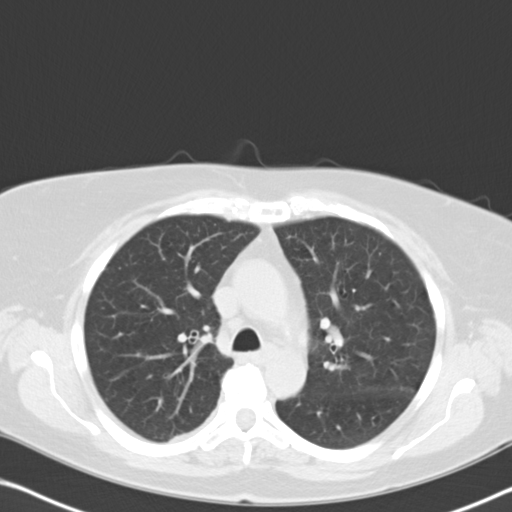
[im 45/59  lung]
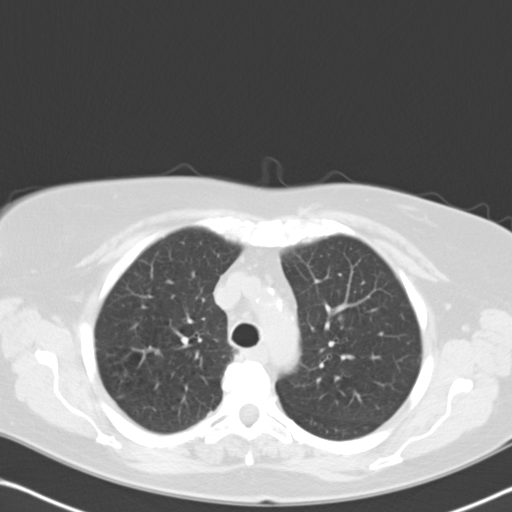
[im 50/59  lung]
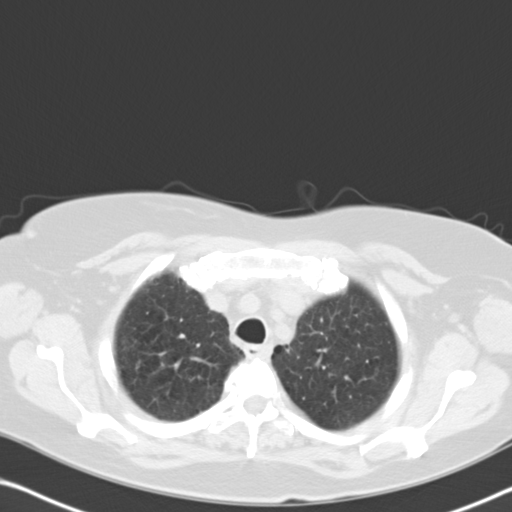
[im 54/59  lung]
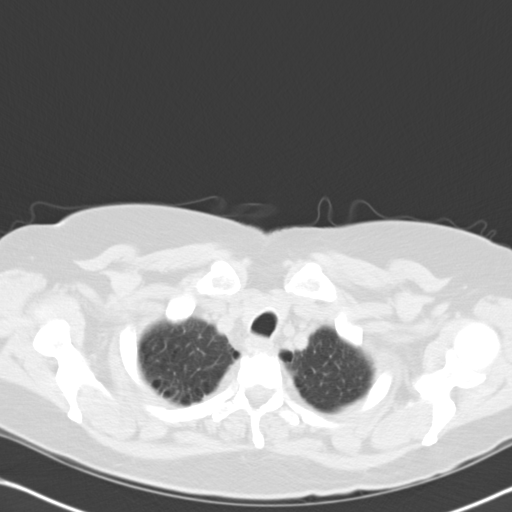

[Series 602: cor · coronal · 0.65mm/px · 3 of 103 slices shown]
[im 21/103  lung]
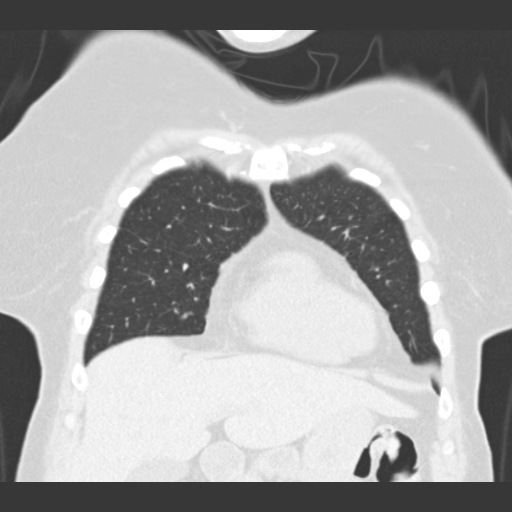
[im 41/103  lung]
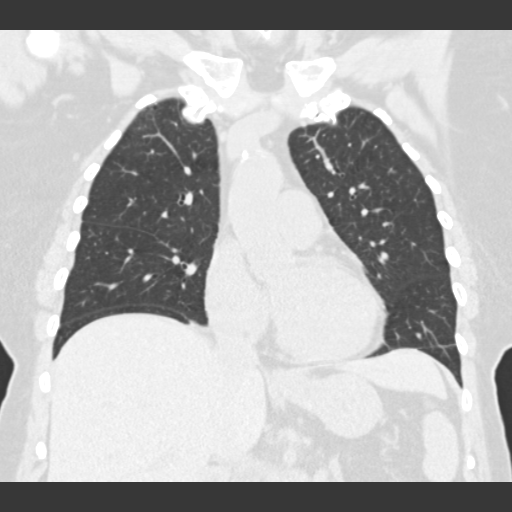
[im 62/103  lung]
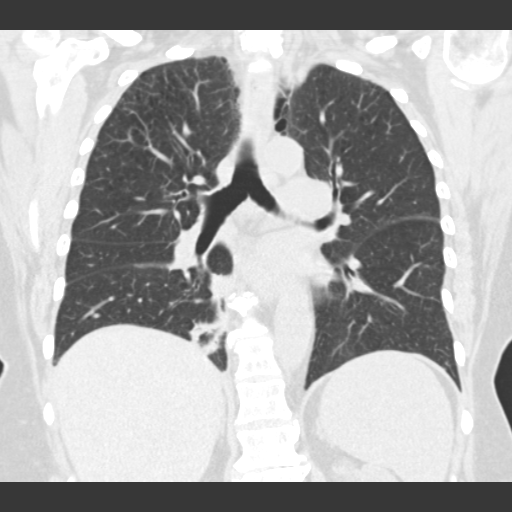

[15 of 36 positions shown; findings below may reference images not displayed]

FINDINGS: No pneumothorax is noted. Stable small curvilinear density is seen
inferiorly in the lingular portion of the left upper lobe compared
to prior exam and therefore most likely represents scarring. 4 mm
ill-defined nodule is noted laterally in the right upper lobe best
seen on image number 17 of series 3. This is not significantly
changed compared to prior exam, and can be considered benign at this
point with no further follow-up required. New 3 mm nodule is noted
laterally in the right upper lobe best seen on image number 14 of
series 3. New 3 mm nodule is also noted anteriorly in the left upper
lobe best seen on image number 24 series 3 Moderate airspace opacity
with air bronchograms is noted posteriorly in the right lower lobe
with small adjacent pleural effusion. This is most consistent with
pneumonia.

Atherosclerosis of thoracic aorta is noted without aneurysm
formation. No significant mediastinal mass or adenopathy is noted.
Visualized portion of upper abdomen is unremarkable. No significant
osseous abnormality is noted in the chest.
IMPRESSION: Atherosclerosis of thoracic aorta without aneurysm formation.

Moderate airspace opacity with air bronchograms is noted posteriorly
in the right lower lobe with small adjacent pleural effusion, most
consistent with pneumonia. Followup radiographs or CT scan are
recommended to ensure resolution.

New 3 mm nodule is noted laterally in the right upper lobe as well
as another new 3 mm nodule seen anteriorly in the left upper lobe.
If the patient is at high risk for bronchogenic carcinoma, follow-up
chest CT at 1 year is recommended. If the patient is at low risk, no
follow-up is needed. This recommendation follows the consensus
statement: Guidelines for Management of Small Pulmonary Nodules
Detected on CT Scans: A Statement from the [HOSPITAL] as

## 2017-03-10 ENCOUNTER — Encounter: Payer: Self-pay | Admitting: Family Medicine

## 2017-03-12 ENCOUNTER — Telehealth: Payer: Self-pay | Admitting: Family Medicine

## 2017-03-12 MED ORDER — PROMETHAZINE VC/CODEINE 6.25-5-10 MG/5ML PO SYRP: 5.0000 mL | ORAL_SOLUTION | Freq: Two times a day (BID) | ORAL | 0 refills | Status: DC | PRN

## 2017-03-12 NOTE — Telephone Encounter (Signed)
Pt need new Rx for promethazine-phenyleph-codeine  Pt would like to pick it up before the weekend.

## 2017-03-12 NOTE — Telephone Encounter (Signed)
done

## 2017-03-12 NOTE — Telephone Encounter (Signed)
Script is ready for pick up here at front office and sent pt a my chart message.

## 2017-03-17 MED ORDER — HYDROCODONE-ACETAMINOPHEN 10-325 MG PO TABS: 1.0000 | ORAL_TABLET | Freq: Three times a day (TID) | ORAL | 0 refills | Status: DC | PRN

## 2017-03-17 NOTE — Telephone Encounter (Signed)
done

## 2017-04-07 ENCOUNTER — Telehealth: Payer: Self-pay | Admitting: Family Medicine

## 2017-04-07 MED ORDER — PROMETHAZINE VC/CODEINE 6.25-5-10 MG/5ML PO SYRP: 5.0000 mL | ORAL_SOLUTION | Freq: Two times a day (BID) | ORAL | 0 refills | Status: DC | PRN

## 2017-04-07 NOTE — Telephone Encounter (Signed)
° ° ° ° °  Pt request refill of the following: ° ° °Promethazine-Phenyleph-Codeine (PROMETHAZINE VC/CODEINE) 6.25-5-10 MG/5ML SYRP ° °Phamacy: °

## 2017-04-07 NOTE — Telephone Encounter (Signed)
Ready to pick up.  

## 2017-04-08 NOTE — Telephone Encounter (Signed)
Script is ready for pick up here at front office and I spoke with Deborah Bryant.  

## 2017-04-09 ENCOUNTER — Ambulatory Visit (INDEPENDENT_AMBULATORY_CARE_PROVIDER_SITE_OTHER): Payer: Medicare Other | Admitting: Family Medicine

## 2017-04-09 ENCOUNTER — Encounter: Payer: Self-pay | Admitting: Family Medicine

## 2017-04-09 VITALS — BP 138/78 | HR 71 | Temp 98.0°F | Ht 62.0 in | Wt 169.0 lb

## 2017-04-09 DIAGNOSIS — J209 Acute bronchitis, unspecified: Secondary | ICD-10-CM | POA: Diagnosis not present

## 2017-04-09 MED ORDER — HYDROCOD POLST-CPM POLST ER 10-8 MG/5ML PO SUER: 5.0000 mL | Freq: Two times a day (BID) | ORAL | 0 refills | Status: DC | PRN

## 2017-04-09 MED ORDER — AZITHROMYCIN 250 MG PO TABS: ORAL_TABLET | ORAL | 0 refills | Status: DC

## 2017-04-09 NOTE — Progress Notes (Signed)
   Subjective:    Patient ID: Deborah Bryant, female    DOB: [birth year], 71 y.o.   MRN: [id number]  HPI Here for 4 days of chest congestion and coughing up green sputum. No fever. On Mucinex.    Review of Systems  Constitutional: Negative.   HENT: Negative.   Eyes: Negative.   Respiratory: Positive for cough and chest tightness. Negative for wheezing.   Cardiovascular: Negative.        Objective:   Physical Exam  Constitutional: She appears well-developed and well-nourished.  HENT:  Right Ear: External ear normal.  Left Ear: External ear normal.  Nose: Nose normal.  Mouth/Throat: Oropharynx is clear and moist.  Eyes: Conjunctivae are normal.  Neck: No thyromegaly present.  Pulmonary/Chest: Effort normal. No respiratory distress. She has no wheezes. She has no rales.  Scattered rhonchi   Lymphadenopathy:    She has no cervical adenopathy.          Assessment & Plan:  Bronchitis, treat with a Zpack.  Alysia Penna, MD

## 2017-04-09 NOTE — Patient Instructions (Signed)
WE NOW OFFER   [facility] FAST TRACK!!!  SAME DAY Appointments for ACUTE CARE  Such as: Sprains, Injuries, cuts, abrasions, rashes, muscle pain, joint pain, back pain Colds, flu, sore throats, headache, allergies, cough, fever  Ear pain, sinus and eye infections Abdominal pain, nausea, vomiting, diarrhea, upset stomach Animal/insect bites  3 Easy Ways to Schedule: Walk-In Scheduling Call in scheduling Mychart Sign-up: [email or web address]         

## 2017-04-09 NOTE — Progress Notes (Signed)
Pre visit review using our clinic review tool, if applicable. No additional management support is needed unless otherwise documented below in the visit note. 

## 2017-05-14 ENCOUNTER — Telehealth: Payer: Self-pay | Admitting: Family Medicine

## 2017-05-14 MED ORDER — PROMETHAZINE VC/CODEINE 6.25-5-10 MG/5ML PO SYRP: 5.0000 mL | ORAL_SOLUTION | Freq: Two times a day (BID) | ORAL | 0 refills | Status: DC | PRN

## 2017-05-14 NOTE — Telephone Encounter (Signed)
done

## 2017-06-17 ENCOUNTER — Telehealth: Payer: Self-pay | Admitting: Family Medicine

## 2017-06-17 MED ORDER — PROMETHAZINE VC/CODEINE 6.25-5-10 MG/5ML PO SYRP: 5.0000 mL | ORAL_SOLUTION | Freq: Two times a day (BID) | ORAL | 0 refills | Status: DC | PRN

## 2017-06-17 NOTE — Telephone Encounter (Signed)
done

## 2017-07-02 ENCOUNTER — Ambulatory Visit (INDEPENDENT_AMBULATORY_CARE_PROVIDER_SITE_OTHER): Payer: Medicare Other | Admitting: Family Medicine

## 2017-07-02 ENCOUNTER — Encounter: Payer: Self-pay | Admitting: Family Medicine

## 2017-07-02 VITALS — BP 148/79 | HR 79 | Temp 97.6°F | Ht 62.0 in | Wt 168.0 lb

## 2017-07-02 DIAGNOSIS — L03211 Cellulitis of face: Secondary | ICD-10-CM

## 2017-07-02 MED ORDER — DOXYCYCLINE HYCLATE 100 MG PO CAPS: 100.0000 mg | ORAL_CAPSULE | Freq: Two times a day (BID) | ORAL | 0 refills | Status: AC

## 2017-07-02 NOTE — Progress Notes (Signed)
   Subjective:    Patient ID: NOTNAMED CROUCHER, female    DOB: 07/01/1946, 71 y.o.   MRN: [id number]  HPI Here for the onset last night of swelling in the upper and lower lids of the right eye. The eye itself feels okay and her vision is normal. There is no pain. No recent changes in makeup, mascara, etc. She is worried because she had a MRSA cellulitis around the other eye several years ago.    Review of Systems  Constitutional: Negative.   HENT: Positive for facial swelling. Negative for ear pain, nosebleeds, postnasal drip, sinus pain, sinus pressure and sore throat.   Eyes: Negative.   Respiratory: Negative.        Objective:   Physical Exam  Constitutional: She appears well-developed and well-nourished. No distress.  HENT:  Right Ear: External ear normal.  Left Ear: External ear normal.  Nose: Nose normal.  Mouth/Throat: Oropharynx is clear and moist.  Eyes: Pupils are equal, round, and reactive to light. Conjunctivae and EOM are normal. Right eye exhibits no discharge. Left eye exhibits no discharge.  The right upper and lower eyelids are puffy and red, not warm or tender   Neck: Neck supple. No thyromegaly present.  Pulmonary/Chest: Effort normal and breath sounds normal. No respiratory distress. She has no wheezes. She has no rales.  Lymphadenopathy:    She has no cervical adenopathy.          Assessment & Plan:  This is either an allergic reaction or an early cellulitis. We will cover for the latter with Doxycycline. Use cool compresses. Recheck prn.  Alysia Penna, MD

## 2017-07-02 NOTE — Patient Instructions (Signed)
WE NOW OFFER   [facility] FAST TRACK!!!  SAME DAY Appointments for ACUTE CARE  Such as: Sprains, Injuries, cuts, abrasions, rashes, muscle pain, joint pain, back pain Colds, flu, sore throats, headache, allergies, cough, fever  Ear pain, sinus and eye infections Abdominal pain, nausea, vomiting, diarrhea, upset stomach Animal/insect bites  3 Easy Ways to Schedule: Walk-In Scheduling Call in scheduling Mychart Sign-up: [email or web address]         

## 2017-07-03 ENCOUNTER — Telehealth: Payer: Self-pay

## 2017-07-03 DIAGNOSIS — N182 Chronic kidney disease, stage 2 (mild): Secondary | ICD-10-CM | POA: Insufficient documentation

## 2017-07-03 DIAGNOSIS — K589 Irritable bowel syndrome without diarrhea: Secondary | ICD-10-CM | POA: Insufficient documentation

## 2017-07-03 DIAGNOSIS — N179 Acute kidney failure, unspecified: Secondary | ICD-10-CM | POA: Insufficient documentation

## 2017-07-03 MED ORDER — CEPHALEXIN 500 MG PO CAPS: 500.0000 mg | ORAL_CAPSULE | Freq: Three times a day (TID) | ORAL | 0 refills | Status: DC

## 2017-07-03 NOTE — Telephone Encounter (Signed)
Spoke with pt and advised. She will start new medication today. Nothing further needed at this time.

## 2017-07-03 NOTE — Telephone Encounter (Signed)
Stop the Doxycycline and call in Keflex 500 mg tid for 10 days

## 2017-07-03 NOTE — Telephone Encounter (Signed)
Patient called to report that the doxycycline has "upset her stomach and gut". She has taken 2 doses, both with food, and has not done well. She states that the swelling has improved some. She would like to have another antibiotic to replace the doxy.   Dr. Sarajane Jews - Please advise. Thanks!

## 2017-07-16 ENCOUNTER — Telehealth: Payer: Self-pay | Admitting: Family Medicine

## 2017-07-16 MED ORDER — PROMETHAZINE VC/CODEINE 6.25-5-10 MG/5ML PO SYRP: 5.0000 mL | ORAL_SOLUTION | Freq: Two times a day (BID) | ORAL | 0 refills | Status: DC | PRN

## 2017-07-16 NOTE — Telephone Encounter (Signed)
done

## 2017-07-16 NOTE — Telephone Encounter (Signed)
Pt need new Rx for promethazine-phenyleph-Codeine   Pt is aware of 3 business days for refills and someone will call when ready for pick up.

## 2017-07-17 NOTE — Telephone Encounter (Signed)
Script is ready for pick up here at front office and I spoke with pt.  

## 2017-07-19 ENCOUNTER — Emergency Department (HOSPITAL_BASED_OUTPATIENT_CLINIC_OR_DEPARTMENT_OTHER)
Admission: EM | Admit: 2017-07-19 | Discharge: 2017-07-19 | Disposition: A | Discharge status: Home / Self Care | Payer: Medicare Other | Attending: Emergency Medicine | Admitting: Emergency Medicine

## 2017-07-19 ENCOUNTER — Encounter (HOSPITAL_BASED_OUTPATIENT_CLINIC_OR_DEPARTMENT_OTHER): Payer: Self-pay | Admitting: Emergency Medicine

## 2017-07-19 DIAGNOSIS — F17210 Nicotine dependence, cigarettes, uncomplicated: Secondary | ICD-10-CM | POA: Insufficient documentation

## 2017-07-19 DIAGNOSIS — Z79899 Other long term (current) drug therapy: Secondary | ICD-10-CM | POA: Insufficient documentation

## 2017-07-19 DIAGNOSIS — E119 Type 2 diabetes mellitus without complications: Secondary | ICD-10-CM | POA: Insufficient documentation

## 2017-07-19 DIAGNOSIS — N182 Chronic kidney disease, stage 2 (mild): Secondary | ICD-10-CM | POA: Diagnosis not present

## 2017-07-19 DIAGNOSIS — H00012 Hordeolum externum right lower eyelid: Secondary | ICD-10-CM | POA: Diagnosis not present

## 2017-07-19 DIAGNOSIS — Z7984 Long term (current) use of oral hypoglycemic drugs: Secondary | ICD-10-CM | POA: Diagnosis not present

## 2017-07-19 DIAGNOSIS — E039 Hypothyroidism, unspecified: Secondary | ICD-10-CM | POA: Insufficient documentation

## 2017-07-19 DIAGNOSIS — I129 Hypertensive chronic kidney disease with stage 1 through stage 4 chronic kidney disease, or unspecified chronic kidney disease: Secondary | ICD-10-CM | POA: Insufficient documentation

## 2017-07-19 DIAGNOSIS — H11431 Conjunctival hyperemia, right eye: Secondary | ICD-10-CM | POA: Diagnosis present

## 2017-07-19 MED ORDER — FLUORESCEIN SODIUM 0.6 MG OP STRP
1.0000 | ORAL_STRIP | Freq: Once | OPHTHALMIC | Status: AC
  Administered 2017-07-19: 1 via OPHTHALMIC
  Filled 2017-07-19: qty 1

## 2017-07-19 MED ORDER — CEPHALEXIN 500 MG PO CAPS
500.0000 mg | ORAL_CAPSULE | Freq: Four times a day (QID) | ORAL | 0 refills | Status: DC
Start: 2017-07-19 — End: 2017-08-04

## 2017-07-19 MED ORDER — ERYTHROMYCIN 5 MG/GM OP OINT: TOPICAL_OINTMENT | OPHTHALMIC | 0 refills | Status: DC

## 2017-07-19 NOTE — Discharge Instructions (Signed)
Medications: Keflex, erythromycin ointment  Treatment: Take Keflex 4 times daily for 5 days. Apply erythromycin ointment to your lower eyelid 4 times daily for 5 days. Use warm compresses 3-4 times daily.  Follow-up: Please follow-up with your primary care provider or optometrist if your symptoms are persisting. Please return the emergency Department if you develop any new or worsening symptoms including vision changes, increasing pain, redness, swelling, difficulty or pain moving your eyes.

## 2017-07-19 NOTE — ED Provider Notes (Signed)
[facility] DEPT [facility] Provider Note   CSN: [id number] Arrival date & time: 07/19/17  1216     History   Chief Complaint Chief Complaint  Patient presents with  . Eye Problem    HPI Deborah Bryant is a 71 y.o. female with history of left orbital cellulitis several years ago who presents with right eye redness and swelling. Patient reports she was treated for a stye with Keflex and warm compresses beginning on 7/18. She is evaluated by her optometrist as well as her PCP. Her symptoms resolved at the completion of antibiotics around one week ago, however she began having redness and swelling again to the same eye and lower eyelid. Patient reports she had thrown out most of her makeup, but does feel like she could have reinfected herself with something she did not throughout. She denies any significant pain or vision changes. She denies any fevers. She has not taken any other medications over-the-counter.  HPI  Past Medical History:  Diagnosis Date  . Allergic rhinitis   . Arthritis   . Cellulitis, periorbital 2006   left eye, due to MRSA, saw Dr. Erik Obey   . Cough   . Diabetes mellitus   . Hemorrhoids   . Hyperlipidemia   . Hypertension   . IBS (irritable bowel syndrome)   . Menopause   . 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 Pneumonia   . Tubular adenoma of colon 12/2006    Patient Active Problem List   Diagnosis Date Noted  . CKD (chronic kidney disease) stage 2, GFR 60-89 ml/min 07/03/2017  . IBS (irritable bowel syndrome) 07/03/2017  . Gout of multiple sites 08/01/2015  . Vitamin D deficiency 08/01/2015  . RLL pneumonia ([place]) 06/26/2015  . Sepsis ([place]) 06/26/2015  . Mixed simple and mucopurulent chronic bronchitis ([place]) 06/11/2015  . Chorioretinal scar, 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  . Nuclear cataract, bilateral 06/16/2014  . Smoker 09/07/2010  . HEMATURIA UNSPECIFIED 09/07/2010  . EDEMA- LOCALIZED 08/02/2008  . Hypothyroidism 03/29/2008    . ANEMIA 03/29/2008  . Arthropathy 01/07/2008  . Gout 10/07/2007  . Diabetes mellitus without complication ([place]) 77/41/2878  . HYPERLIPIDEMIA 07/21/2007  . Essential hypertension 07/21/2007    Past Surgical History:  Procedure Laterality Date  . APPENDECTOMY    . COLONOSCOPY  04-01-12   per Dr. Fuller Plan, tubular adenoma, repeat in 5 yrs   . POLYPECTOMY    . TUBAL LIGATION      OB History    No data available       Home Medications    Prior to Admission medications   Medication Sig Start Date End Date Taking? Authorizing Provider  allopurinol (ZYLOPRIM) 300 MG tablet Take 1 tablet (300 mg total) by mouth daily. 08/02/16   Laurey Morale, MD  Ascorbic Acid (VITAMIN C) 1000 MG tablet Take 1,000 mg by mouth daily.    [provider]  aspirin EC 81 MG tablet Take 81 mg by mouth daily.    [provider]  atorvastatin (LIPITOR) 40 MG tablet Take 1 tablet (40 mg total) by mouth daily. 08/02/16 08/02/17  Laurey Morale, MD  azithromycin (ZITHROMAX Z-PAK) 250 MG tablet As directed Patient not taking: Reported on 07/02/2017 04/09/17   Laurey Morale, MD  cephALEXin (KEFLEX) 500 MG capsule Take 1 capsule (500 mg total) by mouth 4 (four) times daily. 07/19/17   Zarria Towell, Bea Graff, PA-C  chlorpheniramine-HYDROcodone (TUSSIONEX PENNKINETIC ER) 10-8 MG/5ML SUER Take 5 mLs by  mouth every 12 (twelve) hours as needed for cough. Patient not taking: Reported on 07/02/2017 04/09/17   Laurey Morale, MD  cholecalciferol (VITAMIN D) 1000 UNITS tablet Take 2,000 Units by mouth daily.     [provider]  diazepam (VALIUM) 5 MG tablet Take 1 tablet (5 mg total) by mouth every 8 (eight) hours as needed for anxiety. For anxiety 08/02/16   Laurey Morale, MD  diclofenac (VOLTAREN) 75 MG EC tablet Take 1 tablet (75 mg total) by mouth 2 (two) times daily as needed for moderate pain. 08/02/16   Laurey Morale, MD  diltiazem (DILACOR XR) 180 MG 24 hr capsule Take 1 capsule (180 mg total) by mouth  daily. 08/02/16   Laurey Morale, MD  erythromycin ophthalmic ointment Place a 1/2 inch ribbon of ointment into the lower eyelid 4 times daily. 07/19/17   Havanah Nelms, Bea Graff, PA-C  fluticasone (FLONASE) 50 MCG/ACT nasal spray Place 2 sprays into both nostrils 2 (two) times daily as needed. For allergies 08/01/15 07/31/16  Laurey Morale, MD  glipiZIDE (GLUCOTROL XL) 5 MG 24 hr tablet Take 1 tablet (5 mg total) by mouth daily. 08/02/16 08/02/17  Laurey Morale, MD  hydrochlorothiazide (HYDRODIURIL) 25 MG tablet Take 1 tablet (25 mg total) by mouth daily. 08/02/16   Laurey Morale, MD  HYDROcodone-acetaminophen (NORCO) 10-325 MG tablet Take 1 tablet by mouth every 8 (eight) hours as needed for moderate pain. 03/17/17   Laurey Morale, MD  polyethylene glycol powder (GLYCOLAX/MIRALAX) powder Take 17 g by mouth daily.    [provider]  Promethazine-Phenyleph-Codeine (PROMETHAZINE VC/CODEINE) 6.25-5-10 MG/5ML SYRP Take 5 mLs by mouth 2 (two) times daily as needed. 07/16/17   Laurey Morale, MD  ranitidine (ZANTAC) 150 MG tablet Take 1 tablet (150 mg total) by mouth 2 (two) times daily. 08/02/16   Laurey Morale, MD  saccharomyces boulardii (FLORASTOR) 250 MG capsule Take 250 mg by mouth daily.    [provider]    Family History Family History  Problem Relation Age of Onset  . Alcohol abuse Mother   . Diabetes Mother   . Heart disease Father   . Heart disease Brother     Social History Social History  Substance Use Topics  . Smoking status: Current Every Day Smoker    Packs/day: 1.00    Years: 1.00    Types: Cigarettes  . Smokeless tobacco: Never Used  . Alcohol use 0.0 oz/week     Comment: glass of wine daily     Allergies   Losartan; Benazepril; Lasix [furosemide]; Levaquin [levofloxacin]; Phenobarbital; and Tape   Review of Systems Review of Systems  Constitutional: Negative for chills and fever.  HENT: Negative for facial swelling and sore throat.   Eyes: Positive for  redness. Negative for photophobia, pain, discharge, itching and visual disturbance.  Respiratory: Negative for shortness of breath.   Cardiovascular: Negative for chest pain.  Genitourinary: Negative for dysuria.  Musculoskeletal: Negative for back pain.  Skin: Positive for color change. Negative for rash and wound.  Psychiatric/Behavioral: The patient is not nervous/anxious.      Physical Exam Updated Vital Signs BP (!) 165/71   Pulse 61   Temp 98 F (36.7 C) (Oral)   Resp 18   Ht 5\' 2"  (1.575 m)   Wt 76.2 kg (168 lb)   SpO2 97%   BMI 30.73 kg/m   Physical Exam  Constitutional: She appears well-developed and well-nourished. No distress.  HENT:  Head: Normocephalic and atraumatic.  Mouth/Throat: Oropharynx is clear and moist. No oropharyngeal exudate.  Eyes: Pupils are equal, round, and reactive to light. Conjunctivae and EOM are normal. Right eye exhibits no discharge. Left eye exhibits no discharge. No scleral icterus.  Mild edema and tenderness to right lower eyelid with mild erythema, no hordeolum noted No pain or entrapment with EOMs No uptake on fluorescein stain   Visual Acuity  Right Eye Distance: 20/50 Left Eye Distance: 20/40 Bilateral Distance: 20/30       Neck: Normal range of motion. Neck supple. No thyromegaly present.  Cardiovascular: Normal rate, regular rhythm, normal heart sounds and intact distal pulses.  Exam reveals no gallop and no friction rub.   No murmur heard. Pulmonary/Chest: Effort normal and breath sounds normal. No stridor. No respiratory distress. She has no wheezes. She has no rales.  Musculoskeletal: She exhibits no edema.  Lymphadenopathy:    She has no cervical adenopathy.  Neurological: She is alert. Coordination normal.  Skin: Skin is warm and dry. No rash noted. She is not diaphoretic. No pallor.  Psychiatric: She has a normal mood and affect.  Nursing note and vitals reviewed.    ED Treatments / Results  Labs (all labs  ordered are listed, but only abnormal results are displayed) Labs Reviewed - No data to display  EKG  EKG Interpretation None       Radiology No results found.  Procedures Procedures (including critical care time)  Medications Ordered in ED Medications  fluorescein ophthalmic strip 1 strip (1 strip Right Eye Given by Other 07/19/17 1356)     Initial Impression / Assessment and Plan / ED Course  I have reviewed the triage vital signs and the nursing notes.  Pertinent labs & imaging results that were available during my care of the patient were reviewed by me and considered in my medical decision making (see chart for details).     Patient with probable return of hordeolum. We'll treat with erythromycin and Keflex. Warm compresses advised. No concern for orbital cellulitis. EOMs intact and without pain. No pain or discomfort to the actual eye. Patient to follow up with optometrist or PCP if symptoms continue. Return precautions discussed. Patient understands and agrees with plan. Patient vitals stable throughout ED course and discharged in satisfactory condition. I discussed patient case with Dr. Tomi Bamberger who guided the patient's management and agrees with plan.   Final Clinical Impressions(s) / ED Diagnoses   Final diagnoses:  Hordeolum of right lower eyelid, unspecified hordeolum type    New Prescriptions Discharge Medication List as of 07/19/2017  1:50 PM    START taking these medications   Details  erythromycin ophthalmic ointment Place a 1/2 inch ribbon of ointment into the lower eyelid 4 times daily., Print         Frederica Kuster, [place] 07/19/17 1528    Dorie Rank, MD 07/20/17 [phone]

## 2017-07-19 NOTE — ED Triage Notes (Signed)
Patient reports that she has a history of MRSA to her eye in the past. Today she woke up with swelling to her right eye. Recent history of treatment for infection to same eye

## 2017-07-22 LAB — HM MAMMOGRAPHY

## 2017-07-24 ENCOUNTER — Encounter: Payer: Self-pay | Admitting: Family Medicine

## 2017-08-04 ENCOUNTER — Encounter: Payer: Self-pay | Admitting: Family Medicine

## 2017-08-04 ENCOUNTER — Ambulatory Visit (INDEPENDENT_AMBULATORY_CARE_PROVIDER_SITE_OTHER): Payer: Medicare Other | Admitting: Family Medicine

## 2017-08-04 VITALS — BP 126/70 | HR 74 | Temp 97.6°F | Ht 62.0 in | Wt 165.6 lb

## 2017-08-04 DIAGNOSIS — I10 Essential (primary) hypertension: Secondary | ICD-10-CM

## 2017-08-04 DIAGNOSIS — M1000 Idiopathic gout, unspecified site: Secondary | ICD-10-CM | POA: Diagnosis not present

## 2017-08-04 DIAGNOSIS — E559 Vitamin D deficiency, unspecified: Secondary | ICD-10-CM

## 2017-08-04 DIAGNOSIS — N182 Chronic kidney disease, stage 2 (mild): Secondary | ICD-10-CM

## 2017-08-04 DIAGNOSIS — E119 Type 2 diabetes mellitus without complications: Secondary | ICD-10-CM

## 2017-08-04 DIAGNOSIS — E782 Mixed hyperlipidemia: Secondary | ICD-10-CM | POA: Diagnosis not present

## 2017-08-04 DIAGNOSIS — E039 Hypothyroidism, unspecified: Secondary | ICD-10-CM | POA: Diagnosis not present

## 2017-08-04 LAB — LIPID PANEL
CHOLESTEROL: 108 mg/dL (ref 0–200)
HDL: 59.5 mg/dL (ref >39.00)
LDL Cholesterol: 21 mg/dL (ref 0–99)
NonHDL: 48.4
TRIGLYCERIDES: 135 mg/dL (ref 0.0–149.0)
Total CHOL/HDL Ratio: 2
VLDL: 27 mg/dL (ref 0.0–40.0)

## 2017-08-04 LAB — BASIC METABOLIC PANEL
BUN: 17 mg/dL (ref 6–23)
CO2: 29 meq/L (ref 19–32)
Calcium: 9.7 mg/dL (ref 8.4–10.5)
Chloride: 102 mEq/L (ref 96–112)
Creatinine, Ser: 0.85 mg/dL (ref 0.40–1.20)
GFR: 70.08 mL/min (ref >60.00)
GLUCOSE: 112 mg/dL — AB (ref 70–99)
POTASSIUM: 4.1 meq/L (ref 3.5–5.1)
SODIUM: 140 meq/L (ref 135–145)

## 2017-08-04 LAB — VITAMIN D 25 HYDROXY (VIT D DEFICIENCY, FRACTURES): VITD: 42.15 ng/mL (ref 30.00–100.00)

## 2017-08-04 LAB — POC URINALSYSI DIPSTICK (AUTOMATED)
BILIRUBIN UA: NEGATIVE
GLUCOSE UA: NEGATIVE
KETONES UA: NEGATIVE
Leukocytes, UA: NEGATIVE
Nitrite, UA: NEGATIVE
RBC UA: NEGATIVE
SPEC GRAV UA: 1.015 (ref 1.010–1.025)
Urobilinogen, UA: 0.2 E.U./dL
pH, UA: 6 (ref 5.0–8.0)

## 2017-08-04 LAB — HEPATIC FUNCTION PANEL
ALBUMIN: 4.3 g/dL (ref 3.5–5.2)
ALK PHOS: 108 U/L (ref 39–117)
ALT: 13 U/L (ref 0–35)
AST: 16 U/L (ref 0–37)
Bilirubin, Direct: 0.1 mg/dL (ref 0.0–0.3)
TOTAL PROTEIN: 7.4 g/dL (ref 6.0–8.3)
Total Bilirubin: 0.4 mg/dL (ref 0.2–1.2)

## 2017-08-04 LAB — CBC WITH DIFFERENTIAL/PLATELET
BASOS PCT: 0.9 % (ref 0.0–3.0)
Basophils Absolute: 0.1 10*3/uL (ref 0.0–0.1)
EOS ABS: 0.1 10*3/uL (ref 0.0–0.7)
Eosinophils Relative: 0.8 % (ref 0.0–5.0)
HEMATOCRIT: 45.2 % (ref 36.0–46.0)
Hemoglobin: 14.8 g/dL (ref 12.0–15.0)
LYMPHS ABS: 1 10*3/uL (ref 0.7–4.0)
LYMPHS PCT: 9.7 % — AB (ref 12.0–46.0)
MCHC: 32.8 g/dL (ref 30.0–36.0)
MCV: 93.8 fl (ref 78.0–100.0)
MONO ABS: 0.6 10*3/uL (ref 0.1–1.0)
Monocytes Relative: 5.9 % (ref 3.0–12.0)
NEUTROS ABS: 9 10*3/uL — AB (ref 1.4–7.7)
Neutrophils Relative %: 82.7 % — ABNORMAL HIGH (ref 43.0–77.0)
PLATELETS: 325 10*3/uL (ref 150.0–400.0)
RBC: 4.83 Mil/uL (ref 3.87–5.11)
RDW: 14.1 % (ref 11.5–15.5)
WBC: 10.8 10*3/uL — ABNORMAL HIGH (ref 4.0–10.5)

## 2017-08-04 LAB — URIC ACID: Uric Acid, Serum: 4.2 mg/dL (ref 2.4–7.0)

## 2017-08-04 LAB — HEMOGLOBIN A1C: HEMOGLOBIN A1C: 6.1 % (ref 4.6–6.5)

## 2017-08-04 LAB — TSH: TSH: 2.84 u[IU]/mL (ref 0.35–4.50)

## 2017-08-04 MED ORDER — ATORVASTATIN CALCIUM 40 MG PO TABS: 40.0000 mg | ORAL_TABLET | Freq: Every day | ORAL | 3 refills | Status: DC

## 2017-08-04 MED ORDER — DIAZEPAM 5 MG PO TABS: 5.0000 mg | ORAL_TABLET | Freq: Three times a day (TID) | ORAL | 5 refills | Status: DC | PRN

## 2017-08-04 MED ORDER — RANITIDINE HCL 150 MG PO TABS: 150.0000 mg | ORAL_TABLET | Freq: Two times a day (BID) | ORAL | 3 refills | Status: DC

## 2017-08-04 MED ORDER — GLIPIZIDE ER 5 MG PO TB24: 5.0000 mg | ORAL_TABLET | Freq: Every day | ORAL | 3 refills | Status: DC

## 2017-08-04 MED ORDER — HYDROCHLOROTHIAZIDE 25 MG PO TABS: 25.0000 mg | ORAL_TABLET | Freq: Every day | ORAL | 3 refills | Status: DC

## 2017-08-04 MED ORDER — ALLOPURINOL 300 MG PO TABS: 300.0000 mg | ORAL_TABLET | Freq: Every day | ORAL | 3 refills | Status: DC

## 2017-08-04 MED ORDER — HYDROCODONE-ACETAMINOPHEN 10-325 MG PO TABS: 1.0000 | ORAL_TABLET | Freq: Three times a day (TID) | ORAL | 0 refills | Status: DC | PRN

## 2017-08-04 MED ORDER — DILTIAZEM HCL ER 180 MG PO CP24: 180.0000 mg | ORAL_CAPSULE | Freq: Every day | ORAL | 3 refills | Status: DC

## 2017-08-04 MED ORDER — DICLOFENAC SODIUM 75 MG PO TBEC
75.0000 mg | DELAYED_RELEASE_TABLET | Freq: Two times a day (BID) | ORAL | 3 refills | Status: DC | PRN
Start: 2017-08-04 — End: 2018-08-07

## 2017-08-04 NOTE — Progress Notes (Signed)
   Subjective:    Patient ID: Deborah Bryant, female    DOB: 1946/08/02, 71 y.o.   MRN: [id number]  HPI Here to follow up on issues. She has been treated for a recurrent right periorbital cellulitis with Keflex several times over the past 6 weeks, and each time she seems to respond. However she still has a tiny area of redness on the right lower eyelid that is slightly uncomfortable. Her arthritis has worsened a bit and she will see Dr. Trudie Reed in a few weeks. She has contacted [facility] to have another bone density exam soon. She is due for another colonoscopy but she has been too busy taking care of her husband, who has numerous medical problems right now. Her BP has been stable at home.    Review of Systems  Constitutional: Negative.   HENT: Negative.   Eyes: Positive for redness. Negative for photophobia, pain, discharge, itching and visual disturbance.  Respiratory: Negative.   Cardiovascular: Negative.   Gastrointestinal: Negative.   Genitourinary: Negative for decreased urine volume, difficulty urinating, dyspareunia, dysuria, enuresis, flank pain, frequency, hematuria, pelvic pain and urgency.  Musculoskeletal: Positive for arthralgias and back pain. Negative for gait problem, joint swelling, myalgias, neck pain and neck stiffness.  Skin: Negative.   Neurological: Negative.   Psychiatric/Behavioral: Negative.        Objective:   Physical Exam  Constitutional: She is oriented to person, place, and time. She appears well-developed and well-nourished. No distress.  HENT:  Head: Normocephalic and atraumatic.  Right Ear: External ear normal.  Left Ear: External ear normal.  Nose: Nose normal.  Mouth/Throat: Oropharynx is clear and moist. No oropharyngeal exudate.  Eyes: Pupils are equal, round, and reactive to light. Conjunctivae and EOM are normal. No scleral icterus.  The right lower eyelid has an tiny area of hypervascularity just at the edge, giving it a reddish color. No swelling is  seen, no styes are felt.   Neck: Normal range of motion. Neck supple. No JVD present. No thyromegaly present.  Cardiovascular: Normal rate, regular rhythm, normal heart sounds and intact distal pulses.  Exam reveals no gallop and no friction rub.   No murmur heard. Pulmonary/Chest: Effort normal and breath sounds normal. No respiratory distress. She has no wheezes. She has no rales. She exhibits no tenderness.  Abdominal: Soft. Bowel sounds are normal. She exhibits no distension and no mass. There is no tenderness. There is no rebound and no guarding.  Musculoskeletal: Normal range of motion. She exhibits no edema or tenderness.  Lymphadenopathy:    She has no cervical adenopathy.  Neurological: She is alert and oriented to person, place, and time. She has normal reflexes. No cranial nerve deficit. She exhibits normal muscle tone. Coordination normal.  Skin: Skin is warm and dry. No rash noted. No erythema.  Psychiatric: She has a normal mood and affect. Her behavior is normal. Judgment and thought content normal.          Assessment & Plan:  Her HTN is stable. Her arthritis is a problem but she will see Rheumatology soon. She will see her eye doctor this week about the irritated area on the right lower eyelid, and we will see if this is related to her recent cellulitis bouts. Get fasting labs today to check her lipids, thyroid level, etc. Get an A1c to check the diabetes.  Deborah Penna, MD

## 2017-08-12 ENCOUNTER — Telehealth: Payer: Self-pay | Admitting: Family Medicine

## 2017-08-12 MED ORDER — PROMETHAZINE VC/CODEINE 6.25-5-10 MG/5ML PO SYRP: 5.0000 mL | ORAL_SOLUTION | Freq: Two times a day (BID) | ORAL | 0 refills | Status: DC | PRN

## 2017-08-12 NOTE — Telephone Encounter (Signed)
° ° ° °  Pt request refill of the following:   Promethazine-Phenyleph-Codeine (PROMETHAZINE VC/CODEINE) 6.25-5-10 MG/5ML SYRP  Phamacy:

## 2017-08-12 NOTE — Telephone Encounter (Signed)
Script is ready for pick up here at front office and I left a message for pt.

## 2017-08-12 NOTE — Telephone Encounter (Signed)
done

## 2017-08-19 ENCOUNTER — Encounter: Payer: Self-pay | Admitting: Family Medicine

## 2017-08-28 ENCOUNTER — Telehealth: Payer: Self-pay | Admitting: Family Medicine

## 2017-08-28 NOTE — Telephone Encounter (Signed)
I tried to reach pt and no answer. I left a voice message with normal bone density scan results.

## 2017-09-04 ENCOUNTER — Encounter: Payer: Self-pay | Admitting: Family Medicine

## 2017-09-09 ENCOUNTER — Telehealth: Payer: Self-pay | Admitting: Family Medicine

## 2017-09-09 NOTE — Telephone Encounter (Signed)
Pt request refill  Promethazine-Phenyleph-Codeine (PROMETHAZINE VC/CODEINE) 6.25-5-10 MG/5ML SYRP

## 2017-09-10 MED ORDER — PROMETHAZINE VC/CODEINE 6.25-5-10 MG/5ML PO SYRP: 5.0000 mL | ORAL_SOLUTION | Freq: Two times a day (BID) | ORAL | 0 refills | Status: DC | PRN

## 2017-09-10 NOTE — Telephone Encounter (Signed)
Script is ready for pick up here at front office and I spoke with pt.  

## 2017-09-10 NOTE — Telephone Encounter (Signed)
Done

## 2017-10-20 ENCOUNTER — Telehealth: Payer: Self-pay | Admitting: Family Medicine

## 2017-10-20 NOTE — Telephone Encounter (Signed)
Pt needs new rx promethazine vc codeine

## 2017-10-21 MED ORDER — PROMETHAZINE VC/CODEINE 6.25-5-10 MG/5ML PO SYRP: 5.0000 mL | ORAL_SOLUTION | Freq: Two times a day (BID) | ORAL | 0 refills | Status: DC | PRN

## 2017-10-21 NOTE — Telephone Encounter (Signed)
Done

## 2017-10-21 NOTE — Telephone Encounter (Signed)
Script is ready for pick up here at front office and spoke with pt. 

## 2017-10-31 ENCOUNTER — Telehealth: Payer: Self-pay | Admitting: Family Medicine

## 2017-10-31 MED ORDER — HYDROCODONE-ACETAMINOPHEN 10-325 MG PO TABS: 1.0000 | ORAL_TABLET | Freq: Three times a day (TID) | ORAL | 0 refills | Status: DC | PRN

## 2017-10-31 NOTE — Telephone Encounter (Signed)
Done

## 2017-12-01 ENCOUNTER — Telehealth: Payer: Self-pay | Admitting: Family Medicine

## 2017-12-01 NOTE — Telephone Encounter (Signed)
Pt. Requesting controlled cough medicine.

## 2017-12-01 NOTE — Telephone Encounter (Signed)
Copied from [place]. Topic: Quick Communication - Rx Refill/Question >> Dec 01, 2017  9:15 AM Yvette Rack wrote: Has the patient contacted their pharmacy? No.   (Agent: If no, request that the patient contact the pharmacy for the refill.)  Promethazine-Phenyleph-Codeine (PROMETHAZINE VC/CODEINE) 6.25-5-10 MG/5ML SYRP  Preferred Pharmacy (with phone number or street name): [facility], [place] - [address] [phone] (Phone) [phone] (Fax)     Agent: Please be advised that RX refills may take up to 3 business days. We ask that you follow-up with your pharmacy.

## 2017-12-01 NOTE — Telephone Encounter (Signed)
Sent to PCP for approval. Rx was last refilled 10/21/2017.

## 2017-12-02 MED ORDER — PROMETHAZINE VC/CODEINE 6.25-5-10 MG/5ML PO SYRP: 5.0000 mL | ORAL_SOLUTION | Freq: Two times a day (BID) | ORAL | 0 refills | Status: DC | PRN

## 2017-12-02 NOTE — Telephone Encounter (Signed)
This was emailed in

## 2017-12-02 NOTE — Telephone Encounter (Signed)
Patient's husband, Gershon Mussel (on [place]), notified that rx was sent to pharmacy.

## 2018-01-02 ENCOUNTER — Telehealth: Payer: Self-pay | Admitting: Family Medicine

## 2018-01-02 ENCOUNTER — Other Ambulatory Visit: Payer: Self-pay | Admitting: Family Medicine

## 2018-01-02 MED ORDER — PROMETHAZINE VC/CODEINE 6.25-5-10 MG/5ML PO SYRP: 5.0000 mL | ORAL_SOLUTION | Freq: Two times a day (BID) | ORAL | 0 refills | Status: DC | PRN

## 2018-01-02 NOTE — Telephone Encounter (Signed)
Copied from [place] [id number]. Topic: Quick Communication - See Telephone Encounter >> Jan 02, 2018  9:28 AM Ether Griffins B wrote: CRM for notification. See Telephone encounter for:  Pt needing refill on  Promethazine-Phenyleph-Codeine  01/02/18.

## 2018-01-02 NOTE — Telephone Encounter (Signed)
Called pt and left a VM that Rx has been called into pharmacy.

## 2018-01-02 NOTE — Telephone Encounter (Signed)
Last OV 08/04/2017  Rx was last refilled 12/02/2017 disp 240 ml with no refills   Sent to PCP for approval.

## 2018-01-02 NOTE — Telephone Encounter (Signed)
Done

## 2018-01-02 NOTE — Telephone Encounter (Signed)
Last OV 08/04/2017.  Rx was last refilled 12/02/2017 disp 240 ml with no refills   Sent to PCP for approval.

## 2018-02-03 ENCOUNTER — Telehealth: Payer: Self-pay | Admitting: Family Medicine

## 2018-02-03 NOTE — Telephone Encounter (Unsigned)
Copied from [place]. Topic: General - Other >> Feb 03, 2018 12:31 PM Neva Seat wrote: Prometh-pachen-codine 240 ml  Please call pt to let her know when it's been called into pharmacy.  [facility] - [place], [place] - [address] [address] Phone: [phone] Fax: [phone]

## 2018-02-03 NOTE — Telephone Encounter (Signed)
Rx refill request for provider review:  Promethazine- Phenyleph- Codeine  6.25-5-10 mg/13ml syrp  LOV: 08/04/17  Pharmacy: verified

## 2018-02-04 MED ORDER — PROMETHAZINE VC/CODEINE 6.25-5-10 MG/5ML PO SYRP: 5.0000 mL | ORAL_SOLUTION | Freq: Two times a day (BID) | ORAL | 0 refills | Status: DC | PRN

## 2018-02-04 NOTE — Telephone Encounter (Signed)
Pt has IBS and she was taking this medication for another reason but these medications seem to help with her IBS. Pt stated that Dr. Sarajane Jews is aware of this.  Sent to PCP to advise   Send RX to [facility]

## 2018-02-04 NOTE — Telephone Encounter (Signed)
Done

## 2018-02-04 NOTE — Telephone Encounter (Signed)
Called pt and left a detailed VM that Rx has been sent into the pharmacy.

## 2018-03-09 ENCOUNTER — Telehealth: Payer: Self-pay | Admitting: Family Medicine

## 2018-03-09 NOTE — Telephone Encounter (Signed)
Last OV 08/04/2017   Last refilled 02/04/2018 disp 240 ml with no refills   Sent to PCP for approval

## 2018-03-09 NOTE — Telephone Encounter (Signed)
Copied from [place] [id number]. Topic: Quick Communication - Rx Refill/Question >> Mar 09, 2018 10:35 AM Arletha Grippe wrote: Medication: Promethazine-Phenyleph-Codeine (PROMETHAZINE VC/CODEINE Has the patient contacted their pharmacy? Yes.   (Agent: If no, request that the patient contact the pharmacy for the refill.) Preferred Pharmacy (with phone number or street name): [facility]  Agent: Please be advised that RX refills may take up to 3 business days. We ask that you follow-up with your pharmacy. Please call pt when refilling - [phone]

## 2018-03-10 ENCOUNTER — Ambulatory Visit: Payer: Medicare Other | Admitting: Family Medicine

## 2018-03-11 MED ORDER — PROMETHAZINE VC/CODEINE 6.25-5-10 MG/5ML PO SYRP: 5.0000 mL | ORAL_SOLUTION | Freq: Two times a day (BID) | ORAL | 0 refills | Status: DC | PRN

## 2018-03-11 NOTE — Telephone Encounter (Signed)
Called pt and left a VM that Rx has been sent into pharmacy.

## 2018-03-11 NOTE — Telephone Encounter (Signed)
Sent!

## 2018-03-13 ENCOUNTER — Ambulatory Visit: Payer: Medicare Other | Admitting: Family Medicine

## 2018-03-13 ENCOUNTER — Encounter: Payer: Self-pay | Admitting: Family Medicine

## 2018-03-13 VITALS — BP 140/68 | HR 61 | Temp 97.5°F | Ht 62.0 in | Wt 170.0 lb

## 2018-03-13 DIAGNOSIS — M25512 Pain in left shoulder: Secondary | ICD-10-CM

## 2018-03-13 DIAGNOSIS — E119 Type 2 diabetes mellitus without complications: Secondary | ICD-10-CM | POA: Diagnosis not present

## 2018-03-13 DIAGNOSIS — G8929 Other chronic pain: Secondary | ICD-10-CM

## 2018-03-13 DIAGNOSIS — F1190 Opioid use, unspecified, uncomplicated: Secondary | ICD-10-CM | POA: Diagnosis not present

## 2018-03-13 DIAGNOSIS — M25511 Pain in right shoulder: Secondary | ICD-10-CM | POA: Diagnosis not present

## 2018-03-13 LAB — POCT GLYCOSYLATED HEMOGLOBIN (HGB A1C): Hemoglobin A1C: 5.9

## 2018-03-13 MED ORDER — HYDROCODONE-ACETAMINOPHEN 10-325 MG PO TABS: 1.0000 | ORAL_TABLET | Freq: Four times a day (QID) | ORAL | 0 refills | Status: AC | PRN

## 2018-03-13 NOTE — Progress Notes (Signed)
   Subjective:    Patient ID: Deborah Bryant, female    DOB: 1946/05/20, 72 y.o.   MRN: [id number]  HPI Here for pain management. She is doing well.  Indication for chronic opioid: bilateral shoulder pain  Medication and dose: Norco 10-325 # pills per month: 120 Last UDS date: 03-13-18 Opioid Treatment Agreement signed (Y/N): 03-13-18 Opioid Treatment Agreement last reviewed with patient:  03-13-18 [place] reviewed this encounter (include red flags):  03-13-18    Review of Systems  Constitutional: Negative.   Respiratory: Negative.   Cardiovascular: Negative.   Musculoskeletal: Positive for arthralgias.  Neurological: Negative.        Objective:   Physical Exam  Constitutional: She is oriented to person, place, and time. She appears well-developed and well-nourished.  Cardiovascular: Normal rate, regular rhythm, normal heart sounds and intact distal pulses.  Pulmonary/Chest: Effort normal and breath sounds normal. No respiratory distress. She has no wheezes. She has no rales.  Neurological: She is alert and oriented to person, place, and time.          Assessment & Plan:  Pain management. Meds were refilled.  Alysia Penna, MD

## 2018-03-17 LAB — PAIN MGMT, PROFILE 8 W/CONF, U
6 Acetylmorphine: NEGATIVE ng/mL (ref <10)
AMPHETAMINES: NEGATIVE ng/mL (ref <500)
Alcohol Metabolites: POSITIVE ng/mL — AB (ref <500)
Alphahydroxyalprazolam: NEGATIVE ng/mL (ref <25)
Alphahydroxymidazolam: NEGATIVE ng/mL (ref <50)
Alphahydroxytriazolam: NEGATIVE ng/mL (ref <50)
Aminoclonazepam: NEGATIVE ng/mL (ref <25)
Benzodiazepines: POSITIVE ng/mL — AB (ref <100)
Buprenorphine, Urine: NEGATIVE ng/mL (ref <5)
Cocaine Metabolite: NEGATIVE ng/mL (ref <150)
Codeine: 13567 ng/mL — ABNORMAL HIGH (ref <50)
Creatinine: 100.3 mg/dL
ETHYL GLUCURONIDE (ETG): 9520 ng/mL — AB (ref <500)
ETHYL SULFATE (ETS): 2203 ng/mL — AB (ref <100)
HYDROCODONE: 959 ng/mL — AB (ref <50)
HYDROMORPHONE: 565 ng/mL — AB (ref <50)
Hydroxyethylflurazepam: NEGATIVE ng/mL (ref <50)
Lorazepam: NEGATIVE ng/mL (ref <50)
MDMA: NEGATIVE ng/mL (ref <500)
MORPHINE: 1366 ng/mL — AB (ref <50)
Marijuana Metabolite: NEGATIVE ng/mL (ref <20)
NORDIAZEPAM: 1475 ng/mL — AB (ref <50)
NORHYDROCODONE: 1164 ng/mL — AB (ref <50)
OPIATES: POSITIVE ng/mL — AB (ref <100)
Oxazepam: 2195 ng/mL — ABNORMAL HIGH (ref <50)
Oxidant: NEGATIVE ug/mL (ref <200)
Oxycodone: NEGATIVE ng/mL (ref <100)
Temazepam: 1318 ng/mL — ABNORMAL HIGH (ref <50)
pH: 6.87 (ref 4.5–9.0)

## 2018-04-13 ENCOUNTER — Telehealth: Payer: Self-pay | Admitting: Family Medicine

## 2018-04-13 NOTE — Telephone Encounter (Signed)
Sent to PCP to advise 

## 2018-04-13 NOTE — Telephone Encounter (Signed)
Pt requesting refill of Promethazine-Codeine (Phenergan with Codeine) 6.25-10mg /68ml syrup. Not on medication list currently but pt states if was last filled by Dr. Sarajane Jews on 03/11/18 and she uses with medication to help with IBS.

## 2018-04-13 NOTE — Telephone Encounter (Signed)
Copied from [place] [phone]. Topic: Quick Communication - Rx Refill/Question >> Apr 13, 2018 10:18 AM Rutherford Nail, NT wrote: Medication: promethazine-codeine (PHENERGAN WITH CODEINE) 6.25-10 MG/5ML syrup Has the patient contacted their pharmacy? Yes.   (Agent: If no, request that the patient contact the pharmacy for the refill.) Preferred Pharmacy (with phone number or street name): [place], [place] - [address]: Please be advised that RX refills may take up to 3 business days. We ask that you follow-up with your pharmacy.

## 2018-04-14 MED ORDER — PROMETHAZINE-CODEINE 6.25-10 MG/5ML PO SYRP: 5.0000 mL | ORAL_SOLUTION | Freq: Four times a day (QID) | ORAL | 0 refills | Status: DC | PRN

## 2018-04-14 NOTE — Telephone Encounter (Signed)
Called and spoke with pt. Pt advised and voiced understanding.  

## 2018-04-14 NOTE — Telephone Encounter (Signed)
This was sent in  

## 2018-04-29 ENCOUNTER — Encounter: Payer: Self-pay | Admitting: Family Medicine

## 2018-04-29 LAB — HM DIABETES EYE EXAM

## 2018-05-04 ENCOUNTER — Ambulatory Visit: Payer: Medicare Other | Admitting: Family Medicine

## 2018-05-04 ENCOUNTER — Encounter: Payer: Self-pay | Admitting: Family Medicine

## 2018-05-04 VITALS — BP 140/74 | HR 62 | Temp 98.3°F | Wt 173.0 lb

## 2018-05-04 DIAGNOSIS — S8002XA Contusion of left knee, initial encounter: Secondary | ICD-10-CM | POA: Diagnosis not present

## 2018-05-04 DIAGNOSIS — M5441 Lumbago with sciatica, right side: Secondary | ICD-10-CM

## 2018-05-04 DIAGNOSIS — S91312A Laceration without foreign body, left foot, initial encounter: Secondary | ICD-10-CM

## 2018-05-04 DIAGNOSIS — G8929 Other chronic pain: Secondary | ICD-10-CM | POA: Diagnosis not present

## 2018-05-04 DIAGNOSIS — S2002XA Contusion of left breast, initial encounter: Secondary | ICD-10-CM

## 2018-05-04 NOTE — Progress Notes (Signed)
   Subjective:    Patient ID: Deborah Bryant, female    DOB: 01/17/1946, 72 y.o.   MRN: [id number]  HPI Here for worsening low back pain which radiates down the right leg. This started several years ago but has worsened the past few months. No numbness or weakness. Using Diclofenac prn. Also she asks me to check some injuries she sustained in a fall at home a week ago. While going up some steps wearing flip flops she tripped and fell forward. She did not strike her head. She had a small laceration on the left foot and bruises on the left knee and left breast. These have all been improving since then.    Review of Systems  Constitutional: Negative.   Respiratory: Negative.   Cardiovascular: Negative.   Musculoskeletal: Positive for back pain.  Skin: Positive for wound.  Neurological: Negative.        Objective:   Physical Exam  Constitutional: She is oriented to person, place, and time. She appears well-developed and well-nourished. No distress.  Cardiovascular: Normal rate, regular rhythm, normal heart sounds and intact distal pulses.  Pulmonary/Chest: Effort normal and breath sounds normal.  Musculoskeletal:  She is tender in the lower spine and over the right sciatic notch. Full ROM   Neurological: She is alert and oriented to person, place, and time.  Skin:  There is a small laceration on the ball of the left foot at the base of the great toe. It is clean. There is some ecchymosis over the left anterior knee. No tenderness and full ROM. There is ecchymosis over the left breast, no tenderness.           Assessment & Plan:  She fell and sustained a small laceration to the left foot. Dress with Neosporin daily. She also has bruising over the left knee and left breast. These areas are resolving. She has low back pain with radicular symptoms down the right leg. Set up a lumbar MRI soon.  Alysia Penna, MD

## 2018-05-05 ENCOUNTER — Other Ambulatory Visit: Payer: Self-pay | Admitting: Family Medicine

## 2018-05-14 ENCOUNTER — Other Ambulatory Visit: Payer: Self-pay | Admitting: Family Medicine

## 2018-05-14 ENCOUNTER — Ambulatory Visit
Admission: RE | Admit: 2018-05-14 | Discharge: 2018-05-14 | Disposition: A | Discharge status: Home / Self Care | Payer: Medicare Other | Source: Ambulatory Visit | Attending: Family Medicine | Admitting: Family Medicine

## 2018-05-14 DIAGNOSIS — M5441 Lumbago with sciatica, right side: Principal | ICD-10-CM

## 2018-05-14 DIAGNOSIS — G8929 Other chronic pain: Secondary | ICD-10-CM

## 2018-05-14 NOTE — Telephone Encounter (Unsigned)
Copied from [place] [id number]. Topic: Quick Communication - Rx Refill/Question >> May 14, 2018  3:04 PM Percell Belt A wrote: Medication: promethazine-codeine (PHENERGAN WITH CODEINE) 6.25-10 MG/5ML syrup [[id number]]  Has the patient contacted their pharmacy?no (Agent: If no, request that the patient contact the pharmacy for the refill.) (Agent: If yes, when and what did the pharmacy advise?)  Preferred Pharmacy (with phone number or street name): [facility]: Please be advised that RX refills may take up to 3 business days. We ask that you follow-up with your pharmacy.

## 2018-05-15 NOTE — Telephone Encounter (Signed)
Sent to PCP to advise 

## 2018-05-15 NOTE — Telephone Encounter (Signed)
Phenergan with Codeine syrup refill Last Refill:04/14/18 29mLs Last OV: 05/04/18 PCP: Dr. Sarajane Jews Pharmacy:[facility]

## 2018-05-15 NOTE — Addendum Note (Signed)
Addended by: Alysia Penna A on: 05/15/2018 05:30 PM   Modules accepted: Orders

## 2018-05-18 ENCOUNTER — Telehealth: Payer: Self-pay | Admitting: Family Medicine

## 2018-05-18 MED ORDER — PROMETHAZINE-CODEINE 6.25-10 MG/5ML PO SYRP: 5.0000 mL | ORAL_SOLUTION | Freq: Four times a day (QID) | ORAL | 0 refills | Status: DC | PRN

## 2018-05-18 NOTE — Telephone Encounter (Signed)
Sent to PCP for approval.  

## 2018-05-18 NOTE — Telephone Encounter (Signed)
Done

## 2018-05-18 NOTE — Telephone Encounter (Signed)
Pt asking about Phenergan with Codiene refill. Pt states that she has talked to Dr Sarajane Jews about this and she takes it for her IBS once a month.

## 2018-05-18 NOTE — Telephone Encounter (Signed)
Copied from [place] [id number]. Topic: General - Other >> May 18, 2018 10:22 AM Carolyn Stare wrote:  Pt had MRI 05/14/18 and she has read it on my chart  and is asking what is the next step   [phone]

## 2018-05-18 NOTE — Telephone Encounter (Signed)
The results were:   No fractures seen but she has severe degenerative discs and a lot of arthritis changes. She very likely has some pinched nerves as well. I think she should she a Neurosurgeon to evaluate her further. Her husband sees Dr. Earnie Larsson so I will refer her to see him  Next steps would to be Neurosurgeon   Called and spoke with pt. Dr. Sarajane Jews has already done the referral. Pt advised and voiced understanding.

## 2018-06-09 ENCOUNTER — Encounter: Payer: Self-pay | Admitting: Family Medicine

## 2018-06-09 ENCOUNTER — Ambulatory Visit: Payer: Medicare Other | Admitting: Family Medicine

## 2018-06-09 VITALS — BP 148/64 | HR 69 | Temp 97.8°F | Ht 62.0 in | Wt 174.2 lb

## 2018-06-09 DIAGNOSIS — M25511 Pain in right shoulder: Secondary | ICD-10-CM | POA: Diagnosis not present

## 2018-06-09 DIAGNOSIS — M545 Low back pain, unspecified: Secondary | ICD-10-CM | POA: Insufficient documentation

## 2018-06-09 DIAGNOSIS — G8929 Other chronic pain: Secondary | ICD-10-CM

## 2018-06-09 DIAGNOSIS — F1190 Opioid use, unspecified, uncomplicated: Secondary | ICD-10-CM

## 2018-06-09 DIAGNOSIS — M25512 Pain in left shoulder: Secondary | ICD-10-CM | POA: Diagnosis not present

## 2018-06-09 MED ORDER — HYDROCODONE-ACETAMINOPHEN 10-325 MG PO TABS: 1.0000 | ORAL_TABLET | Freq: Four times a day (QID) | ORAL | 0 refills | Status: AC | PRN

## 2018-06-09 MED ORDER — PROMETHAZINE-CODEINE 6.25-10 MG/5ML PO SYRP: 5.0000 mL | ORAL_SOLUTION | Freq: Four times a day (QID) | ORAL | 0 refills | Status: DC | PRN

## 2018-06-09 MED ORDER — HYDROCODONE-ACETAMINOPHEN 10-325 MG PO TABS: 1.0000 | ORAL_TABLET | Freq: Four times a day (QID) | ORAL | 0 refills | Status: DC | PRN

## 2018-06-09 NOTE — Progress Notes (Signed)
   Subjective:    Patient ID: Deborah Bryant, female    DOB: 1946/03/01, 72 y.o.   MRN: [id number]  HPI Here for pain management. She is doing well.  Indication for chronic opioid: low back pain and shoulder pain  Medication and dose: Norco 10-325 # pills per month: 120 Last UDS date: 03-13-18 Last opoid contract signed:  03-13-18  Opioid Treatment Agreement last reviewed with patient:  06-09-18 [place] reviewed this encounter (include red flags):  06-09-18      Review of Systems  Constitutional: Negative.   Respiratory: Negative.   Cardiovascular: Negative.   Musculoskeletal: Positive for arthralgias.  Neurological: Negative.        Objective:   Physical Exam  Constitutional: She is oriented to person, place, and time. She appears well-developed and well-nourished.  Cardiovascular: Normal rate, regular rhythm, normal heart sounds and intact distal pulses.  Pulmonary/Chest: Effort normal and breath sounds normal.  Neurological: She is alert and oriented to person, place, and time.          Assessment & Plan:  Pain management, meds are refilled.  Alysia Penna, MD

## 2018-07-13 ENCOUNTER — Other Ambulatory Visit: Payer: Self-pay | Admitting: Family Medicine

## 2018-07-13 NOTE — Telephone Encounter (Signed)
Copied from [place] [id number]. Topic: Quick Communication - Rx Refill/Question >> Jul 13, 2018  1:02 PM Percell Belt A wrote: Medication: promethazine-codeine (PHENERGAN WITH CODEINE) 6.25-10 MG/5ML syrup [[id number]]   Has the patient contacted their pharmacy? No  (Agent: If no, request that the patient contact the pharmacy for the refill.) (Agent: If yes, when and what did the pharmacy advise?)  Preferred Pharmacy (with phone number or street name): [facility].  She stated date on bottle last filled 6/25 , use for IBS   Agent: Please be advised that RX refills may take up to 3 business days. We ask that you follow-up with your pharmacy.

## 2018-07-14 NOTE — Telephone Encounter (Signed)
Phenergan with Codeine 6.25-10 mg/40ml syrup refill request  LOV 06/09/18 with Dr. Sarajane Jews.  Uses for IBS  LR:  06/09/18  Has upcoming appt with Dr. Sarajane Jews 08/07/18.  [place], [place]

## 2018-07-14 NOTE — Telephone Encounter (Signed)
Sent to PCP to advise 

## 2018-07-17 MED ORDER — PROMETHAZINE-CODEINE 6.25-10 MG/5ML PO SYRP: 5.0000 mL | ORAL_SOLUTION | Freq: Four times a day (QID) | ORAL | 0 refills | Status: DC | PRN

## 2018-07-17 NOTE — Telephone Encounter (Signed)
Done

## 2018-07-24 ENCOUNTER — Encounter: Payer: Self-pay | Admitting: Family Medicine

## 2018-07-25 LAB — HM MAMMOGRAPHY

## 2018-08-07 ENCOUNTER — Ambulatory Visit (INDEPENDENT_AMBULATORY_CARE_PROVIDER_SITE_OTHER): Payer: Medicare Other | Admitting: Family Medicine

## 2018-08-07 ENCOUNTER — Encounter: Payer: Self-pay | Admitting: Family Medicine

## 2018-08-07 VITALS — BP 170/88 | HR 65 | Temp 97.6°F | Ht 61.75 in | Wt 168.6 lb

## 2018-08-07 DIAGNOSIS — M1000 Idiopathic gout, unspecified site: Secondary | ICD-10-CM

## 2018-08-07 DIAGNOSIS — I10 Essential (primary) hypertension: Secondary | ICD-10-CM | POA: Diagnosis not present

## 2018-08-07 DIAGNOSIS — K589 Irritable bowel syndrome without diarrhea: Secondary | ICD-10-CM | POA: Diagnosis not present

## 2018-08-07 DIAGNOSIS — G8929 Other chronic pain: Secondary | ICD-10-CM

## 2018-08-07 DIAGNOSIS — E119 Type 2 diabetes mellitus without complications: Secondary | ICD-10-CM

## 2018-08-07 DIAGNOSIS — M545 Low back pain: Secondary | ICD-10-CM

## 2018-08-07 DIAGNOSIS — E039 Hypothyroidism, unspecified: Secondary | ICD-10-CM

## 2018-08-07 DIAGNOSIS — N182 Chronic kidney disease, stage 2 (mild): Secondary | ICD-10-CM

## 2018-08-07 DIAGNOSIS — E782 Mixed hyperlipidemia: Secondary | ICD-10-CM

## 2018-08-07 LAB — BASIC METABOLIC PANEL
BUN: 17 mg/dL (ref 6–23)
CALCIUM: 10.1 mg/dL (ref 8.4–10.5)
CHLORIDE: 99 meq/L (ref 96–112)
CO2: 30 meq/L (ref 19–32)
CREATININE: 0.88 mg/dL (ref 0.40–1.20)
GFR: 67.14 mL/min (ref >60.00)
GLUCOSE: 108 mg/dL — AB (ref 70–99)
Potassium: 4.1 mEq/L (ref 3.5–5.1)
SODIUM: 137 meq/L (ref 135–145)

## 2018-08-07 LAB — CBC WITH DIFFERENTIAL/PLATELET
BASOS ABS: 0.1 10*3/uL (ref 0.0–0.1)
BASOS PCT: 1.2 % (ref 0.0–3.0)
EOS PCT: 0.5 % (ref 0.0–5.0)
Eosinophils Absolute: 0.1 10*3/uL (ref 0.0–0.7)
HCT: 43.2 % (ref 36.0–46.0)
Hemoglobin: 14.6 g/dL (ref 12.0–15.0)
LYMPHS ABS: 1.6 10*3/uL (ref 0.7–4.0)
LYMPHS PCT: 14.4 % (ref 12.0–46.0)
MCHC: 33.9 g/dL (ref 30.0–36.0)
MCV: 91.5 fl (ref 78.0–100.0)
MONO ABS: 1 10*3/uL (ref 0.1–1.0)
Monocytes Relative: 9.2 % (ref 3.0–12.0)
NEUTROS PCT: 74.7 % (ref 43.0–77.0)
Neutro Abs: 8.1 10*3/uL — ABNORMAL HIGH (ref 1.4–7.7)
Platelets: 356 10*3/uL (ref 150.0–400.0)
RBC: 4.72 Mil/uL (ref 3.87–5.11)
RDW: 14.2 % (ref 11.5–15.5)
WBC: 10.8 10*3/uL — AB (ref 4.0–10.5)

## 2018-08-07 LAB — LIPID PANEL
Cholesterol: 126 mg/dL (ref 0–200)
HDL: 72.7 mg/dL (ref >39.00)
LDL Cholesterol: 31 mg/dL (ref 0–99)
NonHDL: 53.28
TRIGLYCERIDES: 111 mg/dL (ref 0.0–149.0)
Total CHOL/HDL Ratio: 2
VLDL: 22.2 mg/dL (ref 0.0–40.0)

## 2018-08-07 LAB — HEPATIC FUNCTION PANEL
ALK PHOS: 112 U/L (ref 39–117)
ALT: 13 U/L (ref 0–35)
AST: 12 U/L (ref 0–37)
Albumin: 4.5 g/dL (ref 3.5–5.2)
BILIRUBIN DIRECT: 0.2 mg/dL (ref 0.0–0.3)
BILIRUBIN TOTAL: 0.7 mg/dL (ref 0.2–1.2)
TOTAL PROTEIN: 7.1 g/dL (ref 6.0–8.3)

## 2018-08-07 LAB — POC URINALSYSI DIPSTICK (AUTOMATED)
BILIRUBIN UA: NEGATIVE
Blood, UA: NEGATIVE
GLUCOSE UA: NEGATIVE
KETONES UA: NEGATIVE
LEUKOCYTES UA: NEGATIVE
Nitrite, UA: NEGATIVE
PROTEIN UA: POSITIVE — AB
SPEC GRAV UA: 1.015 (ref 1.010–1.025)
Urobilinogen, UA: 0.2 E.U./dL
pH, UA: 7 (ref 5.0–8.0)

## 2018-08-07 LAB — HEMOGLOBIN A1C: Hgb A1c MFr Bld: 6.2 % (ref 4.6–6.5)

## 2018-08-07 LAB — TSH: TSH: 2.61 u[IU]/mL (ref 0.35–4.50)

## 2018-08-07 LAB — URIC ACID: Uric Acid, Serum: 4 mg/dL (ref 2.4–7.0)

## 2018-08-07 MED ORDER — PROMETHAZINE-CODEINE 6.25-10 MG/5ML PO SYRP: 5.0000 mL | ORAL_SOLUTION | Freq: Four times a day (QID) | ORAL | 0 refills | Status: DC | PRN

## 2018-08-07 MED ORDER — RANITIDINE HCL 150 MG PO TABS: 150.0000 mg | ORAL_TABLET | Freq: Two times a day (BID) | ORAL | 3 refills | Status: DC

## 2018-08-07 MED ORDER — DIAZEPAM 5 MG PO TABS: 5.0000 mg | ORAL_TABLET | Freq: Three times a day (TID) | ORAL | 5 refills | Status: DC | PRN

## 2018-08-07 MED ORDER — DILTIAZEM HCL ER COATED BEADS 240 MG PO CP24: 240.0000 mg | ORAL_CAPSULE | Freq: Every day | ORAL | 3 refills | Status: DC

## 2018-08-07 MED ORDER — DICLOFENAC SODIUM 75 MG PO TBEC: 75.0000 mg | DELAYED_RELEASE_TABLET | Freq: Two times a day (BID) | ORAL | 3 refills | Status: DC | PRN

## 2018-08-07 MED ORDER — ALLOPURINOL 300 MG PO TABS: 300.0000 mg | ORAL_TABLET | Freq: Every day | ORAL | 3 refills | Status: DC

## 2018-08-07 MED ORDER — ATORVASTATIN CALCIUM 40 MG PO TABS: 40.0000 mg | ORAL_TABLET | Freq: Every day | ORAL | 3 refills | Status: DC

## 2018-08-07 MED ORDER — HYDROCHLOROTHIAZIDE 25 MG PO TABS: 25.0000 mg | ORAL_TABLET | Freq: Every day | ORAL | 3 refills | Status: DC

## 2018-08-07 NOTE — Progress Notes (Signed)
   Subjective:    Patient ID: Deborah Bryant, female    DOB: [birth year], 72 y.o.   MRN: [id number]  HPI Here to follow up om issues. Her main problem continues to be her low back pain . She had her first ESI per Dr. Brien Few on 07-28-18, and she is scheduled for another one on 08-18-18. It is likely she will need surgery at some point. Her BP has been trending upwards for several months, averaging 859Y to 924M systolic. Her GERD is stable. Her IBS is stable. She had a diabetic eye exam in May.    Review of Systems  Constitutional: Negative.   HENT: Negative.   Eyes: Negative.   Respiratory: Negative.   Cardiovascular: Negative.   Gastrointestinal: Negative.   Genitourinary: Negative for decreased urine volume, difficulty urinating, dyspareunia, dysuria, enuresis, flank pain, frequency, hematuria, pelvic pain and urgency.  Musculoskeletal: Positive for back pain.  Skin: Negative.   Neurological: Negative.   Psychiatric/Behavioral: Negative.        Objective:   Physical Exam  Constitutional: She is oriented to person, place, and time. She appears well-developed and well-nourished. No distress.  HENT:  Head: Normocephalic and atraumatic.  Right Ear: External ear normal.  Left Ear: External ear normal.  Nose: Nose normal.  Mouth/Throat: Oropharynx is clear and moist. No oropharyngeal exudate.  Eyes: Pupils are equal, round, and reactive to light. Conjunctivae and EOM are normal. No scleral icterus.  Neck: Normal range of motion. Neck supple. No JVD present. No thyromegaly present.  Cardiovascular: Normal rate, regular rhythm, normal heart sounds and intact distal pulses. Exam reveals no gallop and no friction rub.  No murmur heard. Pulmonary/Chest: Effort normal and breath sounds normal. No respiratory distress. She has no wheezes. She has no rales. She exhibits no tenderness.  Abdominal: Soft. Bowel sounds are normal. She exhibits no distension and no mass. There is no tenderness. There is  no rebound and no guarding.  Musculoskeletal: Normal range of motion. She exhibits no edema or tenderness.  Lymphadenopathy:    She has no cervical adenopathy.  Neurological: She is alert and oriented to person, place, and time. She has normal reflexes. She displays normal reflexes. No cranial nerve deficit. She exhibits normal muscle tone. Coordination normal.  Skin: Skin is warm and dry. No rash noted. No erythema.  Psychiatric: She has a normal mood and affect. Her behavior is normal. Judgment and thought content normal.          Assessment & Plan:  For her HTN we will increased Diltiazem to 240 mg daily and continue the HCTZ. Get fasting labs today to check lipids, TSH, renal function, etc. Check the diabetes with an A1c. She will follow up with Dr. Annette Stable and Dr. Brien Few for the back pain. She is a year past due for her colonoscopy, but we agreed to postpone this for awhile until her back pain is better controlled.  Alysia Penna, MD

## 2018-08-27 ENCOUNTER — Other Ambulatory Visit: Payer: Self-pay | Admitting: Neurosurgery

## 2018-09-07 ENCOUNTER — Ambulatory Visit: Payer: Medicare Other | Attending: Neurosurgery | Admitting: Physical Therapy

## 2018-09-07 ENCOUNTER — Other Ambulatory Visit: Payer: Self-pay

## 2018-09-07 ENCOUNTER — Encounter: Payer: Self-pay | Admitting: Physical Therapy

## 2018-09-07 DIAGNOSIS — R2689 Other abnormalities of gait and mobility: Secondary | ICD-10-CM | POA: Insufficient documentation

## 2018-09-07 DIAGNOSIS — M5441 Lumbago with sciatica, right side: Secondary | ICD-10-CM | POA: Diagnosis present

## 2018-09-07 DIAGNOSIS — R262 Difficulty in walking, not elsewhere classified: Secondary | ICD-10-CM | POA: Insufficient documentation

## 2018-09-07 DIAGNOSIS — M6281 Muscle weakness (generalized): Secondary | ICD-10-CM | POA: Insufficient documentation

## 2018-09-07 DIAGNOSIS — M5442 Lumbago with sciatica, left side: Secondary | ICD-10-CM | POA: Insufficient documentation

## 2018-09-07 NOTE — Therapy (Signed)
[facility] [address]  [place], [place], [address] Phone: [phone]   Fax:  [phone]  Physical Therapy Evaluation  Patient Details  Name: Deborah Bryant MRN: [id number] Date of Birth: [birth year] Referring Provider: Mallie Mussel A. Pool, MD   Encounter Date: 09/07/2018  PT End of Session - 09/07/18 1621    Visit Number  1    Number of Visits  8    Date for PT Re-Evaluation  10/09/18    Authorization Type  [employer]    PT Start Time  1528    PT Stop Time  1619    PT Time Calculation (min)  51 min    Activity Tolerance  Patient tolerated treatment well    Behavior During Therapy  WFL for tasks assessed/performed       Past Medical History:  Diagnosis Date  . Allergic rhinitis   . Arthritis    sees Dr. Gavin Pound   . Cellulitis, periorbital 2006   left eye, due to MRSA, saw Dr. Erik Obey   . Cough   . Diabetes mellitus   . Hemorrhoids   . Hyperlipidemia   . Hypertension   . IBS (irritable bowel syndrome)   . Menopause   . 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 Pneumonia   . Tubular adenoma of colon 12/2006    Past Surgical History:  Procedure Laterality Date  . APPENDECTOMY    . COLONOSCOPY  04-01-12   per Dr. Fuller Plan, tubular adenoma, repeat in 5 yrs   . POLYPECTOMY    . TUBAL LIGATION      There were no vitals filed for this visit.   Subjective Assessment - 09/07/18 1531    Subjective  Pt reports she has been told her L4 has shifted off L5 and states the neurosurgeon wants to fuse L4/5 but insurance wants her to try PT first. Also reports arthritis in low back along with bone spurs. Has received 2 ESI with only very brief releif. Feels like her pain has changed the way she walks and has impacted her balance.    Pertinent History  osteoarthritis & rheumatoid arthritis    Limitations  Sitting;Walking;House hold activities;Lifting    How long can you sit  comfortably?  10-20 minutes if still (not twisting/turning)    How long can you stand comfortably?  10 minutes (also impacted by arthritis in feet)     How long can you walk comfortably?  up to 30 minutes    Diagnostic tests  Lumbar MRI 05/14/18: Extensive multilevel degenerative change throughout the lumbar spine. Mild levoscoliosis. Mild spinal stenosis L3-4 with moderate right foraminal encroachment. 6 mm anterolisthesis L4-5. Severe facet degeneration causing moderate spinal stenosis and moderate subarticular and foraminal stenosis bilaterally. Severe disc degeneration L5-S1. Moderate right foraminal encroachment and severe left foraminal encroachment due to spurring.    Patient Stated Goals  "some relief from the pain"    Currently in Pain?  Yes    Pain Score  7    6-7/10; least 5/10, avg 6-7/10, worst 10/10   Pain Location  Back    Pain Orientation  Lower   R>L   Pain Descriptors / Indicators  Dull;Aching    Pain Type  Acute pain    Pain Radiating Towards  intermittent burning/stabbing or "electric shock" in buttocks down back of legs to knees L>R    Pain Onset  More than a month ago  May 2019   Pain Frequency  Constant   varies in intensity   Aggravating Factors   twisting & reaching; prolonged walking    Pain Relieving Factors  heating pad; [place]    Effect of Pain on Daily Activities  limits positional tolerance in all positions; limits walking tolerance and alters gait pattern         [facility] PT Assessment - 09/07/18 1528      Assessment   Medical Diagnosis  Lumbar stenosis with neurogenic claudication    Referring Provider  Mallie Mussel A. Pool, MD    Onset Date/Surgical Date  --   May 2019   Next MD Visit  currently scheduled for L4/5 PLIF on 09/29/18, but subject to change due to possibility of husband needing gall bladder surgery      Precautions   Precautions  Back      Balance Screen   Has the patient fallen in the past 6 months  No    Has the patient had a decrease in  activity level because of a fear of falling?   No    Is the patient reluctant to leave their home because of a fear of falling?   No      Home Environment   Living Environment  Private residence    Living Arrangements  Spouse/significant other    Type of [employer] to enter    Entrance Stairs-Number of Steps  threshold    Home Layout  One level    [place] - 2 wheels;Kasandra Knudsen - single point   belongs to husband     Prior Function   Level of Independence  Independent    Vocation  Retired    Leisure  gym with husband - still accompanies husband but unable to workout      ROM / Strength   AROM / PROM / Strength  AROM;Strength      AROM   AROM Assessment Site  Lumbar    Lumbar Flexion  hands to ankles    Lumbar Extension  50% limited    Lumbar - Right Side Bend  hands to lateral joint line of knee    Lumbar - Left Side Bend  hands to lateral joint line of knee    Lumbar - Right Rotation  40% limited    Lumbar - Left Rotation  40% limited      Strength   Strength Assessment Site  Hip;Knee;Ankle    Right/Left Hip  Right;Left    Right Hip Flexion  4-/5    Right Hip External Rotation   4-/5    Right Hip Internal Rotation  4/5    Right Hip ABduction  4-/5    Right Hip ADduction  4/5    Left Hip Flexion  4-/5    Left Hip External Rotation  4-/5    Left Hip Internal Rotation  4/5    Left Hip ABduction  4-/5    Left Hip ADduction  4-/5    Right/Left Knee  Right;Left    Right Knee Flexion  4/5    Right Knee Extension  4+/5    Left Knee Flexion  4/5    Left Knee Extension  4+/5    Right/Left Ankle  Right;Left    Right Ankle Dorsiflexion  4/5    Left Ankle Dorsiflexion  4/5      Flexibility   Soft Tissue Assessment /Muscle Length  yes  Hamstrings  mild tight L>R    ITB  mild tight R>L    Piriformis  mild tight B      Palpation   Palpation comment  increased tension in B ITB with ttp over L greater trochanter      Ambulation/Gait    Ambulation/Gait Assistance  5: Supervision    Assistive device  None    Gait Pattern  Wide base of support;Lateral trunk lean to right;Lateral trunk lean to left;Decreased trunk rotation;Step-through pattern                Objective measurements completed on examination: See above findings.      [facility] Adult PT Treatment/Exercise - 09/07/18 1528      Bed Mobility   Bed Mobility  Rolling Right;Right Sidelying to Sit;Sit to Sidelying Right    Rolling Right  Supervision/verbal cueing    Right Sidelying to Sit  Supervision/Verbal cueing    Sit to Sidelying Right  Supervision/Verbal cueing      Self-Care   Self-Care  Posture    Posture  Educated pt on proper sleeping postures as well as bed mobility supine to/from sit via log rolling and sidelying to/from sit. Pt provided with educational handout for postural awareness with typical daily tasks & will plan for further explanation on next visit.             PT Education - 09/07/18 1619    Education Details  PT eval findings, anticipated POC and initial postural education    Person(s) Educated  Patient    Methods  Explanation;Demonstration;Handout;Verbal cues    Comprehension  Verbalized understanding;Returned demonstration;Verbal cues required;Need further instruction          PT Long Term Goals - 09/07/18 1619      PT LONG TERM GOAL #1   Title  Patient will verbalize/demonstrate understanding of neutral spine posture and proper body mechanics to reduce strain on lumbar spine    Status  New    Target Date  10/09/18      PT LONG TERM GOAL #2   Title  Patient to be independent with ongoing HEP     Status  New    Target Date  10/09/18      PT LONG TERM GOAL #3   Title  Patient to report low back pain reduction in frequency and intensity by >/= 25%     Status  New    Target Date  10/09/18      PT LONG TERM GOAL #4   Title  B LE strength >/= 4 to 4+/5 for improved stability    Status  New    Target Date   10/09/18      PT LONG TERM GOAL #5   Title  Pt will ambulate with normal gait pattern w/o increased LBP    Status  New    Target Date  10/09/18             Plan - 09/07/18 1619    Clinical Impression Statement  Deborah Bryant is a 72 y/o female who presents to OP PT for lumbar stenosis with neurogenic claudication. Recent MRI reveals extensive multilevel degenerative change throughout the lumbar spine including 6 mm anterolisthesis L4-5, mild spinal stenosis L3-4 with moderate right foraminal encroachment, severe facet degeneration causing moderate spinal stenosis and moderate subarticular and foraminal stenosis bilaterally, severe disc degeneration L5-S1, moderate right foraminal encroachment and severe left foraminal encroachment due to spurring as well as mild levoscoliosis. Pt is currently  scheduled for L4/5 PLIF on 09/29/18, but may have to postpone as her husband may have to have more emergent gall bladder surgery. Pt feels that her back pain was most likely triggered by having to help her husband recover from low back surgery recently. Patient today with moderate to severe LBP with intermittent B LE sciatica. Mild limitations present in AROM of lumbar spine, with mild to moderate tightness/limited flexibility present in B hamstring musculature, R IT band and B piriformis. Mild decreased LE strength noted L slightly > R. Pain limits positional tolerance in sitting and standing, as well as walking tolerance and causes pt to alter her gait pattern and feel more unbalanced. Initiated discussion of proper posture and body mechanics to reduce back strain with handout provided and will further elaborate on this next visit. Patient to benefit from skilled PT to address the above listed deficits to maximize function with decreased pain interference.    History and Personal Factors relevant to plan of care:  Extensive multilevel degenerative change throughout the lumbar spine including 6 mm anterolisthesis  L4-5, mild spinal stenosis L3-4 with moderate right foraminal encroachment, severe facet degeneration causing moderate spinal stenosis and moderate subarticular and foraminal stenosis bilaterally, severe disc degeneration L5-S1, moderate right foraminal encroachment and severe left foraminal encroachment due to spurring as well as mild levoscoliosis.     Clinical Presentation  Evolving    Clinical Presentation due to:  worsening LBP since onset in May 2019 with extensive degenerative changes as above + osteoarthritis & rheumatoid arthritis affecting multiple joints    Clinical Decision Making  Moderate    Rehab Potential  Fair    PT Frequency  2x / week    PT Duration  4 weeks    PT Treatment/Interventions  Patient/family education;Neuromuscular re-education;ADLs/Self Care Home Management;Therapeutic exercise;Therapeutic activities;Functional mobility training;Gait training;Balance training;Manual techniques;Dry needling;Passive range of motion;Taping;Electrical Stimulation;Moist Heat;Cryotherapy;Traction;Ultrasound;Iontophoresis 4mg /ml Dexamethasone    Consulted and Agree with Plan of Care  Patient       Patient will benefit from skilled therapeutic intervention in order to improve the following deficits and impairments:  Pain, Postural dysfunction, Improper body mechanics, Impaired flexibility, Decreased range of motion, Decreased strength, Decreased activity tolerance, Difficulty walking, Abnormal gait, Decreased balance, Increased muscle spasms  Visit Diagnosis: Acute bilateral low back pain with bilateral sciatica  Muscle weakness (generalized)  Difficulty in walking, not elsewhere classified  Other abnormalities of gait and mobility     Problem List Patient Active Problem List   Diagnosis Date Noted  . Low back pain 06/09/2018  . Shoulder pain, bilateral 03/13/2018  . CKD (chronic kidney disease) stage 2, GFR 60-89 ml/min 07/03/2017  . IBS (irritable bowel syndrome) 07/03/2017   . Gout of multiple sites 08/01/2015  . Vitamin D deficiency 08/01/2015  . RLL pneumonia ([place]) 06/26/2015  . Sepsis ([place]) 06/26/2015  . Mixed simple and mucopurulent chronic bronchitis ([place]) 06/11/2015  . Chorioretinal scar, 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  . Nuclear cataract, bilateral 06/16/2014  . Smoker 09/07/2010  . HEMATURIA UNSPECIFIED 09/07/2010  . EDEMA- LOCALIZED 08/02/2008  . Hypothyroidism 03/29/2008  . ANEMIA 03/29/2008  . Arthropathy 01/07/2008  . Gout 10/07/2007  . Diabetes mellitus without complication ([place]) 95/62/1308  . Hyperlipemia 07/21/2007  . Essential hypertension 07/21/2007    Percival Spanish, PT, MPT 09/07/2018, 6:59 PM  [facility] [address]  [place], [place], [address] Phone: [phone]   Fax:  [phone]  Name: Deborah Bryant MRN: [id number] Date  of Birth: 1946-04-07

## 2018-09-07 NOTE — Patient Instructions (Signed)

## 2018-09-09 ENCOUNTER — Ambulatory Visit (INDEPENDENT_AMBULATORY_CARE_PROVIDER_SITE_OTHER): Payer: Medicare Other | Admitting: Family Medicine

## 2018-09-09 ENCOUNTER — Encounter: Payer: Self-pay | Admitting: Family Medicine

## 2018-09-09 VITALS — BP 134/70 | HR 64 | Temp 97.8°F | Wt 169.1 lb

## 2018-09-09 DIAGNOSIS — G8929 Other chronic pain: Secondary | ICD-10-CM

## 2018-09-09 DIAGNOSIS — M545 Low back pain: Secondary | ICD-10-CM

## 2018-09-09 DIAGNOSIS — F1190 Opioid use, unspecified, uncomplicated: Secondary | ICD-10-CM

## 2018-09-09 DIAGNOSIS — R05 Cough: Secondary | ICD-10-CM

## 2018-09-09 DIAGNOSIS — R053 Chronic cough: Secondary | ICD-10-CM | POA: Insufficient documentation

## 2018-09-09 MED ORDER — PROMETHAZINE-CODEINE 6.25-10 MG/5ML PO SYRP: 5.0000 mL | ORAL_SOLUTION | Freq: Four times a day (QID) | ORAL | 0 refills | Status: DC | PRN

## 2018-09-09 MED ORDER — HYDROCODONE-ACETAMINOPHEN 10-325 MG PO TABS: 1.0000 | ORAL_TABLET | Freq: Four times a day (QID) | ORAL | 0 refills | Status: DC | PRN

## 2018-09-09 NOTE — Progress Notes (Signed)
   Subjective:    Patient ID: Deborah Bryant, female    DOB: [birth year], 72 y.o.   MRN: [id number]  HPI Here for pain management and cough management. She is doing well.  Indication for chronic opioid: low back pain and chronic cough  Medication and dose: Norco 10-325 and Promethazine-codeine 6.25-10 mg/5 ml  # pills per month: 120 and 240 ml  Last UDS date: 03-13-18 Opioid Treatment Agreement signed (Y/N): 03-13-18 Opioid Treatment Agreement last reviewed with patient:  09-09-18 [place] reviewed this encounter (include red flags):  09-09-18    Review of Systems  Constitutional: Negative.   Respiratory: Positive for cough.   Cardiovascular: Negative.   Musculoskeletal: Positive for back pain.  Neurological: Negative.        Objective:   Physical Exam  Constitutional: She is oriented to person, place, and time. She appears well-developed and well-nourished.  Cardiovascular: Normal rate, regular rhythm, normal heart sounds and intact distal pulses.  Pulmonary/Chest: Effort normal and breath sounds normal.  Neurological: She is alert and oriented to person, place, and time.          Assessment & Plan:  Pain and cough management, meds are refilled.  Alysia Penna, MD

## 2018-09-15 ENCOUNTER — Ambulatory Visit: Payer: Medicare Other | Attending: Neurosurgery

## 2018-09-15 DIAGNOSIS — R262 Difficulty in walking, not elsewhere classified: Secondary | ICD-10-CM

## 2018-09-15 DIAGNOSIS — M5441 Lumbago with sciatica, right side: Secondary | ICD-10-CM | POA: Insufficient documentation

## 2018-09-15 DIAGNOSIS — R2689 Other abnormalities of gait and mobility: Secondary | ICD-10-CM | POA: Diagnosis present

## 2018-09-15 DIAGNOSIS — M6281 Muscle weakness (generalized): Secondary | ICD-10-CM | POA: Diagnosis present

## 2018-09-15 DIAGNOSIS — M5442 Lumbago with sciatica, left side: Secondary | ICD-10-CM | POA: Diagnosis present

## 2018-09-15 NOTE — Therapy (Signed)
[facility] [address]  [place], [place], [address] Phone: [phone]   Fax:  [phone]  Physical Therapy Treatment  Patient Details  Name: Deborah Bryant MRN: [id number] Date of Birth: 02-Apr-1946 Referring Provider (PT): Cooper Render. Pool, MD   Encounter Date: 09/15/2018  PT End of Session - 09/15/18 1323    Visit Number  2    Number of Visits  8    Date for PT Re-Evaluation  10/09/18    Authorization Type  [employer]    PT Start Time  1316    PT Stop Time  1358    PT Time Calculation (min)  42 min    Activity Tolerance  Patient tolerated treatment well    Behavior During Therapy  WFL for tasks assessed/performed       Past Medical History:  Diagnosis Date  . Allergic rhinitis   . Arthritis    sees Dr. Gavin Pound   . Cellulitis, periorbital 2006   left eye, due to MRSA, saw Dr. Erik Obey   . Cough   . Diabetes mellitus   . Hemorrhoids   . Hyperlipidemia   . Hypertension   . IBS (irritable bowel syndrome)   . Menopause   . 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 Pneumonia   . Tubular adenoma of colon 12/2006    Past Surgical History:  Procedure Laterality Date  . APPENDECTOMY    . COLONOSCOPY  04-01-12   per Dr. Fuller Plan, tubular adenoma, repeat in 5 yrs   . POLYPECTOMY    . TUBAL LIGATION      There were no vitals filed for this visit.  Subjective Assessment - 09/15/18 1320    Subjective  Pt. doing well today with no issues with HEP.      Diagnostic tests  Lumbar MRI 05/14/18: Extensive multilevel degenerative change throughout the lumbar spine. Mild levoscoliosis. Mild spinal stenosis L3-4 with moderate right foraminal encroachment. 6 mm anterolisthesis L4-5. Severe facet degeneration causing moderate spinal stenosis and moderate subarticular and foraminal stenosis bilaterally. Severe disc degeneration L5-S1. Moderate right foraminal encroachment and severe left  foraminal encroachment due to spurring.    Patient Stated Goals  "some relief from the pain"    Currently in Pain?  Yes    Pain Score  5     Pain Location  Back    Pain Orientation  Lower    Pain Descriptors / Indicators  Dull;Aching    Pain Type  Acute pain    Pain Radiating Towards  intermittent burning/stabbing or "electric shock" in buttocks down back of legs to knees L>R     Pain Onset  More than a month ago    Pain Frequency  Constant    Aggravating Factors   twisting + reaching; prolonged walking     Pain Relieving Factors  Prolonged sitting, heating pad     Multiple Pain Sites  No                       [facility] Adult PT Treatment/Exercise - 09/15/18 1333      Self-Care   Self-Care  Other Self-Care Comments    Posture  Further discussed proper body mechanics with household tasks as to reduce lumbar strain    Other Self-Care Comments   Discussed possibility of ending therapy after next visit as this is patient preference due to upcoming surgery and being  busy       Lumbar Exercises: Supine   Ab Set  10 reps;3 seconds    AB Set Limitations  hooklying       Knee/Hip Exercises: Stretches   Passive Hamstring Stretch  Left;1 rep;30 seconds    Passive Hamstring Stretch Limitations  strap     ITB Stretch  Right;1 rep;30 seconds    ITB Stretch Limitations  strap     Piriformis Stretch  Right;Left;1 rep;30 seconds    Piriformis Stretch Limitations  KTOS              PT Education - 09/15/18 1827    Education Details  HEP update    Person(s) Educated  Patient    Methods  Explanation;Demonstration;Verbal cues;Handout    Comprehension  Verbalized understanding;Returned demonstration;Verbal cues required;Need further instruction          PT Long Term Goals - 09/15/18 1323      PT LONG TERM GOAL #1   Title  Patient will verbalize/demonstrate understanding of neutral spine posture and proper body mechanics to reduce strain on lumbar spine    Status   On-going      PT LONG TERM GOAL #2   Title  Patient to be independent with ongoing HEP     Status  On-going      PT LONG TERM GOAL #3   Title  Patient to report low back pain reduction in frequency and intensity by >/= 25%     Status  On-going      PT LONG TERM GOAL #4   Title  B LE strength >/= 4 to 4+/5 for improved stability    Status  On-going      PT LONG TERM GOAL #5   Title  Pt will ambulate with normal gait pattern w/o increased LBP    Status  On-going            Plan - 09/15/18 1324    Clinical Impression Statement  Deborah Bryant doing well today.  Notes she has been more conscious of her body mechanics with household tasks as to reduce lumbar strain.  Tolerated all LE stretching and basic lumbopelvic strengthening well today.  HEP created and issued to pt.  Pt. noting she wishes to end therapy after next session due to being busy preparing for upcoming surgery on 10.15.  Supervising PT approving this plan.  Will monitor response to updated HEP at next visit.       PT Treatment/Interventions  Patient/family education;Neuromuscular re-education;ADLs/Self Care Home Management;Therapeutic exercise;Therapeutic activities;Functional mobility training;Gait training;Balance training;Manual techniques;Dry needling;Passive range of motion;Taping;Electrical Stimulation;Moist Heat;Cryotherapy;Traction;Ultrasound;Iontophoresis 4mg /ml Dexamethasone    Consulted and Agree with Plan of Care  Patient       Patient will benefit from skilled therapeutic intervention in order to improve the following deficits and impairments:  Pain, Postural dysfunction, Improper body mechanics, Impaired flexibility, Decreased range of motion, Decreased strength, Decreased activity tolerance, Difficulty walking, Abnormal gait, Decreased balance, Increased muscle spasms  Visit Diagnosis: Acute bilateral low back pain with bilateral sciatica  Muscle weakness (generalized)  Difficulty in walking, not elsewhere  classified  Other abnormalities of gait and mobility     Problem List Patient Active Problem List   Diagnosis Date Noted  . Chronic cough 09/09/2018  . Low back pain 06/09/2018  . Shoulder pain, bilateral 03/13/2018  . CKD (chronic kidney disease) stage 2, GFR 60-89 ml/min 07/03/2017  . IBS (irritable bowel syndrome) 07/03/2017  . Gout of multiple sites 08/01/2015  .  Vitamin D deficiency 08/01/2015  . RLL pneumonia ([place]) 06/26/2015  . Sepsis ([place]) 06/26/2015  . Mixed simple and mucopurulent chronic bronchitis ([place]) 06/11/2015  . Chorioretinal scar, 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  . Nuclear cataract, bilateral 06/16/2014  . Smoker 09/07/2010  . HEMATURIA UNSPECIFIED 09/07/2010  . EDEMA- LOCALIZED 08/02/2008  . Hypothyroidism 03/29/2008  . ANEMIA 03/29/2008  . Arthropathy 01/07/2008  . Gout 10/07/2007  . Diabetes mellitus without complication ([place]) 00/97/9499  . Hyperlipemia 07/21/2007  . Essential hypertension 07/21/2007    Bess Harvest, PTA 09/15/18 6:36 PM   [facility] [address]  [place], [place], [address] Phone: [phone]   Fax:  [phone]  Name: Deborah Bryant MRN: [id number] Date of Birth: 1946-09-08

## 2018-09-17 ENCOUNTER — Ambulatory Visit: Payer: Medicare Other

## 2018-09-17 DIAGNOSIS — M5442 Lumbago with sciatica, left side: Secondary | ICD-10-CM | POA: Diagnosis not present

## 2018-09-17 DIAGNOSIS — R2689 Other abnormalities of gait and mobility: Secondary | ICD-10-CM

## 2018-09-17 DIAGNOSIS — M5441 Lumbago with sciatica, right side: Secondary | ICD-10-CM

## 2018-09-17 DIAGNOSIS — R262 Difficulty in walking, not elsewhere classified: Secondary | ICD-10-CM

## 2018-09-17 DIAGNOSIS — M6281 Muscle weakness (generalized): Secondary | ICD-10-CM

## 2018-09-17 NOTE — Therapy (Addendum)
[facility] [address]  [place], [place], [address] Phone: [phone]   Fax:  [phone]  Physical Therapy Treatment  Patient Details  Name: Deborah Bryant MRN: [id number] Date of Birth: 1946-04-23 Referring Provider (PT): Cooper Render. Pool, MD   Encounter Date: 09/17/2018  PT End of Session - 09/17/18 1457    Visit Number  3    Number of Visits  8    Date for PT Re-Evaluation  10/09/18    Authorization Type  [employer]    PT Start Time  1447    PT Stop Time  1529    PT Time Calculation (min)  42 min    Activity Tolerance  Patient tolerated treatment well    Behavior During Therapy  WFL for tasks assessed/performed       Past Medical History:  Diagnosis Date  . Allergic rhinitis   . Arthritis    sees Dr. Gavin Pound   . Cellulitis, periorbital 2006   left eye, due to MRSA, saw Dr. Erik Obey   . Cough   . Diabetes mellitus   . Hemorrhoids   . Hyperlipidemia   . Hypertension   . IBS (irritable bowel syndrome)   . Menopause   . 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 Pneumonia   . Tubular adenoma of colon 12/2006    Past Surgical History:  Procedure Laterality Date  . APPENDECTOMY    . COLONOSCOPY  04-01-12   per Dr. Fuller Plan, tubular adenoma, repeat in 5 yrs   . POLYPECTOMY    . TUBAL LIGATION      There were no vitals filed for this visit.  Subjective Assessment - 09/17/18 1452    Subjective  Pt. reporting some apprehension regarding mobility following surgery.  Pt. noting ~ 20% improvement in overall pain levels since starting therapy.      Pertinent History  osteoarthritis & rheumatoid arthritis    Diagnostic tests  Lumbar MRI 05/14/18: Extensive multilevel degenerative change throughout the lumbar spine. Mild levoscoliosis. Mild spinal stenosis L3-4 with moderate right foraminal encroachment. 6 mm anterolisthesis L4-5. Severe facet degeneration causing moderate  spinal stenosis and moderate subarticular and foraminal stenosis bilaterally. Severe disc degeneration L5-S1. Moderate right foraminal encroachment and severe left foraminal encroachment due to spurring.    Patient Stated Goals  "some relief from the pain"    Currently in Pain?  Yes    Pain Score  8     Pain Location  Back    Pain Orientation  Lower    Pain Descriptors / Indicators  --   "stiffness"   Pain Type  Acute pain    Pain Radiating Towards  Intermittent burning/stabbing or "electric shock" down L LE with twisting     Pain Onset  More than a month ago    Pain Frequency  Constant    Aggravating Factors   bending and twisting at same time    Multiple Pain Sites  No                       [facility] Adult PT Treatment/Exercise - 09/17/18 1501      Self-Care   Self-Care  Other Self-Care Comments    Other Self-Care Comments   comprehensive review of home program with update for additional LE stretching and glute set       Lumbar Exercises: Stretches   Passive Hamstring Stretch  Right;Left;1 rep;30 seconds    Passive Hamstring Stretch Limitations  with strap     Single Knee to Chest Stretch  Right;Left;30 seconds;1 rep    Single Knee to Chest Stretch Limitations  B    Piriformis Stretch  Right;Left;1 rep;30 seconds    Piriformis Stretch Limitations  KTOS       Lumbar Exercises: Aerobic   Nustep  Lvl 4, 5 min       Lumbar Exercises: Supine   Ab Set  15 reps;3 seconds    AB Set Limitations  hooklying     Glut Set  15 reps;5 seconds    Glut Set Limitations  Cues provided for proper glute activation              PT Education - 09/17/18 1736    Education Details  HEP update     Person(s) Educated  Patient    Methods  Explanation;Demonstration;Verbal cues;Handout    Comprehension  Verbalized understanding;Returned demonstration;Verbal cues required;Need further instruction          PT Long Term Goals - 09/17/18 1510      PT LONG TERM GOAL #1   Title   Patient will verbalize/demonstrate understanding of neutral spine posture and proper body mechanics to reduce strain on lumbar spine    Status  Achieved      PT LONG TERM GOAL #2   Title  Patient to be independent with ongoing HEP     Status  Achieved      PT LONG TERM GOAL #3   Title  Patient to report low back pain reduction in frequency and intensity by >/= 25%     Status  Partially Met      PT LONG TERM GOAL #4   Title  B LE strength >/= 4 to 4+/5 for improved stability    Status  Unable to assess      PT LONG TERM GOAL #5   Title  Pt will ambulate with normal gait pattern w/o increased LBP    Status  Partially Met   no increased LBP            Plan - 09/17/18 1458    Clinical Impression Statement  Pt. noting ~ 20% improvement in overall pain levels since beginning therapy noting some relief from LE stretches.  Wishes to make today her last day of therapy as she will be busy before scheduled back surgery on 10.15.19.  Supervising PT approving this thus comprehensive HEP reviewed with pt. today and updated.  Pt. noting she feels strength MMT testing may flare-up back pain and requesting not to perform this today.  Has partially met or achieved all other goals in therapy and unable to address LE strength goal pt. pt. request.  Pt. now d/c from therapy.      PT Treatment/Interventions  Patient/family education;Neuromuscular re-education;ADLs/Self Care Home Management;Therapeutic exercise;Therapeutic activities;Functional mobility training;Gait training;Balance training;Manual techniques;Dry needling;Passive range of motion;Taping;Electrical Stimulation;Moist Heat;Cryotherapy;Traction;Ultrasound;Iontophoresis 22m/ml Dexamethasone    Consulted and Agree with Plan of Care  Patient       Patient will benefit from skilled therapeutic intervention in order to improve the following deficits and impairments:  Pain, Postural dysfunction, Improper body mechanics, Impaired flexibility,  Decreased range of motion, Decreased strength, Decreased activity tolerance, Difficulty walking, Abnormal gait, Decreased balance, Increased muscle spasms  Visit Diagnosis: Acute bilateral low back pain with bilateral sciatica  Muscle weakness (generalized)  Difficulty in walking, not elsewhere classified  Other abnormalities of gait and mobility  Problem List Patient Active Problem List   Diagnosis Date Noted  . Chronic cough 09/09/2018  . Low back pain 06/09/2018  . Shoulder pain, bilateral 03/13/2018  . CKD (chronic kidney disease) stage 2, GFR 60-89 ml/min 07/03/2017  . IBS (irritable bowel syndrome) 07/03/2017  . Gout of multiple sites 08/01/2015  . Vitamin D deficiency 08/01/2015  . RLL pneumonia ([place]) 06/26/2015  . Sepsis ([place]) 06/26/2015  . Mixed simple and mucopurulent chronic bronchitis ([place]) 06/11/2015  . Chorioretinal scar, 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  . Nuclear cataract, bilateral 06/16/2014  . Smoker 09/07/2010  . HEMATURIA UNSPECIFIED 09/07/2010  . EDEMA- LOCALIZED 08/02/2008  . Hypothyroidism 03/29/2008  . ANEMIA 03/29/2008  . Arthropathy 01/07/2008  . Gout 10/07/2007  . Diabetes mellitus without complication ([place]) 00/71/2197  . Hyperlipemia 07/21/2007  . Essential hypertension 07/21/2007    Bess Harvest, PTA 09/17/18 5:46 PM      [facility] [address]  [place], [place], [address] Phone: [phone]   Fax:  [phone]  Name: Deborah Bryant MRN: [id number] Date of Birth: 1946-05-24   PHYSICAL THERAPY DISCHARGE SUMMARY  Visits from Start of Care: 3  Current functional level related to goals / functional outcomes:   Refer to above clinical impression.   Remaining deficits:   As above.   Education / Equipment:   HEP  Plan: Patient agrees to discharge.  Patient goals were partially met. Patient is being discharged due to the patient's request.  ?????     Percival Spanish, PT,  MPT 09/18/18, 12:50 PM  [facility] [address]  [place], [place], [address] Phone: [phone]   Fax:  [phone]

## 2018-09-22 ENCOUNTER — Encounter (HOSPITAL_COMMUNITY): Payer: Self-pay

## 2018-09-22 ENCOUNTER — Encounter (HOSPITAL_COMMUNITY)
Admission: RE | Admit: 2018-09-22 | Discharge: 2018-09-22 | Disposition: A | Discharge status: Home / Self Care | Payer: Medicare Other | Source: Ambulatory Visit | Attending: Neurosurgery | Admitting: Neurosurgery

## 2018-09-22 ENCOUNTER — Other Ambulatory Visit: Payer: Self-pay

## 2018-09-22 DIAGNOSIS — Z01818 Encounter for other preprocedural examination: Secondary | ICD-10-CM | POA: Diagnosis present

## 2018-09-22 HISTORY — DX: Chronic kidney disease, unspecified: N18.9

## 2018-09-22 HISTORY — DX: Gastro-esophageal reflux disease without esophagitis: K21.9

## 2018-09-22 HISTORY — DX: Chronic obstructive pulmonary disease, unspecified: J44.9

## 2018-09-22 HISTORY — DX: Adverse effect of unspecified anesthetic, initial encounter: T41.45XA

## 2018-09-22 HISTORY — DX: Nausea with vomiting, unspecified: R11.2

## 2018-09-22 HISTORY — DX: Anxiety disorder, unspecified: F41.9

## 2018-09-22 HISTORY — DX: Dyspnea, unspecified: R06.00

## 2018-09-22 HISTORY — DX: Other specified postprocedural states: Z98.890

## 2018-09-22 HISTORY — DX: Other complications of anesthesia, initial encounter: T88.59XA

## 2018-09-22 LAB — BASIC METABOLIC PANEL
ANION GAP: 9 (ref 5–15)
BUN: 13 mg/dL (ref 8–23)
CHLORIDE: 101 mmol/L (ref 98–111)
CO2: 28 mmol/L (ref 22–32)
Calcium: 9.9 mg/dL (ref 8.9–10.3)
Creatinine, Ser: 1.1 mg/dL — ABNORMAL HIGH (ref 0.44–1.00)
GFR calc Af Amer: 57 mL/min — ABNORMAL LOW (ref >60)
GFR calc non Af Amer: 49 mL/min — ABNORMAL LOW (ref >60)
Glucose, Bld: 99 mg/dL (ref 70–99)
Potassium: 3.7 mmol/L (ref 3.5–5.1)
Sodium: 138 mmol/L (ref 135–145)

## 2018-09-22 LAB — SURGICAL PCR SCREEN
MRSA, PCR: NEGATIVE
Staphylococcus aureus: NEGATIVE

## 2018-09-22 LAB — TYPE AND SCREEN
ABO/RH(D): O POS
ANTIBODY SCREEN: NEGATIVE

## 2018-09-22 LAB — CBC WITH DIFFERENTIAL/PLATELET
Abs Immature Granulocytes: 0.05 10*3/uL (ref 0.00–0.07)
Basophils Absolute: 0.1 10*3/uL (ref 0.0–0.1)
Basophils Relative: 1 %
Eosinophils Absolute: 0.1 10*3/uL (ref 0.0–0.5)
Eosinophils Relative: 1 %
HEMATOCRIT: 44 % (ref 36.0–46.0)
HEMOGLOBIN: 14.4 g/dL (ref 12.0–15.0)
IMMATURE GRANULOCYTES: 1 %
LYMPHS ABS: 1.4 10*3/uL (ref 0.7–4.0)
LYMPHS PCT: 14 %
MCH: 30.5 pg (ref 26.0–34.0)
MCHC: 32.7 g/dL (ref 30.0–36.0)
MCV: 93.2 fL (ref 80.0–100.0)
Monocytes Absolute: 0.7 10*3/uL (ref 0.1–1.0)
Monocytes Relative: 7 %
NEUTROS PCT: 76 %
Neutro Abs: 7.9 10*3/uL — ABNORMAL HIGH (ref 1.7–7.7)
Platelets: 325 10*3/uL (ref 150–400)
RBC: 4.72 MIL/uL (ref 3.87–5.11)
RDW: 13.5 % (ref 11.5–15.5)
WBC: 10.2 10*3/uL (ref 4.0–10.5)
nRBC: 0 % (ref 0.0–0.2)

## 2018-09-22 LAB — GLUCOSE, CAPILLARY: Glucose-Capillary: 184 mg/dL — ABNORMAL HIGH (ref 70–99)

## 2018-09-22 LAB — ABO/RH: ABO/RH(D): O POS

## 2018-09-22 NOTE — Progress Notes (Addendum)
PCP  Alysia Penna  MD  See Dr. Dion Body once a year just to keep check on kidneys   Does not check blood sugars @ home

## 2018-09-22 NOTE — Pre-Procedure Instructions (Signed)
Deborah Bryant  09/22/2018      [facility] - [place], [place] [address] [place] [address] Phone: [phone] Fax: [phone]  [facility] [place] [place] - [address] [place] [address] [place] [address] Phone: [phone] Fax: [phone]    Your procedure is scheduled on 09-29-2018  Tuesday   Report to [facility] Admitting at 8:30 A.M.   Call this number if you have problems the morning of surgery:  [phone]   Remember:  Do not eat food or drink liquids after midnight.                         Take these medicines the morning of surgery with A SIP OF WATER   Allopurinol(Zyloprim) Atorvastatin(Lipitor) Diazepam(Valium) Diltiazem CD Hydrocodone-acetaminophen(Norco) if needed Hydroxychlorquine(Plaquenil) Tobradex eye drops   STOP TAKING ANY ASPIRIN (UNLESS OTHERWISE INSTRUCTED BY YOUR SURGEON),ANTIINFLAMATORIES (IBUPROFEN,ALEVE,MOTRIN,ADVIL,GOODY'S POWDERS),HERBAL SUPPLEMENTS,FISH OIL,AND VITAMINS 5-7 DAYS PRIOR TO SURGERY Stop Voltaren      How to Manage Your Diabetes Before and After Surgery  Why is it important to control my blood sugar before and after surgery? . Improving blood sugar levels before and after surgery helps healing and can limit problems. . A way of improving blood sugar control is eating a healthy diet by: o  Eating less sugar and carbohydrates o  Increasing activity/exercise o  Talking with your doctor about reaching your blood sugar goals . High blood sugars (greater than 180 mg/dL) can raise your risk of infections and slow your recovery, so you will need to focus on controlling your diabetes during the weeks before surgery. . Make sure that the doctor who takes care of your diabetes knows about your planned surgery including the date and location.  How do I manage my blood sugar before surgery? . Check your blood sugar at least  4 times a day, starting 2 days before surgery, to make sure that the level is not too high or low. o Check your blood sugar the morning of your surgery when you wake up and every 2 hours until you get to the Short Stay unit. . If your blood sugar is less than 70 mg/dL, you will need to treat for low blood sugar: o Do not take insulin. o Treat a low blood sugar (less than 70 mg/dL) with  cup of clear juice (cranberry or apple), 4 glucose tablets, OR glucose gel. Recheck blood sugar in 15 minutes after treatment (to make sure it is greater than 70 mg/dL). If your blood sugar is not greater than 70 mg/dL on recheck, call [phone] o  for further instructions. . Report your blood sugar to the short stay nurse when you get to Short Stay.  . If you are admitted to the hospital after surgery: o Your blood sugar will be checked by the staff and you will probably be given insulin after surgery (instead of oral diabetes medicines) to make sure you have good blood sugar levels. o The goal for blood sugar control after surgery is 80-180 mg/dL.              WHAT DO I DO ABOUT MY DIABETES MEDICATION?   Mar[place] Kitchen Do not take oral diabetes medicines (pills) the morning of surgery. . Glipizide(Glucotrol XL) .      .   . The day of surgery, do not take other diabetes injectables, including Byetta (  exenatide), Bydureon (exenatide ER), Victoza (liraglutide), or Trulicity (dulaglutide). ,  Other Instructions:          :  Date:  :  Date:   Reviewed and Endorsed by [facility] Patient Education Committee, August 2015       Do not wear jewelry, make-up or nail polish.  Do not wear lotions, powders, or perfumes, or deodorant.  Do not shave 48 hours prior to surgery.  Men may shave face and neck.  Do not bring valuables to the hospital.  [facility] is not responsible for any belongings or valuables.  Contacts, dentures or bridgework may not be worn into surgery.  Leave your suitcase in the  car.  After surgery it may be brought to your room.  For patients admitted to the hospital, discharge time will be determined by your treatment team.  Patients discharged the day of surgery will not be allowed to drive home.     [place] - Preparing for Surgery  Before surgery, you can play an important role.  Because skin is not sterile, your skin needs to be as free of germs as possible.  You can reduce the number of germs on you skin by washing with CHG (chlorahexidine gluconate) soap before surgery.  CHG is an antiseptic cleaner which kills germs and bonds with the skin to continue killing germs even after washing.  Oral Hygiene is also important in reducing the risk of infection.  Remember to brush your teeth with your regular toothpaste the morning of surgery.  Please DO NOT use if you have an allergy to CHG or antibacterial soaps.  If your skin becomes reddened/irritated stop using the CHG and inform your nurse when you arrive at Short Stay.  Do not shave (including legs and underarms) for at least 48 hours prior to the first CHG shower.  You may shave your face.  Please follow these instructions carefully:   1.  Shower with CHG Soap the night before surgery and the morning of Surgery.  2.  If you choose to wash your hair, wash your hair first as usual with your normal shampoo.  3.  After you shampoo, rinse your hair and body thoroughly to remove the shampoo. 4.  Use CHG as you would any other liquid soap.  You can apply chg directly to the skin and wash gently with a      scrungie or washcloth.           5.  Apply the CHG Soap to your body ONLY FROM THE NECK DOWN.   Do not use on open wounds or open sores. Avoid contact with your eyes, ears, mouth and genitals (private parts).  Wash genitals (private parts) with your normal soap.  6.  Wash thoroughly, paying special attention to the area where your surgery will be performed.  7.  Thoroughly rinse your body with warm water from the  neck down.  8.  DO NOT shower/wash with your normal soap after using and rinsing off the CHG Soap.  9.  Pat yourself dry with a clean towel.            10.  Wear clean pajamas.            11.  Place clean sheets on your bed the night of your first shower and do not sleep with pets.  Day of Surgery  Do not apply any lotions/deoderants the morning of surgery.   Please wear clean clothes to the hospital/surgery  center. Remember to brush your teeth with toothpaste.    Please read over the following fact sheets that you were given. Pain Booklet and Surgical Site Infection Prevention

## 2018-09-27 ENCOUNTER — Other Ambulatory Visit: Payer: Self-pay

## 2018-09-27 ENCOUNTER — Encounter (HOSPITAL_BASED_OUTPATIENT_CLINIC_OR_DEPARTMENT_OTHER): Payer: Self-pay | Admitting: Emergency Medicine

## 2018-09-27 ENCOUNTER — Emergency Department (HOSPITAL_BASED_OUTPATIENT_CLINIC_OR_DEPARTMENT_OTHER): Payer: Medicare Other

## 2018-09-27 ENCOUNTER — Emergency Department (HOSPITAL_BASED_OUTPATIENT_CLINIC_OR_DEPARTMENT_OTHER)
Admission: EM | Admit: 2018-09-27 | Discharge: 2018-09-27 | Disposition: A | Discharge status: Home / Self Care | Payer: Medicare Other | Attending: Emergency Medicine | Admitting: Emergency Medicine

## 2018-09-27 DIAGNOSIS — N182 Chronic kidney disease, stage 2 (mild): Secondary | ICD-10-CM | POA: Diagnosis not present

## 2018-09-27 DIAGNOSIS — E1122 Type 2 diabetes mellitus with diabetic chronic kidney disease: Secondary | ICD-10-CM | POA: Diagnosis not present

## 2018-09-27 DIAGNOSIS — I129 Hypertensive chronic kidney disease with stage 1 through stage 4 chronic kidney disease, or unspecified chronic kidney disease: Secondary | ICD-10-CM | POA: Diagnosis not present

## 2018-09-27 DIAGNOSIS — Z79899 Other long term (current) drug therapy: Secondary | ICD-10-CM | POA: Diagnosis not present

## 2018-09-27 DIAGNOSIS — J029 Acute pharyngitis, unspecified: Secondary | ICD-10-CM | POA: Insufficient documentation

## 2018-09-27 DIAGNOSIS — Z7984 Long term (current) use of oral hypoglycemic drugs: Secondary | ICD-10-CM | POA: Diagnosis not present

## 2018-09-27 DIAGNOSIS — Z7982 Long term (current) use of aspirin: Secondary | ICD-10-CM | POA: Insufficient documentation

## 2018-09-27 DIAGNOSIS — F17210 Nicotine dependence, cigarettes, uncomplicated: Secondary | ICD-10-CM | POA: Insufficient documentation

## 2018-09-27 DIAGNOSIS — E039 Hypothyroidism, unspecified: Secondary | ICD-10-CM | POA: Diagnosis not present

## 2018-09-27 DIAGNOSIS — J449 Chronic obstructive pulmonary disease, unspecified: Secondary | ICD-10-CM | POA: Insufficient documentation

## 2018-09-27 LAB — GROUP A STREP BY PCR: Group A Strep by PCR: NOT DETECTED

## 2018-09-27 NOTE — ED Provider Notes (Signed)
[facility] EMERGENCY DEPARTMENT Provider Note   CSN: [id number] Arrival date & time: 09/27/18  1013     History   Chief Complaint Chief Complaint  Patient presents with  . Sore Throat    HPI Deborah Bryant is a 72 y.o. female.  The history is provided by the patient.  Sore Throat  This is a new problem. The current episode started yesterday. The problem occurs constantly. The problem has not changed since onset.Pertinent negatives include no chest pain, no abdominal pain, no headaches and no shortness of breath. Nothing aggravates the symptoms. Nothing relieves the symptoms. She has tried acetaminophen for the symptoms. The treatment provided moderate relief.    Past Medical History:  Diagnosis Date  . Allergic rhinitis   . Anxiety   . Arthritis    sees Dr. Gavin Pound   . Cellulitis, periorbital 2006   left eye, due to MRSA, saw Dr. Erik Obey   . Chronic kidney disease   . Complication of anesthesia   . COPD (chronic obstructive pulmonary disease) ([place])   . Cough   . Diabetes mellitus   . Dyspnea    with exertion  . GERD (gastroesophageal reflux disease)    OTC  Pepcid  . Hemorrhoids   . Hyperlipidemia   . Hypertension   . IBS (irritable bowel syndrome)   . Menopause   . 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 Pneumonia   . PONV (postoperative nausea and vomiting)   . Tubular adenoma of colon 12/2006    Patient Active Problem List   Diagnosis Date Noted  . Chronic cough 09/09/2018  . Low back pain 06/09/2018  . Shoulder pain, bilateral 03/13/2018  . CKD (chronic kidney disease) stage 2, GFR 60-89 ml/min 07/03/2017  . IBS (irritable bowel syndrome) 07/03/2017  . Gout of multiple sites 08/01/2015  . Vitamin D deficiency 08/01/2015  . RLL pneumonia ([place]) 06/26/2015  . Sepsis ([place]) 06/26/2015  . Mixed simple and mucopurulent chronic bronchitis ([place]) 06/11/2015  . Chorioretinal scar, 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  . Nuclear  cataract, bilateral 06/16/2014  . Smoker 09/07/2010  . HEMATURIA UNSPECIFIED 09/07/2010  . EDEMA- LOCALIZED 08/02/2008  . Hypothyroidism 03/29/2008  . ANEMIA 03/29/2008  . Arthropathy 01/07/2008  . Gout 10/07/2007  . Diabetes mellitus without complication ([place]) 58/52/7782  . Hyperlipemia 07/21/2007  . Essential hypertension 07/21/2007    Past Surgical History:  Procedure Laterality Date  . APPENDECTOMY    . COLONOSCOPY  04-01-12   per Dr. Fuller Plan, tubular adenoma, repeat in 5 yrs   . POLYPECTOMY    . TUBAL LIGATION       OB History   None      Home Medications    Prior to Admission medications   Medication Sig Start Date End Date Taking? Authorizing Provider  allopurinol (ZYLOPRIM) 300 MG tablet Take 1 tablet (300 mg total) by mouth daily. 08/07/18   Laurey Morale, MD  Ascorbic Acid (VITAMIN C) 1000 MG tablet Take 1,000 mg by mouth daily.    [provider]  aspirin EC 81 MG tablet Take 81 mg by mouth daily.    [provider]  atorvastatin (LIPITOR) 40 MG tablet Take 1 tablet (40 mg total) by mouth daily. 08/07/18 08/07/19  Laurey Morale, MD  Cholecalciferol (VITAMIN D) 2000 units CAPS Take 2,000 Units by mouth daily.     [provider]  diazepam (VALIUM) 5 MG tablet Take 1 tablet (5 mg total)  by mouth every 8 (eight) hours as needed for anxiety. For anxiety Patient taking differently: Take 5 mg by mouth 2 (two) times daily.  08/07/18   Laurey Morale, MD  diclofenac (VOLTAREN) 75 MG EC tablet Take 1 tablet (75 mg total) by mouth 2 (two) times daily as needed for moderate pain. Patient taking differently: Take 75 mg by mouth daily.  08/07/18   Laurey Morale, MD  diclofenac sodium (VOLTAREN) 1 % GEL Apply 1 application topically at bedtime.    [provider]  diltiazem (DILTIAZEM CD) 240 MG 24 hr capsule Take 1 capsule (240 mg total) by mouth daily. 08/07/18   Laurey Morale, MD  famotidine (PEPCID) 10 MG tablet Take 10 mg by mouth 2 (two)  times daily.    [provider]  fluticasone (FLONASE) 50 MCG/ACT nasal spray Place 2 sprays into both nostrils 2 (two) times daily as needed. For allergies Patient not taking: Reported on 09/16/2018 08/01/15 09/16/18  Laurey Morale, MD  glipiZIDE (GLUCOTROL XL) 5 MG 24 hr tablet TAKE ONE (1) TABLET BY MOUTH EACH DAY Patient taking differently: Take 5 mg by mouth daily with breakfast.  05/05/18   Laurey Morale, MD  hydrochlorothiazide (HYDRODIURIL) 25 MG tablet Take 1 tablet (25 mg total) by mouth daily. 08/07/18   Laurey Morale, MD  HYDROcodone-acetaminophen (NORCO) 10-325 MG tablet Take 1 tablet by mouth every 6 (six) hours as needed for moderate pain. 11/09/18 12/09/18  Laurey Morale, MD  hydroxychloroquine (PLAQUENIL) 200 MG tablet Take 200 mg by mouth daily.     [provider]  Multiple Vitamins-Calcium (ONE-A-DAY WOMENS PO) Take 1 tablet by mouth daily.    [provider]  polyethylene glycol powder (GLYCOLAX/MIRALAX) powder Take 8.5-17 g by mouth at bedtime.     [provider]  promethazine-codeine (PHENERGAN WITH CODEINE) 6.25-10 MG/5ML syrup Take 5 mLs by mouth every 6 (six) hours as needed. Patient taking differently: Take 5 mLs by mouth every 6 (six) hours as needed (for IBS).  11/16/18 12/16/18  Laurey Morale, MD  ranitidine (ZANTAC) 150 MG tablet Take 1 tablet (150 mg total) by mouth 2 (two) times daily. Patient not taking: Reported on 09/16/2018 08/07/18   Laurey Morale, MD  saccharomyces boulardii (FLORASTOR) 250 MG capsule Take 250 mg by mouth daily.    [provider]  tobramycin-dexamethasone [facility]) ophthalmic solution Place 1 drop into the right eye 4 (four) times daily.    [provider]    Family History Family History  Problem Relation Age of Onset  . Alcohol abuse Mother   . Diabetes Mother   . Heart disease Father   . Heart disease Brother     Social History Social History   Tobacco Use  . Smoking status:  Current Every Day Smoker    Packs/day: 1.00    Years: 1.00    Pack years: 1.00    Types: Cigarettes  . Smokeless tobacco: Never Used  Substance Use Topics  . Alcohol use: Yes    Alcohol/week: 0.0 standard drinks    Comment: glass of wine daily  . Drug use: No     Allergies   Losartan; Benazepril; Lasix [furosemide]; Levaquin [levofloxacin]; Phenobarbital; and Tape   Review of Systems Review of Systems  Constitutional: Negative for chills and fever.  HENT: Positive for sore throat. Negative for congestion, dental problem, drooling, ear discharge, ear pain, mouth sores, nosebleeds, postnasal drip, sinus pressure and sinus pain.  Eyes: Negative for pain and visual disturbance.  Respiratory: Positive for cough. Negative for shortness of breath.   Cardiovascular: Negative for chest pain and palpitations.  Gastrointestinal: Negative for abdominal pain and vomiting.  Genitourinary: Negative for dysuria and hematuria.  Musculoskeletal: Negative for arthralgias and back pain.  Skin: Negative for color change and rash.  Neurological: Negative for seizures, syncope and headaches.  All other systems reviewed and are negative.    Physical Exam Updated Vital Signs  ED Triage Vitals  Enc Vitals Group     BP 09/27/18 1021 (!) 150/67     Pulse Rate 09/27/18 1021 73     Resp 09/27/18 1021 16     Temp 09/27/18 1021 98.3 F (36.8 C)     Temp Source 09/27/18 1021 Oral     SpO2 --      Weight 09/27/18 1019 169 lb (76.7 kg)     Height 09/27/18 1019 5' 2.5" (1.588 m)     Head Circumference --      Peak Flow --      Pain Score 09/27/18 1019 7     Pain Loc --      Pain Edu? --      Excl. in Interior? --     Physical Exam  Constitutional: She appears well-developed and well-nourished. No distress.  HENT:  Head: Normocephalic and atraumatic.  Mouth/Throat: Uvula is midline, oropharynx is clear and moist and mucous membranes are normal. No oral lesions. No uvula swelling. No oropharyngeal  exudate, posterior oropharyngeal edema, posterior oropharyngeal erythema or tonsillar abscesses.  Eyes: Pupils are equal, round, and reactive to light. Conjunctivae and EOM are normal.  Neck: Neck supple.  Cardiovascular: Normal rate and regular rhythm.  No murmur heard. Pulmonary/Chest: Effort normal and breath sounds normal. No respiratory distress. She has no wheezes. She has no rhonchi. She has no rales.  Abdominal: Soft. There is no tenderness.  Musculoskeletal: She exhibits no edema.  Neurological: She is alert.  Skin: Skin is warm and dry. Capillary refill takes less than 2 seconds.  Psychiatric: She has a normal mood and affect.  Nursing note and vitals reviewed.    ED Treatments / Results  Labs (all labs ordered are listed, but only abnormal results are displayed) Labs Reviewed  GROUP A STREP BY PCR    EKG None  Radiology Dg Chest 2 View  Result Date: 09/27/2018 CLINICAL DATA:  Sore throat since Friday. EXAM: CHEST - 2 VIEW COMPARISON:  07/13/2015 FINDINGS: The heart size and mediastinal contours are within normal limits. Both lungs are clear. The visualized skeletal structures are unremarkable. IMPRESSION: No active cardiopulmonary disease. Electronically Signed   By: Kerby Moors M.D.   On: 09/27/2018 11:07    Procedures Procedures (including critical care time)  Medications Ordered in ED Medications - No data to display   Initial Impression / Assessment and Plan / ED Course  I have reviewed the triage vital signs and the nursing notes.  Pertinent labs & imaging results that were available during my care of the patient were reviewed by me and considered in my medical decision making (see chart for details).     HALI BALGOBIN is a 72 year old female with history of back pain who presents to the ED with sore throat.  Patient with normal vitals.  No fever.  Patient has had sore throat for the last several days.  Denies any cough, sputum production.  She is  concerned for possible infection because she is due  for back surgery this week and wants to make sure she can still have surgery.  Overall she is well-appearing.  Has clear breath sounds.  Has no lymphadenopathy.  No signs of pharyngitis on exam.  No abdominal tenderness.  Strep screen is negative.  Chest x-ray showed no signs of pneumonia, no pneumothorax.  No concern for acute infectious process at this time.  She might have mild URI.  Given reassurance.  Recommend follow-up with primary care doctor or symptoms worsen.  Told her return to the ED as well.  Discharged from ED in good condition.  This chart was dictated using voice recognition software.  Despite best efforts to proofread,  errors can occur which can change the documentation meaning.   Final Clinical Impressions(s) / ED Diagnoses   Final diagnoses:  Sore throat    ED Discharge Orders    None       Lennice Sites, DO 09/27/18 1120

## 2018-09-27 NOTE — ED Triage Notes (Signed)
Sore throat since Friday. Concerned because she is scheduled for back surgery next week.

## 2018-09-29 ENCOUNTER — Inpatient Hospital Stay (HOSPITAL_COMMUNITY): Payer: Medicare Other | Admitting: Vascular Surgery

## 2018-09-29 ENCOUNTER — Other Ambulatory Visit: Payer: Self-pay

## 2018-09-29 ENCOUNTER — Inpatient Hospital Stay (HOSPITAL_COMMUNITY): Payer: Medicare Other | Admitting: Anesthesiology

## 2018-09-29 ENCOUNTER — Inpatient Hospital Stay (HOSPITAL_COMMUNITY)
Admission: RE | Disposition: A | Discharge status: Home / Self Care | Payer: Self-pay | Source: Ambulatory Visit | Attending: Neurosurgery

## 2018-09-29 ENCOUNTER — Inpatient Hospital Stay (HOSPITAL_COMMUNITY)
Admission: RE | Admit: 2018-09-29 | Discharge: 2018-09-30 | DRG: 455 | Disposition: A | Discharge status: Home / Self Care | Payer: Medicare Other | Source: Ambulatory Visit | Attending: Neurosurgery | Admitting: Neurosurgery

## 2018-09-29 ENCOUNTER — Encounter (HOSPITAL_COMMUNITY): Payer: Self-pay

## 2018-09-29 ENCOUNTER — Inpatient Hospital Stay (HOSPITAL_COMMUNITY): Payer: Medicare Other

## 2018-09-29 DIAGNOSIS — M4316 Spondylolisthesis, lumbar region: Secondary | ICD-10-CM | POA: Diagnosis present

## 2018-09-29 DIAGNOSIS — Z419 Encounter for procedure for purposes other than remedying health state, unspecified: Secondary | ICD-10-CM

## 2018-09-29 DIAGNOSIS — F17210 Nicotine dependence, cigarettes, uncomplicated: Secondary | ICD-10-CM | POA: Diagnosis present

## 2018-09-29 DIAGNOSIS — Z885 Allergy status to narcotic agent status: Secondary | ICD-10-CM

## 2018-09-29 DIAGNOSIS — M48061 Spinal stenosis, lumbar region without neurogenic claudication: Principal | ICD-10-CM | POA: Diagnosis present

## 2018-09-29 DIAGNOSIS — E1122 Type 2 diabetes mellitus with diabetic chronic kidney disease: Secondary | ICD-10-CM | POA: Diagnosis present

## 2018-09-29 DIAGNOSIS — Z794 Long term (current) use of insulin: Secondary | ICD-10-CM

## 2018-09-29 DIAGNOSIS — Z7982 Long term (current) use of aspirin: Secondary | ICD-10-CM | POA: Diagnosis not present

## 2018-09-29 DIAGNOSIS — E785 Hyperlipidemia, unspecified: Secondary | ICD-10-CM | POA: Diagnosis present

## 2018-09-29 DIAGNOSIS — Z8614 Personal history of Methicillin resistant Staphylococcus aureus infection: Secondary | ICD-10-CM | POA: Diagnosis not present

## 2018-09-29 DIAGNOSIS — M7138 Other bursal cyst, other site: Secondary | ICD-10-CM | POA: Diagnosis present

## 2018-09-29 DIAGNOSIS — J449 Chronic obstructive pulmonary disease, unspecified: Secondary | ICD-10-CM | POA: Diagnosis present

## 2018-09-29 DIAGNOSIS — N189 Chronic kidney disease, unspecified: Secondary | ICD-10-CM | POA: Diagnosis present

## 2018-09-29 DIAGNOSIS — I129 Hypertensive chronic kidney disease with stage 1 through stage 4 chronic kidney disease, or unspecified chronic kidney disease: Secondary | ICD-10-CM | POA: Diagnosis present

## 2018-09-29 DIAGNOSIS — F419 Anxiety disorder, unspecified: Secondary | ICD-10-CM | POA: Diagnosis present

## 2018-09-29 DIAGNOSIS — Z91048 Other nonmedicinal substance allergy status: Secondary | ICD-10-CM | POA: Diagnosis not present

## 2018-09-29 DIAGNOSIS — Z79899 Other long term (current) drug therapy: Secondary | ICD-10-CM | POA: Diagnosis not present

## 2018-09-29 DIAGNOSIS — Z881 Allergy status to other antibiotic agents status: Secondary | ICD-10-CM | POA: Diagnosis not present

## 2018-09-29 DIAGNOSIS — M4310 Spondylolisthesis, site unspecified: Secondary | ICD-10-CM | POA: Diagnosis present

## 2018-09-29 DIAGNOSIS — K219 Gastro-esophageal reflux disease without esophagitis: Secondary | ICD-10-CM | POA: Diagnosis present

## 2018-09-29 LAB — GLUCOSE, CAPILLARY
GLUCOSE-CAPILLARY: 130 mg/dL — AB (ref 70–99)
GLUCOSE-CAPILLARY: 112 mg/dL — AB (ref 70–99)
GLUCOSE-CAPILLARY: 141 mg/dL — AB (ref 70–99)
GLUCOSE-CAPILLARY: 167 mg/dL — AB (ref 70–99)
Glucose-Capillary: 126 mg/dL — ABNORMAL HIGH (ref 70–99)

## 2018-09-29 SURGERY — POSTERIOR LUMBAR FUSION 1 LEVEL
Anesthesia: General | Site: Back

## 2018-09-29 MED ORDER — BISACODYL 10 MG RE SUPP: 10.0000 mg | Freq: Every day | RECTAL | Status: DC | PRN

## 2018-09-29 MED ORDER — ONDANSETRON HCL 4 MG/2ML IJ SOLN
INTRAMUSCULAR | Status: AC
  Filled 2018-09-29: qty 2

## 2018-09-29 MED ORDER — HYDROCHLOROTHIAZIDE 25 MG PO TABS
25.0000 mg | ORAL_TABLET | Freq: Every day | ORAL | Status: DC
  Administered 2018-09-30: 25 mg via ORAL
  Filled 2018-09-29: qty 1

## 2018-09-29 MED ORDER — MENTHOL 3 MG MT LOZG: 1.0000 | LOZENGE | OROMUCOSAL | Status: DC | PRN

## 2018-09-29 MED ORDER — LIDOCAINE 2% (20 MG/ML) 5 ML SYRINGE
INTRAMUSCULAR | Status: DC | PRN
  Administered 2018-09-29: 60 mg via INTRAVENOUS

## 2018-09-29 MED ORDER — THROMBIN (RECOMBINANT) 20000 UNITS EX SOLR
CUTANEOUS | Status: AC
  Filled 2018-09-29: qty 20000

## 2018-09-29 MED ORDER — DILTIAZEM HCL ER COATED BEADS 120 MG PO CP24
240.0000 mg | ORAL_CAPSULE | Freq: Every day | ORAL | Status: DC
  Administered 2018-09-30: 240 mg via ORAL
  Filled 2018-09-29: qty 2

## 2018-09-29 MED ORDER — POLYETHYLENE GLYCOL 3350 17 G PO PACK
17.0000 g | PACK | Freq: Every day | ORAL | Status: DC | PRN
  Administered 2018-09-29: 17 g via ORAL

## 2018-09-29 MED ORDER — DEXAMETHASONE SODIUM PHOSPHATE 10 MG/ML IJ SOLN
10.0000 mg | INTRAMUSCULAR | Status: AC
  Administered 2018-09-29: 10 mg via INTRAVENOUS
  Filled 2018-09-29: qty 1

## 2018-09-29 MED ORDER — PROPOFOL 10 MG/ML IV BOLUS
INTRAVENOUS | Status: AC
  Filled 2018-09-29: qty 20

## 2018-09-29 MED ORDER — BUPIVACAINE HCL (PF) 0.25 % IJ SOLN
INTRAMUSCULAR | Status: AC
  Filled 2018-09-29: qty 30

## 2018-09-29 MED ORDER — CEFAZOLIN SODIUM-DEXTROSE 2-4 GM/100ML-% IV SOLN
2.0000 g | INTRAVENOUS | Status: AC
  Administered 2018-09-29: 2 g via INTRAVENOUS
  Filled 2018-09-29: qty 100

## 2018-09-29 MED ORDER — LIDOCAINE 2% (20 MG/ML) 5 ML SYRINGE
INTRAMUSCULAR | Status: AC
  Filled 2018-09-29: qty 5

## 2018-09-29 MED ORDER — MIDAZOLAM HCL 2 MG/2ML IJ SOLN
INTRAMUSCULAR | Status: AC
  Filled 2018-09-29: qty 2

## 2018-09-29 MED ORDER — SACCHAROMYCES BOULARDII 250 MG PO CAPS
250.0000 mg | ORAL_CAPSULE | Freq: Every day | ORAL | Status: DC
  Administered 2018-09-30: 250 mg via ORAL
  Filled 2018-09-29: qty 1

## 2018-09-29 MED ORDER — EPHEDRINE SULFATE 50 MG/ML IJ SOLN
INTRAMUSCULAR | Status: DC | PRN
  Administered 2018-09-29: 5 mg via INTRAVENOUS

## 2018-09-29 MED ORDER — GLYCOPYRROLATE PF 0.2 MG/ML IJ SOSY
PREFILLED_SYRINGE | INTRAMUSCULAR | Status: AC
  Filled 2018-09-29: qty 1

## 2018-09-29 MED ORDER — ALLOPURINOL 300 MG PO TABS
300.0000 mg | ORAL_TABLET | Freq: Every day | ORAL | Status: DC
  Administered 2018-09-30: 300 mg via ORAL
  Filled 2018-09-29: qty 1

## 2018-09-29 MED ORDER — CEFAZOLIN SODIUM-DEXTROSE 1-4 GM/50ML-% IV SOLN
1.0000 g | Freq: Three times a day (TID) | INTRAVENOUS | Status: AC
  Administered 2018-09-29 – 2018-09-30 (×2): 1 g via INTRAVENOUS
  Filled 2018-09-29 (×2): qty 50

## 2018-09-29 MED ORDER — 0.9 % SODIUM CHLORIDE (POUR BTL) OPTIME
TOPICAL | Status: DC | PRN
  Administered 2018-09-29: 1000 mL

## 2018-09-29 MED ORDER — ATORVASTATIN CALCIUM 20 MG PO TABS
40.0000 mg | ORAL_TABLET | Freq: Every day | ORAL | Status: DC
  Administered 2018-09-30: 40 mg via ORAL
  Filled 2018-09-29: qty 2

## 2018-09-29 MED ORDER — ONDANSETRON HCL 4 MG/2ML IJ SOLN
INTRAMUSCULAR | Status: DC | PRN
  Administered 2018-09-29: 4 mg via INTRAVENOUS

## 2018-09-29 MED ORDER — HYDROXYCHLOROQUINE SULFATE 200 MG PO TABS
200.0000 mg | ORAL_TABLET | Freq: Every day | ORAL | Status: DC
  Administered 2018-09-30: 200 mg via ORAL
  Filled 2018-09-29: qty 1

## 2018-09-29 MED ORDER — KETAMINE HCL 10 MG/ML IJ SOLN
INTRAMUSCULAR | Status: DC | PRN
  Administered 2018-09-29 (×2): 20 mg via INTRAVENOUS
  Administered 2018-09-29: 10 mg via INTRAVENOUS

## 2018-09-29 MED ORDER — ONDANSETRON HCL 4 MG/2ML IJ SOLN: 4.0000 mg | Freq: Four times a day (QID) | INTRAMUSCULAR | Status: DC | PRN

## 2018-09-29 MED ORDER — GLYCOPYRROLATE PF 0.2 MG/ML IJ SOSY
PREFILLED_SYRINGE | INTRAMUSCULAR | Status: DC | PRN
  Administered 2018-09-29 (×2): .1 mg via INTRAVENOUS
  Administered 2018-09-29: .2 mg via INTRAVENOUS

## 2018-09-29 MED ORDER — DEXAMETHASONE SODIUM PHOSPHATE 10 MG/ML IJ SOLN
INTRAMUSCULAR | Status: AC
  Filled 2018-09-29: qty 1

## 2018-09-29 MED ORDER — SODIUM CHLORIDE 0.9 % IV SOLN
INTRAVENOUS | Status: DC | PRN
  Administered 2018-09-29: 30 ug/min via INTRAVENOUS

## 2018-09-29 MED ORDER — SODIUM CHLORIDE 0.9 % IV SOLN: 250.0000 mL | INTRAVENOUS | Status: DC

## 2018-09-29 MED ORDER — KETAMINE HCL 50 MG/5ML IJ SOSY
PREFILLED_SYRINGE | INTRAMUSCULAR | Status: AC
  Filled 2018-09-29: qty 5

## 2018-09-29 MED ORDER — VITAMIN C 500 MG PO TABS
1000.0000 mg | ORAL_TABLET | Freq: Every day | ORAL | Status: DC
  Administered 2018-09-30: 1000 mg via ORAL
  Filled 2018-09-29: qty 2

## 2018-09-29 MED ORDER — OXYCODONE HCL 5 MG/5ML PO SOLN: 5.0000 mg | Freq: Once | ORAL | Status: AC | PRN

## 2018-09-29 MED ORDER — ONDANSETRON HCL 4 MG PO TABS: 4.0000 mg | ORAL_TABLET | Freq: Four times a day (QID) | ORAL | Status: DC | PRN

## 2018-09-29 MED ORDER — PHENOL 1.4 % MT LIQD: 1.0000 | OROMUCOSAL | Status: DC | PRN

## 2018-09-29 MED ORDER — FLEET ENEMA 7-19 GM/118ML RE ENEM: 1.0000 | ENEMA | Freq: Once | RECTAL | Status: DC | PRN

## 2018-09-29 MED ORDER — GLIPIZIDE ER 5 MG PO TB24
5.0000 mg | ORAL_TABLET | Freq: Every day | ORAL | Status: DC
  Administered 2018-09-30: 5 mg via ORAL
  Filled 2018-09-29: qty 1

## 2018-09-29 MED ORDER — HYDROMORPHONE HCL 1 MG/ML IJ SOLN
1.0000 mg | INTRAMUSCULAR | Status: DC | PRN
  Administered 2018-09-29: 1 mg via INTRAVENOUS
  Filled 2018-09-29: qty 1

## 2018-09-29 MED ORDER — OXYCODONE HCL 5 MG PO TABS: 10.0000 mg | ORAL_TABLET | ORAL | Status: DC | PRN

## 2018-09-29 MED ORDER — ONDANSETRON HCL 4 MG/2ML IJ SOLN: 4.0000 mg | Freq: Once | INTRAMUSCULAR | Status: DC | PRN

## 2018-09-29 MED ORDER — FENTANYL CITRATE (PF) 100 MCG/2ML IJ SOLN
INTRAMUSCULAR | Status: DC | PRN
  Administered 2018-09-29: 50 ug via INTRAVENOUS
  Administered 2018-09-29: 100 ug via INTRAVENOUS
  Administered 2018-09-29 (×2): 50 ug via INTRAVENOUS

## 2018-09-29 MED ORDER — ROCURONIUM BROMIDE 50 MG/5ML IV SOSY
PREFILLED_SYRINGE | INTRAVENOUS | Status: AC
  Filled 2018-09-29: qty 10

## 2018-09-29 MED ORDER — CHLORHEXIDINE GLUCONATE CLOTH 2 % EX PADS: 6.0000 | MEDICATED_PAD | Freq: Once | CUTANEOUS | Status: DC

## 2018-09-29 MED ORDER — POLYETHYLENE GLYCOL 3350 17 G PO PACK
17.0000 g | PACK | Freq: Every day | ORAL | Status: DC
  Administered 2018-09-29: 17 g via ORAL
  Filled 2018-09-29: qty 1

## 2018-09-29 MED ORDER — ACETAMINOPHEN 325 MG PO TABS: 650.0000 mg | ORAL_TABLET | ORAL | Status: DC | PRN

## 2018-09-29 MED ORDER — THROMBIN 5000 UNITS EX SOLR
CUTANEOUS | Status: AC
  Filled 2018-09-29: qty 5000

## 2018-09-29 MED ORDER — OXYCODONE HCL 5 MG PO TABS
ORAL_TABLET | ORAL | Status: AC
  Administered 2018-09-29: 5 mg via ORAL
  Filled 2018-09-29: qty 1

## 2018-09-29 MED ORDER — PROPOFOL 500 MG/50ML IV EMUL
INTRAVENOUS | Status: DC | PRN
  Administered 2018-09-29: 150 ug/kg/min via INTRAVENOUS
  Administered 2018-09-29: 12:00:00 via INTRAVENOUS

## 2018-09-29 MED ORDER — VANCOMYCIN HCL 1000 MG IV SOLR
INTRAVENOUS | Status: AC
  Filled 2018-09-29: qty 1000

## 2018-09-29 MED ORDER — FENTANYL CITRATE (PF) 250 MCG/5ML IJ SOLN
INTRAMUSCULAR | Status: AC
  Filled 2018-09-29: qty 5

## 2018-09-29 MED ORDER — BUPIVACAINE HCL (PF) 0.25 % IJ SOLN
INTRAMUSCULAR | Status: DC | PRN
  Administered 2018-09-29: 20 mL

## 2018-09-29 MED ORDER — OXYCODONE HCL 5 MG PO TABS
5.0000 mg | ORAL_TABLET | Freq: Once | ORAL | Status: AC | PRN
  Administered 2018-09-29: 5 mg via ORAL

## 2018-09-29 MED ORDER — FENTANYL CITRATE (PF) 100 MCG/2ML IJ SOLN
25.0000 ug | INTRAMUSCULAR | Status: DC | PRN
  Administered 2018-09-29 (×3): 50 ug via INTRAVENOUS

## 2018-09-29 MED ORDER — MIDAZOLAM HCL 5 MG/5ML IJ SOLN
INTRAMUSCULAR | Status: DC | PRN
  Administered 2018-09-29: 1 mg via INTRAVENOUS

## 2018-09-29 MED ORDER — DIAZEPAM 5 MG PO TABS
5.0000 mg | ORAL_TABLET | Freq: Four times a day (QID) | ORAL | Status: DC | PRN
  Administered 2018-09-29 – 2018-09-30 (×3): 5 mg via ORAL
  Filled 2018-09-29 (×3): qty 1

## 2018-09-29 MED ORDER — ACETAMINOPHEN 650 MG RE SUPP: 650.0000 mg | RECTAL | Status: DC | PRN

## 2018-09-29 MED ORDER — POLYETHYLENE GLYCOL 3350 17 GM/SCOOP PO POWD: 8.5000 g | Freq: Every day | ORAL | Status: DC

## 2018-09-29 MED ORDER — THROMBIN 20000 UNITS EX SOLR
CUTANEOUS | Status: DC | PRN
  Administered 2018-09-29: 20 mL via TOPICAL

## 2018-09-29 MED ORDER — SUGAMMADEX SODIUM 200 MG/2ML IV SOLN
INTRAVENOUS | Status: DC | PRN
  Administered 2018-09-29: 200 mg via INTRAVENOUS

## 2018-09-29 MED ORDER — FENTANYL CITRATE (PF) 100 MCG/2ML IJ SOLN
INTRAMUSCULAR | Status: AC
  Administered 2018-09-29: 50 ug via INTRAVENOUS
  Filled 2018-09-29: qty 2

## 2018-09-29 MED ORDER — DICLOFENAC SODIUM 75 MG PO TBEC
75.0000 mg | DELAYED_RELEASE_TABLET | Freq: Every day | ORAL | Status: DC
  Administered 2018-09-30: 75 mg via ORAL
  Filled 2018-09-29: qty 1

## 2018-09-29 MED ORDER — SODIUM CHLORIDE 0.9 % IV SOLN
INTRAVENOUS | Status: DC | PRN
  Administered 2018-09-29: 500 mL

## 2018-09-29 MED ORDER — SODIUM CHLORIDE 0.9% FLUSH: 3.0000 mL | INTRAVENOUS | Status: DC | PRN

## 2018-09-29 MED ORDER — LACTATED RINGERS IV SOLN
INTRAVENOUS | Status: DC | PRN
  Administered 2018-09-29 (×2): via INTRAVENOUS

## 2018-09-29 MED ORDER — VANCOMYCIN HCL 1 G IV SOLR
INTRAVENOUS | Status: DC | PRN
  Administered 2018-09-29: 1000 mg

## 2018-09-29 MED ORDER — PROPOFOL 10 MG/ML IV BOLUS
INTRAVENOUS | Status: DC | PRN
  Administered 2018-09-29: 150 mg via INTRAVENOUS

## 2018-09-29 MED ORDER — TOBRAMYCIN-DEXAMETHASONE 0.3-0.1 % OP SUSP
1.0000 [drp] | Freq: Four times a day (QID) | OPHTHALMIC | Status: DC
  Filled 2018-09-29: qty 2.5

## 2018-09-29 MED ORDER — VITAMIN D 1000 UNITS PO TABS
2000.0000 [IU] | ORAL_TABLET | Freq: Every day | ORAL | Status: DC
  Administered 2018-09-30: 2000 [IU] via ORAL
  Filled 2018-09-29: qty 2

## 2018-09-29 MED ORDER — FAMOTIDINE 20 MG PO TABS
10.0000 mg | ORAL_TABLET | Freq: Two times a day (BID) | ORAL | Status: DC
  Administered 2018-09-29 – 2018-09-30 (×2): 10 mg via ORAL
  Filled 2018-09-29 (×2): qty 1

## 2018-09-29 MED ORDER — SODIUM CHLORIDE 0.9% FLUSH: 3.0000 mL | Freq: Two times a day (BID) | INTRAVENOUS | Status: DC

## 2018-09-29 MED ORDER — LACTATED RINGERS IV SOLN
INTRAVENOUS | Status: DC
  Administered 2018-09-29: 10:00:00 via INTRAVENOUS

## 2018-09-29 MED ORDER — HYDROCODONE-ACETAMINOPHEN 10-325 MG PO TABS
1.0000 | ORAL_TABLET | ORAL | Status: DC | PRN
  Administered 2018-09-29 – 2018-09-30 (×5): 1 via ORAL
  Filled 2018-09-29 (×5): qty 1

## 2018-09-29 MED ORDER — ROCURONIUM BROMIDE 10 MG/ML (PF) SYRINGE
PREFILLED_SYRINGE | INTRAVENOUS | Status: DC | PRN
  Administered 2018-09-29: 10 mg via INTRAVENOUS
  Administered 2018-09-29: 50 mg via INTRAVENOUS
  Administered 2018-09-29: 30 mg via INTRAVENOUS
  Administered 2018-09-29: 10 mg via INTRAVENOUS

## 2018-09-29 MED ORDER — FENTANYL CITRATE (PF) 100 MCG/2ML IJ SOLN
INTRAMUSCULAR | Status: AC
  Filled 2018-09-29: qty 2

## 2018-09-29 SURGICAL SUPPLY — 70 items
BAG DECANTER FOR FLEXI CONT (MISCELLANEOUS) ×2 IMPLANT
BENZOIN TINCTURE PRP APPL 2/3 (GAUZE/BANDAGES/DRESSINGS) ×2 IMPLANT
BLADE CLIPPER SURG (BLADE) IMPLANT
BUR CUTTER 7.0 ROUND (BURR) IMPLANT
BUR MATCHSTICK NEURO 3.0 LAGG (BURR) ×2 IMPLANT
CANISTER SUCT 3000ML PPV (MISCELLANEOUS) ×2 IMPLANT
CAP LCK SPNE (Orthopedic Implant) ×4 IMPLANT
CAP LOCK SPINE RADIUS (Orthopedic Implant) ×4 IMPLANT
CAP LOCKING (Orthopedic Implant) ×4 IMPLANT
CARTRIDGE OIL MAESTRO DRILL (MISCELLANEOUS) ×1 IMPLANT
CLSR STERI-STRIP ANTIMIC 1/2X4 (GAUZE/BANDAGES/DRESSINGS) ×2 IMPLANT
CONT SPEC 4OZ CLIKSEAL STRL BL (MISCELLANEOUS) ×2 IMPLANT
COVER BACK TABLE 60X90IN (DRAPES) ×2 IMPLANT
COVER WAND RF STERILE (DRAPES) IMPLANT
DECANTER SPIKE VIAL GLASS SM (MISCELLANEOUS) ×2 IMPLANT
DERMABOND ADVANCED (GAUZE/BANDAGES/DRESSINGS) ×1
DERMABOND ADVANCED .7 DNX12 (GAUZE/BANDAGES/DRESSINGS) ×1 IMPLANT
DEVICE INTERBODY ELEVATE 23X8 (Cage) ×2 IMPLANT
DIFFUSER DRILL AIR PNEUMATIC (MISCELLANEOUS) ×2 IMPLANT
DRAPE C-ARM 42X72 X-RAY (DRAPES) ×4 IMPLANT
DRAPE HALF SHEET 40X57 (DRAPES) ×2 IMPLANT
DRAPE LAPAROTOMY 100X72X124 (DRAPES) ×2 IMPLANT
DRAPE SURG 17X23 STRL (DRAPES) ×8 IMPLANT
DRSG OPSITE POSTOP 4X6 (GAUZE/BANDAGES/DRESSINGS) ×2 IMPLANT
DURAPREP 26ML APPLICATOR (WOUND CARE) ×2 IMPLANT
ELECT REM PT RETURN 9FT ADLT (ELECTROSURGICAL) ×2
ELECTRODE REM PT RTRN 9FT ADLT (ELECTROSURGICAL) ×1 IMPLANT
EVACUATOR 1/8 PVC DRAIN (DRAIN) IMPLANT
GAUZE 4X4 16PLY RFD (DISPOSABLE) IMPLANT
GAUZE SPONGE 4X4 12PLY STRL (GAUZE/BANDAGES/DRESSINGS) IMPLANT
GLOVE BIO SURGEON STRL SZ7.5 (GLOVE) ×2 IMPLANT
GLOVE BIOGEL PI IND STRL 6.5 (GLOVE) ×1 IMPLANT
GLOVE BIOGEL PI IND STRL 7.0 (GLOVE) ×2 IMPLANT
GLOVE BIOGEL PI IND STRL 7.5 (GLOVE) ×3 IMPLANT
GLOVE BIOGEL PI INDICATOR 6.5 (GLOVE) ×1
GLOVE BIOGEL PI INDICATOR 7.0 (GLOVE) ×2
GLOVE BIOGEL PI INDICATOR 7.5 (GLOVE) ×3
GLOVE ECLIPSE 9.0 STRL (GLOVE) ×4 IMPLANT
GLOVE EXAM NITRILE LRG STRL (GLOVE) IMPLANT
GLOVE EXAM NITRILE XL STR (GLOVE) IMPLANT
GLOVE EXAM NITRILE XS STR PU (GLOVE) IMPLANT
GLOVE SURG SS PI 6.0 STRL IVOR (GLOVE) ×4 IMPLANT
GLOVE SURG SS PI 7.5 STRL IVOR (GLOVE) ×10 IMPLANT
GOWN STRL REUS W/ TWL LRG LVL3 (GOWN DISPOSABLE) ×4 IMPLANT
GOWN STRL REUS W/ TWL XL LVL3 (GOWN DISPOSABLE) ×2 IMPLANT
GOWN STRL REUS W/TWL 2XL LVL3 (GOWN DISPOSABLE) IMPLANT
GOWN STRL REUS W/TWL LRG LVL3 (GOWN DISPOSABLE) ×4
GOWN STRL REUS W/TWL XL LVL3 (GOWN DISPOSABLE) ×2
KIT BASIN OR (CUSTOM PROCEDURE TRAY) ×2 IMPLANT
KIT TURNOVER KIT B (KITS) ×2 IMPLANT
MILL MEDIUM DISP (BLADE) ×2 IMPLANT
NEEDLE HYPO 22GX1.5 SAFETY (NEEDLE) ×2 IMPLANT
NS IRRIG 1000ML POUR BTL (IV SOLUTION) ×2 IMPLANT
OIL CARTRIDGE MAESTRO DRILL (MISCELLANEOUS) ×2
PACK LAMINECTOMY NEURO (CUSTOM PROCEDURE TRAY) ×2 IMPLANT
ROD 5.5X30MM (Rod) ×4 IMPLANT
SCREW 5.75X40M (Screw) ×4 IMPLANT
SCREW 5.75X45MM (Screw) ×4 IMPLANT
SPACER SPNL STD 23X8XSTRL (Cage) ×2 IMPLANT
SPCR SPNL STD 23X8XSTRL (Cage) ×2 IMPLANT
SPONGE SURGIFOAM ABS GEL 100 (HEMOSTASIS) ×2 IMPLANT
STRIP CLOSURE SKIN 1/2X4 (GAUZE/BANDAGES/DRESSINGS) ×4 IMPLANT
SUT VIC AB 0 CT1 18XCR BRD8 (SUTURE) ×1 IMPLANT
SUT VIC AB 0 CT1 8-18 (SUTURE) ×1
SUT VIC AB 2-0 CT1 18 (SUTURE) ×2 IMPLANT
SUT VIC AB 3-0 SH 8-18 (SUTURE) ×4 IMPLANT
TOWEL GREEN STERILE (TOWEL DISPOSABLE) ×2 IMPLANT
TOWEL GREEN STERILE FF (TOWEL DISPOSABLE) ×2 IMPLANT
TRAY FOLEY MTR SLVR 16FR STAT (SET/KITS/TRAYS/PACK) ×2 IMPLANT
WATER STERILE IRR 1000ML POUR (IV SOLUTION) ×2 IMPLANT

## 2018-09-29 NOTE — Op Note (Signed)
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  Bilateral L4-5 ponte osteotomies for sagittal plane correction  L4-5 posterior lumbar interbody fusion utilizing interbody cages, locally harvested autograft,  L4-5 posterior lateral arthrodesis utilizing nonsegmental pedicle screw instrumentation  Surgeon:Colisha Redler A.Aeisha Minarik, M.D.  Asst. Surgeon: Venetia Constable  Anesthesia: General  Indication: 72 year old female with back and bilateral lower extremity symptoms failing conservative management.  Work-up demonstrates evidence of multilevel disc degeneration worse at L4-5 with severe disc space collapse and grade 1 L4-5 degenerative spondylolisthesis with marked sagittal plane imbalance and severe stenosis both centrally and within the foramen.  Patient presents now for decompression and fusion.  Operative note: After induction anesthesia, patient position prone on the Wilson frame probably padded.  Lumbar region prepped and draped sterilely.  Incision made overlying L4-5.  Dissection performed bilaterally.  Retractor placed.  Fluoroscopy used.  Level confirmed.  Decompressive laminotomies and foraminotomies then performed using Leksell rongeurs Kerrison Rogers and high-speed drill to remove the inferior aspect lamina of L4 the entire inferior facet and pars interarticularis of L4 bilaterally, the superior facet of L5, and the superior aspect of the L4-5 lamina.  Ponte osteotomies were thereby completed.  Ligament flavum elevated and resected.  There is a synovial cyst on the patient's right side which was elevated and resected.  Foraminotomies were  completed on the course the exiting L4 and L5 nerve roots bilaterally.  Bilateral discectomies then performed.  The space then prepared for interbody fusion.  With a distractor placed the patient's right side disc spaces and cleaned of soft tissue.  8 mm [employer] expandable cage packed with locally harvested autograft and impacted in place and expanded to its full extent.  Distractor removed patient's right side.  The space distracted on the right side.  The space cleaned out on the right side.  Morselized autograft packed in the interspace.  Second cage impact in place and expanded to its full extent.  Pedicles of L4 and L5 were then divided using surface landmarks and intraoperative fluoroscopy the superior bone overlying the pedicle was then removed with a high-speed drill.  Pedicle was then probed using pedicle all each pedicle tract was then tapped with a screw tapped each screw temple was then probed and found to be solidly within bone.  5.75 mill meter radius bran screws [employer] were placed bilaterally at L4 and L5.  Final images reveal good position cages and the hardware at the proper upper level with normal alignment spine.  Wound is then irrigated one final time.  Gelfoam was placed topically for hemostasis.  Transverse processes and residual facets were decorticated.  Morselized autograft packed posterior laterally.  Short segment titanium rod placed of the screws at L4 and L5.  Locking plate from the screws.  Locking caps and engaged with a construct under compression.  Vancomycin powder placed in deep wound space.  Wounds and closed in layers with Vicryl sutures.  Steri-Strips sterile dressing were applied.  No apparent patient tolerated the procedure well and she returns complications.  The patient tolerated procedure well and she returns to the recovery room postop .

## 2018-09-29 NOTE — Brief Op Note (Signed)
09/29/2018  1:36 PM  PATIENT:  Deborah Bryant  72 y.o. female  PRE-OPERATIVE DIAGNOSIS:  Stenosis  POST-OPERATIVE DIAGNOSIS:  Stenosis  PROCEDURE:  Procedure(s): POSTERIOR LUMBAR INTERBODY FUSION - LUMBAR FOUR-LUMBAR FIVE (N/A)  SURGEON:  Surgeon(s) and Role:    * Earnie Larsson, MD - Primary    * Ostergard, Joyice Faster, MD - Assisting  PHYSICIAN ASSISTANT:   ASSISTANTS:    ANESTHESIA:   general  EBL:  200 mL   BLOOD ADMINISTERED:none  DRAINS: none   LOCAL MEDICATIONS USED:  MARCAINE     SPECIMEN:  No Specimen  DISPOSITION OF SPECIMEN:  N/A  COUNTS:  YES  TOURNIQUET:  * No tourniquets in log *  DICTATION: .Dragon Dictation  PLAN OF CARE: Admit to inpatient   PATIENT DISPOSITION:  PACU - hemodynamically stable.   Delay start of Pharmacological VTE agent (>24hrs) due to surgical blood loss or risk of bleeding: yes

## 2018-09-29 NOTE — Anesthesia Preprocedure Evaluation (Addendum)
Anesthesia Evaluation  Patient identified by MRN, date of birth, ID band Patient awake    Reviewed: Allergy & Precautions, NPO status , Patient's Chart, lab work & pertinent test results  History of Anesthesia Complications (+) PONV and history of anesthetic complications  Airway Mallampati: II  TM Distance: >3 FB Neck ROM: Full    Dental  (+) Dental Advisory Given, Teeth Intact   Pulmonary COPD, Current Smoker,    breath sounds clear to auscultation       Cardiovascular hypertension, Pt. on medications (-) angina Rhythm:Regular Rate:Normal     Neuro/Psych Anxiety negative neurological ROS     GI/Hepatic Neg liver ROS, GERD  Medicated and Controlled, IBS    Endo/Other  diabetes, Type 2, Oral Hypoglycemic AgentsHypothyroidism  Obesity   Renal/GU CRFRenal disease  negative genitourinary   Musculoskeletal  (+) Arthritis ,   Abdominal   Peds  Hematology negative hematology ROS (+)   Anesthesia Other Findings   Reproductive/Obstetrics                            Anesthesia Physical Anesthesia Plan  ASA: III  Anesthesia Plan: General   Post-op Pain Management:    Induction: Intravenous  PONV Risk Score and Plan: 4 or greater and Treatment may vary due to age or medical condition, Ondansetron and TIVA  Airway Management Planned: Oral ETT  Additional Equipment: None  Intra-op Plan:   Post-operative Plan: Extubation in OR  Informed Consent: I have reviewed the patients History and Physical, chart, labs and discussed the procedure including the risks, benefits and alternatives for the proposed anesthesia with the patient or authorized representative who has indicated his/her understanding and acceptance.   Dental advisory given  Plan Discussed with: CRNA and Anesthesiologist  Anesthesia Plan Comments:       Anesthesia Quick Evaluation

## 2018-09-29 NOTE — Plan of Care (Signed)
  Problem: Safety: Goal: Ability to remain free from injury will improve Outcome: Progressing   

## 2018-09-29 NOTE — Anesthesia Postprocedure Evaluation (Signed)
Anesthesia Post Note  Patient: Deborah Bryant  Procedure(s) Performed: POSTERIOR LUMBAR INTERBODY FUSION - LUMBAR FOUR-LUMBAR FIVE (N/A Back)     Patient location during evaluation: PACU Anesthesia Type: General Level of consciousness: awake and alert Pain management: pain level controlled Vital Signs Assessment: post-procedure vital signs reviewed and stable Respiratory status: spontaneous breathing, nonlabored ventilation and respiratory function stable Cardiovascular status: blood pressure returned to baseline and stable Postop Assessment: no apparent nausea or vomiting Anesthetic complications: no    Last Vitals:  Vitals:   09/29/18 1501 09/29/18 1518  BP:  (!) 144/65  Pulse: 64 64  Resp:  19  Temp:    SpO2: 95% 100%    Last Pain:  Vitals:   09/29/18 1628  TempSrc:   PainSc: 10-Worst pain ever                 Audry Pili

## 2018-09-29 NOTE — Anesthesia Procedure Notes (Signed)
Procedure Name: Intubation Performed by: Milford Cage, CRNA Pre-anesthesia Checklist: Patient identified, Emergency Drugs available, Suction available and Patient being monitored Patient Re-evaluated:Patient Re-evaluated prior to induction Oxygen Delivery Method: Circle System Utilized Preoxygenation: Pre-oxygenation with 100% oxygen Induction Type: IV induction Ventilation: Mask ventilation without difficulty Laryngoscope Size: Miller and 2 Grade View: Grade II Tube type: Oral Tube size: 7.0 mm Number of attempts: 2 Airway Equipment and Method: Stylet Placement Confirmation: ETT inserted through vocal cords under direct vision,  positive ETCO2 and breath sounds checked- equal and bilateral Secured at: 21 cm Tube secured with: Tape Dental Injury: Teeth and Oropharynx as per pre-operative assessment  Comments: Initial approach with MAC3 resulted in G3 view. Switch providers and Mil2 resulting in G2 view

## 2018-09-29 NOTE — Transfer of Care (Signed)
Immediate Anesthesia Transfer of Care Note  Patient: Deborah Bryant  Procedure(s) Performed: POSTERIOR LUMBAR INTERBODY FUSION - LUMBAR FOUR-LUMBAR FIVE (N/A Back)  Patient Location: PACU  Anesthesia Type:General  Level of Consciousness: awake, alert  and oriented  Airway & Oxygen Therapy: Patient Spontanous Breathing and Patient connected to nasal cannula oxygen  Post-op Assessment: Report given to RN, Post -op Vital signs reviewed and stable and Patient moving all extremities X 4  Post vital signs: Reviewed and stable  Last Vitals:  Vitals Value Taken Time  BP 127/67 09/29/2018  1:41 PM  Temp 36.1 C 09/29/2018  1:40 PM  Pulse 61 09/29/2018  1:51 PM  Resp 11 09/29/2018  1:51 PM  SpO2 96 % 09/29/2018  1:51 PM  Vitals shown include unvalidated device data.  Last Pain:  Vitals:   09/29/18 1340  TempSrc:   PainSc: Asleep      Patients Stated Pain Goal: 2 (59/53/96 [id number])  Complications: No apparent anesthesia complications

## 2018-09-29 NOTE — H&P (Signed)
Deborah Bryant is an 72 y.o. female.   Chief Complaint: Back pain HPI: 72 year old female with chronic and progressive lower back pain with radiation to both lower extremities which is failed conservative management.  Symptoms consistent with neurogenic claudication.  Work-up demonstrates evidence of multilevel disc degeneration.  At L4-5 patient has evidence of severe disc degeneration with a grade 1 degenerative spondylolisthesis and severe stenosis.  Patient has failed conservative management presents now for lumbar decompression and fusion in hopes of improving her symptoms.  Past Medical History:  Diagnosis Date  . Allergic rhinitis   . Anxiety   . Arthritis    sees Dr. Gavin Pound   . Cellulitis, periorbital 2006   left eye, due to MRSA, saw Dr. Erik Obey   . Chronic kidney disease   . Complication of anesthesia   . COPD (chronic obstructive pulmonary disease) ([place])   . Cough   . Diabetes mellitus   . Dyspnea    with exertion  . GERD (gastroesophageal reflux disease)    OTC  Pepcid  . Hemorrhoids   . Hyperlipidemia   . Hypertension   . IBS (irritable bowel syndrome)   . Menopause   . 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 Pneumonia   . PONV (postoperative nausea and vomiting)   . Tubular adenoma of colon 12/2006    Past Surgical History:  Procedure Laterality Date  . APPENDECTOMY    . COLONOSCOPY  04-01-12   per Dr. Fuller Plan, tubular adenoma, repeat in 5 yrs   . POLYPECTOMY    . TUBAL LIGATION      Family History  Problem Relation Age of Onset  . Alcohol abuse Mother   . Diabetes Mother   . Heart disease Father   . Heart disease Brother    Social History:  reports that she has been smoking cigarettes. She has a 1.00 pack-year smoking history. She has never used smokeless tobacco. She reports that she drinks alcohol. She reports that she does not use drugs.  Allergies:  Allergies  Allergen Reactions  . Losartan Shortness Of  Breath  . Benazepril Cough  . Lasix [Furosemide] Other (See Comments)    Rash, changes in eyesight  . Levaquin [Levofloxacin] Other (See Comments)    Tendon and muscle pain  . Phenobarbital Nausea And Vomiting  . Tape Rash    Can use paper tape    Medications Prior to Admission  Medication Sig Dispense Refill  . allopurinol (ZYLOPRIM) 300 MG tablet Take 1 tablet (300 mg total) by mouth daily. 90 tablet 3  . Ascorbic Acid (VITAMIN C) 1000 MG tablet Take 1,000 mg by mouth daily.    Marland Kitchen aspirin EC 81 MG tablet Take 81 mg by mouth daily.    Marland Kitchen atorvastatin (LIPITOR) 40 MG tablet Take 1 tablet (40 mg total) by mouth daily. 90 tablet 3  . Cholecalciferol (VITAMIN D) 2000 units CAPS Take 2,000 Units by mouth daily.     . diazepam (VALIUM) 5 MG tablet Take 1 tablet (5 mg total) by mouth every 8 (eight) hours as needed for anxiety. For anxiety (Patient taking differently: Take 5 mg by mouth 2 (two) times daily. ) 90 tablet 5  . diclofenac (VOLTAREN) 75 MG EC tablet Take 1 tablet (75 mg total) by mouth 2 (two) times daily as needed for moderate pain. (Patient taking differently: Take 75 mg by mouth daily. ) 180 tablet 3  . diclofenac sodium (VOLTAREN) 1 % GEL  Apply 1 application topically at bedtime.    Marland Kitchen diltiazem (DILTIAZEM CD) 240 MG 24 hr capsule Take 1 capsule (240 mg total) by mouth daily. 90 capsule 3  . famotidine (PEPCID) 10 MG tablet Take 10 mg by mouth 2 (two) times daily.    Marland Kitchen glipiZIDE (GLUCOTROL XL) 5 MG 24 hr tablet TAKE ONE (1) TABLET BY MOUTH EACH DAY (Patient taking differently: Take 5 mg by mouth daily with breakfast. ) 90 tablet 3  . hydrochlorothiazide (HYDRODIURIL) 25 MG tablet Take 1 tablet (25 mg total) by mouth daily. 90 tablet 3  . [START ON 11/09/2018] HYDROcodone-acetaminophen (NORCO) 10-325 MG tablet Take 1 tablet by mouth every 6 (six) hours as needed for moderate pain. 120 tablet 0  . hydroxychloroquine (PLAQUENIL) 200 MG tablet Take 200 mg by mouth daily.     . Multiple  Vitamins-Calcium (ONE-A-DAY WOMENS PO) Take 1 tablet by mouth daily.    . polyethylene glycol powder (GLYCOLAX/MIRALAX) powder Take 8.5-17 g by mouth at bedtime.     Derrill Memo ON 11/16/2018] promethazine-codeine (PHENERGAN WITH CODEINE) 6.25-10 MG/5ML syrup Take 5 mLs by mouth every 6 (six) hours as needed. (Patient taking differently: Take 5 mLs by mouth every 6 (six) hours as needed (for IBS). ) 240 mL 0  . saccharomyces boulardii (FLORASTOR) 250 MG capsule Take 250 mg by mouth daily.    Marland Kitchen tobramycin-dexamethasone (TOBRADEX) ophthalmic solution Place 1 drop into the right eye 4 (four) times daily.    . fluticasone (FLONASE) 50 MCG/ACT nasal spray Place 2 sprays into both nostrils 2 (two) times daily as needed. For allergies (Patient not taking: Reported on 09/16/2018) 48 g 3  . ranitidine (ZANTAC) 150 MG tablet Take 1 tablet (150 mg total) by mouth 2 (two) times daily. (Patient not taking: Reported on 09/16/2018) 180 tablet 3    Results for orders placed or performed during the hospital encounter of 09/29/18 (from the past 48 hour(s))  Glucose, capillary     Status: Abnormal   Collection Time: 09/29/18  8:34 AM  Result Value Ref Range   Glucose-Capillary 130 (H) 70 - 99 mg/dL   Dg Chest 2 View  Result Date: 09/27/2018 CLINICAL DATA:  Sore throat since Friday. EXAM: CHEST - 2 VIEW COMPARISON:  07/13/2015 FINDINGS: The heart size and mediastinal contours are within normal limits. Both lungs are clear. The visualized skeletal structures are unremarkable. IMPRESSION: No active cardiopulmonary disease. Electronically Signed   By: Kerby Moors M.D.   On: 09/27/2018 11:07    Pertinent items noted in HPI and remainder of comprehensive ROS otherwise negative.  Blood pressure (!) 186/60, pulse 66, temperature 97.6 F (36.4 C), temperature source Oral, resp. rate 16, height 5' 2.5" (1.588 m), weight 76.7 kg, SpO2 97 %.  Patient is awake and alert.  She is oriented and appropriate.  Speech is fluent.   Judgment insight intact.  Cranial nerve function normal bilateral.  Motor examination intact sensory examination nonfocal.  Deep tender mixes hypoactive but symmetric.  No evidence of long track signs.  Gait antalgic.  Posture moderately flexed.  Examination head ears eyes and throat is unremarkable her chest and abdomen are benign.  Extremities are free from injury deformity. Assessment/Plan L4-L5 grade 1 degenerative spondylolisthesis with stenosis and neurogenic claudication.  Plan bilateral L4-5 decompressive laminotomies and foraminotomies followed by posterior lumbar interbody fusion utilizing interbody cages, locally harvested autograft, and augmented with posterior lateral arthrodesis utilizing nonsegmental pedicle screw fixation and local autograft.  Risks and benefits been  explained.  Patient wishes to proceed.  Ganesh Deeg A Cleland Simkins 09/29/2018, 10:06 AM

## 2018-09-30 LAB — GLUCOSE, CAPILLARY
Glucose-Capillary: 143 mg/dL — ABNORMAL HIGH (ref 70–99)
Glucose-Capillary: 136 mg/dL — ABNORMAL HIGH (ref 70–99)

## 2018-09-30 MED ORDER — DIAZEPAM 5 MG PO TABS: 5.0000 mg | ORAL_TABLET | Freq: Four times a day (QID) | ORAL | 0 refills | Status: DC | PRN

## 2018-09-30 MED ORDER — HYDROCODONE-ACETAMINOPHEN 10-325 MG PO TABS: 1.0000 | ORAL_TABLET | ORAL | 0 refills | Status: AC | PRN

## 2018-09-30 MED FILL — Thrombin (Recombinant) For Soln 20000 Unit: CUTANEOUS | Qty: 1 | Status: AC

## 2018-09-30 NOTE — Discharge Summary (Signed)
Physician Discharge Summary  Patient ID: Deborah Bryant MRN: [id number] DOB/AGE: 1946/07/14 72 y.o.  Admit date: 09/29/2018 Discharge date: 09/30/2018  Admission Diagnoses:  Discharge Diagnoses:  Active Problems:   Degenerative spondylolisthesis   Discharged Condition: good  Hospital Course: Patient admitted to the hospital where she underwent uncomplicated U7-2 decompression and fusion.  Postoperatively doing well.  Preoperative back and radicular pain improved.  Standing and walking without difficulty.  Ready for discharge home.  Consults:   Significant Diagnostic Studies:   Treatments:   Discharge Exam: Blood pressure 135/78, pulse 80, temperature 98 F (36.7 C), temperature source Oral, resp. rate 16, height 5' 2.5" (1.588 m), weight 76.7 kg, SpO2 98 %. Patient awake and alert.  Oriented and appropriate.  Cranial nerve function intact.  Motor and sensory function extremities normal.  Wound clean and dry.  Chest and abdomen benign.  Disposition: Discharge disposition: 01-Home or Self Care        Allergies as of 09/30/2018      Reactions   Losartan Shortness Of Breath   Benazepril Cough   Lasix [furosemide] Other (See Comments)   Rash, changes in eyesight   Levaquin [levofloxacin] Other (See Comments)   Tendon and muscle pain   Phenobarbital Nausea And Vomiting   Tape Rash   Can use paper tape      Medication List    TAKE these medications   allopurinol 300 MG tablet Commonly known as:  ZYLOPRIM Take 1 tablet (300 mg total) by mouth daily.   aspirin EC 81 MG tablet Take 81 mg by mouth daily.   atorvastatin 40 MG tablet Commonly known as:  LIPITOR Take 1 tablet (40 mg total) by mouth daily.   diazepam 5 MG tablet Commonly known as:  VALIUM Take 1-2 tablets (5-10 mg total) by mouth every 6 (six) hours as needed for muscle spasms. What changed:    how much to take  when to take this  reasons to take this  additional instructions   diclofenac  75 MG EC tablet Commonly known as:  VOLTAREN Take 1 tablet (75 mg total) by mouth 2 (two) times daily as needed for moderate pain. What changed:  when to take this   diclofenac sodium 1 % Gel Commonly known as:  VOLTAREN Apply 1 application topically at bedtime.   diltiazem 240 MG 24 hr capsule Commonly known as:  CARDIZEM CD Take 1 capsule (240 mg total) by mouth daily.   famotidine 10 MG tablet Commonly known as:  PEPCID Take 10 mg by mouth 2 (two) times daily.   fluticasone 50 MCG/ACT nasal spray Commonly known as:  FLONASE Place 2 sprays into both nostrils 2 (two) times daily as needed. For allergies   glipiZIDE 5 MG 24 hr tablet Commonly known as:  GLUCOTROL XL TAKE ONE (1) TABLET BY MOUTH EACH DAY What changed:  See the new instructions.   hydrochlorothiazide 25 MG tablet Commonly known as:  HYDRODIURIL Take 1 tablet (25 mg total) by mouth daily.   HYDROcodone-acetaminophen 10-325 MG tablet Commonly known as:  NORCO Take 1-2 tablets by mouth every 4 (four) hours as needed for moderate pain. What changed:    how much to take  when to take this   hydroxychloroquine 200 MG tablet Commonly known as:  PLAQUENIL Take 200 mg by mouth daily.   ONE-A-DAY WOMENS PO Take 1 tablet by mouth daily.   polyethylene glycol powder powder Commonly known as:  GLYCOLAX/MIRALAX Take 8.5-17 g by mouth at bedtime.  promethazine-codeine 6.25-10 MG/5ML syrup Commonly known as:  PHENERGAN with CODEINE Take 5 mLs by mouth every 6 (six) hours as needed. Start taking on:  11/16/2018 What changed:  reasons to take this   ranitidine 150 MG tablet Commonly known as:  ZANTAC Take 1 tablet (150 mg total) by mouth 2 (two) times daily.   saccharomyces boulardii 250 MG capsule Commonly known as:  FLORASTOR Take 250 mg by mouth daily.   tobramycin-dexamethasone ophthalmic solution Commonly known as:  TOBRADEX Place 1 drop into the right eye 4 (four) times daily.   vitamin C 1000 MG  tablet Take 1,000 mg by mouth daily.   Vitamin D 2000 units Caps Take 2,000 Units by mouth daily.            Durable Medical Equipment  (From admission, onward)         Start     Ordered   09/29/18 1527  DME Walker rolling  Once    Question:  Patient needs a walker to treat with the following condition  Answer:  Degenerative spondylolisthesis   09/29/18 1526   09/29/18 1527  DME 3 n 1  Once     09/29/18 1526           Signed: Mallie Mussel A Raguel Kosloski 09/30/2018, 10:06 AM

## 2018-09-30 NOTE — Progress Notes (Signed)
Pt doing well. Pt and daughter given D/C instructions with Rx's, verbal understanding was provided. Pt's incision is clean and dry with no sign of infection. Pt's IV was removed prior to D/C. Pt D/C'd home via wheelchair @ 1245 per MD order. Pt is stable @ D/C and has no other needs at this time. Holli Humbles, RN

## 2018-09-30 NOTE — Evaluation (Signed)
Occupational Therapy Evaluation Patient Details Name: Deborah Bryant MRN: [id number] DOB: [birth year] Today's Date: 09/30/2018    History of Present Illness Pt is a 72 y/o female who presents s/p L4-L5 PLIF on 09/29/18. PMH significant for MRSA, HTN, DM, COPD.   Clinical Impression   Pt was admitted for the above sx.  All education was completed this session. Pt was independent with adls/iadls at baseline and she helped her husband when he had back sx in the past.  She is mostly at a supervision level. Educated on toilet aide options if needed and also educated to think about body mechanics during her activities as it is easy to get into an automatic routine. She has a housekeeper once a week that can help with IADLs and husband can reach to floor, do grocery shopping etc.    Follow Up Recommendations  No OT follow up    Equipment Recommendations  None recommended by OT    Recommendations for Other Services       Precautions / Restrictions Precautions Precautions: Fall;Back Precaution Booklet Issued: Yes (comment) Precaution Comments: Reviewed in detail. Pt was able to recall precautions well but was cued for maintenance during functional mobility.  Required Braces or Orthoses: Spinal Brace Spinal Brace: Lumbar corset;Applied in sitting position Restrictions Weight Bearing Restrictions: No      Mobility Bed Mobility Overal bed mobility: Needs Assistance Bed Mobility: Rolling;Sidelying to Sit;Sit to Sidelying Rolling: Modified independent (Device/Increase time) Sidelying to sit: Supervision     Sit to sidelying: Supervision General bed mobility comments: cues needed for log roll only; timing for hips and shoulders  Transfers Overall transfer level: Needs assistance Equipment used: None Transfers: Sit to/from Stand Sit to Stand: Supervision         General transfer comment: Light supervision for safety. Pt was cued to minimize bend in back during transition to/from  sitting.     Balance Overall balance assessment: Mild deficits observed, not formally tested                                         ADL either performed or assessed with clinical judgement   ADL Overall ADL's : Needs assistance/impaired Eating/Feeding: Independent   Grooming: Supervision/safety   Upper Body Bathing: Supervision/ safety   Lower Body Bathing: Supervison/ safety   Upper Body Dressing : Supervision/safety   Lower Body Dressing: Supervision/safety   Toilet Transfer: Supervision/safety;Ambulation(bed)   Toileting- Clothing Manipulation and Hygiene: Minimal assistance;Sit to/from stand(simulated)   Tub/ Shower Transfer: Walk-in shower;Supervision/safety     General ADL Comments: pt reports high pain this am (7-8).  Simulated ADL; did not want to perform at this time. She is able to cross legs for adls.  Asked for ice pack when back to bed.  Doffed brace and states that she has donned it before.  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 Educated to not over do it and to think about each task she will do until she develops new habits.  Educated on toilet aide vs using scrubbing bubbles wand with toilet paper as needed for hygiene.  Pt has grab bars in shower.  She did not want to simulate this.  She is familiar with AE but doesn't need it at this time. Pt also has a housekeeper 1x week.  This person can do more for her as needed (laundry; bring heavier groceries  in etc).  Pt was primary driver at home.  She needs supervision to gather items; husband can bend to bring shoes (slip on) and take bath mat up.     Vision         Perception     Praxis      Pertinent Vitals/Pain Pain Assessment: 0-10 Pain Score: 8  Faces Pain Scale: Hurts a little bit Pain Location: Pt reporting she has uncontrolled pain and has been struggling with uncontrolled pain since her surgery. Overall pt appears comfortable, and is casually ambulating and talking  without difficulty. When this was mentioned to the pt, she states "I am pushing through it" Pain Descriptors / Indicators: Operative site guarding Pain Intervention(s): Limited activity within patient's tolerance;Monitored during session;Premedicated before session     Hand Dominance     Extremity/Trunk Assessment Upper Extremity Assessment Upper Extremity Assessment: Overall WFL for tasks assessed      Cervical / Trunk Assessment Cervical / Trunk Assessment: Other exceptions Cervical / Trunk Exceptions: s/p surgery   Communication Communication Communication: No difficulties   Cognition Arousal/Alertness: Awake/alert Behavior During Therapy: WFL for tasks assessed/performed Overall Cognitive Status: Within Functional Limits for tasks assessed                                     General Comments       Exercises     Shoulder Instructions      Home Living Family/patient expects to be discharged to:: Private residence Living Arrangements: Spouse/significant other Available Help at Discharge: Family;Available 24 hours/day Type of Home: House Home Access: Stairs to enter [employer] of Steps: 1   Home Layout: One level     Bathroom Shower/Tub: Walk-in shower;Door   Bathroom Toilet: Standard (medium; not super low). She doesn't feel she needs DME over it     Home Equipment: Walker - 2 wheels;Cane - single point;Shower seat - built in;Grab bars - tub/shower          Prior Functioning/Environment Level of Independence: Independent                 OT Problem List:        OT Treatment/Interventions:      OT Goals(Current goals can be found in the care plan section) Acute Rehab OT Goals Patient Stated Goal: Control her pain OT Goal Formulation: All assessment and education complete, DC therapy  OT Frequency:     Barriers to D/C:            Co-evaluation              AM-PAC PT "6 Clicks" Daily Activity     Outcome  Measure Help from another person eating meals?: None Help from another person taking care of personal grooming?: A Little Help from another person toileting, which includes using toliet, bedpan, or urinal?: A Little Help from another person bathing (including washing, rinsing, drying)?: A Little Help from another person to put on and taking off regular upper body clothing?: A Little Help from another person to put on and taking off regular lower body clothing?: A Little 6 Click Score: 19   End of Session Nurse Communication: (OT education completed)  Activity Tolerance: Patient limited by pain Patient left: in bed;with call bell/phone within reach  OT Visit Diagnosis: Muscle weakness (generalized) (M62.81)  Time: 7096-2836 OT Time Calculation (min): 20 min Charges:  OT General Charges $OT Visit: 1 Visit OT Evaluation $OT Eval Low Complexity: [place], OTR/L Acute Rehabilitation Services [phone] [facility] pager [phone] office 09/30/2018  [place] 09/30/2018, 8:47 AM

## 2018-09-30 NOTE — Evaluation (Signed)
Physical Therapy Evaluation Patient Details Name: Deborah Bryant MRN: [id number] DOB: 1946/04/17 Today's Date: 09/30/2018   History of Present Illness  Pt is a 72 y/o female who presents s/p L4-L5 PLIF on 09/29/18. PMH significant for MRSA, HTN, DM, COPD.  Clinical Impression  Pt admitted with above diagnosis. Pt currently with functional limitations due to the deficits listed below (see PT Problem List). At the time of PT eval pt was able to perform transfers and ambulation with gross supervision for safety. Pt complaining of severe pain, however does not appear to be limited by pain during session. She was educated on safe activity progression, brace application/wearing schedule, and maintenance of precautions with functional mobility. Pt will benefit from skilled PT to increase their independence and safety with mobility to allow discharge to the venue listed below.      Follow Up Recommendations No PT follow up;Supervision for mobility/OOB    Equipment Recommendations  None recommended by PT    Recommendations for Other Services       Precautions / Restrictions Precautions Precautions: Fall;Back Precaution Booklet Issued: Yes (comment) Precaution Comments: Reviewed in detail. Pt was able to recall precautions well but was cued for maintenance during functional mobility.  Required Braces or Orthoses: Spinal Brace Spinal Brace: Lumbar corset;Applied in sitting position Restrictions Weight Bearing Restrictions: No      Mobility  Bed Mobility Overal bed mobility: Needs Assistance Bed Mobility: Rolling;Sidelying to Sit;Sit to Sidelying Rolling: Modified independent (Device/Increase time) Sidelying to sit: Supervision     Sit to sidelying: Supervision General bed mobility comments: (P) cues needed for log roll only; timing for hips and shoulders  Transfers Overall transfer level: Needs assistance Equipment used: None Transfers: (P) Sit to/from Stand Sit to Stand:  Supervision         General transfer comment: Light supervision for safety. Pt was cued to minimize bend in back during transition to/from sitting.   Ambulation/Gait Ambulation/Gait assistance: Supervision Gait Distance (Feet): 300 Feet Assistive device: None Gait Pattern/deviations: Step-through pattern;Decreased stride length;Trunk flexed Gait velocity: Decreased Gait velocity interpretation: 1.31 - 2.62 ft/sec, indicative of limited community ambulator General Gait Details: VC's for general safety. Pt slightly guarded at times but overall appeared casual and comfortable.   Stairs            Wheelchair Mobility    Modified Rankin (Stroke Patients Only)       Balance Overall balance assessment: Mild deficits observed, not formally tested                                           Pertinent Vitals/Pain Pain Assessment: 0-10 Pain Score: 8  Faces Pain Scale: Hurts a little bit Pain Location: Pt reporting she has uncontrolled pain and has been struggling with uncontrolled pain since her surgery. Overall pt appears comfortable, and is casually ambulating and talking without difficulty. When this was mentioned to the pt, she states "I am pushing through it" Pain Descriptors / Indicators: Operative site guarding Pain Intervention(s): Limited activity within patient's tolerance;Monitored during session;Premedicated before session    Home Living Family/patient expects to be discharged to:: Private residence Living Arrangements: Spouse/significant other Available Help at Discharge: Family;Available 24 hours/day Type of Home: House Home Access: Stairs to enter   [employer] of Steps: 1 Home Layout: One level Home Equipment: Walker - 2 wheels;Cane - single point;Shower seat - built in;Grab  bars - tub/shower      Prior Function Level of Independence: Independent               Hand Dominance        Extremity/Trunk Assessment   Upper  Extremity Assessment Upper Extremity Assessment: Overall WFL for tasks assessed    Lower Extremity Assessment Lower Extremity Assessment: Overall WFL for tasks assessed    Cervical / Trunk Assessment Cervical / Trunk Assessment: Other exceptions Cervical / Trunk Exceptions: s/p surgery  Communication   Communication: No difficulties  Cognition Arousal/Alertness: Awake/alert Behavior During Therapy: WFL for tasks assessed/performed Overall Cognitive Status: Within Functional Limits for tasks assessed                                        General Comments      Exercises     Assessment/Plan    PT Assessment Patient needs continued PT services  PT Problem List Decreased strength;Decreased range of motion;Decreased activity tolerance;Decreased mobility;Decreased balance;Decreased cognition;Decreased knowledge of use of DME;Decreased safety awareness;Decreased knowledge of precautions;Pain       PT Treatment Interventions DME instruction;Gait training;Stair training;Functional mobility training;Therapeutic activities;Therapeutic exercise;Neuromuscular re-education;Patient/family education    PT Goals (Current goals can be found in the Care Plan section)  Acute Rehab PT Goals Patient Stated Goal: Control her pain PT Goal Formulation: With patient Time For Goal Achievement: 10/07/18 Potential to Achieve Goals: Good    Frequency Min 5X/week   Barriers to discharge        Co-evaluation               AM-PAC PT "6 Clicks" Daily Activity  Outcome Measure Difficulty turning over in bed (including adjusting bedclothes, sheets and blankets)?: None Difficulty moving from lying on back to sitting on the side of the bed? : A Little Difficulty sitting down on and standing up from a chair with arms (e.g., wheelchair, bedside commode, etc,.)?: A Little Help needed moving to and from a bed to chair (including a wheelchair)?: A Little Help needed walking in  hospital room?: A Little Help needed climbing 3-5 steps with a railing? : A Little 6 Click Score: 19    End of Session Equipment Utilized During Treatment: Gait belt Activity Tolerance: Patient tolerated treatment well Patient left: with call bell/phone within reach;Other (comment)(Sitting on EOB with OT) Nurse Communication: Mobility status PT Visit Diagnosis: Pain;Other abnormalities of gait and mobility (R26.89) Pain - part of body: (back)    Time: 1517-6160 PT Time Calculation (min) (ACUTE ONLY): 22 min   Charges:   PT Evaluation $PT Eval Moderate Complexity: 1 Mod          Rolinda Roan, PT, DPT Acute Rehabilitation Services Pager: [phone] Office: [phone]   Thelma Comp 09/30/2018, 8:46 AM

## 2018-09-30 NOTE — Discharge Instructions (Signed)

## 2018-10-01 MED FILL — Sodium Chloride IV Soln 0.9%: INTRAVENOUS | Qty: 1000 | Status: AC

## 2018-10-01 MED FILL — Heparin Sodium (Porcine) Inj 1000 Unit/ML: INTRAMUSCULAR | Qty: 30 | Status: AC

## 2018-10-05 ENCOUNTER — Encounter: Payer: Self-pay | Admitting: Family Medicine

## 2018-10-05 DIAGNOSIS — N182 Chronic kidney disease, stage 2 (mild): Secondary | ICD-10-CM

## 2018-10-05 NOTE — Telephone Encounter (Signed)
Dr. Fry please advise. Thanks  

## 2018-10-06 NOTE — Telephone Encounter (Signed)
The last set of numbers may have beedn affected by dehydration. Let's just repeat a BMET one day this week. I will put in the order and she can make a lab appt.

## 2018-10-07 ENCOUNTER — Other Ambulatory Visit (INDEPENDENT_AMBULATORY_CARE_PROVIDER_SITE_OTHER): Payer: Medicare Other

## 2018-10-07 DIAGNOSIS — N182 Chronic kidney disease, stage 2 (mild): Secondary | ICD-10-CM | POA: Diagnosis not present

## 2018-10-07 LAB — BASIC METABOLIC PANEL
BUN: 16 mg/dL (ref 6–23)
CHLORIDE: 100 meq/L (ref 96–112)
CO2: 31 mEq/L (ref 19–32)
CREATININE: 0.85 mg/dL (ref 0.40–1.20)
Calcium: 9.4 mg/dL (ref 8.4–10.5)
GFR: 69.85 mL/min (ref >60.00)
Glucose, Bld: 164 mg/dL — ABNORMAL HIGH (ref 70–99)
POTASSIUM: 3.5 meq/L (ref 3.5–5.1)
Sodium: 138 mEq/L (ref 135–145)

## 2018-10-28 ENCOUNTER — Encounter: Payer: Self-pay | Admitting: Family Medicine

## 2018-10-28 ENCOUNTER — Ambulatory Visit: Payer: Medicare Other | Admitting: Family Medicine

## 2018-10-28 VITALS — BP 134/70 | HR 63 | Temp 97.4°F | Wt 165.1 lb

## 2018-10-28 DIAGNOSIS — R29818 Other symptoms and signs involving the nervous system: Secondary | ICD-10-CM

## 2018-10-28 DIAGNOSIS — R531 Weakness: Secondary | ICD-10-CM | POA: Diagnosis not present

## 2018-10-28 LAB — CBC WITH DIFFERENTIAL/PLATELET
BASOS ABS: 0.1 10*3/uL (ref 0.0–0.1)
BASOS PCT: 1.1 % (ref 0.0–3.0)
EOS ABS: 0.1 10*3/uL (ref 0.0–0.7)
Eosinophils Relative: 0.6 % (ref 0.0–5.0)
HCT: 41.3 % (ref 36.0–46.0)
Hemoglobin: 14 g/dL (ref 12.0–15.0)
Lymphocytes Relative: 10.1 % — ABNORMAL LOW (ref 12.0–46.0)
Lymphs Abs: 1.1 10*3/uL (ref 0.7–4.0)
MCHC: 34 g/dL (ref 30.0–36.0)
MCV: 91 fl (ref 78.0–100.0)
Monocytes Absolute: 0.8 10*3/uL (ref 0.1–1.0)
Monocytes Relative: 7.6 % (ref 3.0–12.0)
NEUTROS ABS: 8.6 10*3/uL — AB (ref 1.4–7.7)
NEUTROS PCT: 80.6 % — AB (ref 43.0–77.0)
Platelets: 435 10*3/uL — ABNORMAL HIGH (ref 150.0–400.0)
RBC: 4.54 Mil/uL (ref 3.87–5.11)
RDW: 14 % (ref 11.5–15.5)
WBC: 10.6 10*3/uL — ABNORMAL HIGH (ref 4.0–10.5)

## 2018-10-28 LAB — BASIC METABOLIC PANEL
BUN: 14 mg/dL (ref 6–23)
CALCIUM: 10.3 mg/dL (ref 8.4–10.5)
CO2: 31 meq/L (ref 19–32)
CREATININE: 0.82 mg/dL (ref 0.40–1.20)
Chloride: 101 mEq/L (ref 96–112)
GFR: 72.8 mL/min (ref >60.00)
GLUCOSE: 94 mg/dL (ref 70–99)
Potassium: 4.2 mEq/L (ref 3.5–5.1)
Sodium: 138 mEq/L (ref 135–145)

## 2018-10-28 NOTE — Progress Notes (Signed)
   Subjective:    Patient ID: Deborah Bryant, female    DOB: 1946/11/21, 72 y.o.   MRN: [id number]  HPI Here with concerns about a loss of smell and a loss of taste that she noticed a few weeks ago. She also notes some mild unsteadiness on her feet, some confusion and memory trouble, and some weakness all over. No focal neurologic deficits. No headache or nausea. Her appetite is poor. Of note on 09-29-18 she underwent a lumbar decompression and fusion surgery. She has been slowly improving as far as back pain.   Review of Systems  Constitutional: Positive for appetite change.  Respiratory: Negative.   Cardiovascular: Negative.   Gastrointestinal: Negative.   Genitourinary: Negative.   Neurological: Positive for dizziness and weakness. Negative for tremors, seizures, syncope, facial asymmetry, speech difficulty, light-headedness, numbness and headaches.       Objective:   Physical Exam  Constitutional: She is oriented to person, place, and time. She appears well-developed and well-nourished. No distress.  Cardiovascular: Normal rate, regular rhythm, normal heart sounds and intact distal pulses.  Pulmonary/Chest: Effort normal and breath sounds normal.  Neurological: She is alert and oriented to person, place, and time. No cranial nerve deficit. She exhibits normal muscle tone. Coordination normal.          Assessment & Plan:  She having weakness, dizziness, loss of appetite, and a los of smell and taste. I think it likely that all this is the result of general anesthesia with her surgery. Hopefully this will improve over time. In the meantime we will get some labs and set up a brain MRI. She is to follow up with Dr. Trenton Gammon on 10-13-18. Alysia Penna, MD

## 2018-11-01 ENCOUNTER — Ambulatory Visit
Admission: RE | Admit: 2018-11-01 | Discharge: 2018-11-01 | Disposition: A | Discharge status: Home / Self Care | Payer: Medicare Other | Source: Ambulatory Visit | Attending: Family Medicine | Admitting: Family Medicine

## 2018-11-01 DIAGNOSIS — R29818 Other symptoms and signs involving the nervous system: Secondary | ICD-10-CM

## 2018-11-01 MED ORDER — GADOBENATE DIMEGLUMINE 529 MG/ML IV SOLN
15.0000 mL | Freq: Once | INTRAVENOUS | Status: AC | PRN
  Administered 2018-11-01: 15 mL via INTRAVENOUS

## 2018-11-02 ENCOUNTER — Emergency Department (HOSPITAL_BASED_OUTPATIENT_CLINIC_OR_DEPARTMENT_OTHER)
Admission: EM | Admit: 2018-11-02 | Discharge: 2018-11-02 | Disposition: A | Discharge status: Home / Self Care | Payer: Medicare Other | Attending: Emergency Medicine | Admitting: Emergency Medicine

## 2018-11-02 ENCOUNTER — Emergency Department (HOSPITAL_BASED_OUTPATIENT_CLINIC_OR_DEPARTMENT_OTHER): Payer: Medicare Other

## 2018-11-02 ENCOUNTER — Encounter (HOSPITAL_BASED_OUTPATIENT_CLINIC_OR_DEPARTMENT_OTHER): Payer: Self-pay | Admitting: Emergency Medicine

## 2018-11-02 ENCOUNTER — Other Ambulatory Visit: Payer: Self-pay

## 2018-11-02 DIAGNOSIS — Z7982 Long term (current) use of aspirin: Secondary | ICD-10-CM | POA: Insufficient documentation

## 2018-11-02 DIAGNOSIS — Z7984 Long term (current) use of oral hypoglycemic drugs: Secondary | ICD-10-CM | POA: Insufficient documentation

## 2018-11-02 DIAGNOSIS — E119 Type 2 diabetes mellitus without complications: Secondary | ICD-10-CM | POA: Diagnosis not present

## 2018-11-02 DIAGNOSIS — J449 Chronic obstructive pulmonary disease, unspecified: Secondary | ICD-10-CM | POA: Diagnosis not present

## 2018-11-02 DIAGNOSIS — Z79899 Other long term (current) drug therapy: Secondary | ICD-10-CM | POA: Insufficient documentation

## 2018-11-02 DIAGNOSIS — F17210 Nicotine dependence, cigarettes, uncomplicated: Secondary | ICD-10-CM | POA: Insufficient documentation

## 2018-11-02 DIAGNOSIS — R42 Dizziness and giddiness: Secondary | ICD-10-CM | POA: Insufficient documentation

## 2018-11-02 DIAGNOSIS — I129 Hypertensive chronic kidney disease with stage 1 through stage 4 chronic kidney disease, or unspecified chronic kidney disease: Secondary | ICD-10-CM | POA: Insufficient documentation

## 2018-11-02 DIAGNOSIS — E039 Hypothyroidism, unspecified: Secondary | ICD-10-CM | POA: Insufficient documentation

## 2018-11-02 DIAGNOSIS — R5383 Other fatigue: Secondary | ICD-10-CM | POA: Insufficient documentation

## 2018-11-02 DIAGNOSIS — N182 Chronic kidney disease, stage 2 (mild): Secondary | ICD-10-CM | POA: Diagnosis not present

## 2018-11-02 LAB — URINALYSIS, MICROSCOPIC (REFLEX)

## 2018-11-02 LAB — HEPATIC FUNCTION PANEL
ALK PHOS: 131 U/L — AB (ref 38–126)
ALT: 25 U/L (ref 0–44)
AST: 23 U/L (ref 15–41)
Albumin: 4.6 g/dL (ref 3.5–5.0)
BILIRUBIN DIRECT: 0.1 mg/dL (ref 0.0–0.2)
BILIRUBIN INDIRECT: 0.2 mg/dL — AB (ref 0.3–0.9)
BILIRUBIN TOTAL: 0.3 mg/dL (ref 0.3–1.2)
Total Protein: 7.8 g/dL (ref 6.5–8.1)

## 2018-11-02 LAB — CBC
HCT: 45.6 % (ref 36.0–46.0)
Hemoglobin: 14.6 g/dL (ref 12.0–15.0)
MCH: 29.6 pg (ref 26.0–34.0)
MCHC: 32 g/dL (ref 30.0–36.0)
MCV: 92.3 fL (ref 80.0–100.0)
NRBC: 0 % (ref 0.0–0.2)
Platelets: 387 10*3/uL (ref 150–400)
RBC: 4.94 MIL/uL (ref 3.87–5.11)
RDW: 13.2 % (ref 11.5–15.5)
WBC: 10.5 10*3/uL (ref 4.0–10.5)

## 2018-11-02 LAB — URINALYSIS, ROUTINE W REFLEX MICROSCOPIC
Bilirubin Urine: NEGATIVE
GLUCOSE, UA: NEGATIVE mg/dL
Hgb urine dipstick: NEGATIVE
Ketones, ur: NEGATIVE mg/dL
LEUKOCYTES UA: NEGATIVE
NITRITE: NEGATIVE
PROTEIN: 100 mg/dL — AB
Specific Gravity, Urine: 1.02 (ref 1.005–1.030)
pH: 7 (ref 5.0–8.0)

## 2018-11-02 LAB — BASIC METABOLIC PANEL
ANION GAP: 11 (ref 5–15)
BUN: 15 mg/dL (ref 8–23)
CALCIUM: 9.9 mg/dL (ref 8.9–10.3)
CO2: 26 mmol/L (ref 22–32)
Chloride: 99 mmol/L (ref 98–111)
Creatinine, Ser: 0.8 mg/dL (ref 0.44–1.00)
GFR calc Af Amer: >60 mL/min (ref >60)
GLUCOSE: 129 mg/dL — AB (ref 70–99)
Potassium: 3.4 mmol/L — ABNORMAL LOW (ref 3.5–5.1)
Sodium: 136 mmol/L (ref 135–145)

## 2018-11-02 LAB — CK: Total CK: 114 U/L (ref 38–234)

## 2018-11-02 LAB — LIPASE, BLOOD: Lipase: 43 U/L (ref 11–51)

## 2018-11-02 LAB — TSH: TSH: 3.576 u[IU]/mL (ref 0.350–4.500)

## 2018-11-02 LAB — AMMONIA: AMMONIA: 28 umol/L (ref 9–35)

## 2018-11-02 NOTE — ED Provider Notes (Signed)
[facility] EMERGENCY DEPARTMENT Provider Note   CSN: [id number] Arrival date & time: 11/02/18  1013     History   Chief Complaint Chief Complaint  Patient presents with  . Dizziness    HPI Deborah Bryant is a 72 y.o. female.  The history is provided by the patient, the spouse and medical records. No language interpreter was used.  Illness  This is a new (severe fatigue) problem. The current episode started more than 1 week ago. The problem occurs constantly. The problem has been gradually worsening. Pertinent negatives include no chest pain, no abdominal pain, no headaches and no shortness of breath. Nothing aggravates the symptoms. Nothing relieves the symptoms. She has tried nothing for the symptoms. The treatment provided no relief.    Past Medical History:  Diagnosis Date  . Allergic rhinitis   . Anxiety   . Arthritis    sees Dr. Gavin Pound   . Cellulitis, periorbital 2006   left eye, due to MRSA, saw Dr. Erik Obey   . Chronic kidney disease   . Complication of anesthesia   . COPD (chronic obstructive pulmonary disease) ([place])   . Cough   . Diabetes mellitus   . Dyspnea    with exertion  . GERD (gastroesophageal reflux disease)    OTC  Pepcid  . Hemorrhoids   . Hyperlipidemia   . Hypertension   . IBS (irritable bowel syndrome)   . Menopause   . 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 Pneumonia   . PONV (postoperative nausea and vomiting)   . Tubular adenoma of colon 12/2006    Patient Active Problem List   Diagnosis Date Noted  . Degenerative spondylolisthesis 09/29/2018  . Chronic cough 09/09/2018  . Low back pain 06/09/2018  . Shoulder pain, bilateral 03/13/2018  . CKD (chronic kidney disease) stage 2, GFR 60-89 ml/min 07/03/2017  . IBS (irritable bowel syndrome) 07/03/2017  . Gout of multiple sites 08/01/2015  . Vitamin D deficiency 08/01/2015  . RLL pneumonia ([place]) 06/26/2015  . Sepsis ([place]) 06/26/2015   . Mixed simple and mucopurulent chronic bronchitis ([place]) 06/11/2015  . Chorioretinal scar, 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  . Nuclear cataract, bilateral 06/16/2014  . Smoker 09/07/2010  . HEMATURIA UNSPECIFIED 09/07/2010  . EDEMA- LOCALIZED 08/02/2008  . Hypothyroidism 03/29/2008  . ANEMIA 03/29/2008  . Arthropathy 01/07/2008  . Gout 10/07/2007  . Diabetes mellitus without complication ([place]) 56/43/3295  . Hyperlipemia 07/21/2007  . Essential hypertension 07/21/2007    Past Surgical History:  Procedure Laterality Date  . APPENDECTOMY    . BACK SURGERY    . COLONOSCOPY  04-01-12   per Dr. Fuller Plan, tubular adenoma, repeat in 5 yrs   . POLYPECTOMY    . TUBAL LIGATION       OB History   None      Home Medications    Prior to Admission medications   Medication Sig Start Date End Date Taking? Authorizing Provider  allopurinol (ZYLOPRIM) 300 MG tablet Take 1 tablet (300 mg total) by mouth daily. 08/07/18   Laurey Morale, MD  Ascorbic Acid (VITAMIN C) 1000 MG tablet Take 1,000 mg by mouth daily.    [provider]  aspirin EC 81 MG tablet Take 81 mg by mouth daily.    [provider]  atorvastatin (LIPITOR) 40 MG tablet Take 1 tablet (40 mg total) by mouth daily. 08/07/18 08/07/19  Laurey Morale, MD  Cholecalciferol (VITAMIN D) 2000 units CAPS  Take 2,000 Units by mouth daily.     [provider]  diazepam (VALIUM) 5 MG tablet Take 1-2 tablets (5-10 mg total) by mouth every 6 (six) hours as needed for muscle spasms. 09/30/18   Earnie Larsson, MD  diclofenac (VOLTAREN) 75 MG EC tablet Take 1 tablet (75 mg total) by mouth 2 (two) times daily as needed for moderate pain. Patient taking differently: Take 75 mg by mouth daily.  08/07/18   Laurey Morale, MD  diclofenac sodium (VOLTAREN) 1 % GEL Apply 1 application topically at bedtime.    [provider]  diltiazem (DILTIAZEM CD) 240 MG 24 hr capsule Take 1 capsule (240 mg total) by mouth daily. 08/07/18   Laurey Morale,  MD  famotidine (PEPCID) 10 MG tablet Take 10 mg by mouth 2 (two) times daily.    [provider]  fluticasone (FLONASE) 50 MCG/ACT nasal spray Place 2 sprays into both nostrils 2 (two) times daily as needed. For allergies Patient not taking: Reported on 09/16/2018 08/01/15 09/16/18  Laurey Morale, MD  glipiZIDE (GLUCOTROL XL) 5 MG 24 hr tablet TAKE ONE (1) TABLET BY MOUTH EACH DAY Patient taking differently: Take 5 mg by mouth daily with breakfast.  05/05/18   Laurey Morale, MD  hydrochlorothiazide (HYDRODIURIL) 25 MG tablet Take 1 tablet (25 mg total) by mouth daily. 08/07/18   Laurey Morale, MD  hydroxychloroquine (PLAQUENIL) 200 MG tablet Take 200 mg by mouth daily.     [provider]  Multiple Vitamins-Calcium (ONE-A-DAY WOMENS PO) Take 1 tablet by mouth daily.    [provider]  polyethylene glycol powder (GLYCOLAX/MIRALAX) powder Take 8.5-17 g by mouth at bedtime.     [provider]  promethazine-codeine (PHENERGAN WITH CODEINE) 6.25-10 MG/5ML syrup Take 5 mLs by mouth every 6 (six) hours as needed. Patient taking differently: Take 5 mLs by mouth every 6 (six) hours as needed (for IBS).  11/16/18 12/16/18  Laurey Morale, MD  saccharomyces boulardii (FLORASTOR) 250 MG capsule Take 250 mg by mouth daily.    [provider]    Family History Family History  Problem Relation Age of Onset  . Alcohol abuse Mother   . Diabetes Mother   . Heart disease Father   . Heart disease Brother     Social History Social History   Tobacco Use  . Smoking status: Current Every Day Smoker    Packs/day: 1.00    Years: 1.00    Pack years: 1.00    Types: Cigarettes  . Smokeless tobacco: Never Used  Substance Use Topics  . Alcohol use: Yes    Alcohol/week: 0.0 standard drinks    Comment: glass of wine daily  . Drug use: No     Allergies   Losartan; Benazepril; Lasix [furosemide]; Levaquin [levofloxacin]; Phenobarbital; and Tape   Review of  Systems Review of Systems  Constitutional: Positive for fatigue. Negative for chills, diaphoresis and fever.  HENT: Negative for congestion.   Eyes: Negative for photophobia and visual disturbance.  Respiratory: Negative for cough, chest tightness, shortness of breath, wheezing and stridor.   Cardiovascular: Negative for chest pain, palpitations and leg swelling.  Gastrointestinal: Negative for abdominal pain, constipation, diarrhea, nausea and vomiting.  Genitourinary: Negative for dysuria, flank pain and frequency.  Musculoskeletal: Positive for back pain (chronic after sutgery). Negative for neck pain and neck stiffness.  Skin: Positive for wound (wel appearing). Negative for rash.  Neurological: Positive for light-headedness. Negative for dizziness, seizures,  weakness, numbness and headaches.  Psychiatric/Behavioral: Negative for agitation.  All other systems reviewed and are negative.    Physical Exam Updated Vital Signs BP 140/80   Pulse 70   Temp 98.2 F (36.8 C)   Resp 16   Ht 5\' 2"  (1.575 m)   Wt 74.9 kg   SpO2 98%   BMI 30.19 kg/m   Physical Exam  Constitutional: She is oriented to person, place, and time. She appears well-developed and well-nourished. No distress.  HENT:  Head: Normocephalic and atraumatic.  Nose: Nose normal.  Mouth/Throat: Oropharynx is clear and moist. No oropharyngeal exudate.  Eyes: Pupils are equal, round, and reactive to light. Conjunctivae and EOM are normal.  Neck: Normal range of motion. Neck supple.  Cardiovascular: Normal rate, regular rhythm and intact distal pulses.  No murmur heard. Pulmonary/Chest: Effort normal and breath sounds normal. No respiratory distress. She has no wheezes. She has no rales. She exhibits no tenderness.  Abdominal: Soft. Bowel sounds are normal. She exhibits no distension. There is no tenderness. There is no rebound and no guarding.  Musculoskeletal: She exhibits no edema or tenderness.  Lymphadenopathy:     She has no cervical adenopathy.  Neurological: She is alert and oriented to person, place, and time. She displays normal reflexes. No cranial nerve deficit or sensory deficit. She exhibits normal muscle tone.  Skin: Skin is warm and dry. Capillary refill takes less than 2 seconds. No rash noted. She is not diaphoretic. No erythema.  Psychiatric: She has a normal mood and affect.  Nursing note and vitals reviewed.    ED Treatments / Results  Labs (all labs ordered are listed, but only abnormal results are displayed) Labs Reviewed  BASIC METABOLIC PANEL - Abnormal; Notable for the following components:      Result Value   Potassium 3.4 (*)    Glucose, Bld 129 (*)    All other components within normal limits  URINALYSIS, ROUTINE W REFLEX MICROSCOPIC - Abnormal; Notable for the following components:   Protein, ur 100 (*)    All other components within normal limits  URINALYSIS, MICROSCOPIC (REFLEX) - Abnormal; Notable for the following components:   Bacteria, UA MANY (*)    All other components within normal limits  HEPATIC FUNCTION PANEL - Abnormal; Notable for the following components:   Alkaline Phosphatase 131 (*)    Indirect Bilirubin 0.2 (*)    All other components within normal limits  URINE CULTURE  CBC  AMMONIA  LIPASE, BLOOD  CK  TSH    EKG EKG Interpretation  Date/Time:  Monday November 02 2018 12:41:16 EST Ventricular Rate:  62 PR Interval:    QRS Duration: 98 QT Interval:  424 QTC Calculation: 431 R Axis:   44 Text Interpretation:  Sinus rhythm When compared to prior, no significant chagnes seen.  No STEMI Confirmed by Antony Blackbird [phone]) on 11/02/2018 2:17:29 PM   Radiology Dg Chest 2 View  Result Date: 11/02/2018 CLINICAL DATA:  Weakness and dizziness for 3 weeks. EXAM: CHEST - 2 VIEW COMPARISON:  09/27/2018 FINDINGS: The cardiac silhouette, mediastinal and hilar contours are normal. The lungs are clear. No pleural effusions. The bony thorax is  intact. IMPRESSION: No acute cardiopulmonary findings. Electronically Signed   By: Marijo Sanes M.D.   On: 11/02/2018 13:22   Dg Lumbar Spine Complete  Result Date: 11/02/2018 CLINICAL DATA:  Recent L4-5 fusion procedure. Weakness, increased pain and difficulty walking. EXAM: LUMBAR SPINE - COMPLETE 4+ VIEW COMPARISON:  MRI  lumbar spine 05/14/2018. Postoperative spot films 09/29/2018 FINDINGS: L4-5 posterior and interbody fusion hardware appears stable. No complicating features are identified. Stable advanced degenerate disc disease at the other intervertebral disc space levels with moderate discogenic sclerosis at L5-S1. No acute bony findings. The visualized bony pelvis is intact. The sacrum and SI joints appear normal. The lung bases are grossly clear. Moderate atherosclerotic calcifications involving the lower aorta and iliac arteries. No aneurysm. IMPRESSION: 1. L4-5 fusion hardware appears to be in good position without complicating features. 2. Stable advanced disc disease and facet disease in the lumbar spine. Electronically Signed   By: Marijo Sanes M.D.   On: 11/02/2018 13:25   Mr Jeri Cos HD Contrast  Result Date: 11/02/2018 CLINICAL DATA:  Anosmia. Balance disturbance for 3 weeks following lumbar spine surgery. EXAM: MRI HEAD WITHOUT AND WITH CONTRAST TECHNIQUE: Multiplanar, multiecho pulse sequences of the brain and surrounding structures were obtained without and with intravenous contrast. CONTRAST:  53mL MULTIHANCE GADOBENATE DIMEGLUMINE 529 MG/ML IV SOLN COMPARISON:  Head CT 06/10/2015 FINDINGS: BRAIN: There is no acute infarct, acute hemorrhage, hydrocephalus or extra-axial collection. The midline structures are normal. No midline shift or other mass effect. 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 Multifocal white matter hyperintensity, most commonly due to chronic ischemic microangiopathy. The cerebral and cerebellar volume are age-appropriate. Susceptibility-sensitive sequences show no  chronic microhemorrhage or superficial siderosis. Normal appearance of the olfactory bulbs and olfactory nerve branches. No dehiscence of the cribriform plate is visible. Olfactory recesses are symmetric. No abnormal contrast enhancement. VASCULAR: Major intracranial arterial and venous sinus flow voids are normal. SKULL AND UPPER CERVICAL SPINE: Calvarial bone marrow signal is normal. There is no skull base mass. Visualized upper cervical spine and soft tissues are normal. SINUSES/ORBITS: No fluid levels or advanced mucosal thickening. No mastoid or middle ear effusion. The orbits are normal. IMPRESSION: 1. Normal appearance of the olfactory bulbs and CN 1 cisternal segments. 2. No cribriform plate dehiscence or abnormality of the olfactory recesses. 3. White matter changes of chronic small vessel disease. Electronically Signed   By: Ulyses Jarred M.D.   On: 11/02/2018 01:30    Procedures Procedures (including critical care time)  Medications Ordered in ED Medications - No data to display   Initial Impression / Assessment and Plan / ED Course  I have reviewed the triage vital signs and the nursing notes.  Pertinent labs & imaging results that were available during my care of the patient were reviewed by me and considered in my medical decision making (see chart for details).     Deborah Bryant is a 72 y.o. female with a past medical history significant for diabetes, hypertension, hyperlipidemia, COPD, GERD, chronic kidney disease, documentation of hypothyroidism but patient does not appear to be on Synthroid, chronic anemia, and had lumbar surgery 1 month ago who presents with generalized weakness, fatigue, malaise, and no energy.  Patient reports that 1 month ago she had lumbar spine surgery and has been following up as directed.  She reports she is been taking Norco as prescribed but over the last 3 weeks has had worsening lightheadedness and fatigue.  She says that she is get to the point where  she has difficulty walking due to feeling so tired.  She says this is new.  She reports that she has had no significant increase in low back pain and denies any loss of bowel or bladder control.  No incontinence.  No new numbness or tingling in the legs.  No  unilateral weakness of the legs.  She reports that her overall weakness is causing the difficulty with ambulation.  She says that she saw her PCP yesterday and outlined all these symptoms including recent loss of smell and taste.  Patient had MRI of her head showing small vessel white matter disease but no evidence of stroke or other abnormality with her nose.  Patient has no history of DVT or PE and denies any unilateral leg pain or leg swelling.  She reports that she may have "try to overdo it" and is concerned that something may be wrong with her back despite no significant worsening of back pain.  Patient requesting x-ray of her lumbar spine as she is post to have it coming up later this week and is concerned about it.  She reports that she has had no significant fevers, chills, cough, congestion, URI symptoms or urinary symptoms.  No constipation or diarrhea.  No other trauma.  No other complaints on arrival.  On exam, patient's midline low back's surgical scar appears intact.  There is minimal tenderness.  No significant erythema or drainage.  Patient has normal sensation and strength in bilateral legs.  Normal pulses present.  No significant bruising or color change seen.  Patient has no focal neurologic deficit on initial exam.  Due to the reported lightheadedness, initially gait was deferred.  Patient will have this reassessed before discharge or disposition.  She did have some crackles in the bases of her lungs.  Chest and back are nontender.  No murmur.  Patient otherwise appears well.  Abdomen nontender.  Clinical suspect patient may be dehydrated versus other metabolic or occult infection cause of her symptoms.  She will have urinalysis chest  x-ray and laboratory testing.  She will have a TSH checked as there is documentation of prior hypothyroidism and she does not appear to be taking Synthroid.  Patient will be given fluids as her mucous membranes are dry and I am concerned about dehydration despite having concern for possible mild fluid in the lungs.  Anticipate reassessment after work-up.  Given her lack of focal neurologic deficits and her MRI yesterday showing no stroke, do not feel she needs head imaging at this time.  Patient's diagnostic work-up was overall reassuring.  Potassium slightly low at 3.4.  Urinalysis showed protein which she appears to chronically have in the urine.  No nitrites or leukocytes, doubt UTI.  TSH is still in process and due to it being a different facility, will not come back in her usual timeframe.  Patient will follow-up with this result on my chart and with PCP.  CBC and CMP otherwise reassuring.  X-ray shows no pneumonia, lumbar x-ray shows no problem with hardware.  CK was not elevated.  Given reassuring work-up, we feel patient is safe for discharge home.  Patient reports that she was hypothyroid and was on Synthroid when she was in her 1s but then stopped taking it and never had a problem with it.  Patient is scheduled to see her back surgeon this week and will call her PCP for close follow-up.  Patient was able to ambulate without difficulty and with normal stability.   Patient discharged in good condition after reassuring work-up for continued outpatient work-up of her fatigue and malaise.   Final Clinical Impressions(s) / ED Diagnoses   Final diagnoses:  Lightheadedness  Fatigue, unspecified type    ED Discharge Orders    None      Clinical Impression: 1. Lightheadedness  2. Fatigue, unspecified type     Disposition: Discharge  Condition: Good  I have discussed the results, Dx and Tx plan with the pt(& family if present). He/she/they expressed understanding and agree(s) with  the plan. Discharge instructions discussed at great length. Strict return precautions discussed and pt &/or family have verbalized understanding of the instructions. No further questions at time of discharge.    New Prescriptions   No medications on file    Follow Up: Laurey Morale, MD [place] [id number] [phone]     Your back surgeon        Cari Burgo, Gwenyth Allegra, MD 11/02/18 [phone]

## 2018-11-02 NOTE — Discharge Instructions (Signed)
Your work-up today was overall reassuring.  We did not discover an infectious cause of your new symptoms.  Your laboratory testing was reassuring.  You had very mild low potassium.  Please continue eating and drinking and staying hydrated.  Please follow-up with your back doctor and your primary doctor.  Please look and see the results of your thyroid level.  If it is significantly abnormal, please follow-up in the nearest emergency department.  If any symptoms change or worsen, please return to the nearest emergency department.  Please stay hydrated

## 2018-11-02 NOTE — ED Triage Notes (Signed)
Pt having dizziness and weakness for three weeks.  Pt post-op fusion on Oct 2019.  Pt states she cannot go on.  Pt states she feels weaker, having difficulty walking.  Pt went to PCP and had MRI ordered but symptoms are progressing.

## 2018-11-03 ENCOUNTER — Encounter: Payer: Self-pay | Admitting: *Deleted

## 2018-11-03 LAB — URINE CULTURE

## 2018-11-16 ENCOUNTER — Ambulatory Visit: Payer: Medicare Other | Admitting: Family Medicine

## 2018-11-16 ENCOUNTER — Encounter: Payer: Self-pay | Admitting: Family Medicine

## 2018-11-16 VITALS — BP 122/66 | HR 70 | Temp 97.7°F | Wt 164.1 lb

## 2018-11-16 DIAGNOSIS — R5383 Other fatigue: Secondary | ICD-10-CM

## 2018-11-16 DIAGNOSIS — R430 Anosmia: Secondary | ICD-10-CM | POA: Diagnosis not present

## 2018-11-16 NOTE — Progress Notes (Signed)
   Subjective:    Patient ID: Deborah Bryant, female    DOB: Apr 18, 1946, 72 y.o.   MRN: [id number]  HPI Here to follow up an ER visit on 11-02-18 for lightheadedness and fatigue. This has been a problem for about 4 weeks. All tests that day were unremarkable including labs, EKG, and CXR. No explanation was found. She feels about the same today. Of course she had spinal surgery about 6-7 weeks ago.    Review of Systems  Constitutional: Positive for fatigue.  Respiratory: Negative.   Cardiovascular: Negative.   Neurological: Positive for light-headedness.       Objective:   Physical Exam  Constitutional: She is oriented to person, place, and time. She appears well-developed and well-nourished.  Cardiovascular: Normal rate, regular rhythm, normal heart sounds and intact distal pulses.  Pulmonary/Chest: Effort normal and breath sounds normal. No stridor. No respiratory distress. She has no wheezes. She has no rales.  Neurological: She is alert and oriented to person, place, and time.          Assessment & Plan:  Her fatigue is likely due to a combination of factors, and it is very likely that these could be delayed effects from the anesthesia she received. If so, it may take more time for this to improve. In case it could be an effect of her Lipitor, we asked her to stop this for one month. Recheck in 3-4 weeks. Alysia Penna, MD

## 2018-12-04 ENCOUNTER — Other Ambulatory Visit: Payer: Self-pay | Admitting: Family Medicine

## 2018-12-04 ENCOUNTER — Ambulatory Visit: Payer: Medicare Other | Admitting: Family Medicine

## 2018-12-04 ENCOUNTER — Encounter: Payer: Self-pay | Admitting: Family Medicine

## 2018-12-04 VITALS — BP 140/70 | HR 75 | Temp 98.1°F | Wt 164.1 lb

## 2018-12-04 DIAGNOSIS — R05 Cough: Secondary | ICD-10-CM

## 2018-12-04 DIAGNOSIS — R053 Chronic cough: Secondary | ICD-10-CM

## 2018-12-04 DIAGNOSIS — G8929 Other chronic pain: Secondary | ICD-10-CM | POA: Diagnosis not present

## 2018-12-04 DIAGNOSIS — M545 Low back pain, unspecified: Secondary | ICD-10-CM

## 2018-12-04 DIAGNOSIS — F1190 Opioid use, unspecified, uncomplicated: Secondary | ICD-10-CM

## 2018-12-04 MED ORDER — HYDROCODONE-ACETAMINOPHEN 10-325 MG PO TABS: 1.0000 | ORAL_TABLET | Freq: Four times a day (QID) | ORAL | 0 refills | Status: DC | PRN

## 2018-12-04 MED ORDER — PROMETHAZINE-CODEINE 6.25-10 MG/5ML PO SYRP: 5.0000 mL | ORAL_SOLUTION | Freq: Four times a day (QID) | ORAL | 0 refills | Status: DC | PRN

## 2018-12-04 NOTE — Telephone Encounter (Signed)
Copied from [place] [phone]. Topic: Quick Communication - Rx Refill/Question >> Dec 04, 2018  1:12 PM Judyann Munson wrote: Medication: HYDROcodone-Acetaminophen 10-325 MG   Promethazine-Codeine 6.25-10 MG/5ML    Has the patient contacted their pharmacy?yes   Preferred Pharmacy (with phone number or street name): [place], [place] - [address] [phone] (Phone) [phone] (Fax)    Agent: Please be advised that RX refills may take up to 3 business days. We ask that you follow-up with your pharmacy.

## 2018-12-04 NOTE — Progress Notes (Signed)
   Subjective:    Patient ID: Deborah Bryant, female    DOB: [birth year], 72 y.o.   MRN: [id number]  HPI Here for pain management and cough management. She is doing well.  Indication for chronic opioid: low back pain and chronic cough  Medication and dose: Norco 10-325 and Promethazine/codeine 6.25-10/5 ml  # pills per month: 120 and 240 ml  Last UDS date: 03-13-18 Opioid Treatment Agreement signed (Y/N): 03-13-18 Opioid Treatment Agreement last reviewed with patient:  12-04-18 [place] reviewed this encounter (include red flags):  12-04-18     Review of Systems  Constitutional: Negative.   Respiratory: Positive for cough.   Cardiovascular: Negative.   Musculoskeletal: Positive for back pain.  Neurological: Negative.        Objective:   Physical Exam Constitutional:      Appearance: Normal appearance.  Cardiovascular:     Rate and Rhythm: Normal rate and regular rhythm.     Pulses: Normal pulses.     Heart sounds: Normal heart sounds.  Pulmonary:     Effort: Pulmonary effort is normal.     Breath sounds: Normal breath sounds.  Neurological:     General: No focal deficit present.     Mental Status: She is alert and oriented to person, place, and time.           Assessment & Plan:  Pain and cough management, meds were refilled.  Alysia Penna, MD

## 2019-01-04 ENCOUNTER — Other Ambulatory Visit: Payer: Self-pay | Admitting: Family Medicine

## 2019-01-05 ENCOUNTER — Encounter: Payer: Self-pay | Admitting: Family Medicine

## 2019-01-05 NOTE — Telephone Encounter (Signed)
Dr. Fry please advise on refill. Thanks  

## 2019-01-05 NOTE — Telephone Encounter (Signed)
Dr. Fry please advise. Thanks  

## 2019-01-06 MED ORDER — PROMETHAZINE-CODEINE 6.25-10 MG/5ML PO SYRP: 5.0000 mL | ORAL_SOLUTION | Freq: Four times a day (QID) | ORAL | 0 refills | Status: DC | PRN

## 2019-01-06 NOTE — Telephone Encounter (Signed)
I guess they simply did not go through. I just sent in refills for Jan and Feb

## 2019-01-14 DIAGNOSIS — I10 Essential (primary) hypertension: Secondary | ICD-10-CM | POA: Diagnosis not present

## 2019-01-14 DIAGNOSIS — M4310 Spondylolisthesis, site unspecified: Secondary | ICD-10-CM | POA: Diagnosis not present

## 2019-01-14 DIAGNOSIS — Z6830 Body mass index (BMI) 30.0-30.9, adult: Secondary | ICD-10-CM | POA: Diagnosis not present

## 2019-01-20 ENCOUNTER — Ambulatory Visit: Payer: Medicare Other | Attending: Neurosurgery | Admitting: Physical Therapy

## 2019-01-20 ENCOUNTER — Other Ambulatory Visit: Payer: Self-pay

## 2019-01-20 ENCOUNTER — Encounter: Payer: Self-pay | Admitting: Physical Therapy

## 2019-01-20 DIAGNOSIS — M6281 Muscle weakness (generalized): Secondary | ICD-10-CM | POA: Diagnosis not present

## 2019-01-20 DIAGNOSIS — M5441 Lumbago with sciatica, right side: Secondary | ICD-10-CM | POA: Diagnosis not present

## 2019-01-20 DIAGNOSIS — M5442 Lumbago with sciatica, left side: Secondary | ICD-10-CM | POA: Insufficient documentation

## 2019-01-20 DIAGNOSIS — R29898 Other symptoms and signs involving the musculoskeletal system: Secondary | ICD-10-CM | POA: Diagnosis not present

## 2019-01-20 DIAGNOSIS — R2689 Other abnormalities of gait and mobility: Secondary | ICD-10-CM | POA: Diagnosis not present

## 2019-01-20 DIAGNOSIS — G8929 Other chronic pain: Secondary | ICD-10-CM | POA: Diagnosis not present

## 2019-01-20 DIAGNOSIS — R262 Difficulty in walking, not elsewhere classified: Secondary | ICD-10-CM | POA: Insufficient documentation

## 2019-01-20 DIAGNOSIS — M545 Low back pain: Secondary | ICD-10-CM | POA: Insufficient documentation

## 2019-01-20 NOTE — Therapy (Signed)
[facility] [address]  [place], [place], [address] Phone: [phone]   Fax:  [phone]  Physical Therapy Evaluation  Patient Details  Name: Deborah Bryant MRN: [id number] Date of Birth: 04-24-1946 Referring Provider (PT): Earnie Larsson, MD   Encounter Date: 01/20/2019  PT End of Session - 01/20/19 1449    Visit Number  1    Number of Visits  9    Date for PT Re-Evaluation  03/17/19    Authorization Type  Medicare & BCBS    PT Start Time  1352    PT Stop Time  1443    PT Time Calculation (min)  51 min    Equipment Utilized During Treatment  Gait belt    Activity Tolerance  Patient tolerated treatment well    Behavior During Therapy  [facility] for tasks assessed/performed       Past Medical History:  Diagnosis Date  . Allergic rhinitis   . Anxiety   . Arthritis    sees Dr. Gavin Pound   . Cellulitis, periorbital 2006   left eye, due to MRSA, saw Dr. Erik Obey   . Chronic kidney disease   . Complication of anesthesia   . COPD (chronic obstructive pulmonary disease) ([place])   . Cough   . Diabetes mellitus   . Dyspnea    with exertion  . GERD (gastroesophageal reflux disease)    OTC  Pepcid  . Hemorrhoids   . Hyperlipidemia   . Hypertension   . IBS (irritable bowel syndrome)   . Menopause   . 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 Pneumonia   . PONV (postoperative nausea and vomiting)   . Tubular adenoma of colon 12/2006    Past Surgical History:  Procedure Laterality Date  . APPENDECTOMY    . BACK SURGERY    . COLONOSCOPY  04-01-12   per Dr. Fuller Plan, tubular adenoma, repeat in 5 yrs   . POLYPECTOMY    . TUBAL LIGATION      There were no vitals filed for this visit.   Subjective Assessment - 01/20/19 1351    Subjective  Patient reports that she underwent L4-5 fusion on 09/29/18. Reports that 2 weeks after surgery she became weak, low energy, fuzzy-brained, had  imbalance, and loss of taste and smell. MRI of brain clear. Reports that she believes she had a bad reaction to anesthesia. Has recently gone back to the gym with very light weights. Reports back pain is improved compared to pre-surgery. Still a little remaining pain in the surgical area, but reports she also has RA which causes "general LBP." Worse with lifting, mopping, vacuuming. Better with heat, rest. Denies N/T.      Pertinent History  pneumonia, MRSA 2006, HTN, HLD, GERD, DM, dyspnea, COPD, CKD, L periorbital cellulitis, RA    Limitations  Sitting;Lifting;Standing;Walking;House hold activities    How long can you sit comfortably?  2 hours    How long can you stand comfortably?  15-20 min    How long can you walk comfortably?  45 min    Diagnostic tests  11/02/18 lumbar xray: L4-5 fusion hardware appears to be in good position without complicating features. Stable advanced disc disease and facet disease in the lumbar spine.    Patient Stated Goals  "strengthen these legs, more endurance"    Currently in Pain?  Yes    Pain Score  1  Pain Location  Back    Pain Orientation  Lower;Right;Left    Pain Descriptors / Indicators  Aching;Dull    Pain Type  Surgical pain;Chronic pain         [facility] PT Assessment - 01/20/19 1411      Assessment   Medical Diagnosis  Spondylolisthesis    Referring Provider (PT)  Earnie Larsson, MD    Onset Date/Surgical Date  09/29/18    Next MD Visit  --   not scheduled   Prior Therapy  No      Precautions   Precautions  --   L4-5 fusion     Restrictions   Weight Bearing Restrictions  No      Balance Screen   Has the patient fallen in the past 6 months  No    Has the patient had a decrease in activity level because of a fear of falling?   No    Is the patient reluctant to leave their home because of a fear of falling?   No      Home Film/video editor residence    Living Arrangements  Spouse/significant other    Available  Help at Discharge  Family    Type of [place] to enter    Entrance Stairs-Number of Steps  threshold    [place]  One level    [place] - 2 wheels;[place] - single point      Prior Function   Level of Independence  Independent    Vocation  Retired    Leisure  traveling, gym      Cognition   Overall Cognitive Status  Within Functional Limits for tasks assessed      Observation/Other Assessments   Observations  well healed incision at L4-5    Focus on Therapeutic Outcomes (FOTO)   Lumbar: 53 (47% limited, 39% predicted)      Sensation   Light Touch  Appears Intact      Coordination   Gross Motor Movements are Fluid and Coordinated  Yes      Posture/Postural Control   Posture/Postural Control  Postural limitations    Postural Limitations  Rounded Shoulders;Forward head;Posterior pelvic tilt      ROM / Strength   AROM / PROM / Strength  Strength      Strength   Strength Assessment Site  Hip;Knee;Ankle    Right/Left Hip  Right;Left    Right Hip Flexion  4+/5    Right Hip ABduction  4+/5    Right Hip ADduction  4/5    Left Hip Flexion  4+/5    Left Hip ABduction  4+/5    Left Hip ADduction  4/5    Right/Left Knee  Right;Left    Right Knee Flexion  4/5    Right Knee Extension  4+/5    Left Knee Flexion  4+/5    Left Knee Extension  4+/5    Right/Left Ankle  Right;Left    Right Ankle Dorsiflexion  4+/5    Right Ankle Plantar Flexion  4+/5    Left Ankle Dorsiflexion  4/5    Left Ankle Plantar Flexion  4+/5      Flexibility   Soft Tissue Assessment /Muscle Length  yes    Hamstrings  B mildly tight    Quadriceps  B moderately tight   tested in mod thomas   ITB  B mildly tight  Palpation   Spinal mobility  no TTP along spine    Palpation comment  TTP and increased tone in B thoracolumbar parapsinals and lateral glutes; L>R      Ambulation/Gait   Assistive device  None    Gait Pattern  Step-through  pattern;Antalgic;Lateral trunk lean to left   hard hitting at heel strike on B LEs, slightly ataxic   Ambulation Surface  Level;Indoor    Gait velocity  slightly decreased    Stairs  Yes    Stairs Assistance  6: Modified independent (Device/Increase time)    Stair Management Technique  One rail Right;One rail Left    Number of Stairs  13    Height of Stairs  8    Gait Comments  stomping ascending and descending and poor eccentric control      Balance   Balance Assessed  Yes      Standardized Balance Assessment   Standardized Balance Assessment  Dynamic Gait Index      Dynamic Gait Index   Level Surface  Normal    Change in Gait Speed  Normal    Gait with Horizontal Head Turns  Mild Impairment    Gait with Vertical Head Turns  Normal    Gait and Pivot Turn  Normal    Step Over Obstacle  Mild Impairment    Step Around Obstacles  Normal    Steps  Mild Impairment    Total Score  21    DGI comment:  no AD      High Level Balance   High Level Balance Comments  R SLS: 7 sec, L SLS: 3 sec                Objective measurements completed on examination: See above findings.              PT Education - 01/20/19 1449    Education Details  prognosis, POC, HEP    Person(s) Educated  Patient    Methods  Explanation;Demonstration;Tactile cues;Verbal cues;Handout    Comprehension  Verbalized understanding;Returned demonstration       PT Short Term Goals - 01/20/19 1457      PT SHORT TERM GOAL #1   Title  Patient to be independent with initial HEP.    Time  4    Period  Weeks    Status  New    Target Date  02/17/19        PT Long Term Goals - 01/20/19 1457      PT LONG TERM GOAL #1   Title  Patient to be independent with advanced HEP.    Time  8    Period  Weeks    Status  New    Target Date  03/17/19      PT LONG TERM GOAL #2   Title  Patient to demonstrate mild tightness in B HS, TFL, and hip flexor/quad.     Time  8    Period  Weeks    Status   New    Target Date  03/17/19      PT LONG TERM GOAL #3   Title  Patient to demonstrate 50% improvement in symmetrical weight shift, decreased lateral trunk lean, and improved fluidity of stepping with ambulation.     Time  8    Period  Weeks    Status  New    Target Date  03/17/19      PT LONG TERM GOAL #4   Title  Patient to demonstrate 75% improvement in LE control and stability with ascending/descending 13 steps with 1 handrail.     Time  8    Period  Weeks    Status  New    Target Date  03/17/19      PT LONG TERM GOAL #5   Title  Patient to demonstrate SLS for 30 sec on each LE without LOB.    Time  8    Status  New    Target Date  03/17/19             Plan - 01/20/19 1450    Clinical Impression Statement  Patient is a 73y/o F presenting to OPPT with c/o imbalance, LE weakness, and LBP s/p posterior L4-5 interbody fusion on 09/29/18. Reports that she and her MDs believe this has been d/t a bad reaction to anesthesia. Patient denies falls, but notes that she feels off balance and her gait pattern has changed. Reports B LBP persists, but is much improved since surgery. Worse with lifting, mopping, vacuuming. Patient today with decent overall LE strength, decreased B LE flexibility, imbalance with tandem and SLS, gait deviations, and poor control with stair climbing. Patient scored 21/24 on DGI, indicating decreased risk of falls. Educated on gentle stretching and balance HEP and administered handout. Patient reported understanding. Would benefit from skilled PT services 1x/week for 8 weeks to address aforementioned impairments.     Clinical Presentation  Stable    Clinical Decision Making  Low    Rehab Potential  Good    Clinical Impairments Affecting Rehab Potential  pneumonia, MRSA 2006, HTN, HLD, GERD, DM, dyspnea, COPD, CKD, L periorbital cellulitis, RA    PT Frequency  1x / week    PT Duration  8 weeks    PT Treatment/Interventions  ADLs/Self Care Home  Management;Cryotherapy;Electrical Stimulation;Functional mobility training;Stair training;Gait training;Ultrasound;Moist Heat;Therapeutic activities;Therapeutic exercise;Balance training;Neuromuscular re-education;Patient/family education;Orthotic Fit/Training;Passive range of motion;Scar mobilization;Manual techniques;Dry needling;Energy conservation;Splinting;Taping;Vasopneumatic Device    PT Next Visit Plan  reassess HEP    Consulted and Agree with Plan of Care  Patient       Patient will benefit from skilled therapeutic intervention in order to improve the following deficits and impairments:  Abnormal gait, Decreased endurance, Decreased activity tolerance, Decreased strength, Pain, Difficulty walking, Decreased balance, Decreased range of motion, Improper body mechanics, Postural dysfunction, Impaired flexibility  Visit Diagnosis: Chronic bilateral low back pain without sciatica  Other abnormalities of gait and mobility  Other symptoms and signs involving the musculoskeletal system     Problem List Patient Active Problem List   Diagnosis Date Noted  . Degenerative spondylolisthesis 09/29/2018  . Chronic cough 09/09/2018  . Low back pain 06/09/2018  . Shoulder pain, bilateral 03/13/2018  . CKD (chronic kidney disease) stage 2, GFR 60-89 ml/min 07/03/2017  . IBS (irritable bowel syndrome) 07/03/2017  . Gout of multiple sites 08/01/2015  . Vitamin D deficiency 08/01/2015  . RLL pneumonia ([place]) 06/26/2015  . Sepsis ([place]) 06/26/2015  . Mixed simple and mucopurulent chronic bronchitis ([place]) 06/11/2015  . Chorioretinal scar, 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  . Nuclear cataract, bilateral 06/16/2014  . Smoker 09/07/2010  . HEMATURIA UNSPECIFIED 09/07/2010  . EDEMA- LOCALIZED 08/02/2008  . Hypothyroidism 03/29/2008  . ANEMIA 03/29/2008  . Arthropathy 01/07/2008  . Gout 10/07/2007  . Diabetes mellitus without complication ([place]) 20/25/4270  . Hyperlipemia 07/21/2007  . Essential hypertension  07/21/2007    Janene Harvey, PT, DPT 01/20/19 3:08 PM   [facility]  [address]  [place], [place], [address] Phone: [phone]   Fax:  [phone]  Name: Deborah Bryant MRN: [id number] Date of Birth: [birth year]

## 2019-01-27 ENCOUNTER — Ambulatory Visit: Payer: Medicare Other

## 2019-01-27 DIAGNOSIS — M5442 Lumbago with sciatica, left side: Secondary | ICD-10-CM | POA: Diagnosis not present

## 2019-01-27 DIAGNOSIS — G8929 Other chronic pain: Secondary | ICD-10-CM

## 2019-01-27 DIAGNOSIS — M545 Low back pain, unspecified: Secondary | ICD-10-CM

## 2019-01-27 DIAGNOSIS — R2689 Other abnormalities of gait and mobility: Secondary | ICD-10-CM

## 2019-01-27 DIAGNOSIS — R29898 Other symptoms and signs involving the musculoskeletal system: Secondary | ICD-10-CM | POA: Diagnosis not present

## 2019-01-27 DIAGNOSIS — M5441 Lumbago with sciatica, right side: Secondary | ICD-10-CM | POA: Diagnosis not present

## 2019-01-27 NOTE — Therapy (Signed)
[facility] [address]  [place], [place], [address] Phone: [phone]   Fax:  [phone]  Physical Therapy Treatment  Patient Details  Name: Deborah Bryant MRN: [id number] Date of Birth: Feb 11, 1946 Referring Provider (PT): Deborah Larsson, MD   Encounter Date: 01/27/2019  PT End of Session - 01/27/19 1026    Visit Number  2    Number of Visits  9    Date for PT Re-Evaluation  03/17/19    Authorization Type  Medicare & BCBS    PT Start Time  8099    PT Stop Time  1054    PT Time Calculation (min)  39 min    Equipment Utilized During Treatment  Gait belt    Activity Tolerance  Patient tolerated treatment well    Behavior During Therapy  WFL for tasks assessed/performed       Past Medical History:  Diagnosis Date  . Allergic rhinitis   . Anxiety   . Arthritis    sees Dr. Gavin Bryant   . Cellulitis, periorbital 2006   left eye, due to MRSA, saw Dr. Erik Bryant   . Chronic kidney disease   . Complication of anesthesia   . COPD (chronic obstructive pulmonary disease) ([place])   . Cough   . Diabetes mellitus   . Dyspnea    with exertion  . GERD (gastroesophageal reflux disease)    OTC  Pepcid  . Hemorrhoids   . Hyperlipidemia   . Hypertension   . IBS (irritable bowel syndrome)   . Menopause   . 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 Pneumonia   . PONV (postoperative nausea and vomiting)   . Tubular adenoma of colon 12/2006    Past Surgical History:  Procedure Laterality Date  . APPENDECTOMY    . BACK SURGERY    . COLONOSCOPY  04-01-12   per Dr. Fuller Bryant, tubular adenoma, repeat in 5 yrs   . POLYPECTOMY    . TUBAL LIGATION      There were no vitals filed for this visit.  Subjective Assessment - 01/27/19 1018    Subjective  Pt. reports she walked more yesterday which challanged her.  Denies soreness today.      Pertinent History  pneumonia, MRSA 2006, HTN, HLD, GERD, DM,  dyspnea, COPD, CKD, L periorbital cellulitis, RA    Diagnostic tests  11/02/18 lumbar xray: L4-5 fusion hardware appears to be in good position without complicating features. Stable advanced disc disease and facet disease in the lumbar spine.    Patient Stated Goals  "strengthen these legs, more endurance"    Currently in Pain?  Yes    Pain Score  1     Pain Location  Back    Pain Orientation  Lower;Right;Left    Pain Descriptors / Indicators  Aching;Dull    Pain Type  Surgical pain    Multiple Pain Sites  No                       [facility] Adult PT Treatment/Exercise - 01/27/19 1031      Lumbar Exercises: Supine   Clam  10 reps;3 seconds    Clam Limitations  + abdom bracing with red looped TB at knees       Knee/Hip Exercises: Stretches   Passive Hamstring Stretch  Right;Left;1 rep;30 seconds    Passive Hamstring Stretch Limitations  strap  Hip Flexor Stretch  Right;Left;1 rep;30 seconds    Hip Flexor Stretch Limitations  strap mod thomas position     ITB Stretch  Right;Left;30 seconds;1 rep    ITB Stretch Limitations  strap - min cueing for positioning to ensure proper stretch    Other Knee/Hip Stretches  SKTC stretch x 30 sec each side       Knee/Hip Exercises: Aerobic   Nustep  Lvl 3, 6 min (LE only)       Knee/Hip Exercises: Standing   Heel Raises  --    Heel Raises Limitations  --    Forward Step Up  Right;Left;1 set;10 reps;Step Height: 6";Hand Hold: 1    Forward Step Up Limitations  at TM rail     Functional Squat  10 reps;3 seconds    Functional Squat Limitations  at TM rail       Knee/Hip Exercises: Supine   Bridges  Both;10 reps      Knee/Hip Exercises: Sidelying   Clams  B sidelying clam shell with yellow TB at knees x 10 reps                PT Short Term Goals - 01/27/19 1029      PT SHORT TERM GOAL #1   Title  Patient to be independent with initial HEP.    Time  4    Period  Weeks    Status  On-going    Target Date  02/17/19         PT Long Term Goals - 01/27/19 1029      PT LONG TERM GOAL #1   Title  Patient to be independent with advanced HEP.    Time  8    Period  Weeks    Status  On-going      PT LONG TERM GOAL #2   Title  Patient to demonstrate mild tightness in B HS, TFL, and hip flexor/quad.     Time  8    Period  Weeks    Status  On-going      PT LONG TERM GOAL #3   Title  Patient to demonstrate 50% improvement in symmetrical weight shift, decreased lateral trunk lean, and improved fluidity of stepping with ambulation.     Time  8    Period  Weeks    Status  On-going      PT LONG TERM GOAL #4   Title  Patient to demonstrate 75% improvement in LE control and stability with ascending/descending 13 steps with 1 handrail.     Time  8    Period  Weeks    Status  On-going      PT LONG TERM GOAL #5   Title  Patient to demonstrate SLS for 30 sec on each LE without LOB.    Time  8    Status  On-going            Bryant - 01/27/19 1039    Clinical Impression Statement  Deborah Bryant reporting she made it through "most" of the HEP exercises without questions.  Able to review HEP with only minor technique correction for proper positioning with supine ITB stretch to ensure proper stretch.  Tolerated progression of lumbopelvic strengthening well today without issue.  ended visit pain free thus modalities deferred.  Will Bryant to update HEP in coming visit.      Clinical Impairments Affecting Rehab Potential  pneumonia, MRSA 2006, HTN, HLD, GERD, DM, dyspnea, COPD, CKD, L periorbital  cellulitis, RA    PT Treatment/Interventions  ADLs/Self Care Home Management;Cryotherapy;Electrical Stimulation;Functional mobility training;Stair training;Gait training;Ultrasound;Moist Heat;Therapeutic activities;Therapeutic exercise;Balance training;Neuromuscular re-education;Patient/family education;Orthotic Fit/Training;Passive range of motion;Scar mobilization;Manual techniques;Dry needling;Energy  conservation;Splinting;Taping;Vasopneumatic Device    Consulted and Agree with Bryant of Care  Patient       Patient will benefit from skilled therapeutic intervention in order to improve the following deficits and impairments:  Abnormal gait, Decreased endurance, Decreased activity tolerance, Decreased strength, Pain, Difficulty walking, Decreased balance, Decreased range of motion, Improper body mechanics, Postural dysfunction, Impaired flexibility  Visit Diagnosis: Chronic bilateral low back pain without sciatica  Other abnormalities of gait and mobility  Other symptoms and signs involving the musculoskeletal system     Problem List Patient Active Problem List   Diagnosis Date Noted  . Degenerative spondylolisthesis 09/29/2018  . Chronic cough 09/09/2018  . Low back pain 06/09/2018  . Shoulder pain, bilateral 03/13/2018  . CKD (chronic kidney disease) stage 2, GFR 60-89 ml/min 07/03/2017  . IBS (irritable bowel syndrome) 07/03/2017  . Gout of multiple sites 08/01/2015  . Vitamin D deficiency 08/01/2015  . RLL pneumonia ([place]) 06/26/2015  . Sepsis ([place]) 06/26/2015  . Mixed simple and mucopurulent chronic bronchitis ([place]) 06/11/2015  . Chorioretinal scar, 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  . Nuclear cataract, bilateral 06/16/2014  . Smoker 09/07/2010  . HEMATURIA UNSPECIFIED 09/07/2010  . EDEMA- LOCALIZED 08/02/2008  . Hypothyroidism 03/29/2008  . ANEMIA 03/29/2008  . Arthropathy 01/07/2008  . Gout 10/07/2007  . Diabetes mellitus without complication ([place]) 02/40/9735  . Hyperlipemia 07/21/2007  . Essential hypertension 07/21/2007    Bess Harvest, PTA 01/27/19 11:02 AM   [facility] [address]  [place], [place], [address] Phone: [phone]   Fax:  [phone]  Name: Deborah Bryant MRN: [id number] Date of Birth: 01-01-1946

## 2019-02-02 ENCOUNTER — Encounter: Payer: Self-pay | Admitting: Neurology

## 2019-02-02 ENCOUNTER — Ambulatory Visit (INDEPENDENT_AMBULATORY_CARE_PROVIDER_SITE_OTHER): Payer: Medicare Other | Admitting: Family Medicine

## 2019-02-02 ENCOUNTER — Encounter: Payer: Self-pay | Admitting: Family Medicine

## 2019-02-02 VITALS — BP 122/70 | HR 74 | Temp 98.1°F | Wt 168.1 lb

## 2019-02-02 DIAGNOSIS — R432 Parageusia: Secondary | ICD-10-CM

## 2019-02-02 DIAGNOSIS — I10 Essential (primary) hypertension: Secondary | ICD-10-CM

## 2019-02-02 DIAGNOSIS — E119 Type 2 diabetes mellitus without complications: Secondary | ICD-10-CM | POA: Diagnosis not present

## 2019-02-02 DIAGNOSIS — R430 Anosmia: Secondary | ICD-10-CM | POA: Diagnosis not present

## 2019-02-02 LAB — HEMOGLOBIN A1C: HEMOGLOBIN A1C: 6.2 % (ref 4.6–6.5)

## 2019-02-02 NOTE — Progress Notes (Signed)
   Subjective:    Patient ID: Deborah Bryant, female    DOB: [birth year], 73 y.o.   MRN: [id number]  HPI Here to check her BP and her diabetes, and to discuss her chronic loss of taste and smell. She feels well in general. We are assuming the loss of senses is an effect of anesthesia since this started 2 weeks after her back surgery. This is becoming quite frustrating to her.    Review of Systems  Constitutional: Negative.   Respiratory: Negative.   Cardiovascular: Negative.        Objective:   Physical Exam Constitutional:      Appearance: Normal appearance.  Cardiovascular:     Rate and Rhythm: Normal rate and regular rhythm.     Pulses: Normal pulses.     Heart sounds: Normal heart sounds.  Pulmonary:     Effort: Pulmonary effort is normal.     Breath sounds: Normal breath sounds.  Neurological:     General: No focal deficit present.     Mental Status: She is oriented to person, place, and time.           Assessment & Plan:  Her HTN is stable. check an A1c today. Refer to Neurology for the taste and smell problems.  Alysia Penna, MD

## 2019-02-03 ENCOUNTER — Ambulatory Visit: Payer: Medicare Other | Admitting: Physical Therapy

## 2019-02-03 ENCOUNTER — Encounter: Payer: Self-pay | Admitting: Physical Therapy

## 2019-02-03 DIAGNOSIS — R29898 Other symptoms and signs involving the musculoskeletal system: Secondary | ICD-10-CM | POA: Diagnosis not present

## 2019-02-03 DIAGNOSIS — R2689 Other abnormalities of gait and mobility: Secondary | ICD-10-CM

## 2019-02-03 DIAGNOSIS — M5441 Lumbago with sciatica, right side: Secondary | ICD-10-CM | POA: Diagnosis not present

## 2019-02-03 DIAGNOSIS — G8929 Other chronic pain: Secondary | ICD-10-CM | POA: Diagnosis not present

## 2019-02-03 DIAGNOSIS — M5442 Lumbago with sciatica, left side: Secondary | ICD-10-CM | POA: Diagnosis not present

## 2019-02-03 DIAGNOSIS — M545 Low back pain, unspecified: Secondary | ICD-10-CM

## 2019-02-03 NOTE — Therapy (Signed)
[facility] [address]  [place], [place], [address] Phone: [phone]   Fax:  [phone]  Physical Therapy Treatment  Patient Details  Name: Deborah Bryant MRN: [id number] Date of Birth: [birth year] Referring Provider (PT): Earnie Larsson, MD   Encounter Date: 02/03/2019  PT End of Session - 02/03/19 1145    Visit Number  3    Number of Visits  9    Date for PT Re-Evaluation  03/17/19    Authorization Type  Medicare & BCBS    PT Start Time  1101    PT Stop Time  1144    PT Time Calculation (min)  43 min    Activity Tolerance  Patient tolerated treatment well;Patient limited by pain    Behavior During Therapy  [facility] for tasks assessed/performed       Past Medical History:  Diagnosis Date  . Allergic rhinitis   . Anxiety   . Arthritis    sees Dr. Gavin Pound   . Cellulitis, periorbital 2006   left eye, due to MRSA, saw Dr. Erik Obey   . Chronic kidney disease   . Complication of anesthesia   . COPD (chronic obstructive pulmonary disease) ([place])   . Cough   . Diabetes mellitus   . Dyspnea    with exertion  . GERD (gastroesophageal reflux disease)    OTC  Pepcid  . Hemorrhoids   . Hyperlipidemia   . Hypertension   . IBS (irritable bowel syndrome)   . Menopause   . 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 Pneumonia   . PONV (postoperative nausea and vomiting)   . Tubular adenoma of colon 12/2006    Past Surgical History:  Procedure Laterality Date  . APPENDECTOMY    . BACK SURGERY    . COLONOSCOPY  04-01-12   per Dr. Fuller Plan, tubular adenoma, repeat in 5 yrs   . POLYPECTOMY    . TUBAL LIGATION      There were no vitals filed for this visit.  Subjective Assessment - 02/03/19 1101    Subjective  Reports that she has been trying to do light workouts at ths gym which has been going okay.     Pertinent History  pneumonia, MRSA 2006, HTN, HLD, GERD, DM, dyspnea, COPD, CKD,  L periorbital cellulitis, RA    Diagnostic tests  11/02/18 lumbar xray: L4-5 fusion hardware appears to be in good position without complicating features. Stable advanced disc disease and facet disease in the lumbar spine.    Patient Stated Goals  "strengthen these legs, more endurance"    Currently in Pain?  Yes    Pain Score  1     Pain Location  Shoulder    Pain Orientation  Right;Left    Pain Descriptors / Indicators  Aching    Pain Type  Chronic pain                       [facility] Adult PT Treatment/Exercise - 02/03/19 0001      Neuro Re-ed    Neuro Re-ed Details   tandem walking at counter top x4 length of coutner;       Exercises   Exercises  Lumbar      Lumbar Exercises: Stretches   ITB Stretch  Left;30 seconds;2 reps    ITB Stretch Limitations  supine with strap      Lumbar Exercises: Supine  Ab Set  10 reps    AB Set Limitations  TrA contraction 10x10" manual cues for proper palpation     Pelvic Tilt  10 reps    Pelvic Tilt Limitations  anterior/posterior tiliting    Bent Knee Raise  10 reps    Bent Knee Raise Limitations  TrA + alt marching   mild pain in L lateral hip   Dead Bug  10 reps    Dead Bug Limitations  x10 with orange ball on belly and cues for form   discontinued d/t shoulder pain   Other Supine Lumbar Exercises  overhead yellow medball lift x10 with cues to maintain posterior pelvic tilt      Knee/Hip Exercises: Stretches   Other Knee/Hip Stretches  L KTOS stretch 2x30"      Knee/Hip Exercises: Aerobic   Recumbent Bike  L3 x 48min      Knee/Hip Exercises: Standing   Heel Raises  Both;1 set;20 reps    Heel Raises Limitations  at counter top    Other Standing Knee Exercises  R & L single leg heel raise at counter top x10 each side   cues to avoid excess B UE support      Knee/Hip Exercises: Supine   Bridges with Diona Foley Squeeze  Strengthening;Both;1 set;10 reps    Bridges with Clamshell  Right;Both;1 set;10 reps   red TB around knees             PT Education - 02/03/19 1145    Education Details  update to HEP, administered handout on TrA contraction    Person(s) Educated  Patient    Methods  Explanation;Demonstration;Tactile cues;Verbal cues;Handout    Comprehension  Verbalized understanding;Returned demonstration       PT Short Term Goals - 01/27/19 1029      PT SHORT TERM GOAL #1   Title  Patient to be independent with initial HEP.    Time  4    Period  Weeks    Status  On-going    Target Date  02/17/19        PT Long Term Goals - 01/27/19 1029      PT LONG TERM GOAL #1   Title  Patient to be independent with advanced HEP.    Time  8    Period  Weeks    Status  On-going      PT LONG TERM GOAL #2   Title  Patient to demonstrate mild tightness in B HS, TFL, and hip flexor/quad.     Time  8    Period  Weeks    Status  On-going      PT LONG TERM GOAL #3   Title  Patient to demonstrate 50% improvement in symmetrical weight shift, decreased lateral trunk lean, and improved fluidity of stepping with ambulation.     Time  8    Period  Weeks    Status  On-going      PT LONG TERM GOAL #4   Title  Patient to demonstrate 75% improvement in LE control and stability with ascending/descending 13 steps with 1 handrail.     Time  8    Period  Weeks    Status  On-going      PT LONG TERM GOAL #5   Title  Patient to demonstrate SLS for 30 sec on each LE without LOB.    Time  8    Status  On-going  Plan - 02/03/19 1145    Clinical Impression Statement  Patient arrived to session with no new complaints. Introduced TrA contraction with manual cues for proper palpation and contraction of muscle. Patient with tendency to contract accessory muscles in addition to TrA, will require additional practice to isolate this muscle. Progressed bridges with ball squeeze and banded resistance with patient demonstrating good ROM and tolerance. Reported some persisting L lateral hip discomfort, which was  eased with LE stretching. Attempted deadbug exercise- patient with good form however did report shoulder pain, this this exercise was discontinued. Worked on standing calf strengthening and tandem walking with patient demonstrating good safety awareness. Updated HEP with exercises that were well tolerated today, as well as an education sheet on TrA contraction. Ended session with no complaints.     Clinical Impairments Affecting Rehab Potential  pneumonia, MRSA 2006, HTN, HLD, GERD, DM, dyspnea, COPD, CKD, L periorbital cellulitis, RA    PT Treatment/Interventions  ADLs/Self Care Home Management;Cryotherapy;Electrical Stimulation;Functional mobility training;Stair training;Gait training;Ultrasound;Moist Heat;Therapeutic activities;Therapeutic exercise;Balance training;Neuromuscular re-education;Patient/family education;Orthotic Fit/Training;Passive range of motion;Scar mobilization;Manual techniques;Dry needling;Energy conservation;Splinting;Taping;Vasopneumatic Device    PT Next Visit Plan  progress core strength and balance     Consulted and Agree with Plan of Care  Patient       Patient will benefit from skilled therapeutic intervention in order to improve the following deficits and impairments:  Abnormal gait, Decreased endurance, Decreased activity tolerance, Decreased strength, Pain, Difficulty walking, Decreased balance, Decreased range of motion, Improper body mechanics, Postural dysfunction, Impaired flexibility  Visit Diagnosis: Chronic bilateral low back pain without sciatica  Other abnormalities of gait and mobility  Other symptoms and signs involving the musculoskeletal system     Problem List Patient Active Problem List   Diagnosis Date Noted  . Loss of smell 02/02/2019  . Loss of taste 02/02/2019  . Degenerative spondylolisthesis 09/29/2018  . Chronic cough 09/09/2018  . Low back pain 06/09/2018  . Shoulder pain, bilateral 03/13/2018  . CKD (chronic kidney disease) stage  2, GFR 60-89 ml/min 07/03/2017  . IBS (irritable bowel syndrome) 07/03/2017  . Vitamin D deficiency 08/01/2015  . Mixed simple and mucopurulent chronic bronchitis ([place]) 06/11/2015  . Chorioretinal scar, 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  . Nuclear cataract, bilateral 06/16/2014  . Smoker 09/07/2010  . Hypothyroidism 03/29/2008  . ANEMIA 03/29/2008  . Arthropathy 01/07/2008  . Gout 10/07/2007  . Diabetes mellitus without complication ([place]) 59/16/3846  . Hyperlipemia 07/21/2007  . Essential hypertension 07/21/2007     Janene Harvey, PT, DPT 02/03/19 11:49 AM   [facility] [address]  [place], [place], [address] Phone: [phone]   Fax:  [phone]  Name: ELMINA HENDEL MRN: [id number] Date of Birth: 09/23/1946

## 2019-02-10 ENCOUNTER — Ambulatory Visit: Payer: Medicare Other

## 2019-02-10 DIAGNOSIS — M5441 Lumbago with sciatica, right side: Secondary | ICD-10-CM | POA: Diagnosis not present

## 2019-02-10 DIAGNOSIS — R2689 Other abnormalities of gait and mobility: Secondary | ICD-10-CM | POA: Diagnosis not present

## 2019-02-10 DIAGNOSIS — M6281 Muscle weakness (generalized): Secondary | ICD-10-CM

## 2019-02-10 DIAGNOSIS — R29898 Other symptoms and signs involving the musculoskeletal system: Secondary | ICD-10-CM

## 2019-02-10 DIAGNOSIS — M545 Low back pain: Principal | ICD-10-CM

## 2019-02-10 DIAGNOSIS — R262 Difficulty in walking, not elsewhere classified: Secondary | ICD-10-CM

## 2019-02-10 DIAGNOSIS — M5442 Lumbago with sciatica, left side: Secondary | ICD-10-CM

## 2019-02-10 DIAGNOSIS — G8929 Other chronic pain: Secondary | ICD-10-CM | POA: Diagnosis not present

## 2019-02-10 NOTE — Therapy (Signed)
[facility] [address]  [place], [place], [address] Phone: [phone]   Fax:  [phone]  Physical Therapy Treatment  Patient Details  Name: Deborah Bryant MRN: [id number] Date of Birth: 1946-04-04 Referring Provider (PT): Earnie Larsson, MD   Encounter Date: 02/10/2019  PT End of Session - 02/10/19 1059    Visit Number  4    Number of Visits  9    Date for PT Re-Evaluation  03/17/19    Authorization Type  Medicare & BCBS    PT Start Time  1053    PT Stop Time  1131    PT Time Calculation (min)  38 min    Activity Tolerance  Patient tolerated treatment well    Behavior During Therapy  [facility] for tasks assessed/performed       Past Medical History:  Diagnosis Date  . Allergic rhinitis   . Anxiety   . Arthritis    sees Dr. Gavin Pound   . Cellulitis, periorbital 2006   left eye, due to MRSA, saw Dr. Erik Obey   . Chronic kidney disease   . Complication of anesthesia   . COPD (chronic obstructive pulmonary disease) ([place])   . Cough   . Diabetes mellitus   . Dyspnea    with exertion  . GERD (gastroesophageal reflux disease)    OTC  Pepcid  . Hemorrhoids   . Hyperlipidemia   . Hypertension   . IBS (irritable bowel syndrome)   . Menopause   . 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 Pneumonia   . PONV (postoperative nausea and vomiting)   . Tubular adenoma of colon 12/2006    Past Surgical History:  Procedure Laterality Date  . APPENDECTOMY    . BACK SURGERY    . COLONOSCOPY  04-01-12   per Dr. Fuller Plan, tubular adenoma, repeat in 5 yrs   . POLYPECTOMY    . TUBAL LIGATION      There were no vitals filed for this visit.  Subjective Assessment - 02/10/19 1057    Subjective  Pt. doing well today and denies soreness after last session.  Reports HEP updated exercises are going well.      Pertinent History  pneumonia, MRSA 2006, HTN, HLD, GERD, DM, dyspnea, COPD, CKD, L  periorbital cellulitis, RA    Diagnostic tests  11/02/18 lumbar xray: L4-5 fusion hardware appears to be in good position without complicating features. Stable advanced disc disease and facet disease in the lumbar spine.    Patient Stated Goals  "strengthen these legs, more endurance"    Currently in Pain?  No/denies    Pain Score  0-No pain    Multiple Pain Sites  No                       [facility] Adult PT Treatment/Exercise - 02/10/19 1058      Self-Care   Self-Care  Other Self-Care Comments      Lumbar Exercises: Supine   Clam  15 reps;3 seconds    Clam Limitations  + abdom bracing with red looped TB at knees     Dead Bug  3 seconds   x 12 reps    Dead Bug Limitations  LE only as to avoid shoulder pain     Bridge with Ball Squeeze  10 reps;3 seconds      Lumbar Exercises: Sidelying   Clam  3  seconds   x 12 reps     Knee/Hip Exercises: Aerobic   Nustep  Lvl 3, 6 min (LE/UE) - well tolerated        Knee/Hip Exercises: Standing   Heel Raises  Both;1 set;20 reps    Heel Raises Limitations  at counter top    Functional Squat  3 seconds   x 12 reps      Knee/Hip Exercises: Sidelying   Clams  B sidelying clam shell red TB at hips x 12 reps                PT Short Term Goals - 01/27/19 1029      PT SHORT TERM GOAL #1   Title  Patient to be independent with initial HEP.    Time  4    Period  Weeks    Status  On-going    Target Date  02/17/19        PT Long Term Goals - 01/27/19 1029      PT LONG TERM GOAL #1   Title  Patient to be independent with advanced HEP.    Time  8    Period  Weeks    Status  On-going      PT LONG TERM GOAL #2   Title  Patient to demonstrate mild tightness in B HS, TFL, and hip flexor/quad.     Time  8    Period  Weeks    Status  On-going      PT LONG TERM GOAL #3   Title  Patient to demonstrate 50% improvement in symmetrical weight shift, decreased lateral trunk lean, and improved fluidity of stepping with  ambulation.     Time  8    Period  Weeks    Status  On-going      PT LONG TERM GOAL #4   Title  Patient to demonstrate 75% improvement in LE control and stability with ascending/descending 13 steps with 1 handrail.     Time  8    Period  Weeks    Status  On-going      PT LONG TERM GOAL #5   Title  Patient to demonstrate SLS for 30 sec on each LE without LOB.    Time  8    Status  On-going            Plan - 02/10/19 1100    Clinical Impression Statement  [name] doing well today and notes no issues with updated HEP.  Reports improvement in back pain over this past week.  Tolerated all lumbopelvic strengthening activities and standing balance activities in tandem and SLS well today without back pain.  Most difficulty with backwards tandem walk requiring intermittent UE support at treadmill rail and supervision from therapist for safety.  Able to demo 10 sec B SLS today without UE support demonstrating progress toward LTG #5.      Rehab Potential  Good    Clinical Impairments Affecting Rehab Potential  pneumonia, MRSA 2006, HTN, HLD, GERD, DM, dyspnea, COPD, CKD, L periorbital cellulitis, RA    PT Treatment/Interventions  ADLs/Self Care Home Management;Cryotherapy;Electrical Stimulation;Functional mobility training;Stair training;Gait training;Ultrasound;Moist Heat;Therapeutic activities;Therapeutic exercise;Balance training;Neuromuscular re-education;Patient/family education;Orthotic Fit/Training;Passive range of motion;Scar mobilization;Manual techniques;Dry needling;Energy conservation;Splinting;Taping;Vasopneumatic Device    PT Next Visit Plan  progress core strength and balance     Consulted and Agree with Plan of Care  Patient       Patient will benefit from skilled therapeutic intervention in order to  improve the following deficits and impairments:  Abnormal gait, Decreased endurance, Decreased activity tolerance, Decreased strength, Pain, Difficulty walking, Decreased balance,  Decreased range of motion, Improper body mechanics, Postural dysfunction, Impaired flexibility  Visit Diagnosis: Chronic bilateral low back pain without sciatica  Other abnormalities of gait and mobility  Other symptoms and signs involving the musculoskeletal system  Acute bilateral low back pain with bilateral sciatica  Muscle weakness (generalized)  Difficulty in walking, not elsewhere classified     Problem List Patient Active Problem List   Diagnosis Date Noted  . Loss of smell 02/02/2019  . Loss of taste 02/02/2019  . Degenerative spondylolisthesis 09/29/2018  . Chronic cough 09/09/2018  . Low back pain 06/09/2018  . Shoulder pain, bilateral 03/13/2018  . CKD (chronic kidney disease) stage 2, GFR 60-89 ml/min 07/03/2017  . IBS (irritable bowel syndrome) 07/03/2017  . Vitamin D deficiency 08/01/2015  . Mixed simple and mucopurulent chronic bronchitis ([place]) 06/11/2015  . Chorioretinal scar, 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  . Nuclear cataract, bilateral 06/16/2014  . Smoker 09/07/2010  . Hypothyroidism 03/29/2008  . ANEMIA 03/29/2008  . Arthropathy 01/07/2008  . Gout 10/07/2007  . Diabetes mellitus without complication ([place]) 62/22/9798  . Hyperlipemia 07/21/2007  . Essential hypertension 07/21/2007    Bess Harvest, PTA 02/10/19 11:53 AM   [facility] [address]  [place], [place], [address] Phone: [phone]   Fax:  [phone]  Name: Deborah Bryant MRN: [id number] Date of Birth: 1946/02/11

## 2019-02-17 ENCOUNTER — Ambulatory Visit: Payer: Medicare Other | Attending: Neurosurgery

## 2019-02-17 DIAGNOSIS — G8929 Other chronic pain: Secondary | ICD-10-CM | POA: Insufficient documentation

## 2019-02-17 DIAGNOSIS — R29898 Other symptoms and signs involving the musculoskeletal system: Secondary | ICD-10-CM | POA: Diagnosis not present

## 2019-02-17 DIAGNOSIS — R2689 Other abnormalities of gait and mobility: Secondary | ICD-10-CM | POA: Diagnosis not present

## 2019-02-17 DIAGNOSIS — M545 Low back pain, unspecified: Secondary | ICD-10-CM

## 2019-02-17 NOTE — Therapy (Signed)
[facility] [address]  [place], [place], [address] Phone: [phone]   Fax:  [phone]  Physical Therapy Treatment  Patient Details  Name: Deborah Bryant MRN: [id number] Date of Birth: 10-10-1946 Referring Provider (PT): Earnie Larsson, MD   Encounter Date: 02/17/2019  PT End of Session - 02/17/19 1105    Visit Number  5    Number of Visits  9    Date for PT Re-Evaluation  03/17/19    Authorization Type  Medicare & BCBS    PT Start Time  1101    PT Stop Time  1145    PT Time Calculation (min)  44 min    Activity Tolerance  Patient tolerated treatment well    Behavior During Therapy  [facility] for tasks assessed/performed       Past Medical History:  Diagnosis Date  . Allergic rhinitis   . Anxiety   . Arthritis    sees Dr. Gavin Pound   . Cellulitis, periorbital 2006   left eye, due to MRSA, saw Dr. Erik Obey   . Chronic kidney disease   . Complication of anesthesia   . COPD (chronic obstructive pulmonary disease) ([place])   . Cough   . Diabetes mellitus   . Dyspnea    with exertion  . GERD (gastroesophageal reflux disease)    OTC  Pepcid  . Hemorrhoids   . Hyperlipidemia   . Hypertension   . IBS (irritable bowel syndrome)   . Menopause   . 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 Pneumonia   . PONV (postoperative nausea and vomiting)   . Tubular adenoma of colon 12/2006    Past Surgical History:  Procedure Laterality Date  . APPENDECTOMY    . BACK SURGERY    . COLONOSCOPY  04-01-12   per Dr. Fuller Plan, tubular adenoma, repeat in 5 yrs   . POLYPECTOMY    . TUBAL LIGATION      There were no vitals filed for this visit.  Subjective Assessment - 02/17/19 1105    Subjective  Pt. doin well today with no new complaints.  reports she has been practicing SLS balance beside couch for support at home.      Pertinent History  pneumonia, MRSA 2006, HTN, HLD, GERD, DM, dyspnea,  COPD, CKD, L periorbital cellulitis, RA    Diagnostic tests  11/02/18 lumbar xray: L4-5 fusion hardware appears to be in good position without complicating features. Stable advanced disc disease and facet disease in the lumbar spine.    Patient Stated Goals  "strengthen these legs, more endurance"    Currently in Pain?  No/denies    Pain Score  0-No pain    Multiple Pain Sites  No                       [facility] Adult PT Treatment/Exercise - 02/17/19 1117      Lumbar Exercises: Stretches   Passive Hamstring Stretch  Right;Left;1 rep;30 seconds    Passive Hamstring Stretch Limitations  strap supine     Single Knee to Chest Stretch  Right;Left;1 rep;30 seconds    Single Knee to Chest Stretch Limitations  B    Piriformis Stretch  Right;Left;1 rep;30 seconds    Piriformis Stretch Limitations  KTOS      Lumbar Exercises: Aerobic   Tread Mill  --      Lumbar Exercises: Machines for Strengthening  Other Lumbar Machine Exercise  B BATCA low row 20# x 15 reps; B single arm BATCA low row 5# x 10 reps each UE      Lumbar Exercises: Supine   Dead Bug  15 reps;3 seconds    Bridge with [employer]  --    Bridge with [employer] Limitations  --      Lumbar Exercises: Sidelying   Clam  --    Clam Limitations  --      Knee/Hip Exercises: Aerobic   Tread Mill  Lvl 2.0, 6 min       Knee/Hip Exercises: Standing   SLS with Vectors  B SLS with opposite LE toe-touch to cones 5 x 3 cones x 2 sets each LE; intermittent UE support at counter       Knee/Hip Exercises: Supine   Bridges with [employer]  15 reps;Both;Strengthening    Bridges with Clamshell  Both;15 reps   with isometric hip abd/ER into red TB at knees      Knee/Hip Exercises: Sidelying   Clams  B sidelying clam shell red TB at hips x 15 reps              PT Education - 02/17/19 1155    Education Details  HEP update; SLS balance     Person(s) Educated  Patient    Methods  Explanation;Demonstration;Verbal  cues;Handout    Comprehension  Verbalized understanding;Returned demonstration;Need further instruction       PT Short Term Goals - 02/17/19 1106      PT SHORT TERM GOAL #1   Title  Patient to be independent with initial HEP.    Time  4    Period  Weeks    Status  Achieved    Target Date  02/17/19        PT Long Term Goals - 01/27/19 1029      PT LONG TERM GOAL #1   Title  Patient to be independent with advanced HEP.    Time  8    Period  Weeks    Status  On-going      PT LONG TERM GOAL #2   Title  Patient to demonstrate mild tightness in B HS, TFL, and hip flexor/quad.     Time  8    Period  Weeks    Status  On-going      PT LONG TERM GOAL #3   Title  Patient to demonstrate 50% improvement in symmetrical weight shift, decreased lateral trunk lean, and improved fluidity of stepping with ambulation.     Time  8    Period  Weeks    Status  On-going      PT LONG TERM GOAL #4   Title  Patient to demonstrate 75% improvement in LE control and stability with ascending/descending 13 steps with 1 handrail.     Time  8    Period  Weeks    Status  On-going      PT LONG TERM GOAL #5   Title  Patient to demonstrate SLS for 30 sec on each LE without LOB.    Time  8    Status  On-going            Plan - 02/17/19 1116    Clinical Impression Statement  Pt. reporting she has seen ~ 65% improvement in LBP since starting therapy.  Tolerated progression of lumbopelvic strengthening, SLS balance training, and scapular strengthening activities well today.  Most difficulty  with stability on 8" forward step-up today requiring intermittent UE support and therapist supervision for safety.  HEP updated with SLS balance activity with pt. verbalizing understanding.  Progressing well toward goals.      Rehab Potential  Good    Clinical Impairments Affecting Rehab Potential  pneumonia, MRSA 2006, HTN, HLD, GERD, DM, dyspnea, COPD, CKD, L periorbital cellulitis, RA    PT  Treatment/Interventions  ADLs/Self Care Home Management;Cryotherapy;Electrical Stimulation;Functional mobility training;Stair training;Gait training;Ultrasound;Moist Heat;Therapeutic activities;Therapeutic exercise;Balance training;Neuromuscular re-education;Patient/family education;Orthotic Fit/Training;Passive range of motion;Scar mobilization;Manual techniques;Dry needling;Energy conservation;Splinting;Taping;Vasopneumatic Device    PT Next Visit Plan  progress core strength and balance     Consulted and Agree with Plan of Care  Patient       Patient will benefit from skilled therapeutic intervention in order to improve the following deficits and impairments:  Abnormal gait, Decreased endurance, Decreased activity tolerance, Decreased strength, Pain, Difficulty walking, Decreased balance, Decreased range of motion, Improper body mechanics, Postural dysfunction, Impaired flexibility  Visit Diagnosis: Chronic bilateral low back pain without sciatica  Other abnormalities of gait and mobility  Other symptoms and signs involving the musculoskeletal system     Problem List Patient Active Problem List   Diagnosis Date Noted  . Loss of smell 02/02/2019  . Loss of taste 02/02/2019  . Degenerative spondylolisthesis 09/29/2018  . Chronic cough 09/09/2018  . Low back pain 06/09/2018  . Shoulder pain, bilateral 03/13/2018  . CKD (chronic kidney disease) stage 2, GFR 60-89 ml/min 07/03/2017  . IBS (irritable bowel syndrome) 07/03/2017  . Vitamin D deficiency 08/01/2015  . Mixed simple and mucopurulent chronic bronchitis ([place]) 06/11/2015  . Chorioretinal scar, 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  . Nuclear cataract, bilateral 06/16/2014  . Smoker 09/07/2010  . Hypothyroidism 03/29/2008  . ANEMIA 03/29/2008  . Arthropathy 01/07/2008  . Gout 10/07/2007  . Diabetes mellitus without complication ([place]) 00/37/0488  . Hyperlipemia 07/21/2007  . Essential hypertension 07/21/2007    Bess Harvest, PTA 02/17/19  12:04 PM   [facility] [address]  [place], [place], [address] Phone: [phone]   Fax:  [phone]  Name: Deborah Bryant MRN: [id number] Date of Birth: October 25, 1946

## 2019-02-19 NOTE — Progress Notes (Signed)
NEUROLOGY CONSULTATION NOTE  KIWANNA SPRAKER MRN: [id number] DOB: 1946-04-06  Referring provider: Alysia Penna, MD Primary care provider: Alysia Penna, MD  Reason for consult:  Lack of smell and taste  HISTORY OF PRESENT ILLNESS: Deborah Bryant is a 73 year old right-handed Caucasian woman who presents for evaluation of loss of smell and taste.  History supplemented by surgical, referring provider, and ED notes.  The patient underwent L4-5 decompression and fusion on 09/29/2018.  There were no complications.  About 2 weeks after surgery, she noticed sudden onset of symptoms, including loss of smell and taste as well as a little lightheaded, fatigued, diffuse weakness, unsteadiness and short-term memory problems.  She presented to the ED on 11/02/2018 for further evaluation.  MRI of brain with and without contrast performed was personally reviewed and demonstrated some incidental chronic small vessel ischemic changes but no acute abnormalities including normal appearance of olfactory bulbs and cribriform plate to explain symptoms.  Labs, including CBC, CMP, ammonia, CK and TSH, were unremarkable.  Delayed effects of anesthesia was suspected.  Fogginess has improved.  She still has some trouble multitasking.  She still has loss of taste and smell.  She can still barely taste sweets but otherwise complete loss of taste and smell.  She has allergies but no recent colds or other upper respiratory viral illness.  Sleep is variable but no history consistent with REM sleep behavior disorder.    PAST MEDICAL HISTORY: Past Medical History:  Diagnosis Date  . Allergic rhinitis   . Anxiety   . Arthritis    sees Dr. Gavin Pound   . Cellulitis, periorbital 2006   left eye, due to MRSA, saw Dr. Erik Obey   . Chronic kidney disease   . Complication of anesthesia   . COPD (chronic obstructive pulmonary disease) ([place])   . Cough   . Diabetes mellitus   . Dyspnea    with exertion  . GERD (gastroesophageal  reflux disease)    OTC  Pepcid  . Hemorrhoids   . Hyperlipidemia   . Hypertension   . IBS (irritable bowel syndrome)   . Menopause   . 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 Pneumonia   . PONV (postoperative nausea and vomiting)   . Tubular adenoma of colon 12/2006    PAST SURGICAL HISTORY: Past Surgical History:  Procedure Laterality Date  . APPENDECTOMY    . BACK SURGERY    . COLONOSCOPY  04-01-12   per Dr. Fuller Plan, tubular adenoma, repeat in 5 yrs   . POLYPECTOMY    . TUBAL LIGATION      MEDICATIONS: Current Outpatient Medications on File Prior to Visit  Medication Sig Dispense Refill  . allopurinol (ZYLOPRIM) 300 MG tablet Take 1 tablet (300 mg total) by mouth daily. 90 tablet 3  . Ascorbic Acid (VITAMIN C) 1000 MG tablet Take 1,000 mg by mouth daily.    Marland Kitchen aspirin EC 81 MG tablet Take 81 mg by mouth daily.    Marland Kitchen atorvastatin (LIPITOR) 40 MG tablet Take 1 tablet (40 mg total) by mouth daily. 90 tablet 3  . Cholecalciferol (VITAMIN D) 2000 units CAPS Take 2,000 Units by mouth daily.     . diazepam (VALIUM) 5 MG tablet Take 1-2 tablets (5-10 mg total) by mouth every 6 (six) hours as needed for muscle spasms. 30 tablet 0  . diclofenac (VOLTAREN) 75 MG EC tablet Take 1 tablet (75 mg total) by mouth 2 (two) times daily as  needed for moderate pain. (Patient taking differently: Take 75 mg by mouth daily. ) 180 tablet 3  . diclofenac sodium (VOLTAREN) 1 % GEL Apply 1 application topically at bedtime.    Marland Kitchen diltiazem (DILTIAZEM CD) 240 MG 24 hr capsule Take 1 capsule (240 mg total) by mouth daily. 90 capsule 3  . famotidine (PEPCID) 10 MG tablet Take 10 mg by mouth 2 (two) times daily.    . fluticasone (FLONASE) 50 MCG/ACT nasal spray Place 2 sprays into both nostrils 2 (two) times daily as needed. For allergies (Patient not taking: Reported on 09/16/2018) 48 g 3  . glipiZIDE (GLUCOTROL XL) 5 MG 24 hr tablet TAKE ONE (1) TABLET BY MOUTH EACH DAY (Patient  taking differently: Take 5 mg by mouth daily with breakfast. ) 90 tablet 3  . hydrochlorothiazide (HYDRODIURIL) 25 MG tablet Take 1 tablet (25 mg total) by mouth daily. 90 tablet 3  . HYDROcodone-acetaminophen (NORCO) 10-325 MG tablet Take 1 tablet by mouth every 6 (six) hours as needed for moderate pain. 120 tablet 0  . hydroxychloroquine (PLAQUENIL) 200 MG tablet Take 200 mg by mouth daily.     . Multiple Vitamins-Calcium (ONE-A-DAY WOMENS PO) Take 1 tablet by mouth daily.    . polyethylene glycol powder (GLYCOLAX/MIRALAX) powder Take 8.5-17 g by mouth at bedtime.     . promethazine-codeine (PHENERGAN WITH CODEINE) 6.25-10 MG/5ML syrup Take 5 mLs by mouth every 6 (six) hours as needed for up to 30 days. 240 mL 0  . saccharomyces boulardii (FLORASTOR) 250 MG capsule Take 250 mg by mouth daily.     No current facility-administered medications on file prior to visit.     ALLERGIES: Allergies  Allergen Reactions  . Losartan Shortness Of Breath  . Benazepril Cough  . Lasix [Furosemide] Other (See Comments)    Rash, changes in eyesight  . Levaquin [Levofloxacin] Other (See Comments)    Tendon and muscle pain  . Phenobarbital Nausea And Vomiting  . Tape Rash    Can use paper tape    FAMILY HISTORY: Family History  Problem Relation Age of Onset  . Alcohol abuse Mother   . Diabetes Mother   . Heart disease Father   . Heart disease Brother    SOCIAL HISTORY: Social History   Socioeconomic History  . Marital status: Married    Spouse name: Not on file  . Number of children: Not on file  . Years of education: Not on file  . Highest education level: Not on file  Occupational History  . Not on file  Social Needs  . Financial resource strain: Not on file  . Food insecurity:    Worry: Not on file    Inability: Not on file  . Transportation needs:    Medical: Not on file    Non-medical: Not on file  Tobacco Use  . Smoking status: Current Every Day Smoker    Packs/day: 1.00     Years: 1.00    Pack years: 1.00    Types: Cigarettes  . Smokeless tobacco: Never Used  Substance and Sexual Activity  . Alcohol use: Yes    Alcohol/week: 0.0 standard drinks    Comment: glass of wine daily  . Drug use: No  . Sexual activity: Yes    Birth control/protection: Post-menopausal  Lifestyle  . Physical activity:    Days per week: Not on file    Minutes per session: Not on file  . Stress: Not on file  Relationships  .  Social connections:    Talks on phone: Not on file    Gets together: Not on file    Attends religious service: Not on file    Active member of club or organization: Not on file    Attends meetings of clubs or organizations: Not on file    Relationship status: Not on file  . Intimate partner violence:    Fear of current or ex partner: Not on file    Emotionally abused: Not on file    Physically abused: Not on file    Forced sexual activity: Not on file  Other Topics Concern  . Not on file  Social History Narrative  . Not on file    REVIEW OF SYSTEMS: Constitutional: No fevers, chills, or sweats, no generalized fatigue, change in appetite Eyes: No visual changes, double vision, eye pain Ear, nose and throat: No hearing loss, ear pain, nasal congestion, sore throat Cardiovascular: No chest pain, palpitations Respiratory:  No shortness of breath at rest or with exertion, wheezes GastrointestinaI: No nausea, vomiting, diarrhea, abdominal pain, fecal incontinence Genitourinary:  No dysuria, urinary retention or frequency Musculoskeletal:  No neck pain, back pain Integumentary: No rash, pruritus, skin lesions Neurological: as above Psychiatric: No depression, insomnia, anxiety Endocrine: No palpitations, fatigue, diaphoresis, mood swings, change in appetite, change in weight, increased thirst Hematologic/Lymphatic:  No purpura, petechiae. Allergic/Immunologic: no itchy/runny eyes, nasal congestion, recent allergic reactions, rashes  PHYSICAL  EXAM: Blood pressure 136/66, pulse 64, height 5\' 2"  (1.575 m), weight 169 lb (76.7 kg), SpO2 93 %. General: No acute distress.  Patient appears well-groomed.   Head:  Normocephalic/atraumatic Eyes:  fundi examined but not visualized Neck: supple, no paraspinal tenderness, full range of motion Back: No paraspinal tenderness Heart: regular rate and rhythm Lungs: Clear to auscultation bilaterally. Vascular: No carotid bruits. Neurological Exam: Mental status: alert and oriented to person, place, and time, recent and remote memory intact, fund of knowledge intact, attention and concentration intact, speech fluent and not dysarthric, language intact. Cranial nerves: CN I: not tested CN II: pupils equal, round and reactive to light, visual fields intact CN III, IV, VI:  full range of motion, no nystagmus, no ptosis CN V: facial sensation intact CN VII: upper and lower face symmetric CN VIII: hearing intact CN IX, X: gag intact, uvula midline CN XI: sternocleidomastoid and trapezius muscles intact CN XII: tongue midline Bulk & Tone: normal, no fasciculations. Motor:  5/5 throughout  Sensation:  Pinprick sensation intact and vibration sensation reduced in toes.   Deep Tendon Reflexes:  absent throughout, toes downgoing.   Finger to nose testing:  Without dysmetria.   Heel to shin:  Without dysmetria.   Gait:  Gait mildly wide-based. Romberg positive  IMPRESSION: 1.  Aguesia and anosmia. Unclear etiology.  I am not sure if it could be residual effects of anesthesia.  No intracranial abnormality on MRI.  At this time, she does not exhibit any symptoms of a neurodegenerative disease.  2.  Peripheral neuropathy in feet, likely related to diabetes but also consider B12 deficiency (which may also contribute to lack of smell) 3.  Tobacco use disorder  PLAN: 1.  Check B12 2.  Tobacco cessation counseling (CPT 99406):  Tobacco use with no history of CAD, stroke, or cancer  - Currently smoking  1 packs/day   - Patient was informed of the dangers of tobacco abuse including stroke, cancer, and MI, as well as benefits of tobacco cessation. - Patient is not willing to  quit at this time. - Approximately 4 mins were spent counseling patient cessation techniques. We discussed various methods to help quit smoking, including deciding on a date to quit, joining a support group, pharmacological agents- nicotine gum/patch/lozenges, chantix.  - I will reassess her progress at the next follow-up visit   Thank you for allowing me to take part in the care of this patient.  Metta Clines, DO  CC: Alysia Penna, MD

## 2019-02-23 ENCOUNTER — Ambulatory Visit (INDEPENDENT_AMBULATORY_CARE_PROVIDER_SITE_OTHER): Payer: Medicare Other | Admitting: Neurology

## 2019-02-23 ENCOUNTER — Other Ambulatory Visit (INDEPENDENT_AMBULATORY_CARE_PROVIDER_SITE_OTHER): Payer: Medicare Other

## 2019-02-23 ENCOUNTER — Encounter: Payer: Self-pay | Admitting: Neurology

## 2019-02-23 VITALS — BP 136/66 | HR 64 | Ht 62.0 in | Wt 169.0 lb

## 2019-02-23 DIAGNOSIS — R6889 Other general symptoms and signs: Secondary | ICD-10-CM

## 2019-02-23 DIAGNOSIS — F17210 Nicotine dependence, cigarettes, uncomplicated: Secondary | ICD-10-CM | POA: Diagnosis not present

## 2019-02-23 DIAGNOSIS — R432 Parageusia: Secondary | ICD-10-CM

## 2019-02-23 DIAGNOSIS — F17200 Nicotine dependence, unspecified, uncomplicated: Secondary | ICD-10-CM | POA: Diagnosis not present

## 2019-02-23 DIAGNOSIS — R430 Anosmia: Secondary | ICD-10-CM

## 2019-02-23 LAB — VITAMIN B12: VITAMIN B 12: 337 pg/mL (ref 211–911)

## 2019-02-23 NOTE — Patient Instructions (Addendum)
I have no explanation for your loss of smell and taste.  Other than some neuropathy in the feet, neurologic exam unremarkable.  We will check a B12 lab today  Your provider has requested that you have labwork completed today. Please go to [facility] ([id number]) on the second floor of this building before leaving the office today. You do not need to check in. If you are not called within 15 minutes please check with the front desk.

## 2019-02-24 ENCOUNTER — Encounter: Payer: Self-pay | Admitting: Physical Therapy

## 2019-02-24 ENCOUNTER — Other Ambulatory Visit: Payer: Self-pay

## 2019-02-24 ENCOUNTER — Ambulatory Visit: Payer: Medicare Other | Admitting: Physical Therapy

## 2019-02-24 DIAGNOSIS — M545 Low back pain: Secondary | ICD-10-CM | POA: Diagnosis not present

## 2019-02-24 DIAGNOSIS — R2689 Other abnormalities of gait and mobility: Secondary | ICD-10-CM | POA: Diagnosis not present

## 2019-02-24 DIAGNOSIS — G8929 Other chronic pain: Secondary | ICD-10-CM | POA: Diagnosis not present

## 2019-02-24 DIAGNOSIS — R29898 Other symptoms and signs involving the musculoskeletal system: Secondary | ICD-10-CM

## 2019-02-24 NOTE — Therapy (Signed)
[facility] [address]  [place], [place], [address] Phone: [phone]   Fax:  [phone]  Physical Therapy Treatment  Patient Details  Name: Deborah Bryant MRN: [id number] Date of Birth: Jul 25, 1946 Referring Provider (PT): Earnie Larsson, MD   Encounter Date: 02/24/2019  PT End of Session - 02/24/19 1142    Visit Number  6    Number of Visits  9    Date for PT Re-Evaluation  03/17/19    Authorization Type  Medicare & BCBS    PT Start Time  1052    PT Stop Time  1140    PT Time Calculation (min)  48 min    Equipment Utilized During Treatment  Gait belt    Activity Tolerance  Patient tolerated treatment well    Behavior During Therapy  [facility] for tasks assessed/performed       Past Medical History:  Diagnosis Date  . Allergic rhinitis   . Anxiety   . Arthritis    sees Dr. Gavin Pound   . Cellulitis, periorbital 2006   left eye, due to MRSA, saw Dr. Erik Obey   . Chronic kidney disease   . Complication of anesthesia   . COPD (chronic obstructive pulmonary disease) ([place])   . Cough   . Diabetes mellitus   . Dyspnea    with exertion  . GERD (gastroesophageal reflux disease)    OTC  Pepcid  . Hemorrhoids   . Hyperlipidemia   . Hypertension   . IBS (irritable bowel syndrome)   . Menopause   . 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 Pneumonia   . PONV (postoperative nausea and vomiting)   . Tubular adenoma of colon 12/2006    Past Surgical History:  Procedure Laterality Date  . APPENDECTOMY    . BACK SURGERY    . COLONOSCOPY  04-01-12   per Dr. Fuller Plan, tubular adenoma, repeat in 5 yrs   . POLYPECTOMY    . TUBAL LIGATION      There were no vitals filed for this visit.  Subjective Assessment - 02/24/19 1051    Subjective  Patient reports that her stomach is acting up today d/t IBS.     Pertinent History  pneumonia, MRSA 2006, HTN, HLD, GERD, DM, dyspnea, COPD, CKD, L  periorbital cellulitis, RA    Diagnostic tests  11/02/18 lumbar xray: L4-5 fusion hardware appears to be in good position without complicating features. Stable advanced disc disease and facet disease in the lumbar spine.    Patient Stated Goals  "strengthen these legs, more endurance"    Currently in Pain?  No/denies                       [facility] Adult PT Treatment/Exercise - 02/24/19 0001      Neuro Re-ed    Neuro Re-ed Details   cross body reach on foam reaching to cone x10 each side; R & L anterior/posterior stepping with head turns up/down to targets x10 each side with CGA; romberg balance on foam 1x30" 1x30" with manual perturbations; bouncing/throwing ball + romberg on foam x3 min;   increased unsteadiness on L LE     Lumbar Exercises: Stretches   Other Lumbar Stretch Exercise  R & L prone quad stretch 30" each      Knee/Hip Exercises: Aerobic   Nustep  Lvl 3, 6 min (LE/UE)  Knee/Hip Exercises: Standing   Forward Step Up  Right;Left;1 set;10 reps;Hand Hold: 0;Step Height: 8"    Forward Step Up Limitations  w/ CGA; cues to increase R DF and hip flexion to avoid catching toe on L anterior step up      Knee/Hip Exercises: Seated   Sit to Sand  2 sets;10 reps;without UE support   1st set on foam, 2nd set EC on foam            PT Education - 02/24/19 1142    Education Details  update to HEP    Person(s) Educated  Patient    Methods  Explanation;Demonstration;Tactile cues;Verbal cues;Handout    Comprehension  Verbalized understanding;Returned demonstration       PT Short Term Goals - 02/17/19 1106      PT SHORT TERM GOAL #1   Title  Patient to be independent with initial HEP.    Time  4    Period  Weeks    Status  Achieved    Target Date  02/17/19        PT Long Term Goals - 01/27/19 1029      PT LONG TERM GOAL #1   Title  Patient to be independent with advanced HEP.    Time  8    Period  Weeks    Status  On-going      PT LONG TERM GOAL  #2   Title  Patient to demonstrate mild tightness in B HS, TFL, and hip flexor/quad.     Time  8    Period  Weeks    Status  On-going      PT LONG TERM GOAL #3   Title  Patient to demonstrate 50% improvement in symmetrical weight shift, decreased lateral trunk lean, and improved fluidity of stepping with ambulation.     Time  8    Period  Weeks    Status  On-going      PT LONG TERM GOAL #4   Title  Patient to demonstrate 75% improvement in LE control and stability with ascending/descending 13 steps with 1 handrail.     Time  8    Period  Weeks    Status  On-going      PT LONG TERM GOAL #5   Title  Patient to demonstrate SLS for 30 sec on each LE without LOB.    Time  8    Status  On-going            Plan - 02/24/19 1143    Clinical Impression Statement  Patient reports that she saw her neurologist yesterday who advised her that she had some neuropathy in her feet. Had trouble with narrow BOS and eyes closed testing. Worked on STS with EO/EC on foam with patient demonstrating good ability to correct balance. Also worked on Environmental consultant exercises on foam with patient demonstrating no LOB, however observable reliance on ankle strategy. Patient with increased unsteadiness on L LE with stepping activities today. Updated HEP with exercises that were well-tolerated today and advised patient to perform at counter top for safety. Patient reported understanding. No complaints at end of session. Patient progressing well towards goals.    PT Treatment/Interventions  ADLs/Self Care Home Management;Cryotherapy;Electrical Stimulation;Functional mobility training;Stair training;Gait training;Ultrasound;Moist Heat;Therapeutic activities;Therapeutic exercise;Balance training;Neuromuscular re-education;Patient/family education;Orthotic Fit/Training;Passive range of motion;Scar mobilization;Manual techniques;Dry needling;Energy conservation;Splinting;Taping;Vasopneumatic Device    PT Next Visit Plan   challenge balance with narrow BOS, head turns, EC    Consulted and Agree  with Plan of Care  Patient       Patient will benefit from skilled therapeutic intervention in order to improve the following deficits and impairments:  Abnormal gait, Decreased endurance, Decreased activity tolerance, Decreased strength, Pain, Difficulty walking, Decreased balance, Decreased range of motion, Improper body mechanics, Postural dysfunction, Impaired flexibility  Visit Diagnosis: Chronic bilateral low back pain without sciatica  Other abnormalities of gait and mobility  Other symptoms and signs involving the musculoskeletal system     Problem List Patient Active Problem List   Diagnosis Date Noted  . Loss of smell 02/02/2019  . Loss of taste 02/02/2019  . Degenerative spondylolisthesis 09/29/2018  . Chronic cough 09/09/2018  . Low back pain 06/09/2018  . Shoulder pain, bilateral 03/13/2018  . CKD (chronic kidney disease) stage 2, GFR 60-89 ml/min 07/03/2017  . IBS (irritable bowel syndrome) 07/03/2017  . Vitamin D deficiency 08/01/2015  . Mixed simple and mucopurulent chronic bronchitis ([place]) 06/11/2015  . Chorioretinal scar, 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  . Nuclear cataract, bilateral 06/16/2014  . Smoker 09/07/2010  . Hypothyroidism 03/29/2008  . ANEMIA 03/29/2008  . Arthropathy 01/07/2008  . Gout 10/07/2007  . Diabetes mellitus without complication ([place]) 65/79/0383  . Hyperlipemia 07/21/2007  . Essential hypertension 07/21/2007    Janene Harvey, PT, DPT 02/24/19 11:47 AM    [facility] [address]  [place], [place], [address] Phone: [phone]   Fax:  [phone]  Name: JAMYA STARRY MRN: [id number] Date of Birth: November 04, 1946

## 2019-03-03 ENCOUNTER — Other Ambulatory Visit: Payer: Self-pay

## 2019-03-03 ENCOUNTER — Ambulatory Visit: Payer: Medicare Other

## 2019-03-03 DIAGNOSIS — G8929 Other chronic pain: Secondary | ICD-10-CM

## 2019-03-03 DIAGNOSIS — R2689 Other abnormalities of gait and mobility: Secondary | ICD-10-CM

## 2019-03-03 DIAGNOSIS — R29898 Other symptoms and signs involving the musculoskeletal system: Secondary | ICD-10-CM

## 2019-03-03 DIAGNOSIS — M545 Low back pain: Principal | ICD-10-CM

## 2019-03-03 NOTE — Therapy (Addendum)
[facility] [address]  [place], [place], [address] Phone: [phone]   Fax:  [phone]  Physical Therapy Treatment  Patient Details  Name: Deborah Bryant MRN: [id number] Date of Birth: March 09, 1946 Referring Provider (PT): Deborah Larsson, MD   Progress Note Reporting Period 01/20/19 to 03/03/19  See note below for Objective Data and Assessment of Progress/Goals.     Encounter Date: 03/03/2019  PT End of Session - 03/03/19 1105    Visit Number  7    Number of Visits  9    Date for PT Re-Evaluation  03/17/19    Authorization Type  Medicare & BCBS    PT Start Time  1059    PT Stop Time  1141    PT Time Calculation (min)  42 min    Equipment Utilized During Treatment  --    Activity Tolerance  Patient tolerated treatment well    Behavior During Therapy  WFL for tasks assessed/performed       Past Medical History:  Diagnosis Date  . Allergic rhinitis   . Anxiety   . Arthritis    sees Dr. Gavin Bryant   . Cellulitis, periorbital 2006   left eye, due to MRSA, saw Dr. Erik Bryant   . Chronic kidney disease   . Complication of anesthesia   . COPD (chronic obstructive pulmonary disease) ([place])   . Cough   . Diabetes mellitus   . Dyspnea    with exertion  . GERD (gastroesophageal reflux disease)    OTC  Pepcid  . Hemorrhoids   . Hyperlipidemia   . Hypertension   . IBS (irritable bowel syndrome)   . Menopause   . 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 Pneumonia   . PONV (postoperative nausea and vomiting)   . Tubular adenoma of colon 12/2006    Past Surgical History:  Procedure Laterality Date  . APPENDECTOMY    . BACK SURGERY    . COLONOSCOPY  04-01-12   per Dr. Fuller Bryant, tubular adenoma, repeat in 5 yrs   . POLYPECTOMY    . TUBAL LIGATION      There were no vitals filed for this visit.  Subjective Assessment - 03/03/19 1105    Subjective  Pt. doing well today.       Pertinent History  pneumonia, MRSA 2006, HTN, HLD, GERD, DM, dyspnea, COPD, CKD, L periorbital cellulitis, RA    Diagnostic tests  11/02/18 lumbar xray: L4-5 fusion hardware appears to be in good position without complicating features. Stable advanced disc disease and facet disease in the lumbar spine.    Patient Stated Goals  "strengthen these legs, more endurance"    Currently in Pain?  No/denies    Pain Score  0-No pain    Multiple Pain Sites  No                       [facility] Adult PT Treatment/Exercise - 03/03/19 1110      Lumbar Exercises: Stretches   Piriformis Stretch  Right;Left;1 rep;30 seconds    Piriformis Stretch Limitations  KTOS      Lumbar Exercises: Sidelying   Clam  15 reps;3 seconds    Clam Limitations  yellow TB at knees       Knee/Hip Exercises: Aerobic   Tread Mill  Lvl 2.0, 6 min       Knee/Hip Exercises: Standing   SLS  B SLS 3 x 15" at counter; intermittent UE support at counter     SLS with Vectors  B SLS with opposite LE toe-touch to rubber bubbles in "clock" 2 x 15 each; intermittent UE support      Knee/Hip Exercises: Seated   Sit to Sand  1 set;without UE support   x 12 reps      Knee/Hip Exercises: Supine   Bridges with Ball Squeeze  15 reps;Both;Strengthening   3" hold   Bridges with Clamshell  Both;15 reps   isometric hip abd/ER into red TB at knees      Knee/Hip Exercises: Sidelying   Clams  B sidelying clam shell red TB at hips x 15 reps                PT Short Term Goals - 02/17/19 1106      PT SHORT TERM GOAL #1   Title  Patient to be independent with initial HEP.    Time  4    Period  Weeks    Status  Achieved    Target Date  02/17/19        PT Long Term Goals - 03/03/19 1126      PT LONG TERM GOAL #1   Title  Patient to be independent with advanced HEP.    Time  8    Period  Weeks    Status  On-going      PT LONG TERM GOAL #2   Title  Patient to demonstrate mild tightness in B HS, TFL, and hip  flexor/quad.     Time  8    Period  Weeks    Status  On-going      PT LONG TERM GOAL #3   Title  Patient to demonstrate 50% improvement in symmetrical weight shift, decreased lateral trunk lean, and improved fluidity of stepping with ambulation.     Time  8    Period  Weeks    Status  On-going      PT LONG TERM GOAL #4   Title  Patient to demonstrate 75% improvement in LE control and stability with ascending/descending 13 steps with 1 handrail.     Time  8    Period  Weeks    Status  Partially Met   03/03/19: notes ~ 60% improvement      PT LONG TERM GOAL #5   Title  Patient to demonstrate SLS for 30 sec on each LE without LOB.    Time  8    Status  Partially Met   03/03/19: ~ 15 sec B with intermittent support            Bryant - 03/03/19 1109    Clinical Impression Statement  [name] reporting she has been focusing on standing balance activities at home with HEP.  Pt. making progress toward LTG #4 noting ~ 60% improvement in stability while navigating stairs x 14 in session today.  tolerated all standing balance and LE strengthening activities well today in session.  Ended visit with pain noting she was pain free.  Progressing toward goals.      Rehab Potential  Good    Clinical Impairments Affecting Rehab Potential  pneumonia, MRSA 2006, HTN, HLD, GERD, DM, dyspnea, COPD, CKD, L periorbital cellulitis, RA    PT Treatment/Interventions  ADLs/Self Care Home Management;Cryotherapy;Electrical Stimulation;Functional mobility training;Stair training;Gait training;Ultrasound;Moist Heat;Therapeutic activities;Therapeutic exercise;Balance training;Neuromuscular re-education;Patient/family education;Orthotic Fit/Training;Passive range of motion;Scar mobilization;Manual techniques;Dry needling;Energy conservation;Splinting;Taping;Vasopneumatic Device  Patient will benefit from skilled therapeutic intervention in order to improve the following deficits and impairments:  Abnormal gait,  Decreased endurance, Decreased activity tolerance, Decreased strength, Pain, Difficulty walking, Decreased balance, Decreased range of motion, Improper body mechanics, Postural dysfunction, Impaired flexibility  Visit Diagnosis: Chronic bilateral low back pain without sciatica  Other abnormalities of gait and mobility  Other symptoms and signs involving the musculoskeletal system     Problem List Patient Active Problem List   Diagnosis Date Noted  . Loss of smell 02/02/2019  . Loss of taste 02/02/2019  . Degenerative spondylolisthesis 09/29/2018  . Chronic cough 09/09/2018  . Low back pain 06/09/2018  . Shoulder pain, bilateral 03/13/2018  . CKD (chronic kidney disease) stage 2, GFR 60-89 ml/min 07/03/2017  . IBS (irritable bowel syndrome) 07/03/2017  . Vitamin D deficiency 08/01/2015  . Mixed simple and mucopurulent chronic bronchitis ([place]) 06/11/2015  . Chorioretinal scar, 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  . Nuclear cataract, bilateral 06/16/2014  . Smoker 09/07/2010  . Hypothyroidism 03/29/2008  . ANEMIA 03/29/2008  . Arthropathy 01/07/2008  . Gout 10/07/2007  . Diabetes mellitus without complication ([place]) 74/93/5521  . Hyperlipemia 07/21/2007  . Essential hypertension 07/21/2007    Bess Harvest, PTA 03/03/19 1:11 PM   [facility] [address]  [place], [place], [address] Phone: [phone]   Fax:  [phone]  Name: Deborah Bryant MRN: [id number] Date of Birth: [birth year]  PHYSICAL THERAPY DISCHARGE SUMMARY  Visits from Start of Care: 7  Current functional level related to goals / functional outcomes: Unable to assess; patient did not return   Remaining deficits: Unable to assess   Education / Equipment: HEP  Bryant: Patient agrees to discharge.  Patient goals were partially met. Patient is being discharged due to not returning since the last visit.  ?????     Janene Harvey, PT, DPT 04/14/19 12:39  PM

## 2019-03-05 ENCOUNTER — Other Ambulatory Visit: Payer: Self-pay

## 2019-03-05 ENCOUNTER — Ambulatory Visit (INDEPENDENT_AMBULATORY_CARE_PROVIDER_SITE_OTHER): Payer: Medicare Other | Admitting: Family Medicine

## 2019-03-05 ENCOUNTER — Encounter: Payer: Self-pay | Admitting: Family Medicine

## 2019-03-05 VITALS — BP 132/76 | HR 75 | Temp 98.1°F | Wt 168.6 lb

## 2019-03-05 DIAGNOSIS — R05 Cough: Secondary | ICD-10-CM

## 2019-03-05 DIAGNOSIS — F1190 Opioid use, unspecified, uncomplicated: Secondary | ICD-10-CM | POA: Diagnosis not present

## 2019-03-05 DIAGNOSIS — G8929 Other chronic pain: Secondary | ICD-10-CM

## 2019-03-05 DIAGNOSIS — M545 Low back pain: Secondary | ICD-10-CM

## 2019-03-05 DIAGNOSIS — R053 Chronic cough: Secondary | ICD-10-CM

## 2019-03-05 MED ORDER — HYDROCODONE-ACETAMINOPHEN 10-325 MG PO TABS: 1.0000 | ORAL_TABLET | Freq: Four times a day (QID) | ORAL | 0 refills | Status: DC | PRN

## 2019-03-05 MED ORDER — PROMETHAZINE-CODEINE 6.25-10 MG/5ML PO SYRP: 5.0000 mL | ORAL_SOLUTION | Freq: Four times a day (QID) | ORAL | 0 refills | Status: DC | PRN

## 2019-03-05 NOTE — Progress Notes (Signed)
   Subjective:    Patient ID: Deborah Bryant, female    DOB: June 22, 1946, 73 y.o.   MRN: [id number]  HPI Here for pain management. She is doing well.  Indication for chronic opioid: low back pain and chronic cough Medication and dose: Norco 10-325 and Promethazine-codeine 6.25-10/5 ml syrup  # pills per month: 120 and 240 ml Last UDS date: 03-13-18                                                                                                                                       Opioid Treatment Agreement signed (Y/N): 03-13-18  Opioid Treatment Agreement last reviewed with patient:  03-05-19 [place] reviewed this encounter (include red flags):  03-05-19     Review of Systems  Constitutional: Negative.   Respiratory: Positive for cough.   Cardiovascular: Negative.   Musculoskeletal: Positive for back pain.       Objective:   Physical Exam Constitutional:      Appearance: Normal appearance.  Cardiovascular:     Rate and Rhythm: Normal rate and regular rhythm.     Pulses: Normal pulses.     Heart sounds: Normal heart sounds.  Pulmonary:     Effort: Pulmonary effort is normal.     Breath sounds: Normal breath sounds.  Neurological:     Mental Status: She is alert.           Assessment & Plan:  Pain management, meds were refilled.  Alysia Penna, MD

## 2019-03-10 ENCOUNTER — Ambulatory Visit: Payer: Medicare Other

## 2019-03-17 ENCOUNTER — Encounter: Payer: Medicare Other | Admitting: Physical Therapy

## 2019-04-05 ENCOUNTER — Other Ambulatory Visit: Payer: Self-pay | Admitting: Family Medicine

## 2019-04-06 ENCOUNTER — Other Ambulatory Visit: Payer: Self-pay | Admitting: Family Medicine

## 2019-04-06 NOTE — Telephone Encounter (Signed)
Last fill 03/05/19 Last OV 03/05/19  Ok to fill?

## 2019-04-06 NOTE — Telephone Encounter (Signed)
Want 3 month supply

## 2019-04-06 NOTE — Telephone Encounter (Signed)
Requested medication (s) are due for refill today: yes  Requested medication (s) are on the active medication list: yes  Last refill:  03/05/19  Future visit scheduled: no  Notes to clinic:  Medication not delegated to NT to refill   Requested Prescriptions  Pending Prescriptions Disp Refills   promethazine-codeine (PHENERGAN WITH CODEINE) 6.25-10 MG/5ML syrup 240 mL 0    Sig: Take 5 mLs by mouth every 6 (six) hours as needed for up to 30 days.     Off-Protocol Failed - 04/06/2019 10:12 AM      Failed - Medication not assigned to a protocol, review manually.      Passed - Valid encounter within last 12 months    Recent Outpatient Visits          1 month ago Chronic narcotic use   [place] [facility], Ishmael Holter, MD   2 months ago Essential hypertension   [facility], Ishmael Holter, MD   4 months ago Chronic narcotic use   [facility], Ishmael Holter, MD   4 months ago Fatigue, unspecified type   [facility], MD   5 months ago Other symptoms and signs involving the nervous system   [facility], Ishmael Holter, MD

## 2019-04-07 NOTE — Telephone Encounter (Signed)
She already has refills until 06-04-19

## 2019-04-07 NOTE — Telephone Encounter (Signed)
Sent to me in error. 

## 2019-04-08 ENCOUNTER — Encounter: Payer: Self-pay | Admitting: Family Medicine

## 2019-04-08 ENCOUNTER — Other Ambulatory Visit: Payer: Self-pay | Admitting: Family Medicine

## 2019-04-08 MED ORDER — PROMETHAZINE-CODEINE 6.25-10 MG/5ML PO SYRP: 5.0000 mL | ORAL_SOLUTION | Freq: Four times a day (QID) | ORAL | 0 refills | Status: AC | PRN

## 2019-04-08 NOTE — Telephone Encounter (Signed)
I sent in one refill on the cough syrup. I guess we will need to do this every month, but that's okay with me

## 2019-04-08 NOTE — Telephone Encounter (Signed)
Called the pharmacy and they stated that they did receive the rx dated for 03/05/2019 but no other rx came in for this med.  They are needing the rx for the promethazine with codeine to be sent in for refills for the next 2 months.  Dr. Sarajane Jews please advise. Thanks

## 2019-04-15 ENCOUNTER — Ambulatory Visit: Payer: Medicare Other | Admitting: Neurology

## 2019-05-10 ENCOUNTER — Encounter: Payer: Self-pay | Admitting: Family Medicine

## 2019-05-12 NOTE — Telephone Encounter (Signed)
Patient calling to check status of refill request. She is requesting a call back.

## 2019-05-12 NOTE — Telephone Encounter (Signed)
Dr. Fry please advise. Thanks  

## 2019-05-13 ENCOUNTER — Other Ambulatory Visit: Payer: Self-pay | Admitting: Family Medicine

## 2019-05-13 MED ORDER — PROMETHAZINE-CODEINE 6.25-10 MG/5ML PO SYRP: 5.0000 mL | ORAL_SOLUTION | Freq: Four times a day (QID) | ORAL | 0 refills | Status: DC | PRN

## 2019-05-13 NOTE — Telephone Encounter (Signed)
Done

## 2019-05-13 NOTE — Telephone Encounter (Signed)
Dr. Sarajane Jews please advise on the refill of the cough meds.  Thanks

## 2019-05-14 DIAGNOSIS — E119 Type 2 diabetes mellitus without complications: Secondary | ICD-10-CM | POA: Diagnosis not present

## 2019-06-02 DIAGNOSIS — M064 Inflammatory polyarthropathy: Secondary | ICD-10-CM | POA: Diagnosis not present

## 2019-06-02 DIAGNOSIS — E0865 Diabetes mellitus due to underlying condition with hyperglycemia: Secondary | ICD-10-CM | POA: Diagnosis not present

## 2019-06-02 DIAGNOSIS — M2550 Pain in unspecified joint: Secondary | ICD-10-CM | POA: Diagnosis not present

## 2019-06-02 DIAGNOSIS — Z6828 Body mass index (BMI) 28.0-28.9, adult: Secondary | ICD-10-CM | POA: Diagnosis not present

## 2019-06-02 DIAGNOSIS — M150 Primary generalized (osteo)arthritis: Secondary | ICD-10-CM | POA: Diagnosis not present

## 2019-06-02 DIAGNOSIS — E663 Overweight: Secondary | ICD-10-CM | POA: Diagnosis not present

## 2019-06-02 DIAGNOSIS — M1009 Idiopathic gout, multiple sites: Secondary | ICD-10-CM | POA: Diagnosis not present

## 2019-06-04 ENCOUNTER — Ambulatory Visit (INDEPENDENT_AMBULATORY_CARE_PROVIDER_SITE_OTHER): Payer: Medicare Other | Admitting: Family Medicine

## 2019-06-04 ENCOUNTER — Other Ambulatory Visit: Payer: Self-pay

## 2019-06-04 ENCOUNTER — Encounter: Payer: Self-pay | Admitting: Family Medicine

## 2019-06-04 DIAGNOSIS — R05 Cough: Secondary | ICD-10-CM | POA: Diagnosis not present

## 2019-06-04 DIAGNOSIS — G8929 Other chronic pain: Secondary | ICD-10-CM | POA: Diagnosis not present

## 2019-06-04 DIAGNOSIS — M545 Low back pain: Secondary | ICD-10-CM

## 2019-06-04 DIAGNOSIS — R053 Chronic cough: Secondary | ICD-10-CM

## 2019-06-04 DIAGNOSIS — F1190 Opioid use, unspecified, uncomplicated: Secondary | ICD-10-CM | POA: Diagnosis not present

## 2019-06-04 MED ORDER — PROMETHAZINE-CODEINE 6.25-10 MG/5ML PO SYRP: 5.0000 mL | ORAL_SOLUTION | Freq: Four times a day (QID) | ORAL | 2 refills | Status: DC | PRN

## 2019-06-04 MED ORDER — HYDROCODONE-ACETAMINOPHEN 10-325 MG PO TABS: 1.0000 | ORAL_TABLET | Freq: Four times a day (QID) | ORAL | 0 refills | Status: AC | PRN

## 2019-06-04 MED ORDER — HYDROCODONE-ACETAMINOPHEN 10-325 MG PO TABS: 1.0000 | ORAL_TABLET | Freq: Four times a day (QID) | ORAL | 0 refills | Status: DC | PRN

## 2019-06-04 NOTE — Progress Notes (Signed)
   Subjective:    Patient ID: Deborah Bryant, female    DOB: 04-13-1946, 73 y.o.   MRN: [id number]  HPI Virtual Visit via Telephone Note  I connected with the patient on 06/04/19 at 10:30 AM EDT by telephone and verified that I am speaking with the correct person using two identifiers. We attempted to connect virtually but we had technical difficulties with the audio and video.     I discussed the limitations, risks, security and privacy concerns of performing an evaluation and management service by telephone and the availability of in person appointments. I also discussed with the patient that there may be a patient responsible charge related to this service. The patient expressed understanding and agreed to proceed.  Location patient: home Location provider: work or home office Participants present for the call: patient, provider Patient did not have a visit in the prior 7 days to address this/these issue(s).   History of Present Illness: Here for pain management and cough management. She is doing well in general. She saw Dr. Lenna Gilford, her rheumatologist, this week and she has her on Hydroxychloroquine.  Indication for chronic opioid: low back pain and chronic cough Medication and dose: Norco10-325 and Promethazine-codeine 6.25-10/5 ml # pills per month: 120 and 240 ml Last UDS date: 03-13-18 Opioid Treatment Agreement signed (Y/N): 03-19-18 Opioid Treatment Agreement last reviewed with patient:  06-04-19 [place] reviewed this encounter (include red flags):  06-04-19    Observations/Objective: Patient sounds cheerful and well on the phone. I do not appreciate any SOB. Speech and thought processing are grossly intact. Patient reported vitals:  Assessment and Plan: Pain management, meds were refilled.  Alysia Penna, MD   Follow Up Instructions:     [phone] 5-10 [phone] 11-20 [phone] 21-30 I did not refer this patient for an OV in the next 24 hours for this/these issue(s).  I discussed  the assessment and treatment plan with the patient. The patient was provided an opportunity to ask questions and all were answered. The patient agreed with the plan and demonstrated an understanding of the instructions.   The patient was advised to call back or seek an in-person evaluation if the symptoms worsen or if the condition fails to improve as anticipated.  I provided 15 minutes of non-face-to-face time during this encounter.   Alysia Penna, MD    Review of Systems     Objective:   Physical Exam        Assessment & Plan:

## 2019-07-29 ENCOUNTER — Encounter: Payer: Self-pay | Admitting: Family Medicine

## 2019-07-30 MED ORDER — DILTIAZEM HCL ER COATED BEADS 240 MG PO CP24: 240.0000 mg | ORAL_CAPSULE | Freq: Every day | ORAL | 1 refills | Status: DC

## 2019-08-06 DIAGNOSIS — Z1231 Encounter for screening mammogram for malignant neoplasm of breast: Secondary | ICD-10-CM | POA: Diagnosis not present

## 2019-08-06 DIAGNOSIS — Z803 Family history of malignant neoplasm of breast: Secondary | ICD-10-CM | POA: Diagnosis not present

## 2019-08-06 LAB — HM MAMMOGRAPHY

## 2019-08-10 ENCOUNTER — Encounter: Payer: Self-pay | Admitting: Family Medicine

## 2019-08-17 LAB — HM MAMMOGRAPHY

## 2019-08-18 ENCOUNTER — Encounter: Payer: Self-pay | Admitting: Family Medicine

## 2019-08-24 ENCOUNTER — Encounter: Payer: Self-pay | Admitting: Family Medicine

## 2019-08-24 ENCOUNTER — Other Ambulatory Visit: Payer: Self-pay

## 2019-08-24 ENCOUNTER — Ambulatory Visit (INDEPENDENT_AMBULATORY_CARE_PROVIDER_SITE_OTHER): Payer: Medicare Other | Admitting: Family Medicine

## 2019-08-24 VITALS — BP 150/60 | HR 57 | Temp 97.6°F | Wt 158.4 lb

## 2019-08-24 DIAGNOSIS — Z23 Encounter for immunization: Secondary | ICD-10-CM | POA: Diagnosis not present

## 2019-08-24 DIAGNOSIS — R432 Parageusia: Secondary | ICD-10-CM

## 2019-08-24 DIAGNOSIS — R430 Anosmia: Secondary | ICD-10-CM | POA: Diagnosis not present

## 2019-08-24 DIAGNOSIS — E559 Vitamin D deficiency, unspecified: Secondary | ICD-10-CM | POA: Diagnosis not present

## 2019-08-24 DIAGNOSIS — R413 Other amnesia: Secondary | ICD-10-CM

## 2019-08-24 DIAGNOSIS — E782 Mixed hyperlipidemia: Secondary | ICD-10-CM

## 2019-08-24 DIAGNOSIS — E538 Deficiency of other specified B group vitamins: Secondary | ICD-10-CM

## 2019-08-24 DIAGNOSIS — K589 Irritable bowel syndrome without diarrhea: Secondary | ICD-10-CM | POA: Diagnosis not present

## 2019-08-24 DIAGNOSIS — N182 Chronic kidney disease, stage 2 (mild): Secondary | ICD-10-CM | POA: Diagnosis not present

## 2019-08-24 DIAGNOSIS — E039 Hypothyroidism, unspecified: Secondary | ICD-10-CM | POA: Diagnosis not present

## 2019-08-24 DIAGNOSIS — E119 Type 2 diabetes mellitus without complications: Secondary | ICD-10-CM | POA: Diagnosis not present

## 2019-08-24 DIAGNOSIS — I10 Essential (primary) hypertension: Secondary | ICD-10-CM | POA: Diagnosis not present

## 2019-08-24 DIAGNOSIS — Z0000 Encounter for general adult medical examination without abnormal findings: Secondary | ICD-10-CM

## 2019-08-24 LAB — HEPATIC FUNCTION PANEL
ALT: 14 U/L (ref 0–35)
AST: 15 U/L (ref 0–37)
Albumin: 4.4 g/dL (ref 3.5–5.2)
Alkaline Phosphatase: 151 U/L — ABNORMAL HIGH (ref 39–117)
Bilirubin, Direct: 0.1 mg/dL (ref 0.0–0.3)
Total Bilirubin: 0.6 mg/dL (ref 0.2–1.2)
Total Protein: 7 g/dL (ref 6.0–8.3)

## 2019-08-24 LAB — CBC WITH DIFFERENTIAL/PLATELET
Basophils Absolute: 0.1 10*3/uL (ref 0.0–0.1)
Basophils Relative: 1.1 % (ref 0.0–3.0)
Eosinophils Absolute: 0.1 10*3/uL (ref 0.0–0.7)
Eosinophils Relative: 0.6 % (ref 0.0–5.0)
HCT: 45.2 % (ref 36.0–46.0)
Hemoglobin: 14.9 g/dL (ref 12.0–15.0)
Lymphocytes Relative: 10 % — ABNORMAL LOW (ref 12.0–46.0)
Lymphs Abs: 1.1 10*3/uL (ref 0.7–4.0)
MCHC: 33 g/dL (ref 30.0–36.0)
MCV: 90.6 fl (ref 78.0–100.0)
Monocytes Absolute: 0.8 10*3/uL (ref 0.1–1.0)
Monocytes Relative: 7.4 % (ref 3.0–12.0)
Neutro Abs: 9 10*3/uL — ABNORMAL HIGH (ref 1.4–7.7)
Neutrophils Relative %: 80.9 % — ABNORMAL HIGH (ref 43.0–77.0)
Platelets: 324 10*3/uL (ref 150.0–400.0)
RBC: 4.99 Mil/uL (ref 3.87–5.11)
RDW: 15 % (ref 11.5–15.5)
WBC: 11.1 10*3/uL — ABNORMAL HIGH (ref 4.0–10.5)

## 2019-08-24 LAB — LIPID PANEL
Cholesterol: 125 mg/dL (ref 0–200)
HDL: 65.2 mg/dL (ref >39.00)
LDL Cholesterol: 41 mg/dL (ref 0–99)
NonHDL: 60.04
Total CHOL/HDL Ratio: 2
Triglycerides: 93 mg/dL (ref 0.0–149.0)
VLDL: 18.6 mg/dL (ref 0.0–40.0)

## 2019-08-24 LAB — POC URINALSYSI DIPSTICK (AUTOMATED)
Bilirubin, UA: NEGATIVE
Blood, UA: NEGATIVE
Glucose, UA: NEGATIVE
Ketones, UA: NEGATIVE
Leukocytes, UA: NEGATIVE
Nitrite, UA: NEGATIVE
Protein, UA: POSITIVE — AB
Spec Grav, UA: 1.015 (ref 1.010–1.025)
Urobilinogen, UA: 0.2 E.U./dL
pH, UA: 7 (ref 5.0–8.0)

## 2019-08-24 LAB — BASIC METABOLIC PANEL
BUN: 13 mg/dL (ref 6–23)
CO2: 29 mEq/L (ref 19–32)
Calcium: 9.6 mg/dL (ref 8.4–10.5)
Chloride: 97 mEq/L (ref 96–112)
Creatinine, Ser: 0.82 mg/dL (ref 0.40–1.20)
GFR: 68.33 mL/min (ref >60.00)
Glucose, Bld: 126 mg/dL — ABNORMAL HIGH (ref 70–99)
Potassium: 3.8 mEq/L (ref 3.5–5.1)
Sodium: 137 mEq/L (ref 135–145)

## 2019-08-24 LAB — TSH: TSH: 2.84 u[IU]/mL (ref 0.35–4.50)

## 2019-08-24 LAB — HEMOGLOBIN A1C: Hgb A1c MFr Bld: 6.4 % (ref 4.6–6.5)

## 2019-08-24 LAB — VITAMIN D 25 HYDROXY (VIT D DEFICIENCY, FRACTURES): VITD: 42.05 ng/mL (ref 30.00–100.00)

## 2019-08-24 LAB — VITAMIN B12: Vitamin B-12: >1500 pg/mL — ABNORMAL HIGH (ref 211–911)

## 2019-08-24 MED ORDER — GLIPIZIDE ER 5 MG PO TB24: 5.0000 mg | ORAL_TABLET | Freq: Every day | ORAL | 3 refills | Status: DC

## 2019-08-24 MED ORDER — ATORVASTATIN CALCIUM 40 MG PO TABS: 40.0000 mg | ORAL_TABLET | Freq: Every day | ORAL | 3 refills | Status: DC

## 2019-08-24 MED ORDER — ALLOPURINOL 300 MG PO TABS: 300.0000 mg | ORAL_TABLET | Freq: Every day | ORAL | 3 refills | Status: DC

## 2019-08-24 MED ORDER — HYDROCHLOROTHIAZIDE 25 MG PO TABS: 25.0000 mg | ORAL_TABLET | Freq: Every day | ORAL | 3 refills | Status: DC

## 2019-08-24 MED ORDER — DICLOFENAC SODIUM 75 MG PO TBEC: 75.0000 mg | DELAYED_RELEASE_TABLET | Freq: Every day | ORAL | 3 refills | Status: DC

## 2019-08-24 MED ORDER — DILTIAZEM HCL ER COATED BEADS 240 MG PO CP24: 240.0000 mg | ORAL_CAPSULE | Freq: Every day | ORAL | 3 refills | Status: DC

## 2019-08-24 NOTE — Telephone Encounter (Signed)
Last filled 09/30/18

## 2019-08-24 NOTE — Progress Notes (Signed)
Subjective:    Patient ID: CAYSEN SIMONI, female    DOB: Jan 15, 1946, 73 y.o.   MRN: [id number]  HPI Here for a number of issues. Her BP has been stable at home. Her IBS has been stable. Her back feels better after her spinal surgery last October. She will follow up with Dr. Trenton Gammon next week. Her main concerns are about her neurological system. Since the surgery she still has no sense of smell or taste, and we felt that these main have been the effects of anesthesia. She had a normal brain MRI on 11-01-18, and she saw Dr. Tomi Likens in [place], but no etiology was found. Since then she feels she has deteriorated a bit. She says her focus is dulled and she often feels "fuzzy" as she tries to perform regular daily tasks like cooking or cleaning or driving a car. She has to stop and think hard about things that she used to do without thinking at all. She feels her memory is slipping, and that she is a little unsteady on her feet. She has not fallen. Her appetite and sleep are preserved. She sees Dr. Trudie Reed for her arthritis. Her mammogram this fall showed calcifications, and she is scheduled for a needle biopsy of the breast next week.    Review of Systems  Constitutional: Negative.   HENT: Negative.   Eyes: Negative.   Respiratory: Negative.   Cardiovascular: Negative.   Gastrointestinal: Negative.   Genitourinary: Negative for decreased urine volume, difficulty urinating, dyspareunia, dysuria, enuresis, flank pain, frequency, hematuria, pelvic pain and urgency.  Musculoskeletal: Negative.   Skin: Negative.   Neurological: Positive for dizziness.  Psychiatric/Behavioral: Negative.        Objective:   Physical Exam Constitutional:      General: She is not in acute distress.    Appearance: She is well-developed.  HENT:     Head: Normocephalic and atraumatic.     Right Ear: External ear normal.     Left Ear: External ear normal.     Nose: Nose normal.     Mouth/Throat:     Pharynx: No  oropharyngeal exudate.  Eyes:     General: No scleral icterus.    Conjunctiva/sclera: Conjunctivae normal.     Pupils: Pupils are equal, round, and reactive to light.  Neck:     Musculoskeletal: Normal range of motion and neck supple.     Thyroid: No thyromegaly.     Vascular: No JVD.  Cardiovascular:     Rate and Rhythm: Normal rate and regular rhythm.     Heart sounds: Normal heart sounds. No murmur. No friction rub. No gallop.   Pulmonary:     Effort: Pulmonary effort is normal. No respiratory distress.     Breath sounds: Normal breath sounds. No wheezing or rales.  Chest:     Chest wall: No tenderness.  Abdominal:     General: Bowel sounds are normal. There is no distension.     Palpations: Abdomen is soft. There is no mass.     Tenderness: There is no abdominal tenderness. There is no guarding or rebound.  Musculoskeletal: Normal range of motion.        General: No tenderness.  Lymphadenopathy:     Cervical: No cervical adenopathy.  Skin:    General: Skin is warm and dry.     Findings: No erythema or rash.  Neurological:     Mental Status: She is alert and oriented to person, place, and time.  Cranial Nerves: No cranial nerve deficit.     Motor: No abnormal muscle tone.     Coordination: Coordination normal.     Deep Tendon Reflexes: Reflexes are normal and symmetric. Reflexes normal.  Psychiatric:        Behavior: Behavior normal.        Thought Content: Thought content normal.        Judgment: Judgment normal.           Assessment & Plan:  Her HTN and arthritis and IBS and are stable. Get fasting labs today to check an A1c, lipids, renal function, etc. Given a flu shot. She is past due for a colonoscopy so we will contact the GI office. We will refer her to Neurology for the memory issues and the loss of taste of smell.  Alysia Penna, MD

## 2019-08-25 DIAGNOSIS — M4310 Spondylolisthesis, site unspecified: Secondary | ICD-10-CM | POA: Diagnosis not present

## 2019-08-25 DIAGNOSIS — I10 Essential (primary) hypertension: Secondary | ICD-10-CM | POA: Diagnosis not present

## 2019-08-25 DIAGNOSIS — Z6829 Body mass index (BMI) 29.0-29.9, adult: Secondary | ICD-10-CM | POA: Diagnosis not present

## 2019-08-25 MED ORDER — DIAZEPAM 5 MG PO TABS: 5.0000 mg | ORAL_TABLET | Freq: Three times a day (TID) | ORAL | 5 refills | Status: DC | PRN

## 2019-08-25 NOTE — Telephone Encounter (Signed)
Done

## 2019-08-30 ENCOUNTER — Other Ambulatory Visit: Payer: Self-pay | Admitting: Radiology

## 2019-08-30 ENCOUNTER — Encounter: Payer: Self-pay | Admitting: Family Medicine

## 2019-08-30 DIAGNOSIS — R920 Mammographic microcalcification found on diagnostic imaging of breast: Secondary | ICD-10-CM | POA: Diagnosis not present

## 2019-08-30 DIAGNOSIS — R921 Mammographic calcification found on diagnostic imaging of breast: Secondary | ICD-10-CM | POA: Diagnosis not present

## 2019-08-30 LAB — HM MAMMOGRAPHY

## 2019-08-31 ENCOUNTER — Encounter: Payer: Self-pay | Admitting: Family Medicine

## 2019-09-06 ENCOUNTER — Other Ambulatory Visit: Payer: Self-pay

## 2019-09-06 ENCOUNTER — Encounter: Payer: Self-pay | Admitting: Family Medicine

## 2019-09-06 ENCOUNTER — Telehealth (INDEPENDENT_AMBULATORY_CARE_PROVIDER_SITE_OTHER): Payer: Medicare Other | Admitting: Family Medicine

## 2019-09-06 DIAGNOSIS — G8929 Other chronic pain: Secondary | ICD-10-CM

## 2019-09-06 DIAGNOSIS — R05 Cough: Secondary | ICD-10-CM | POA: Diagnosis not present

## 2019-09-06 DIAGNOSIS — R053 Chronic cough: Secondary | ICD-10-CM

## 2019-09-06 DIAGNOSIS — F1190 Opioid use, unspecified, uncomplicated: Secondary | ICD-10-CM | POA: Diagnosis not present

## 2019-09-06 DIAGNOSIS — I10 Essential (primary) hypertension: Secondary | ICD-10-CM | POA: Diagnosis not present

## 2019-09-06 DIAGNOSIS — M545 Low back pain: Secondary | ICD-10-CM | POA: Diagnosis not present

## 2019-09-06 MED ORDER — PROMETHAZINE-CODEINE 6.25-10 MG/5ML PO SYRP: 5.0000 mL | ORAL_SOLUTION | Freq: Four times a day (QID) | ORAL | 0 refills | Status: DC | PRN

## 2019-09-06 MED ORDER — HYDROCODONE-ACETAMINOPHEN 5-325 MG PO TABS: 1.0000 | ORAL_TABLET | Freq: Four times a day (QID) | ORAL | 0 refills | Status: DC | PRN

## 2019-09-06 MED ORDER — LABETALOL HCL 100 MG PO TABS: 100.0000 mg | ORAL_TABLET | Freq: Two times a day (BID) | ORAL | 2 refills | Status: DC

## 2019-09-06 NOTE — Progress Notes (Signed)
Virtual Visit via Telephone Note  I connected with the patient on 09/06/19 at  3:00 PM EDT by telephone and verified that I am speaking with the correct person using two identifiers. We attempted to connect virtually but we had technical difficulties with the audio and video.     I discussed the limitations, risks, security and privacy concerns of performing an evaluation and management service by telephone and the availability of in person appointments. I also discussed with the patient that there may be a patient responsible charge related to this service. The patient expressed understanding and agreed to proceed.  Location patient: home Location provider: work or home office Participants present for the call: patient, provider Patient did not have a visit in the prior 7 days to address this/these issue(s).   History of Present Illness: Here for pain and cough management, she is doing fairly well. She asks to reduce the dose of her Norco because she does not want her body to become habituated to it. The cough is stable. Also she is worried about her BP. The systolic value stays in the range of 150-160 while the diastolic is normal.  Indication for chronic opioid: low back pain and chronic cough Medication and dose: Norco 10-325 and Promethazine-codeine 6.25-10/5 ml  # pills per month: 120 and 240 ml  Last UDS date: 03-13-18 Opioid Treatment Agreement signed (Y/N): 03-19-18 Opioid Treatment Agreement last reviewed with patient:  09-06-19 [facility] reviewed this encounter (include red flags):  09-06-19    Observations/Objective: Patient sounds cheerful and well on the phone. I do not appreciate any SOB. Speech and thought processing are grossly intact. Patient reported vitals:  Assessment and Plan: She is doing well. Cough syrup is refilled. We will decrease the Norco to 5-325 as needed. For HTN we will add Labetalol 100 mg BID to her regimen. Recheck in 3-4 weeks.  Alysia Penna,  MD   Follow Up Instructions:     [phone] 5-10 [phone] 11-20 [phone] 21-30 I did not refer this patient for an OV in the next 24 hours for this/these issue(s).  I discussed the assessment and treatment plan with the patient. The patient was provided an opportunity to ask questions and all were answered. The patient agreed with the plan and demonstrated an understanding of the instructions.   The patient was advised to call back or seek an in-person evaluation if the symptoms worsen or if the condition fails to improve as anticipated.  I provided 18 minutes of non-face-to-face time during this encounter.   Alysia Penna, MD

## 2019-09-07 ENCOUNTER — Encounter: Payer: Self-pay | Admitting: Family Medicine

## 2019-09-07 DIAGNOSIS — R4182 Altered mental status, unspecified: Secondary | ICD-10-CM | POA: Diagnosis not present

## 2019-09-07 DIAGNOSIS — I129 Hypertensive chronic kidney disease with stage 1 through stage 4 chronic kidney disease, or unspecified chronic kidney disease: Secondary | ICD-10-CM | POA: Diagnosis not present

## 2019-09-07 DIAGNOSIS — R432 Parageusia: Secondary | ICD-10-CM | POA: Diagnosis not present

## 2019-09-07 DIAGNOSIS — Z720 Tobacco use: Secondary | ICD-10-CM | POA: Diagnosis not present

## 2019-09-07 DIAGNOSIS — Z6831 Body mass index (BMI) 31.0-31.9, adult: Secondary | ICD-10-CM | POA: Diagnosis not present

## 2019-09-07 DIAGNOSIS — N182 Chronic kidney disease, stage 2 (mild): Secondary | ICD-10-CM | POA: Diagnosis not present

## 2019-09-08 MED ORDER — HYDROCODONE-ACETAMINOPHEN 5-325 MG PO TABS: 1.0000 | ORAL_TABLET | Freq: Four times a day (QID) | ORAL | 0 refills | Status: DC | PRN

## 2019-09-08 NOTE — Telephone Encounter (Signed)
First, the hydrocodone was my mistake. I just sent in 3 months of this with #120 at a time. Please call the pharmacy to cancel the remaining rx for #30. Second, the promethazine-codeine will be handled just like the hydrocodone. There are no "refills" but she can still refill it until November

## 2019-09-20 ENCOUNTER — Other Ambulatory Visit: Payer: Self-pay

## 2019-09-21 ENCOUNTER — Encounter: Payer: Self-pay | Admitting: Family Medicine

## 2019-09-21 ENCOUNTER — Ambulatory Visit (INDEPENDENT_AMBULATORY_CARE_PROVIDER_SITE_OTHER): Payer: Medicare Other | Admitting: Family Medicine

## 2019-09-21 VITALS — BP 140/82 | HR 82 | Temp 98.4°F | Wt 158.6 lb

## 2019-09-21 DIAGNOSIS — M25561 Pain in right knee: Secondary | ICD-10-CM | POA: Diagnosis not present

## 2019-09-21 MED ORDER — PREDNISONE 10 MG PO TABS: ORAL_TABLET | ORAL | 0 refills | Status: DC

## 2019-09-21 NOTE — Progress Notes (Signed)
   Subjective:    Patient ID: Deborah Bryant, female    DOB: 21-Jan-1946, 73 y.o.   MRN: [id number]  HPI Here for 2 weeks of pain in the lateral right knee. She has had arthritis in both knees for years, but this pain is different. It started while standing in her kitchen, and as she turned her upper body to reach for something she felt a "pop" and a sudden sharp pain in the knee. The pain has persisted since then, although ice and her usual Hydrocodone help a little. No locking or giving way. No swelling.    Review of Systems  Constitutional: Negative.   Respiratory: Negative.   Cardiovascular: Negative.   Musculoskeletal: Positive for arthralgias.       Objective:   Physical Exam Constitutional:      Appearance: Normal appearance. She is not ill-appearing.  Cardiovascular:     Rate and Rhythm: Normal rate and regular rhythm.     Pulses: Normal pulses.     Heart sounds: Normal heart sounds.  Pulmonary:     Effort: Pulmonary effort is normal.     Breath sounds: Normal breath sounds.  Musculoskeletal:     Comments: The right knee appears normal with no swelling. It is tender in the lateral joint space. ROM is full with crepitus present. Anterior drawer is negative   Neurological:     Mental Status: She is alert.           Assessment & Plan:  She has likely sustained a minor tear of a meniscus, this on top of some osteoarthritis. I suggested she wear an elastic support sleeve and ice it several times a day. She will take a 15 day taper of Prednisone to reduce inflammation. Recheck prn.  Alysia Penna, MD

## 2019-10-05 ENCOUNTER — Ambulatory Visit: Payer: Medicare Other | Admitting: Neurology

## 2019-10-05 ENCOUNTER — Encounter: Payer: Self-pay | Admitting: Family Medicine

## 2019-10-07 ENCOUNTER — Encounter: Payer: Self-pay | Admitting: Family Medicine

## 2019-10-11 MED ORDER — PROMETHAZINE-CODEINE 6.25-10 MG/5ML PO SYRP: 5.0000 mL | ORAL_SOLUTION | Freq: Four times a day (QID) | ORAL | 0 refills | Status: DC | PRN

## 2019-10-11 NOTE — Telephone Encounter (Signed)
I sent in the refill.

## 2019-10-11 NOTE — Telephone Encounter (Signed)
Okay for refill? Please advise 

## 2019-10-13 NOTE — Telephone Encounter (Signed)
I already sent this in on Monday

## 2019-10-13 NOTE — Telephone Encounter (Signed)
Okay for refill?  

## 2019-10-18 ENCOUNTER — Encounter: Payer: Self-pay | Admitting: Family Medicine

## 2019-11-01 ENCOUNTER — Encounter: Payer: Self-pay | Admitting: Neurology

## 2019-11-01 ENCOUNTER — Other Ambulatory Visit: Payer: Self-pay

## 2019-11-01 ENCOUNTER — Ambulatory Visit (INDEPENDENT_AMBULATORY_CARE_PROVIDER_SITE_OTHER): Payer: Medicare Other | Admitting: Neurology

## 2019-11-01 VITALS — BP 153/74 | HR 59 | Temp 97.6°F | Ht 62.0 in | Wt 160.5 lb

## 2019-11-01 DIAGNOSIS — R410 Disorientation, unspecified: Secondary | ICD-10-CM

## 2019-11-01 DIAGNOSIS — R430 Anosmia: Secondary | ICD-10-CM | POA: Diagnosis not present

## 2019-11-01 DIAGNOSIS — R432 Parageusia: Secondary | ICD-10-CM

## 2019-11-01 NOTE — Patient Instructions (Signed)
[place] at [facility] [place], [place] [address]  [phone]

## 2019-11-01 NOTE — Progress Notes (Signed)
PATIENT: Deborah Bryant DOB: Feb 16, 1946  Chief Complaint  Patient presents with  . Memory Loss    MMSE 30/30 - 15 animals.  She is here with her husband, Deborah Bryant.  She is concerned about memory changes and forgetfulness.   . Change in smell and taste    Reports changes started in October 2019 after being put to sleep for surgery.  Marland Kitchen PCP    Laurey Morale, MD     HISTORICAL  Deborah Bryant is a 73 year old female, seen in request by her primary care physician Dr. Alysia Penna for evaluation of memory loss, she is concerned about her heart at today's visit on November 01, 2019.  I have reviewed and summarized the referring note from the referring physician.  She has past medical history of hypertension, hyperlipidemia, diabetes, COPD, rheumatoid arthritis.  She is a retired Engineer, maintenance of a company, was highly functioning, grandmother suffered dementia in her late 71s.  She underwent bilateral L4-5 decompressive laminectomy with bilateral L4-5 decompressive craniotomy on October 09/29/2018 by Dr. Deri Fuelling, postsurgical she recovered well for the initial 2 weeks.  Around early November 2019, she woke up 1 morning noticed that she has lost her taste and smell, stayed that way ever since, in addition, she noticed loud pulsating sound in her ear, numbness tingling at her feet due to diabetic peripheral neuropathy, she denies significant memory loss, but she noticed some personality change, she used to be able to multitask, highly functioning, but now she does not have much appetite, much scared, even scared to drive, when she was paying her bill, even with calculator, she has trouble figuring out the numbers.  But she denies significant memory loss.  She also noticed mild loss of dextrality of bilateral hands, she has mild gait abnormality due to right knee pain.   She was seen by [facility] neurologist Dr. Merry Proud in March 2020, laboratory evaluations in September 2020 showed normal B12, A1c,  vitamin D, TSH, no significant abnormality on CMP and CBC.   She has mild sleep difficulty, but did not report REM sleep disorder, symptoms are gradually getting worse.   I personally reviewed MRI of the brain with without contrast November 2019: Mild generalized atrophy, supratentorium small vessel disease.  REVIEW OF SYSTEMS: Full 14 system review of systems performed and notable only for as above All other review of systems were negative.  ALLERGIES: Allergies  Allergen Reactions  . Losartan Shortness Of Breath  . Benazepril Cough  . Lasix [Furosemide] Other (See Comments)    Rash, changes in eyesight  . Levaquin [Levofloxacin] Other (See Comments)    Tendon and muscle pain  . Phenobarbital Nausea And Vomiting  . Tape Rash    Can use paper tape    HOME MEDICATIONS: Current Outpatient Medications  Medication Sig Dispense Refill  . allopurinol (ZYLOPRIM) 300 MG tablet Take 1 tablet (300 mg total) by mouth daily. 90 tablet 3  . Ascorbic Acid (VITAMIN C) 1000 MG tablet Take 1,000 mg by mouth daily.    Marland Kitchen aspirin EC 81 MG tablet Take 81 mg by mouth daily.    Marland Kitchen atorvastatin (LIPITOR) 40 MG tablet Take 1 tablet (40 mg total) by mouth daily. 90 tablet 3  . Cholecalciferol (VITAMIN D) 2000 units CAPS Take 2,000 Units by mouth daily.     . diazepam (VALIUM) 5 MG tablet Take 1-2 tablets (5-10 mg total) by mouth every 8 (eight) hours as needed for muscle spasms. 90 tablet  5  . diclofenac (VOLTAREN) 75 MG EC tablet Take 1 tablet (75 mg total) by mouth daily. 90 tablet 3  . diclofenac sodium (VOLTAREN) 1 % GEL Apply 1 application topically at bedtime.    Marland Kitchen diltiazem (DILTIAZEM CD) 240 MG 24 hr capsule Take 1 capsule (240 mg total) by mouth daily. 90 capsule 3  . famotidine (PEPCID) 10 MG tablet Take 10 mg by mouth 2 (two) times daily.    Marland Kitchen glipiZIDE (GLUCOTROL XL) 5 MG 24 hr tablet Take 1 tablet (5 mg total) by mouth daily with breakfast. 90 tablet 3  . hydrochlorothiazide (HYDRODIURIL) 25  MG tablet Take 1 tablet (25 mg total) by mouth daily. 90 tablet 3  . [START ON 11/08/2019] HYDROcodone-acetaminophen (NORCO) 5-325 MG tablet Take 1 tablet by mouth every 6 (six) hours as needed for moderate pain. 120 tablet 0  . hydroxychloroquine (PLAQUENIL) 200 MG tablet Take 200 mg by mouth daily.     . polyethylene glycol powder (GLYCOLAX/MIRALAX) powder Take 8.5-17 g by mouth at bedtime.     Derrill Memo ON 11/06/2019] promethazine-codeine (PHENERGAN WITH CODEINE) 6.25-10 MG/5ML syrup Take 5 mLs by mouth every 6 (six) hours as needed. 240 mL 0  . saccharomyces boulardii (FLORASTOR) 250 MG capsule Take 250 mg by mouth daily.     No current facility-administered medications for this visit.     PAST MEDICAL HISTORY: Past Medical History:  Diagnosis Date  . Allergic rhinitis   . Anxiety   . Arthritis    sees Dr. Gavin Pound   . Cellulitis, periorbital 2006   left eye, due to MRSA, saw Dr. Erik Obey   . Chronic kidney disease   . Complication of anesthesia   . COPD (chronic obstructive pulmonary disease) ([place])   . Cough   . Diabetes mellitus   . Dyspnea    with exertion  . GERD (gastroesophageal reflux disease)    OTC  Pepcid  . Hemorrhoids   . Hyperlipidemia   . Hypertension   . IBS (irritable bowel syndrome)   . Loss of smell   . Loss of taste   . Memory changes   . Menopause   . 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 Pneumonia   . PONV (postoperative nausea and vomiting)   . Tubular adenoma of colon 12/2006    PAST SURGICAL HISTORY: Past Surgical History:  Procedure Laterality Date  . APPENDECTOMY    . BACK SURGERY    . COLONOSCOPY  04-01-12   per Dr. Fuller Plan, tubular adenoma, repeat in 5 yrs   . POLYPECTOMY    . TUBAL LIGATION      FAMILY HISTORY: Family History  Problem Relation Age of Onset  . Alcohol abuse Mother 55       MVA  . Diabetes Mother   . Heart disease Father 29       MI  . Heart disease Brother   . Diabetes  Brother   . Arthritis Sister   . Dementia Maternal Grandfather     SOCIAL HISTORY: Social History   Socioeconomic History  . Marital status: Married    Spouse name: Deborah Bryant  . Number of children: 1  . Years of education: college  . Highest education level: Bachelor's degree (e.g., BA, AB, BS)  Occupational History    Employer: RETIRED  Social Needs  . Financial resource strain: Not on file  . Food insecurity    Worry: Not on file  Inability: Not on file  . Transportation needs    Medical: Not on file    Non-medical: Not on file  Tobacco Use  . Smoking status: Current Every Day Smoker    Packs/day: 1.00    Years: 1.00    Pack years: 1.00    Types: Cigarettes  . Smokeless tobacco: Never Used  Substance and Sexual Activity  . Alcohol use: Not Currently    Alcohol/week: 0.0 standard drinks  . Drug use: Never  . Sexual activity: Yes    Birth control/protection: Post-menopausal  Lifestyle  . Physical activity    Days per week: Not on file    Minutes per session: Not on file  . Stress: Not on file  Relationships  . Social Herbalist on phone: Not on file    Gets together: Not on file    Attends religious service: Not on file    Active member of club or organization: Not on file    Attends meetings of clubs or organizations: Not on file    Relationship status: Not on file  . Intimate partner violence    Fear of current or ex partner: Not on file    Emotionally abused: Not on file    Physically abused: Not on file    Forced sexual activity: Not on file  Other Topics Concern  . Not on file  Social History Narrative   Patient is right-handed.she lives with her husband in a one level home. She drinks decaf coffee, 2 cups a day. One cup iced teas per day. She goes to the gym 3 x a week and is doing P/T 1 x a week.       PHYSICAL EXAM   Vitals:   11/01/19 1303  BP: (!) 153/74  Pulse: (!) 59  Temp: 97.6 F (36.4 C)  Weight: 160 lb 8 oz (72.8 kg)   Height: 5\' 2"  (1.575 m)    Not recorded      Body mass index is 29.36 kg/m.  PHYSICAL EXAMNIATION:  Gen: NAD, conversant, well nourised, well groomed                     Cardiovascular: Regular rate rhythm, no peripheral edema, warm, nontender. Eyes: Conjunctivae clear without exudates or hemorrhage Neck: Supple, no carotid bruits. Pulmonary: Clear to auscultation bilaterally   NEUROLOGICAL EXAM:  MENTAL STATUS: MMSE - Mini Mental State Exam 11/01/2019  Orientation to time 5  Orientation to Place 5  Registration 3  Attention/ Calculation 5  Recall 3  Language- name 2 objects 2  Language- repeat 1  Language- follow 3 step command 3  Language- read & follow direction 1  Write a sentence 1  Copy design 1  Total score 30  Animal naming 15   CRANIAL NERVES: CN II: Visual fields are full to confrontation.  Pupils are round equal and briskly reactive to light. CN III, IV, VI: extraocular movement are normal. No ptosis. CN V: Facial sensation is intact to pinprick in all 3 divisions bilaterally. Corneal responses are intact.  CN VII: Face is symmetric with normal eye closure and smile. CN VIII: Hearing is normal to causal conversation. CN IX, X: Palate elevates symmetrically. Phonation is normal. CN XI: Head turning and shoulder shrug are intact CN XII: Tongue is midline with normal movements and no atrophy.  MOTOR: There is no pronator drift of out-stretched arms. Muscle bulk and tone are normal. Muscle strength is normal.  REFLEXES:  Reflexes are 2+ and symmetric at the biceps, triceps, knees, and ankles. Plantar responses are flexor.  SENSORY: Intact to light touch, pinprick, positional sensation and vibratory sensation are intact in fingers and toes.  COORDINATION: Rapid alternating movements and fine finger movements are intact. There is no dysmetria on finger-to-nose and heel-knee-shin.    GAIT/STANCE: Posture is normal.  Mildly antalgic   DIAGNOSTIC DATA  (LABS, IMAGING, TESTING) - I reviewed patient records, labs, notes, testing and imaging myself where available.   ASSESSMENT AND PLAN  Deborah Bryant is a 73 y.o. 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  After discussed with patient and her husband, will refer her to [facility] clinic for further evaluation.    Marcial Pacas, M.D. Ph.D.  [facility] [address], [place], [place] [address] Ph: [phone] Fax: [phone]  CC: Laurey Morale, MD

## 2019-11-19 DIAGNOSIS — E119 Type 2 diabetes mellitus without complications: Secondary | ICD-10-CM | POA: Diagnosis not present

## 2019-11-19 DIAGNOSIS — H2512 Age-related nuclear cataract, left eye: Secondary | ICD-10-CM | POA: Diagnosis not present

## 2019-12-06 ENCOUNTER — Telehealth (INDEPENDENT_AMBULATORY_CARE_PROVIDER_SITE_OTHER): Payer: Medicare Other | Admitting: Family Medicine

## 2019-12-06 ENCOUNTER — Encounter: Payer: Self-pay | Admitting: Family Medicine

## 2019-12-06 ENCOUNTER — Other Ambulatory Visit: Payer: Self-pay

## 2019-12-06 DIAGNOSIS — M545 Low back pain, unspecified: Secondary | ICD-10-CM

## 2019-12-06 DIAGNOSIS — R053 Chronic cough: Secondary | ICD-10-CM

## 2019-12-06 DIAGNOSIS — F1190 Opioid use, unspecified, uncomplicated: Secondary | ICD-10-CM | POA: Diagnosis not present

## 2019-12-06 DIAGNOSIS — G8929 Other chronic pain: Secondary | ICD-10-CM | POA: Diagnosis not present

## 2019-12-06 DIAGNOSIS — R05 Cough: Secondary | ICD-10-CM | POA: Diagnosis not present

## 2019-12-06 MED ORDER — PROMETHAZINE-CODEINE 6.25-10 MG/5ML PO SYRP: 5.0000 mL | ORAL_SOLUTION | Freq: Four times a day (QID) | ORAL | 0 refills | Status: DC | PRN

## 2019-12-06 MED ORDER — HYDROCODONE-ACETAMINOPHEN 5-325 MG PO TABS: 1.0000 | ORAL_TABLET | Freq: Four times a day (QID) | ORAL | 0 refills | Status: DC | PRN

## 2019-12-06 NOTE — Progress Notes (Signed)
Virtual Visit via Telephone Note  I connected with the patient on 12/06/19 at  8:30 AM EST by telephone and verified that I am speaking with the correct person using two identifiers.   I discussed the limitations, risks, security and privacy concerns of performing an evaluation and management service by telephone and the availability of in person appointments. I also discussed with the patient that there may be a patient responsible charge related to this service. The patient expressed understanding and agreed to proceed.  Location patient: home Location provider: work or home office Participants present for the call: patient, provider Patient did not have a visit in the prior 7 days to address this/these issue(s).   History of Present Illness: Here for pain management. She is doing well. At our last visit we decreased the dose of hwer Norco, and she has tolerated this well.  Indication for chronic opioid: low back pain and chronic cough Medication and dose: Norco 5-325 and Promethazine-codeine 6.25-10/5 ml # pills per month: 120 and 240 ml Last UDS date: 03-13-18 Opioid Treatment Agreement signed (Y/N): 03-19-18 Opioid Treatment Agreement last reviewed with patient:  12-06-19 [facility] reviewed this encounter (include red flags): Yes    Observations/Objective: Patient sounds cheerful and well on the phone. I do not appreciate any SOB. Speech and thought processing are grossly intact. Patient reported vitals:  Assessment and Plan: Pain management, meds were refilled. Alysia Penna, MD   Follow Up Instructions:     [phone] 5-10 [id number] 11-20 [id number] 21-30 I did not refer this patient for an OV in the next 24 hours for this/these issue(s).  I discussed the assessment and treatment plan with the patient. The patient was provided an opportunity to ask questions and all were answered. The patient agreed with the plan and demonstrated an understanding of the instructions.   The patient was  advised to call back or seek an in-person evaluation if the symptoms worsen or if the condition fails to improve as anticipated.  I provided 12 minutes of non-face-to-face time during this encounter.   Alysia Penna, MD

## 2020-01-15 ENCOUNTER — Ambulatory Visit: Payer: Medicare Other

## 2020-01-18 DIAGNOSIS — Z6828 Body mass index (BMI) 28.0-28.9, adult: Secondary | ICD-10-CM | POA: Diagnosis not present

## 2020-01-18 DIAGNOSIS — E663 Overweight: Secondary | ICD-10-CM | POA: Diagnosis not present

## 2020-01-18 DIAGNOSIS — M2550 Pain in unspecified joint: Secondary | ICD-10-CM | POA: Diagnosis not present

## 2020-01-18 DIAGNOSIS — M1009 Idiopathic gout, multiple sites: Secondary | ICD-10-CM | POA: Diagnosis not present

## 2020-01-18 DIAGNOSIS — M064 Inflammatory polyarthropathy: Secondary | ICD-10-CM | POA: Diagnosis not present

## 2020-01-18 DIAGNOSIS — M150 Primary generalized (osteo)arthritis: Secondary | ICD-10-CM | POA: Diagnosis not present

## 2020-01-18 DIAGNOSIS — Z79899 Other long term (current) drug therapy: Secondary | ICD-10-CM | POA: Diagnosis not present

## 2020-01-23 ENCOUNTER — Ambulatory Visit: Payer: Medicare Other | Attending: Internal Medicine

## 2020-01-23 DIAGNOSIS — Z23 Encounter for immunization: Secondary | ICD-10-CM

## 2020-01-23 NOTE — Progress Notes (Signed)
   Covid-19 Vaccination Clinic  Name:  Deborah Bryant    MRN: [id number] DOB: [birth year]  01/23/2020  Deborah Bryant was observed post Covid-19 immunization for 15 minutes without incidence. She was provided with Vaccine Information Sheet and instruction to access the V-Safe system.   Deborah Bryant was instructed to call 911 with any severe reactions post vaccine: Marland Kitchen Difficulty breathing  . Swelling of your face and throat  . A fast heartbeat  . A bad rash all over your body  . Dizziness and weakness    Immunizations Administered    Name Date Dose VIS Date Route   Pfizer COVID-19 Vaccine 01/23/2020  8:23 AM 0.3 mL 11/26/2019 Intramuscular   Manufacturer: [place]   Lot: [id number]   [place]: [id number]

## 2020-01-26 ENCOUNTER — Ambulatory Visit: Payer: Medicare Other

## 2020-02-17 ENCOUNTER — Ambulatory Visit: Payer: Medicare Other | Attending: Internal Medicine

## 2020-02-17 DIAGNOSIS — Z23 Encounter for immunization: Secondary | ICD-10-CM | POA: Insufficient documentation

## 2020-02-17 NOTE — Progress Notes (Signed)
   Covid-19 Vaccination Clinic  Name:  Deborah Bryant    MRN: [id number] DOB: 1946/05/25  02/17/2020  [name] was observed post Covid-19 immunization for 15 minutes without incident. She was provided with Vaccine Information Sheet and instruction to access the V-Safe system.   [name] was instructed to call 911 with any severe reactions post vaccine: Marland Kitchen Difficulty breathing  . Swelling of face and throat  . A fast heartbeat  . A bad rash all over body  . Dizziness and weakness   Immunizations Administered    Name Date Dose VIS Date Route   Pfizer COVID-19 Vaccine 02/17/2020  9:35 AM 0.3 mL 11/26/2019 Intramuscular   Manufacturer: [place]   Lot: [id number]   [place]: [id number]

## 2020-03-06 ENCOUNTER — Other Ambulatory Visit: Payer: Self-pay

## 2020-03-06 ENCOUNTER — Telehealth (INDEPENDENT_AMBULATORY_CARE_PROVIDER_SITE_OTHER): Payer: Medicare Other | Admitting: Family Medicine

## 2020-03-06 DIAGNOSIS — R05 Cough: Secondary | ICD-10-CM | POA: Diagnosis not present

## 2020-03-06 DIAGNOSIS — M545 Low back pain: Secondary | ICD-10-CM | POA: Diagnosis not present

## 2020-03-06 DIAGNOSIS — G8929 Other chronic pain: Secondary | ICD-10-CM | POA: Diagnosis not present

## 2020-03-06 DIAGNOSIS — F1190 Opioid use, unspecified, uncomplicated: Secondary | ICD-10-CM | POA: Diagnosis not present

## 2020-03-06 DIAGNOSIS — R053 Chronic cough: Secondary | ICD-10-CM

## 2020-03-06 MED ORDER — HYDROCODONE-ACETAMINOPHEN 5-325 MG PO TABS: 1.0000 | ORAL_TABLET | Freq: Four times a day (QID) | ORAL | 0 refills | Status: AC | PRN

## 2020-03-06 MED ORDER — PROMETHAZINE-CODEINE 6.25-10 MG/5ML PO SYRP: 5.0000 mL | ORAL_SOLUTION | Freq: Four times a day (QID) | ORAL | 0 refills | Status: DC | PRN

## 2020-03-06 MED ORDER — HYDROCODONE-ACETAMINOPHEN 5-325 MG PO TABS: 1.0000 | ORAL_TABLET | Freq: Four times a day (QID) | ORAL | 0 refills | Status: DC | PRN

## 2020-03-06 NOTE — Progress Notes (Signed)
Virtual Visit via Telephone Note  I connected with the patient on 03/06/20 at 10:45 AM EDT by telephone and verified that I am speaking with the correct person using two identifiers.   I discussed the limitations, risks, security and privacy concerns of performing an evaluation and management service by telephone and the availability of in person appointments. I also discussed with the patient that there may be a patient responsible charge related to this service. The patient expressed understanding and agreed to proceed.  Location patient: home Location provider: work or home office Participants present for the call: patient, provider Patient did not have a visit in the prior 7 days to address this/these issue(s).   History of Present Illness: Here for pain management, she is doing well.  Indication for chronic opioid: low back pain and chronic cough Medication and dose: Norco 5-325 and Promethazine-codeine 6.25-10/5 ml # pills per month: 120 and 240 ml Last UDS date: 03-13-18 Opioid Treatment Agreement signed (Y/N): 03-19-18 Opioid Treatment Agreement last reviewed with patient:  03-06-20 [place] reviewed this encounter (include red flags): Yes    Observations/Objective: Patient sounds cheerful and well on the phone. I do not appreciate any SOB. Speech and thought processing are grossly intact. Patient reported vitals:  Assessment and Plan: Pain management, meds were refilled. Alysia Penna, MD   Follow Up Instructions:     [phone] 5-10 [phone] 11-20 9443 21-30 I did not refer this patient for an OV in the next 24 hours for this/these issue(s).  I discussed the assessment and treatment plan with the patient. The patient was provided an opportunity to ask questions and all were answered. The patient agreed with the plan and demonstrated an understanding of the instructions.   The patient was advised to call back or seek an in-person evaluation if the symptoms worsen or if the  condition fails to improve as anticipated.  I provided 14 minutes of non-face-to-face time during this encounter.   Alysia Penna, MD

## 2020-04-18 ENCOUNTER — Encounter: Payer: Self-pay | Admitting: Family Medicine

## 2020-04-18 NOTE — Telephone Encounter (Signed)
She later called to say the pharmacy did find the rx

## 2020-05-19 ENCOUNTER — Other Ambulatory Visit: Payer: Self-pay | Admitting: Family Medicine

## 2020-05-19 NOTE — Telephone Encounter (Signed)
Should the patient continue this?

## 2020-06-06 ENCOUNTER — Telehealth (INDEPENDENT_AMBULATORY_CARE_PROVIDER_SITE_OTHER): Payer: Medicare Other | Admitting: Family Medicine

## 2020-06-06 ENCOUNTER — Other Ambulatory Visit: Payer: Self-pay

## 2020-06-06 ENCOUNTER — Encounter: Payer: Self-pay | Admitting: Family Medicine

## 2020-06-06 DIAGNOSIS — M545 Low back pain: Secondary | ICD-10-CM | POA: Diagnosis not present

## 2020-06-06 DIAGNOSIS — F1190 Opioid use, unspecified, uncomplicated: Secondary | ICD-10-CM | POA: Diagnosis not present

## 2020-06-06 DIAGNOSIS — G8929 Other chronic pain: Secondary | ICD-10-CM

## 2020-06-06 DIAGNOSIS — R053 Chronic cough: Secondary | ICD-10-CM

## 2020-06-06 DIAGNOSIS — R05 Cough: Secondary | ICD-10-CM

## 2020-06-06 MED ORDER — PROMETHAZINE-CODEINE 6.25-10 MG/5ML PO SOLN: ORAL | 2 refills | Status: DC

## 2020-06-06 MED ORDER — HYDROCODONE-ACETAMINOPHEN 5-325 MG PO TABS: 1.0000 | ORAL_TABLET | Freq: Four times a day (QID) | ORAL | 0 refills | Status: DC | PRN

## 2020-06-06 NOTE — Progress Notes (Signed)
   Subjective:    Patient ID: Deborah Bryant, female    DOB: 1946-11-24, 74 y.o.   MRN: [id number]  HPI Virtual Visit via Telephone Note  I connected with the patient on 06/06/20 at 10:30 AM EDT by telephone and verified that I am speaking with the correct person using two identifiers.   I discussed the limitations, risks, security and privacy concerns of performing an evaluation and management service by telephone and the availability of in person appointments. I also discussed with the patient that there may be a patient responsible charge related to this service. The patient expressed understanding and agreed to proceed.  Location patient: home Location provider: work or home office Participants present for the call: patient, provider Patient did not have a visit in the prior 7 days to address this/these issue(s).   History of Present Illness: Here for pain management. She is doing well.  Indication for chronic opioid: low back pain and chronic cough Medication and dose: Norco 5-325 and Promethazine-codeine 6.25/5 ml # pills per month: 120 and 240 ml Last UDS date: 03-13-18 Opioid Treatment Agreement signed (Y/N): 03-19-18 Opioid Treatment Agreement last reviewed with patient:  06-06-20 [place] reviewed this encounter (include red flags): Yes    Observations/Objective: Patient sounds cheerful and well on the phone. I do not appreciate any SOB. Speech and thought processing are grossly intact. Patient reported vitals:  Assessment and Plan: Pain management, meds were refilled.  Alysia Penna, MD   Follow Up Instructions:     [phone] 5-10 [phone] 11-20 9443 21-30 I did not refer this patient for an OV in the next 24 hours for this/these issue(s).  I discussed the assessment and treatment plan with the patient. The patient was provided an opportunity to ask questions and all were answered. The patient agreed with the plan and demonstrated an understanding of the instructions.     The patient was advised to call back or seek an in-person evaluation if the symptoms worsen or if the condition fails to improve as anticipated.  I provided 14 minutes of non-face-to-face time during this encounter.   Alysia Penna, MD    Review of Systems     Objective:   Physical Exam        Assessment & Plan:

## 2020-06-20 ENCOUNTER — Telehealth (INDEPENDENT_AMBULATORY_CARE_PROVIDER_SITE_OTHER): Payer: Medicare Other | Admitting: Family Medicine

## 2020-06-20 ENCOUNTER — Encounter: Payer: Self-pay | Admitting: Family Medicine

## 2020-06-20 VITALS — Temp 97.3°F

## 2020-06-20 DIAGNOSIS — J029 Acute pharyngitis, unspecified: Secondary | ICD-10-CM

## 2020-06-20 NOTE — Progress Notes (Signed)
Virtual Visit via Telephone Note  I connected with Deborah Bryant on 06/20/20 at  3:00 PM EDT by telephone and verified that I am speaking with the correct person using two identifiers.   I discussed the limitations, risks, security and privacy concerns of performing an evaluation and management service by telephone and the availability of in person appointments. I also discussed with the patient that there may be a patient responsible charge related to this service. The patient expressed understanding and agreed to proceed.  Location patient: home, [place] Location provider: work or home office Participants present for the call: patient, provider Patient did not have a visit in the prior 7 days to address this/these issue(s).   History of Present Illness:  Acute visit for a sore throat: -started yesterday -symptoms: throat hurt with swallowing on R side - now doing much better, has some baseline nasal congestion/PND - did not seem out of the ordinary -denies: fever, cough, SOB, NVD, fatigue out of the ordinary -has chronic loss of taste -reports is pretty isolated and denies sick contacts or exposures to groups of people out of her house -fully vaccinated for covid19   Observations/Objective: Patient sounds cheerful and well on the phone. I do not appreciate any SOB. Speech and thought processing are grossly intact. Patient reported vitals:  Assessment and Plan:  Sore throat  -we discussed possible serious and likely etiologies, options for evaluation and workup, limitations of telemedicine visit vs in person visit, treatment, treatment risks and precautions. Pt prefers to treat via telemedicine empirically rather then risking or undertaking an in person visit at this moment. Since feeling better she opted for observation, salt water gargles, OTC options discussed.. Patient agrees to seek prompt in person care if worsening, new symptoms arise, or if is not improving with  treatment.  Follow Up Instructions:   I did not refer this patient for an OV in the next 24 hours for this/these issue(s).  I discussed the assessment and treatment plan with the patient. The patient was provided an opportunity to ask questions and all were answered. The patient agreed with the plan and demonstrated an understanding of the instructions.   The patient was advised to call back or seek an in-person evaluation if the symptoms worsen or if the condition fails to improve as anticipated.  I provided 18 minutes of non-face-to-face time during this encounter.   Lucretia Kern, DO

## 2020-06-21 ENCOUNTER — Encounter (HOSPITAL_BASED_OUTPATIENT_CLINIC_OR_DEPARTMENT_OTHER): Payer: Self-pay | Admitting: Emergency Medicine

## 2020-06-21 ENCOUNTER — Emergency Department (HOSPITAL_BASED_OUTPATIENT_CLINIC_OR_DEPARTMENT_OTHER)
Admission: EM | Admit: 2020-06-21 | Discharge: 2020-06-21 | Disposition: A | Discharge status: Home / Self Care | Payer: Medicare Other | Attending: Emergency Medicine | Admitting: Emergency Medicine

## 2020-06-21 ENCOUNTER — Other Ambulatory Visit: Payer: Self-pay

## 2020-06-21 DIAGNOSIS — Z79899 Other long term (current) drug therapy: Secondary | ICD-10-CM | POA: Insufficient documentation

## 2020-06-21 DIAGNOSIS — E1122 Type 2 diabetes mellitus with diabetic chronic kidney disease: Secondary | ICD-10-CM | POA: Insufficient documentation

## 2020-06-21 DIAGNOSIS — I129 Hypertensive chronic kidney disease with stage 1 through stage 4 chronic kidney disease, or unspecified chronic kidney disease: Secondary | ICD-10-CM | POA: Insufficient documentation

## 2020-06-21 DIAGNOSIS — F17210 Nicotine dependence, cigarettes, uncomplicated: Secondary | ICD-10-CM | POA: Diagnosis not present

## 2020-06-21 DIAGNOSIS — N182 Chronic kidney disease, stage 2 (mild): Secondary | ICD-10-CM | POA: Insufficient documentation

## 2020-06-21 DIAGNOSIS — Z7982 Long term (current) use of aspirin: Secondary | ICD-10-CM | POA: Insufficient documentation

## 2020-06-21 DIAGNOSIS — Z7984 Long term (current) use of oral hypoglycemic drugs: Secondary | ICD-10-CM | POA: Diagnosis not present

## 2020-06-21 DIAGNOSIS — R070 Pain in throat: Secondary | ICD-10-CM | POA: Insufficient documentation

## 2020-06-21 DIAGNOSIS — J449 Chronic obstructive pulmonary disease, unspecified: Secondary | ICD-10-CM | POA: Insufficient documentation

## 2020-06-21 DIAGNOSIS — J029 Acute pharyngitis, unspecified: Secondary | ICD-10-CM

## 2020-06-21 LAB — CBG MONITORING, ED: Glucose-Capillary: 98 mg/dL (ref 70–99)

## 2020-06-21 LAB — GROUP A STREP BY PCR: Group A Strep by PCR: NOT DETECTED

## 2020-06-21 NOTE — Discharge Instructions (Addendum)
Get help right away if: °You have difficulty breathing. °You cannot swallow fluids, soft foods, or your saliva. °You have increased swelling in your throat or neck. °You have persistent nausea and vomiting. °

## 2020-06-21 NOTE — ED Provider Notes (Signed)
[facility] EMERGENCY DEPARTMENT Provider Note   CSN: [id number] Arrival date & time: 06/21/20  1322     History Chief Complaint  Patient presents with  . Sore Throat    Deborah Bryant is a 74 y.o. female female with a past medical history of diabetes, chronic tobacco abuse, reflux who presents emergency department chief complaint of sore throat x2 days.  Pain is worse on the right side.  She has pain with swallowing.  She denies changes in voice, fever, chills.  She is able to swallow.  She has no fever.  She has chronic allergies.  She denies nasal congestion.  HPI     Past Medical History:  Diagnosis Date  . Allergic rhinitis   . Anxiety   . Arthritis    sees Dr. Gavin Pound   . Cellulitis, periorbital 2006   left eye, due to MRSA, saw Dr. Erik Obey   . Chronic kidney disease   . Complication of anesthesia   . COPD (chronic obstructive pulmonary disease) ([place])   . Cough   . Diabetes mellitus   . Dyspnea    with exertion  . GERD (gastroesophageal reflux disease)    OTC  Pepcid  . Hemorrhoids   . Hyperlipidemia   . Hypertension   . IBS (irritable bowel syndrome)   . Loss of smell   . Loss of taste   . Memory changes   . Menopause   . 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 Pneumonia   . PONV (postoperative nausea and vomiting)   . Tubular adenoma of colon 12/2006    Patient Active Problem List   Diagnosis Date Noted  . Confusion 11/01/2019  . 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 Ageusia 11/01/2019  . Loss of smell 02/02/2019  . Loss of taste 02/02/2019  . Degenerative spondylolisthesis 09/29/2018  . Chronic cough 09/09/2018  . Low back pain 06/09/2018  . Shoulder pain, bilateral 03/13/2018  . CKD (chronic kidney disease) stage 2, GFR 60-89 ml/min 07/03/2017  . IBS (irritable bowel syndrome) 07/03/2017  . Vitamin D deficiency 08/01/2015  . Mixed simple and mucopurulent chronic bronchitis ([place]) 06/11/2015  . 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 Nuclear cataract, bilateral 06/16/2014  . Smoker 09/07/2010  . Hypothyroidism 03/29/2008  . ANEMIA 03/29/2008  . Arthropathy 01/07/2008  . Gout 10/07/2007  . Diabetes mellitus without complication ([place]) 34/74/2595  . Hyperlipemia 07/21/2007  . Essential hypertension 07/21/2007    Past Surgical History:  Procedure Laterality Date  . APPENDECTOMY    . BACK SURGERY    . COLONOSCOPY  04-01-12   per Dr. Fuller Plan, tubular adenoma, repeat in 5 yrs   . POLYPECTOMY    . TUBAL LIGATION       OB History   No obstetric history on file.     Family History  Problem Relation Age of Onset  . Alcohol abuse Mother 28       MVA  . Diabetes Mother   . Heart disease Father 62       MI  . Heart disease Brother   . Diabetes Brother   . Arthritis Sister   . Dementia Maternal Grandfather     Social History   Tobacco Use  . Smoking status: Current Every Day Smoker    Packs/day: 1.00    Years: 1.00    Pack years: 1.00    Types: Cigarettes  . Smokeless tobacco: Never Used  Substance Use Topics  .  Alcohol use: Not Currently    Alcohol/week: 0.0 standard drinks  . Drug use: Never    Home Medications Prior to Admission medications   Medication Sig Start Date End Date Taking? Authorizing Provider  allopurinol (ZYLOPRIM) 300 MG tablet Take 1 tablet (300 mg total) by mouth daily. 08/24/19   Laurey Morale, MD  Ascorbic Acid (VITAMIN C) 1000 MG tablet Take 1,000 mg by mouth daily.    [provider]  aspirin EC 81 MG tablet Take 81 mg by mouth daily.    [provider]  atorvastatin (LIPITOR) 40 MG tablet Take 1 tablet (40 mg total) by mouth daily. 08/24/19 08/23/20  Laurey Morale, MD  Cholecalciferol (VITAMIN D) 2000 units CAPS Take 2,000 Units by mouth daily.     [provider]  diazepam (VALIUM) 5 MG tablet Take 1-2 tablets (5-10 mg total) by mouth every 8 (eight) hours as needed for muscle spasms. 08/25/19   Laurey Morale, MD  diclofenac  (VOLTAREN) 75 MG EC tablet Take 1 tablet (75 mg total) by mouth daily. 08/24/19   Laurey Morale, MD  diltiazem (DILTIAZEM CD) 240 MG 24 hr capsule Take 1 capsule (240 mg total) by mouth daily. 08/24/19   Laurey Morale, MD  diltiazem (TIAZAC) 240 MG 24 hr capsule Take 240 mg by mouth daily. 02/17/20   [provider]  famotidine (PEPCID) 10 MG tablet Take 10 mg by mouth 2 (two) times daily.    [provider]  glipiZIDE (GLUCOTROL XL) 5 MG 24 hr tablet Take 1 tablet (5 mg total) by mouth daily with breakfast. 08/24/19   Laurey Morale, MD  hydrochlorothiazide (HYDRODIURIL) 25 MG tablet Take 1 tablet (25 mg total) by mouth daily. 08/24/19   Laurey Morale, MD  HYDROcodone-acetaminophen (NORCO) 5-325 MG tablet Take 1 tablet by mouth every 6 (six) hours as needed for moderate pain. 08/19/20 09/18/20  Laurey Morale, MD  hydroxychloroquine (PLAQUENIL) 200 MG tablet Take 200 mg by mouth daily.     [provider]  polyethylene glycol powder (GLYCOLAX/MIRALAX) powder Take 8.5-17 g by mouth at bedtime.     [provider]  saccharomyces boulardii (FLORASTOR) 250 MG capsule Take 250 mg by mouth daily.    [provider]    Allergies    Losartan, Benazepril, Lasix [furosemide], Levaquin [levofloxacin], Phenobarbital, and Tape  Review of Systems   Review of Systems Ten systems reviewed and are negative for acute change, except as noted in the HPI.   Physical Exam Updated Vital Signs BP (!) 143/59 (BP Location: Right Arm)   Pulse 66   Temp 99 F (37.2 C) (Oral)   Resp 16   Ht 5\' 2"  (1.575 m)   Wt 74.5 kg   SpO2 93%   BMI 30.03 kg/m   Physical Exam Physical Exam  Nursing note and vitals reviewed. Constitutional: She is oriented to person, place, and time. She appears well-developed and well-nourished. No distress.  HENT:  Head: Normocephalic and atraumatic.  Eyes: Conjunctivae normal and EOM are normal. Pupils are equal, round, and reactive to light. No  scleral icterus.  Mouth: Tongue is coated, geographic.  She has mild posterior erythematous changes of the pharynx and right tonsil, no uvular deviation, no tonsillar swelling or exudate.  Right tonsillar adenopathy is tender to palpation Neck: Normal range of motion.  Cardiovascular: Normal rate, regular rhythm and normal heart sounds.  Exam reveals no gallop and no friction rub.   No  murmur heard. Pulmonary/Chest: Effort normal and breath sounds normal. No respiratory distress.  Abdominal: Soft. Bowel sounds are normal. She exhibits no distension and no mass. There is no tenderness. There is no guarding.  Neurological: She is alert and oriented to person, place, and time.  Skin: Skin is warm and dry. She is not diaphoretic.    ED Results / Procedures / Treatments   Labs (all labs ordered are listed, but only abnormal results are displayed) Labs Reviewed  GROUP A STREP BY PCR  CBG MONITORING, ED    EKG None  Radiology No results found.  Procedures Procedures (including critical care time)  Medications Ordered in ED Medications - No data to display  ED Course  I have reviewed the triage vital signs and the nursing notes.  Pertinent labs & imaging results that were available during my care of the patient were reviewed by me and considered in my medical decision making (see chart for details).    MDM Rules/Calculators/A&P                          Patient is a daily smoker, history of diabetes, differential includes strep throat, peritonsillar abscess, candidal esophagitis, mouth and throat cancer.  She has a negative strep test, no obvious lesions or masses on my examination, she is tolerating her own saliva, uvula is midline, no evidence of peritonsillar abscess.  Discharged with symptomatic care and outpatient follow-up.  Discussed return precautions. Final Clinical Impression(s) / ED Diagnoses Final diagnoses:  None    Rx / DC Orders ED Discharge Orders    None        Margarita Mail, PA-C 06/21/20 1552    Tegeler, Gwenyth Allegra, MD 06/21/20 [phone]

## 2020-06-21 NOTE — ED Triage Notes (Signed)
Sore throat x2 days.  Denies any other sx. No know exposures.

## 2020-07-18 DIAGNOSIS — E663 Overweight: Secondary | ICD-10-CM | POA: Diagnosis not present

## 2020-07-18 DIAGNOSIS — M150 Primary generalized (osteo)arthritis: Secondary | ICD-10-CM | POA: Diagnosis not present

## 2020-07-18 DIAGNOSIS — M2550 Pain in unspecified joint: Secondary | ICD-10-CM | POA: Diagnosis not present

## 2020-07-18 DIAGNOSIS — M064 Inflammatory polyarthropathy: Secondary | ICD-10-CM | POA: Diagnosis not present

## 2020-07-18 DIAGNOSIS — Z79899 Other long term (current) drug therapy: Secondary | ICD-10-CM | POA: Diagnosis not present

## 2020-07-18 DIAGNOSIS — Z6828 Body mass index (BMI) 28.0-28.9, adult: Secondary | ICD-10-CM | POA: Diagnosis not present

## 2020-07-18 DIAGNOSIS — M1009 Idiopathic gout, multiple sites: Secondary | ICD-10-CM | POA: Diagnosis not present

## 2020-08-18 ENCOUNTER — Ambulatory Visit: Payer: Medicare Other

## 2020-08-22 ENCOUNTER — Other Ambulatory Visit: Payer: Self-pay | Admitting: Family Medicine

## 2020-08-24 ENCOUNTER — Other Ambulatory Visit: Payer: Self-pay

## 2020-08-25 ENCOUNTER — Encounter: Payer: Self-pay | Admitting: Family Medicine

## 2020-08-25 ENCOUNTER — Ambulatory Visit (INDEPENDENT_AMBULATORY_CARE_PROVIDER_SITE_OTHER): Payer: Medicare Other | Admitting: Family Medicine

## 2020-08-25 VITALS — BP 142/70 | HR 68 | Temp 98.2°F | Ht 62.0 in | Wt 163.0 lb

## 2020-08-25 DIAGNOSIS — E782 Mixed hyperlipidemia: Secondary | ICD-10-CM

## 2020-08-25 DIAGNOSIS — Z23 Encounter for immunization: Secondary | ICD-10-CM | POA: Diagnosis not present

## 2020-08-25 DIAGNOSIS — E119 Type 2 diabetes mellitus without complications: Secondary | ICD-10-CM

## 2020-08-25 DIAGNOSIS — N182 Chronic kidney disease, stage 2 (mild): Secondary | ICD-10-CM | POA: Diagnosis not present

## 2020-08-25 DIAGNOSIS — I10 Essential (primary) hypertension: Secondary | ICD-10-CM

## 2020-08-25 DIAGNOSIS — E039 Hypothyroidism, unspecified: Secondary | ICD-10-CM | POA: Diagnosis not present

## 2020-08-25 DIAGNOSIS — F1190 Opioid use, unspecified, uncomplicated: Secondary | ICD-10-CM

## 2020-08-25 DIAGNOSIS — E559 Vitamin D deficiency, unspecified: Secondary | ICD-10-CM

## 2020-08-25 MED ORDER — DIAZEPAM 5 MG PO TABS: 5.0000 mg | ORAL_TABLET | Freq: Three times a day (TID) | ORAL | 5 refills | Status: DC | PRN

## 2020-08-25 MED ORDER — ALLOPURINOL 300 MG PO TABS
300.0000 mg | ORAL_TABLET | Freq: Every day | ORAL | 3 refills | Status: DC
Start: 2020-08-25 — End: 2021-08-27

## 2020-08-25 MED ORDER — DICLOFENAC SODIUM 75 MG PO TBEC
75.0000 mg | DELAYED_RELEASE_TABLET | Freq: Every day | ORAL | 3 refills | Status: DC
Start: 2020-08-25 — End: 2021-08-27

## 2020-08-25 NOTE — Progress Notes (Signed)
   Subjective:    Patient ID: Deborah Bryant, female    DOB: February 08, 1946, 74 y.o.   MRN: [id number]  HPI Here to follow up on issues. She feels well in general. Her BP is stable. She needs a refill on Diazepam, her anxiety has been acting up due to all her husband's health issues. She has gained some weight so she wants to see how her diabetes is doing.   Review of Systems  Constitutional: Negative.   HENT: Negative.   Eyes: Negative.   Respiratory: Negative.   Cardiovascular: Negative.   Gastrointestinal: Negative.   Genitourinary: Negative for decreased urine volume, difficulty urinating, dyspareunia, dysuria, enuresis, flank pain, frequency, hematuria, pelvic pain and urgency.  Musculoskeletal: Negative.   Skin: Negative.   Neurological: Negative.   Psychiatric/Behavioral: The patient is nervous/anxious.        Objective:   Physical Exam Constitutional:      General: She is not in acute distress.    Appearance: She is well-developed.  HENT:     Head: Normocephalic and atraumatic.     Right Ear: External ear normal.     Left Ear: External ear normal.     Nose: Nose normal.     Mouth/Throat:     Pharynx: No oropharyngeal exudate.  Eyes:     General: No scleral icterus.    Conjunctiva/sclera: Conjunctivae normal.     Pupils: Pupils are equal, round, and reactive to light.  Neck:     Thyroid: No thyromegaly.     Vascular: No JVD.  Cardiovascular:     Rate and Rhythm: Normal rate and regular rhythm.     Heart sounds: Normal heart sounds. No murmur heard.  No friction rub. No gallop.   Pulmonary:     Effort: Pulmonary effort is normal. No respiratory distress.     Breath sounds: Normal breath sounds. No wheezing or rales.  Chest:     Chest wall: No tenderness.  Abdominal:     General: Bowel sounds are normal. There is no distension.     Palpations: Abdomen is soft. There is no mass.     Tenderness: There is no abdominal tenderness. There is no guarding or rebound.    Musculoskeletal:        General: No tenderness. Normal range of motion.     Cervical back: Normal range of motion and neck supple.  Lymphadenopathy:     Cervical: No cervical adenopathy.  Skin:    General: Skin is warm and dry.     Findings: No erythema or rash.  Neurological:     Mental Status: She is alert and oriented to person, place, and time.     Cranial Nerves: No cranial nerve deficit.     Motor: No abnormal muscle tone.     Coordination: Coordination normal.     Deep Tendon Reflexes: Reflexes are normal and symmetric. Reflexes normal.  Psychiatric:        Behavior: Behavior normal.        Thought Content: Thought content normal.        Judgment: Judgment normal.           Assessment & Plan:  Her HTN is stable. We will refill the Diazepam. Get fasting labs to check A1c, lipids, vitamin D, etc.  Alysia Penna, MD

## 2020-08-28 LAB — DRUG MONITOR, PANEL 1, W/CONF, URINE
Alphahydroxyalprazolam: NEGATIVE ng/mL (ref <25)
Alphahydroxymidazolam: NEGATIVE ng/mL (ref <50)
Alphahydroxytriazolam: NEGATIVE ng/mL (ref <50)
Aminoclonazepam: NEGATIVE ng/mL (ref <25)
Amphetamines: NEGATIVE ng/mL (ref <500)
Barbiturates: NEGATIVE ng/mL (ref <300)
Benzodiazepines: POSITIVE ng/mL — AB (ref <100)
Cocaine Metabolite: NEGATIVE ng/mL (ref <150)
Codeine: >10000 ng/mL — ABNORMAL HIGH (ref <50)
Creatinine: 127.1 mg/dL
Hydrocodone: 922 ng/mL — ABNORMAL HIGH (ref <50)
Hydromorphone: 781 ng/mL — ABNORMAL HIGH (ref <50)
Hydroxyethylflurazepam: NEGATIVE ng/mL (ref <50)
Lorazepam: NEGATIVE ng/mL (ref <50)
Marijuana Metabolite: NEGATIVE ng/mL (ref <20)
Methadone Metabolite: NEGATIVE ng/mL (ref <100)
Morphine: 1564 ng/mL — ABNORMAL HIGH (ref <50)
Nordiazepam: 708 ng/mL — ABNORMAL HIGH (ref <50)
Norhydrocodone: 1654 ng/mL — ABNORMAL HIGH (ref <50)
Opiates: POSITIVE ng/mL — AB (ref <100)
Oxazepam: 1456 ng/mL — ABNORMAL HIGH (ref <50)
Oxidant: NEGATIVE ug/mL
Oxycodone: NEGATIVE ng/mL (ref <100)
Phencyclidine: NEGATIVE ng/mL (ref <25)
Temazepam: 794 ng/mL — ABNORMAL HIGH (ref <50)
pH: 7.1 (ref 4.5–9.0)

## 2020-08-28 LAB — BASIC METABOLIC PANEL
BUN: 14 mg/dL (ref 7–25)
CO2: 28 mmol/L (ref 20–32)
Calcium: 9.7 mg/dL (ref 8.6–10.4)
Chloride: 102 mmol/L (ref 98–110)
Creat: 0.87 mg/dL (ref 0.60–0.93)
Glucose, Bld: 108 mg/dL — ABNORMAL HIGH (ref 65–99)
Potassium: 4.1 mmol/L (ref 3.5–5.3)
Sodium: 141 mmol/L (ref 135–146)

## 2020-08-28 LAB — URINALYSIS
Bilirubin Urine: NEGATIVE
Glucose, UA: NEGATIVE
Hgb urine dipstick: NEGATIVE
Ketones, ur: NEGATIVE
Nitrite: NEGATIVE
Specific Gravity, Urine: 1.016 (ref 1.001–1.03)
pH: 6.5 (ref 5.0–8.0)

## 2020-08-28 LAB — LIPID PANEL
Cholesterol: 135 mg/dL (ref <200)
HDL: 76 mg/dL (ref >=50)
LDL Cholesterol (Calc): 41 mg/dL (calc)
Non-HDL Cholesterol (Calc): 59 mg/dL (calc) (ref <130)
Total CHOL/HDL Ratio: 1.8 (calc) (ref <5.0)
Triglycerides: 95 mg/dL (ref <150)

## 2020-08-28 LAB — CBC WITH DIFFERENTIAL/PLATELET
Absolute Monocytes: 764 cells/uL (ref 200–950)
Basophils Absolute: 137 cells/uL (ref 0–200)
Basophils Relative: 1.2 %
Eosinophils Absolute: 125 cells/uL (ref 15–500)
Eosinophils Relative: 1.1 %
HCT: 45.9 % — ABNORMAL HIGH (ref 35.0–45.0)
Hemoglobin: 15.5 g/dL (ref 11.7–15.5)
Lymphs Abs: 1208 cells/uL (ref 850–3900)
MCH: 30.9 pg (ref 27.0–33.0)
MCHC: 33.8 g/dL (ref 32.0–36.0)
MCV: 91.4 fL (ref 80.0–100.0)
MPV: 10.5 fL (ref 7.5–12.5)
Monocytes Relative: 6.7 %
Neutro Abs: 9166 cells/uL — ABNORMAL HIGH (ref 1500–7800)
Neutrophils Relative %: 80.4 %
Platelets: 334 10*3/uL (ref 140–400)
RBC: 5.02 10*6/uL (ref 3.80–5.10)
RDW: 13.1 % (ref 11.0–15.0)
Total Lymphocyte: 10.6 %
WBC: 11.4 10*3/uL — ABNORMAL HIGH (ref 3.8–10.8)

## 2020-08-28 LAB — DM TEMPLATE

## 2020-08-28 LAB — HEPATIC FUNCTION PANEL
AG Ratio: 1.8 (calc) (ref 1.0–2.5)
ALT: 16 U/L (ref 6–29)
AST: 18 U/L (ref 10–35)
Albumin: 4.6 g/dL (ref 3.6–5.1)
Alkaline phosphatase (APISO): 139 U/L (ref 37–153)
Bilirubin, Direct: 0.1 mg/dL (ref 0.0–0.2)
Globulin: 2.5 g/dL (calc) (ref 1.9–3.7)
Indirect Bilirubin: 0.5 mg/dL (calc) (ref 0.2–1.2)
Total Bilirubin: 0.6 mg/dL (ref 0.2–1.2)
Total Protein: 7.1 g/dL (ref 6.1–8.1)

## 2020-08-28 LAB — HEMOGLOBIN A1C
Hgb A1c MFr Bld: 6 % of total Hgb — ABNORMAL HIGH (ref <5.7)
Mean Plasma Glucose: 126 (calc)
eAG (mmol/L): 7 (calc)

## 2020-08-28 LAB — TSH: TSH: 2.08 mIU/L (ref 0.40–4.50)

## 2020-08-28 LAB — VITAMIN D 25 HYDROXY (VIT D DEFICIENCY, FRACTURES): Vit D, 25-Hydroxy: 41 ng/mL (ref 30–100)

## 2020-08-30 ENCOUNTER — Encounter: Payer: Self-pay | Admitting: Family Medicine

## 2020-08-30 ENCOUNTER — Telehealth (INDEPENDENT_AMBULATORY_CARE_PROVIDER_SITE_OTHER): Payer: Medicare Other | Admitting: Family Medicine

## 2020-08-30 ENCOUNTER — Other Ambulatory Visit: Payer: Self-pay

## 2020-08-30 DIAGNOSIS — M545 Low back pain, unspecified: Secondary | ICD-10-CM

## 2020-08-30 DIAGNOSIS — R928 Other abnormal and inconclusive findings on diagnostic imaging of breast: Secondary | ICD-10-CM | POA: Diagnosis not present

## 2020-08-30 DIAGNOSIS — R05 Cough: Secondary | ICD-10-CM | POA: Diagnosis not present

## 2020-08-30 DIAGNOSIS — F1190 Opioid use, unspecified, uncomplicated: Secondary | ICD-10-CM

## 2020-08-30 DIAGNOSIS — G8929 Other chronic pain: Secondary | ICD-10-CM

## 2020-08-30 DIAGNOSIS — R053 Chronic cough: Secondary | ICD-10-CM

## 2020-08-30 MED ORDER — HYDROCODONE-ACETAMINOPHEN 5-325 MG PO TABS
1.0000 | ORAL_TABLET | Freq: Four times a day (QID) | ORAL | 0 refills | Status: DC | PRN
Start: 2020-11-18 — End: 2020-11-29

## 2020-08-30 MED ORDER — HYDROCODONE-ACETAMINOPHEN 5-325 MG PO TABS
1.0000 | ORAL_TABLET | Freq: Four times a day (QID) | ORAL | 0 refills | Status: DC | PRN
Start: 2020-10-19 — End: 2020-08-30

## 2020-08-30 MED ORDER — HYDROCODONE-ACETAMINOPHEN 5-325 MG PO TABS
1.0000 | ORAL_TABLET | Freq: Four times a day (QID) | ORAL | 0 refills | Status: DC | PRN
Start: 2020-09-18 — End: 2020-08-30

## 2020-08-30 MED ORDER — PROMETHAZINE-CODEINE 6.25-10 MG/5ML PO SYRP
5.0000 mL | ORAL_SOLUTION | ORAL | 5 refills | Status: AC | PRN
Start: 2020-09-18 — End: 2020-10-18

## 2020-08-30 NOTE — Progress Notes (Signed)
   Subjective:    Patient ID: Deborah Bryant, female    DOB: 1946-08-01, 74 y.o.   MRN: [id number]  HPI Here for pain management, she is doing well.  Indication for chronic opioid: low back pain and chronic cough Medication and dose: Norco 5-325 and Promethazine-codeine 6.25-10/5 ml  # pills per month: 120 and 240 ml Last UDS date: 08-25-20 Opioid Treatment Agreement signed (Y/N): 03-19-18 Opioid Treatment Agreement last reviewed with patient:  08-30-20 NCCSRS reviewed this encounter (include red flags): Yes Virtual Visit via Telephone Note  I connected with the patient on 08/30/20 at 11:15 AM EDT by telephone and verified that I am speaking with the correct person using two identifiers.   I discussed the limitations, risks, security and privacy concerns of performing an evaluation and management service by telephone and the availability of in person appointments. I also discussed with the patient that there may be a patient responsible charge related to this service. The patient expressed understanding and agreed to proceed.  Location patient: home Location provider: work or home office Participants present for the call: patient, provider Patient did not have a visit in the prior 7 days to address this/these issue(s).   History of Present Illness:    Observations/Objective: Patient sounds cheerful and well on the phone. I do not appreciate any SOB. Speech and thought processing are grossly intact. Patient reported vitals:  Assessment and Plan:   Follow Up Instructions:     [id number] 5-10 [id number] 48-18 5631 21-30 I did not refer this patient for an OV in the next 24 hours for this/these issue(s).  I discussed the assessment and treatment plan with the patient. The patient was provided an opportunity to ask questions and all were answered. The patient agreed with the plan and demonstrated an understanding of the instructions.   The patient was advised to call back or seek an  in-person evaluation if the symptoms worsen or if the condition fails to improve as anticipated.  I provided 16 minutes of non-face-to-face time during this encounter.   Alysia Penna, MD    Review of Systems     Objective:   Physical Exam        Assessment & Plan:  Pain management, meds were refilled.  Alysia Penna, MD

## 2020-08-31 ENCOUNTER — Telehealth: Payer: Medicare Other | Admitting: Family Medicine

## 2020-09-11 DIAGNOSIS — I129 Hypertensive chronic kidney disease with stage 1 through stage 4 chronic kidney disease, or unspecified chronic kidney disease: Secondary | ICD-10-CM | POA: Diagnosis not present

## 2020-09-11 DIAGNOSIS — R4182 Altered mental status, unspecified: Secondary | ICD-10-CM | POA: Diagnosis not present

## 2020-09-11 DIAGNOSIS — N182 Chronic kidney disease, stage 2 (mild): Secondary | ICD-10-CM | POA: Diagnosis not present

## 2020-09-11 DIAGNOSIS — R432 Parageusia: Secondary | ICD-10-CM | POA: Diagnosis not present

## 2020-09-11 DIAGNOSIS — Z6831 Body mass index (BMI) 31.0-31.9, adult: Secondary | ICD-10-CM | POA: Diagnosis not present

## 2020-09-11 DIAGNOSIS — Z720 Tobacco use: Secondary | ICD-10-CM | POA: Diagnosis not present

## 2020-09-20 DIAGNOSIS — Z23 Encounter for immunization: Secondary | ICD-10-CM | POA: Diagnosis not present

## 2020-10-24 ENCOUNTER — Ambulatory Visit: Payer: Medicare Other

## 2020-10-24 ENCOUNTER — Telehealth: Payer: Self-pay | Admitting: Family Medicine

## 2020-10-24 NOTE — Telephone Encounter (Signed)
Spoke with patient she did not want to r/s appt at this time due to upcoming referrals for appts

## 2020-11-16 ENCOUNTER — Telehealth: Payer: Self-pay | Admitting: Family Medicine

## 2020-11-16 NOTE — Telephone Encounter (Signed)
Left message for patient to call back and schedule Medicare Annual Wellness Visit (AWV) either virtually or in office.   Last AWV no information please schedule at anytime with [facility] Nurse Health Advisor 1 or 2   This should be a 45 minute visit. 

## 2020-11-22 ENCOUNTER — Other Ambulatory Visit: Payer: Self-pay | Admitting: Family Medicine

## 2020-11-23 NOTE — Telephone Encounter (Signed)
Last OV 08/30/20 Last refill 08/22/20 #90/0 Next OV 11/29/20

## 2020-11-28 DIAGNOSIS — E113293 Type 2 diabetes mellitus with mild nonproliferative diabetic retinopathy without macular edema, bilateral: Secondary | ICD-10-CM | POA: Diagnosis not present

## 2020-11-29 ENCOUNTER — Encounter: Payer: Self-pay | Admitting: Family Medicine

## 2020-11-29 ENCOUNTER — Telehealth (INDEPENDENT_AMBULATORY_CARE_PROVIDER_SITE_OTHER): Payer: Medicare Other | Admitting: Family Medicine

## 2020-11-29 ENCOUNTER — Other Ambulatory Visit: Payer: Self-pay

## 2020-11-29 DIAGNOSIS — M545 Low back pain, unspecified: Secondary | ICD-10-CM

## 2020-11-29 DIAGNOSIS — F1190 Opioid use, unspecified, uncomplicated: Secondary | ICD-10-CM

## 2020-11-29 DIAGNOSIS — G8929 Other chronic pain: Secondary | ICD-10-CM | POA: Diagnosis not present

## 2020-11-29 MED ORDER — HYDROCODONE-ACETAMINOPHEN 5-325 MG PO TABS
1.0000 | ORAL_TABLET | Freq: Four times a day (QID) | ORAL | 0 refills | Status: DC | PRN
Start: 2021-01-18 — End: 2020-11-29

## 2020-11-29 MED ORDER — HYDROCODONE-ACETAMINOPHEN 5-325 MG PO TABS
1.0000 | ORAL_TABLET | Freq: Four times a day (QID) | ORAL | 0 refills | Status: DC | PRN
Start: 2020-12-18 — End: 2020-11-29

## 2020-11-29 MED ORDER — HYDROCODONE-ACETAMINOPHEN 5-325 MG PO TABS
1.0000 | ORAL_TABLET | Freq: Four times a day (QID) | ORAL | 0 refills | Status: DC | PRN
Start: 2021-02-15 — End: 2021-02-27

## 2020-11-29 NOTE — Progress Notes (Signed)
   Subjective:    Patient ID: Deborah Bryant, female    DOB: December 13, 1946, 74 y.o.   MRN: [id number]  HPI Virtual Visit via Telephone Note  I connected with the patient on 11/29/20 at  2:00 PM EST by telephone and verified that I am speaking with the correct person using two identifiers.   I discussed the limitations, risks, security and privacy concerns of performing an evaluation and management service by telephone and the availability of in person appointments. I also discussed with the patient that there may be a patient responsible charge related to this service. The patient expressed understanding and agreed to proceed.  Location patient: home Location provider: work or home office Participants present for the call: patient, provider Patient did not have a visit in the prior 7 days to address this/these issue(s).   History of Present Illness: Here for pain management, she is doing well.  Indication for chronic opioid: low back pain Medication and dose: Norco 5-325 # pills per month: 120 Last UDS date: 08-25-20 Opioid Treatment Agreement signed (Y/N): 03-19-18 Opioid Treatment Agreement last reviewed with patient:  11-29-20 NCCSRS reviewed this encounter (include red flags): Yes    Observations/Objective: Patient sounds cheerful and well on the phone. I do not appreciate any SOB. Speech and thought processing are grossly intact. Patient reported vitals:  Assessment and Plan: Pain management, meds were refilled.  Alysia Penna, MD   Follow Up Instructions:     [phone] 5-10 [phone] 11-20 [phone] 21-30 I did not refer this patient for an OV in the next 24 hours for this/these issue(s).  I discussed the assessment and treatment plan with the patient. The patient was provided an opportunity to ask questions and all were answered. The patient agreed with the plan and demonstrated an understanding of the instructions.   The patient was advised to call back or seek an in-person  evaluation if the symptoms worsen or if the condition fails to improve as anticipated.  I provided 15 minutes of non-face-to-face time during this encounter.   Alysia Penna, MD    Review of Systems     Objective:   Physical Exam        Assessment & Plan:

## 2021-01-18 DIAGNOSIS — E663 Overweight: Secondary | ICD-10-CM | POA: Diagnosis not present

## 2021-01-18 DIAGNOSIS — Z79899 Other long term (current) drug therapy: Secondary | ICD-10-CM | POA: Diagnosis not present

## 2021-01-18 DIAGNOSIS — M2550 Pain in unspecified joint: Secondary | ICD-10-CM | POA: Diagnosis not present

## 2021-01-18 DIAGNOSIS — Z6827 Body mass index (BMI) 27.0-27.9, adult: Secondary | ICD-10-CM | POA: Diagnosis not present

## 2021-01-18 DIAGNOSIS — M1009 Idiopathic gout, multiple sites: Secondary | ICD-10-CM | POA: Diagnosis not present

## 2021-01-18 DIAGNOSIS — E118 Type 2 diabetes mellitus with unspecified complications: Secondary | ICD-10-CM | POA: Diagnosis not present

## 2021-01-18 DIAGNOSIS — M064 Inflammatory polyarthropathy: Secondary | ICD-10-CM | POA: Diagnosis not present

## 2021-01-18 DIAGNOSIS — M150 Primary generalized (osteo)arthritis: Secondary | ICD-10-CM | POA: Diagnosis not present

## 2021-01-18 DIAGNOSIS — H539 Unspecified visual disturbance: Secondary | ICD-10-CM | POA: Diagnosis not present

## 2021-02-02 DIAGNOSIS — Z79899 Other long term (current) drug therapy: Secondary | ICD-10-CM | POA: Diagnosis not present

## 2021-02-07 ENCOUNTER — Telehealth: Payer: Self-pay | Admitting: Family Medicine

## 2021-02-07 NOTE — Telephone Encounter (Signed)
Pt called to let us know at this present time she is not interested in making an appointment.

## 2021-02-07 NOTE — Telephone Encounter (Signed)
Left message for patient to call back and schedule Medicare Annual Wellness Visit (AWV) either virtually or in office. No detailed message left   AWVI  please schedule at anytime with [facility] Nurse Health Advisor 1 or 2   This should be a 45 minute visit. 

## 2021-02-16 ENCOUNTER — Other Ambulatory Visit: Payer: Self-pay | Admitting: Family Medicine

## 2021-02-27 ENCOUNTER — Encounter: Payer: Self-pay | Admitting: Family Medicine

## 2021-02-27 ENCOUNTER — Telehealth (INDEPENDENT_AMBULATORY_CARE_PROVIDER_SITE_OTHER): Payer: Medicare Other | Admitting: Family Medicine

## 2021-02-27 DIAGNOSIS — G8929 Other chronic pain: Secondary | ICD-10-CM

## 2021-02-27 DIAGNOSIS — M545 Low back pain, unspecified: Secondary | ICD-10-CM | POA: Diagnosis not present

## 2021-02-27 DIAGNOSIS — F1190 Opioid use, unspecified, uncomplicated: Secondary | ICD-10-CM

## 2021-02-27 MED ORDER — HYDROCODONE-ACETAMINOPHEN 5-325 MG PO TABS: 1.0000 | ORAL_TABLET | Freq: Four times a day (QID) | ORAL | 0 refills | Status: DC | PRN

## 2021-02-27 MED ORDER — DIAZEPAM 5 MG PO TABS: 5.0000 mg | ORAL_TABLET | Freq: Three times a day (TID) | ORAL | 5 refills | Status: DC | PRN

## 2021-02-27 MED ORDER — HYDROCODONE-ACETAMINOPHEN 5-325 MG PO TABS
1.0000 | ORAL_TABLET | Freq: Four times a day (QID) | ORAL | 0 refills | Status: DC | PRN
Start: 2021-03-17 — End: 2021-02-27

## 2021-02-27 NOTE — Progress Notes (Signed)
   Subjective:    Patient ID: Deborah Bryant, female    DOB: [birth year], 75 y.o.   MRN: [id number]  HPI Here for pain management, she is doing well.  Indication for chronic opioid: low back pain Medication and dose: Norco 5-325 # pills per month: 120 Last UDS date: 08-25-20 Opioid Treatment Agreement signed (Y/N): 03-19-18 Opioid Treatment Agreement last reviewed with patient:  02-27-21 NCCSRS reviewed this encounter (include red flags): Yes Virtual Visit via Telephone Note  I connected with the patient on 02/27/21 at 10:30 AM EDT by telephone and verified that I am speaking with the correct person using two identifiers.   I discussed the limitations, risks, security and privacy concerns of performing an evaluation and management service by telephone and the availability of in person appointments. I also discussed with the patient that there may be a patient responsible charge related to this service. The patient expressed understanding and agreed to proceed.  Location patient: home Location provider: work or home office Participants present for the call: patient, provider Patient did not have a visit in the prior 7 days to address this/these issue(s).   History of Present Illness:    Observations/Objective: Patient sounds cheerful and well on the phone. I do not appreciate any SOB. Speech and thought processing are grossly intact. Patient reported vitals:  Assessment and Plan: Pain management, meds were refilled.  Alysia Penna, MD   Follow Up Instructions:     [phone] 5-10 [phone] 11-20 9443 21-30 I did not refer this patient for an OV in the next 24 hours for this/these issue(s).  I discussed the assessment and treatment plan with the patient. The patient was provided an opportunity to ask questions and all were answered. The patient agreed with the plan and demonstrated an understanding of the instructions.   The patient was advised to call back or seek an in-person  evaluation if the symptoms worsen or if the condition fails to improve as anticipated.  I provided 12 minutes of non-face-to-face time during this encounter.   Alysia Penna, MD    Review of Systems     Objective:   Physical Exam        Assessment & Plan:  Pain management, meds were refilled.  Alysia Penna, MD

## 2021-04-06 DIAGNOSIS — H40013 Open angle with borderline findings, low risk, bilateral: Secondary | ICD-10-CM | POA: Diagnosis not present

## 2021-05-10 ENCOUNTER — Telehealth: Payer: Self-pay | Admitting: Family Medicine

## 2021-05-10 NOTE — Telephone Encounter (Signed)
Left message for patient to call back and schedule Medicare Annual Wellness Visit (AWV) either virtually or in office.   awvi per [facility] 08/16/12  please schedule at anytime with [facility] Nurse Health Advisor 1 or 2   This should be a 45 minute visit.

## 2021-05-16 ENCOUNTER — Ambulatory Visit: Payer: Medicare Other | Attending: Internal Medicine

## 2021-05-16 DIAGNOSIS — Z23 Encounter for immunization: Secondary | ICD-10-CM

## 2021-05-16 NOTE — Progress Notes (Signed)
   Covid-19 Vaccination Clinic  Name:  Deborah Bryant    MRN: [id number] DOB: [birth year]  05/16/2021  [name] was observed post Covid-19 immunization for 15 minutes without incident. She was provided with Vaccine Information Sheet and instruction to access the V-Safe system.   [name] was instructed to call 911 with any severe reactions post vaccine: Marland Kitchen Difficulty breathing  . Swelling of face and throat  . A fast heartbeat  . A bad rash all over body  . Dizziness and weakness   Immunizations Administered    Name Date Dose VIS Date Route   PFIZER Comrnaty(Gray TOP) Covid-19 Vaccine 05/16/2021 11:19 AM 0.3 mL 11/23/2020 Intramuscular   Manufacturer: [employer], [employer]   Lot: [id number]   NDC: [phone]

## 2021-05-17 ENCOUNTER — Other Ambulatory Visit: Payer: Self-pay | Admitting: Family Medicine

## 2021-05-17 ENCOUNTER — Other Ambulatory Visit: Payer: Self-pay

## 2021-05-17 MED ORDER — DILTIAZEM HCL ER BEADS 240 MG PO CP24: 240.0000 mg | ORAL_CAPSULE | Freq: Every day | ORAL | 0 refills | Status: DC

## 2021-05-21 ENCOUNTER — Other Ambulatory Visit (HOSPITAL_BASED_OUTPATIENT_CLINIC_OR_DEPARTMENT_OTHER): Payer: Self-pay

## 2021-05-21 DIAGNOSIS — Z23 Encounter for immunization: Secondary | ICD-10-CM | POA: Diagnosis not present

## 2021-05-21 MED ORDER — PFIZER-BIONT COVID-19 VAC-TRIS 30 MCG/0.3ML IM SUSP
INTRAMUSCULAR | 0 refills | Status: DC
  Filled 2021-05-21: qty 0.3, 1d supply, fill #0

## 2021-05-22 ENCOUNTER — Other Ambulatory Visit (HOSPITAL_BASED_OUTPATIENT_CLINIC_OR_DEPARTMENT_OTHER): Payer: Self-pay

## 2021-05-28 DIAGNOSIS — H40013 Open angle with borderline findings, low risk, bilateral: Secondary | ICD-10-CM | POA: Diagnosis not present

## 2021-05-28 DIAGNOSIS — H18513 Endothelial corneal dystrophy, bilateral: Secondary | ICD-10-CM | POA: Diagnosis not present

## 2021-05-28 DIAGNOSIS — E119 Type 2 diabetes mellitus without complications: Secondary | ICD-10-CM | POA: Diagnosis not present

## 2021-05-28 DIAGNOSIS — H43813 Vitreous degeneration, bilateral: Secondary | ICD-10-CM | POA: Diagnosis not present

## 2021-05-28 DIAGNOSIS — Z79899 Other long term (current) drug therapy: Secondary | ICD-10-CM | POA: Diagnosis not present

## 2021-05-28 DIAGNOSIS — H2513 Age-related nuclear cataract, bilateral: Secondary | ICD-10-CM | POA: Diagnosis not present

## 2021-05-30 ENCOUNTER — Encounter: Payer: Self-pay | Admitting: Family Medicine

## 2021-05-30 ENCOUNTER — Other Ambulatory Visit: Payer: Self-pay

## 2021-05-30 ENCOUNTER — Telehealth (INDEPENDENT_AMBULATORY_CARE_PROVIDER_SITE_OTHER): Payer: Medicare Other | Admitting: Family Medicine

## 2021-05-30 DIAGNOSIS — F1190 Opioid use, unspecified, uncomplicated: Secondary | ICD-10-CM | POA: Diagnosis not present

## 2021-05-30 DIAGNOSIS — G8929 Other chronic pain: Secondary | ICD-10-CM | POA: Diagnosis not present

## 2021-05-30 DIAGNOSIS — M545 Low back pain, unspecified: Secondary | ICD-10-CM

## 2021-05-30 MED ORDER — HYDROCODONE-ACETAMINOPHEN 5-325 MG PO TABS: 1.0000 | ORAL_TABLET | Freq: Four times a day (QID) | ORAL | 0 refills | Status: DC | PRN

## 2021-05-30 MED ORDER — HYDROCODONE-ACETAMINOPHEN 5-325 MG PO TABS
1.0000 | ORAL_TABLET | Freq: Four times a day (QID) | ORAL | 0 refills | Status: DC | PRN
Start: 2021-07-17 — End: 2021-05-30

## 2021-05-30 MED ORDER — HYDROCODONE-ACETAMINOPHEN 5-325 MG PO TABS
1.0000 | ORAL_TABLET | Freq: Four times a day (QID) | ORAL | 0 refills | Status: DC | PRN
Start: 2021-06-16 — End: 2021-05-30

## 2021-05-30 NOTE — Progress Notes (Signed)
   Subjective:    Patient ID: Deborah Bryant, female    DOB: [birth year], 75 y.o.   MRN: [id number]  HPI Virtual Visit via Telephone Note  I connected with the patient on 05/30/21 at  1:30 PM EDT by telephone and verified that I am speaking with the correct person using two identifiers.   I discussed the limitations, risks, security and privacy concerns of performing an evaluation and management service by telephone and the availability of in person appointments. I also discussed with the patient that there may be a patient responsible charge related to this service. The patient expressed understanding and agreed to proceed.  Location patient: home Location provider: work or home office Participants present for the call: patient, provider Patient did not have a visit in the prior 7 days to address this/these issue(s).   History of Present Illness: Here for pain management, she is doing well.   Observations/Objective: Patient sounds cheerful and well on the phone. I do not appreciate any SOB. Speech and thought processing are grossly intact. Patient reported vitals:  Assessment and Plan: Pain management. Indication for chronic opioid: low back pain Medication and dose: Norco 5-325 # pills per month: 120 Last UDS date: 08-25-20 Opioid Treatment Agreement signed (Y/N): 03-19-18 Opioid Treatment Agreement last reviewed with patient:  05-30-21 NCCSRS reviewed this encounter (include red flags): Yes Meds were refilled.  Alysia Penna, MD   Follow Up Instructions:     [phone] 5-10 [phone] 11-20 [phone] 21-30 I did not refer this patient for an OV in the next 24 hours for this/these issue(s).  I discussed the assessment and treatment plan with the patient. The patient was provided an opportunity to ask questions and all were answered. The patient agreed with the plan and demonstrated an understanding of the instructions.   The patient was advised to call back or seek an in-person  evaluation if the symptoms worsen or if the condition fails to improve as anticipated.  I provided 15 minutes of non-face-to-face time during this encounter.   Alysia Penna, MD     Review of Systems     Objective:   Physical Exam        Assessment & Plan:

## 2021-06-22 ENCOUNTER — Telehealth: Payer: Self-pay | Admitting: Family Medicine

## 2021-06-22 NOTE — Telephone Encounter (Signed)
Pt returned the call to the office and at this present time she is not wanting to schedule this kind of appointment.

## 2021-06-22 NOTE — Telephone Encounter (Signed)
Left message for patient to call back and schedule Medicare Annual Wellness Visit (AWV) either virtually or in office.    awvi per [facility] 08/16/12 please schedule at anytime with [facility] Nurse Health Advisor 1 or 2   This should be a 45 minute visit.

## 2021-06-25 NOTE — Telephone Encounter (Signed)
Documented on spreadsheet 

## 2021-07-18 DIAGNOSIS — Z79899 Other long term (current) drug therapy: Secondary | ICD-10-CM | POA: Diagnosis not present

## 2021-07-18 DIAGNOSIS — R7989 Other specified abnormal findings of blood chemistry: Secondary | ICD-10-CM | POA: Diagnosis not present

## 2021-07-18 DIAGNOSIS — M2550 Pain in unspecified joint: Secondary | ICD-10-CM | POA: Diagnosis not present

## 2021-07-18 DIAGNOSIS — E1165 Type 2 diabetes mellitus with hyperglycemia: Secondary | ICD-10-CM | POA: Diagnosis not present

## 2021-07-18 DIAGNOSIS — M1009 Idiopathic gout, multiple sites: Secondary | ICD-10-CM | POA: Diagnosis not present

## 2021-07-18 DIAGNOSIS — Z6829 Body mass index (BMI) 29.0-29.9, adult: Secondary | ICD-10-CM | POA: Diagnosis not present

## 2021-07-18 DIAGNOSIS — M064 Inflammatory polyarthropathy: Secondary | ICD-10-CM | POA: Diagnosis not present

## 2021-07-18 DIAGNOSIS — E559 Vitamin D deficiency, unspecified: Secondary | ICD-10-CM | POA: Diagnosis not present

## 2021-07-18 DIAGNOSIS — M150 Primary generalized (osteo)arthritis: Secondary | ICD-10-CM | POA: Diagnosis not present

## 2021-07-18 DIAGNOSIS — E663 Overweight: Secondary | ICD-10-CM | POA: Diagnosis not present

## 2021-07-30 ENCOUNTER — Encounter (HOSPITAL_BASED_OUTPATIENT_CLINIC_OR_DEPARTMENT_OTHER): Payer: Self-pay

## 2021-07-30 ENCOUNTER — Ambulatory Visit: Payer: Medicare Other | Admitting: Family Medicine

## 2021-07-30 ENCOUNTER — Other Ambulatory Visit: Payer: Self-pay

## 2021-07-30 ENCOUNTER — Emergency Department (HOSPITAL_BASED_OUTPATIENT_CLINIC_OR_DEPARTMENT_OTHER)
Admission: EM | Admit: 2021-07-30 | Discharge: 2021-07-30 | Disposition: A | Discharge status: Home / Self Care | Payer: Medicare Other | Attending: Emergency Medicine | Admitting: Emergency Medicine

## 2021-07-30 DIAGNOSIS — N390 Urinary tract infection, site not specified: Secondary | ICD-10-CM | POA: Diagnosis not present

## 2021-07-30 DIAGNOSIS — E1122 Type 2 diabetes mellitus with diabetic chronic kidney disease: Secondary | ICD-10-CM | POA: Insufficient documentation

## 2021-07-30 DIAGNOSIS — D631 Anemia in chronic kidney disease: Secondary | ICD-10-CM | POA: Insufficient documentation

## 2021-07-30 DIAGNOSIS — I129 Hypertensive chronic kidney disease with stage 1 through stage 4 chronic kidney disease, or unspecified chronic kidney disease: Secondary | ICD-10-CM | POA: Insufficient documentation

## 2021-07-30 DIAGNOSIS — Z7984 Long term (current) use of oral hypoglycemic drugs: Secondary | ICD-10-CM | POA: Insufficient documentation

## 2021-07-30 DIAGNOSIS — F17210 Nicotine dependence, cigarettes, uncomplicated: Secondary | ICD-10-CM | POA: Diagnosis not present

## 2021-07-30 DIAGNOSIS — Z7982 Long term (current) use of aspirin: Secondary | ICD-10-CM | POA: Diagnosis not present

## 2021-07-30 DIAGNOSIS — R319 Hematuria, unspecified: Secondary | ICD-10-CM | POA: Diagnosis present

## 2021-07-30 DIAGNOSIS — J449 Chronic obstructive pulmonary disease, unspecified: Secondary | ICD-10-CM | POA: Insufficient documentation

## 2021-07-30 DIAGNOSIS — N182 Chronic kidney disease, stage 2 (mild): Secondary | ICD-10-CM | POA: Diagnosis not present

## 2021-07-30 DIAGNOSIS — Z79899 Other long term (current) drug therapy: Secondary | ICD-10-CM | POA: Insufficient documentation

## 2021-07-30 DIAGNOSIS — E039 Hypothyroidism, unspecified: Secondary | ICD-10-CM | POA: Insufficient documentation

## 2021-07-30 LAB — URINALYSIS, ROUTINE W REFLEX MICROSCOPIC
Glucose, UA: NEGATIVE mg/dL
Ketones, ur: 15 mg/dL — AB
Nitrite: POSITIVE — AB
Protein, ur: >300 mg/dL — AB
Specific Gravity, Urine: 1.025 (ref 1.005–1.030)
pH: 6.5 (ref 5.0–8.0)

## 2021-07-30 LAB — URINALYSIS, MICROSCOPIC (REFLEX)
RBC / HPF: >50 RBC/hpf (ref 0–5)
WBC, UA: >50 WBC/hpf (ref 0–5)

## 2021-07-30 MED ORDER — CEPHALEXIN 500 MG PO CAPS: 500.0000 mg | ORAL_CAPSULE | Freq: Three times a day (TID) | ORAL | 0 refills | Status: DC

## 2021-07-30 MED ORDER — CEPHALEXIN 250 MG PO CAPS
500.0000 mg | ORAL_CAPSULE | Freq: Once | ORAL | Status: AC
  Administered 2021-07-30: 500 mg via ORAL
  Filled 2021-07-30: qty 2

## 2021-07-30 NOTE — ED Triage Notes (Signed)
Pt states concern for UTI reports she has been having l burning with urination and blood in urine starting 2 days ago.

## 2021-07-30 NOTE — Discharge Instructions (Addendum)
You were seen in the ER today for a UTI.  We are starting you on keflex with is an antibiotic, you were given your first dose in the ER, please take this at home as prescribed.   Please take all of your antibiotics until finished. You may develop abdominal discomfort or diarrhea from the antibiotic.  You may help offset this with probiotics which you can buy at the store (ask your pharmacist if unable to find) or get probiotics in the form of eating yogurt. Do not eat or take the probiotics until 2 hours after your antibiotic. If you are unable to tolerate these side effects follow-up with your primary care provider or return to the emergency department.   If you begin to experience any blistering, rashes, swelling, or difficulty breathing seek medical care for evaluation of potentially more serious side effects.   Please be aware that this medication may interact with other medications you are taking, please be sure to discuss your medication list with your pharmacist. If on coumadin the antibiotic will effect your coumadin level.   Please follow up with your primary care provider for recheck of your symptoms as well as a recheck of your blood pressure as it was elevated in the ER today.  Return to the ER sooner for any new or worsening symptoms including but not limited to new or worsening pain, pain to your back or abdomen, fever, vomiting, dizziness, passing out, passing clots in your urine, inability to urinate, or any other concerns.

## 2021-07-30 NOTE — ED Provider Notes (Signed)
[facility] Provider Note   CSN: [id number] Arrival date & time: 07/30/21  0940     History Chief Complaint  Patient presents with   Dysuria    Deborah Bryant is a 75 y.o. female with a hx of COPD, GERD, DM, hypertension, hyperlipidemia, & CKD who presents to the ED with complaints of urinary sxs x 3 days. Patient reports dysuria, frequency, urgency, and hematuria. Describes hematuria as blood tinged/pink- no clot passage. No alleviating/aggravating factors. Denies fever, chills, nausea, vomiting, flank pain, or abdominal pain. This feels like her prior UTI.   HPI     Past Medical History:  Diagnosis Date   Allergic rhinitis    Anxiety    Arthritis    sees Dr. Gavin Pound    Cellulitis, periorbital 2006   left eye, due to MRSA, saw Dr. Erik Obey    Chronic kidney disease    Complication of anesthesia    COPD (chronic obstructive pulmonary disease) ([place])    Cough    Diabetes mellitus    Dyspnea    with exertion   GERD (gastroesophageal reflux disease)    OTC  Pepcid   Hemorrhoids    Hyperlipidemia    Hypertension    IBS (irritable bowel syndrome)    Loss of smell    Loss of taste    Memory changes    Menopause    MRSA (methicillin resistant Staphylococcus aureus) 2006   left orbit    Pneumonia    PONV (postoperative nausea and vomiting)    Tubular adenoma of colon 12/2006    Patient Active Problem List   Diagnosis Date Noted   Confusion 11/01/2019   Anosmia 11/01/2019   Ageusia 11/01/2019   Loss of smell 02/02/2019   Loss of taste 02/02/2019   Degenerative spondylolisthesis 09/29/2018   Chronic cough 09/09/2018   Low back pain 06/09/2018   Shoulder pain, bilateral 03/13/2018   CKD (chronic kidney disease) stage 2, GFR 60-89 ml/min 07/03/2017   IBS (irritable bowel syndrome) 07/03/2017   Vitamin D deficiency 08/01/2015   Mixed simple and mucopurulent chronic bronchitis ([place]) 06/11/2015   Chorioretinal scar, 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 Fuller Plan, tubular adenoma, repeat in 5 yrs    POLYPECTOMY     TUBAL LIGATION       OB History   No obstetric history on file.     Family History  Problem Relation Age of Onset   Alcohol abuse Mother 39       MVA   Diabetes Mother    Heart disease Father 78       MI   Heart disease Brother    Diabetes Brother    Arthritis Sister    Dementia Maternal Grandfather     Social History   Tobacco Use   Smoking status: Every Day    Packs/day: 1.00    Years: 1.00    Pack years: 1.00    Types: Cigarettes   Smokeless tobacco: Never  Substance Use Topics   Alcohol use: Not Currently    Alcohol/week: 0.0 standard drinks  Drug use: Never    Home Medications Prior to Admission medications   Medication Sig Start Date End Date Taking? Authorizing Provider  allopurinol (ZYLOPRIM) 300 MG tablet Take 1 tablet (300 mg total) by mouth daily. 08/25/20   Laurey Morale, MD  Ascorbic Acid (VITAMIN C) 1000 MG tablet Take 1,000 mg by mouth daily.    [provider]  aspirin EC 81 MG tablet Take 81 mg by mouth daily.    [provider]  atorvastatin (LIPITOR) 40 MG tablet TAKE 1 TABLET BY MOUTH DAILY 08/22/20   Laurey Morale, MD  Cholecalciferol (VITAMIN D) 2000 units CAPS Take 2,000 Units by mouth daily.     [provider]  COVID-19 mRNA Vac-TriS, Pfizer, (PFIZER-BIONT COVID-19 VAC-TRIS) SUSP injection Inject into the muscle. 05/16/21   Carlyle Basques, MD  diazepam (VALIUM) 5 MG tablet Take 1-2 tablets (5-10 mg total) by mouth every 8 (eight) hours as needed for  muscle spasms. 03/17/21   Laurey Morale, MD  diclofenac (VOLTAREN) 75 MG EC tablet Take 1 tablet (75 mg total) by mouth daily. 08/25/20   Laurey Morale, MD  diltiazem (TIAZAC) 240 MG 24 hr capsule Take 1 capsule (240 mg total) by mouth daily. 05/17/21   Laurey Morale, MD  famotidine (PEPCID) 10 MG tablet Take 10 mg by mouth 2 (two) times daily.    [provider]  glipiZIDE (GLUCOTROL XL) 5 MG 24 hr tablet TAKE 1 TABLET BY MOUTH DAILY WITH BREAKFAST 08/22/20   Laurey Morale, MD  hydrochlorothiazide (HYDRODIURIL) 25 MG tablet TAKE 1 TABLET BY MOUTH DAILY 08/22/20   Laurey Morale, MD  HYDROcodone-acetaminophen (NORCO) 5-325 MG tablet Take 1 tablet by mouth every 6 (six) hours as needed for moderate pain. 08/17/21 09/16/21  Laurey Morale, MD  hydroxychloroquine (PLAQUENIL) 200 MG tablet Take 200 mg by mouth daily.     [provider]  polyethylene glycol powder (GLYCOLAX/MIRALAX) powder Take 8.5-17 g by mouth at bedtime.     [provider]  saccharomyces boulardii (FLORASTOR) 250 MG capsule Take 250 mg by mouth daily.    [provider]    Allergies    Losartan, Benazepril, Lasix [furosemide], Levaquin [levofloxacin], Phenobarbital, and Tape  Review of Systems   Review of Systems  Constitutional:  Negative for chills and fever.  Respiratory:  Negative for shortness of breath.   Cardiovascular:  Negative for chest pain.  Gastrointestinal:  Negative for abdominal pain, diarrhea, nausea and vomiting.  Genitourinary:  Positive for dysuria, frequency, hematuria and urgency. Negative for flank pain.  Neurological:  Negative for syncope.  All other systems reviewed and are negative.  Physical Exam Updated Vital Signs BP (!) 170/67 (BP Location: Left Arm)   Pulse 71   Temp 98.5 F (36.9 C) (Oral)   Resp 18   Ht '5\' 2"'$  (1.575 m)   Wt 74.8 kg   SpO2 94%   BMI 30.18 kg/m   Physical Exam Vitals and nursing note reviewed.  Constitutional:      General: She is  not in acute distress.    Appearance: She is well-developed. She is not toxic-appearing.  HENT:     Head: Normocephalic and atraumatic.  Eyes:     General:        Right eye: No discharge.        Left eye: No discharge.     Conjunctiva/sclera: Conjunctivae normal.  Cardiovascular:     Rate and Rhythm: Normal rate and  regular rhythm.  Pulmonary:     Effort: Pulmonary effort is normal. No respiratory distress.     Breath sounds: Normal breath sounds. No wheezing, rhonchi or rales.  Abdominal:     General: There is no distension.     Palpations: Abdomen is soft.     Tenderness: There is no abdominal tenderness. There is no right CVA tenderness, left CVA tenderness, guarding or rebound.  Musculoskeletal:     Cervical back: Neck supple.  Skin:    General: Skin is warm and dry.     Findings: No rash.  Neurological:     Mental Status: She is alert.     Comments: Clear speech.   Psychiatric:        Behavior: Behavior normal.    ED Results / Procedures / Treatments   Labs (all labs ordered are listed, but only abnormal results are displayed) Labs Reviewed  URINE CULTURE  URINALYSIS, ROUTINE W REFLEX MICROSCOPIC    EKG None  Radiology No results found.  Procedures Procedures   Medications Ordered in ED Medications - No data to display  ED Course  I have reviewed the triage vital signs and the nursing notes.  Pertinent labs & imaging results that were available during my care of the patient were reviewed by me and considered in my medical decision making (see chart for details).    MDM Rules/Calculators/A&P                           Patient presents to the ED with complaints of urinary sxs. Nontoxic, vitals w/ elevated BP- otherwise unremarkable, doubt HTN emergency- PCP recheck.   Additional history obtained:  Additional history obtained from chart review & nursing note review.  Most recent urine culture grew out multiple species.   Lab Tests:  I reviewed and  interpreted labs, which included:  UA: Consistent w/ infection with associated hematuria.   ED Course:  History & UA consistent w/ UTI. Patient is not toxic or septic appearing. No fever, vomiting, flank pain, or CVA tenderness to suggest pyelonephritis. Abdomen nontender w/o peritoneal signs. Will start on keflex, culture pending, PCP follow up. I discussed results, treatment plan, need for follow-up, and return precautions with the patient. Provided opportunity for questions, patient confirmed understanding and is in agreement with plan.   Discussed with attending Dr. Regenia Skeeter- in agreement.   Portions of this note were generated with [employer]. Dictation errors may occur despite best attempts at proofreading.  Final Clinical Impression(s) / ED Diagnoses Final diagnoses:  Acute UTI    Rx / DC Orders ED Discharge Orders          Ordered    cephALEXin (KEFLEX) 500 MG capsule  3 times daily        07/30/21 [address] [place], PA-C 07/30/21 1114    Sherwood Gambler, MD 08/01/21 [id number]

## 2021-07-31 LAB — URINE CULTURE: Culture: <10000 — AB

## 2021-08-24 ENCOUNTER — Other Ambulatory Visit (HOSPITAL_BASED_OUTPATIENT_CLINIC_OR_DEPARTMENT_OTHER): Payer: Self-pay

## 2021-08-27 ENCOUNTER — Encounter: Payer: Self-pay | Admitting: Family Medicine

## 2021-08-27 ENCOUNTER — Other Ambulatory Visit: Payer: Self-pay

## 2021-08-27 ENCOUNTER — Ambulatory Visit (INDEPENDENT_AMBULATORY_CARE_PROVIDER_SITE_OTHER): Payer: Medicare Other | Admitting: Family Medicine

## 2021-08-27 VITALS — BP 128/80 | HR 58 | Temp 97.9°F | Ht 62.0 in | Wt 163.0 lb

## 2021-08-27 DIAGNOSIS — E039 Hypothyroidism, unspecified: Secondary | ICD-10-CM

## 2021-08-27 DIAGNOSIS — Z23 Encounter for immunization: Secondary | ICD-10-CM

## 2021-08-27 DIAGNOSIS — E782 Mixed hyperlipidemia: Secondary | ICD-10-CM | POA: Diagnosis not present

## 2021-08-27 DIAGNOSIS — I10 Essential (primary) hypertension: Secondary | ICD-10-CM | POA: Diagnosis not present

## 2021-08-27 DIAGNOSIS — G8929 Other chronic pain: Secondary | ICD-10-CM

## 2021-08-27 DIAGNOSIS — E119 Type 2 diabetes mellitus without complications: Secondary | ICD-10-CM | POA: Diagnosis not present

## 2021-08-27 DIAGNOSIS — E559 Vitamin D deficiency, unspecified: Secondary | ICD-10-CM

## 2021-08-27 DIAGNOSIS — N182 Chronic kidney disease, stage 2 (mild): Secondary | ICD-10-CM | POA: Diagnosis not present

## 2021-08-27 DIAGNOSIS — M545 Low back pain, unspecified: Secondary | ICD-10-CM

## 2021-08-27 DIAGNOSIS — R319 Hematuria, unspecified: Secondary | ICD-10-CM

## 2021-08-27 DIAGNOSIS — M1000 Idiopathic gout, unspecified site: Secondary | ICD-10-CM

## 2021-08-27 DIAGNOSIS — F1190 Opioid use, unspecified, uncomplicated: Secondary | ICD-10-CM

## 2021-08-27 DIAGNOSIS — K589 Irritable bowel syndrome without diarrhea: Secondary | ICD-10-CM

## 2021-08-27 LAB — URINALYSIS, ROUTINE W REFLEX MICROSCOPIC
Bilirubin Urine: NEGATIVE
Hgb urine dipstick: NEGATIVE
Ketones, ur: NEGATIVE
Leukocytes,Ua: NEGATIVE
Nitrite: NEGATIVE
RBC / HPF: NONE SEEN (ref 0–?)
Specific Gravity, Urine: 1.015 (ref 1.000–1.030)
Total Protein, Urine: 100 — AB
Urine Glucose: NEGATIVE
Urobilinogen, UA: 0.2 (ref 0.0–1.0)
pH: 7 (ref 5.0–8.0)

## 2021-08-27 LAB — LIPID PANEL
Cholesterol: 147 mg/dL (ref 0–200)
HDL: 93.2 mg/dL (ref >39.00)
LDL Cholesterol: 39 mg/dL (ref 0–99)
NonHDL: 53.47
Total CHOL/HDL Ratio: 2
Triglycerides: 71 mg/dL (ref 0.0–149.0)
VLDL: 14.2 mg/dL (ref 0.0–40.0)

## 2021-08-27 LAB — BASIC METABOLIC PANEL
BUN: 13 mg/dL (ref 6–23)
CO2: 28 mEq/L (ref 19–32)
Calcium: 10 mg/dL (ref 8.4–10.5)
Chloride: 94 mEq/L — ABNORMAL LOW (ref 96–112)
Creatinine, Ser: 0.78 mg/dL (ref 0.40–1.20)
GFR: 74.51 mL/min (ref >60.00)
Glucose, Bld: 121 mg/dL — ABNORMAL HIGH (ref 70–99)
Potassium: 3.5 mEq/L (ref 3.5–5.1)
Sodium: 132 mEq/L — ABNORMAL LOW (ref 135–145)

## 2021-08-27 LAB — TSH: TSH: 1.97 u[IU]/mL (ref 0.35–5.50)

## 2021-08-27 MED ORDER — HYDROCHLOROTHIAZIDE 25 MG PO TABS: 25.0000 mg | ORAL_TABLET | Freq: Every day | ORAL | 3 refills | Status: DC

## 2021-08-27 MED ORDER — ATORVASTATIN CALCIUM 40 MG PO TABS: 40.0000 mg | ORAL_TABLET | Freq: Every day | ORAL | 3 refills | Status: DC

## 2021-08-27 MED ORDER — DIAZEPAM 5 MG PO TABS: 5.0000 mg | ORAL_TABLET | Freq: Three times a day (TID) | ORAL | 5 refills | Status: DC | PRN

## 2021-08-27 MED ORDER — GLIPIZIDE ER 5 MG PO TB24: 5.0000 mg | ORAL_TABLET | Freq: Every day | ORAL | 3 refills | Status: DC

## 2021-08-27 MED ORDER — ALLOPURINOL 300 MG PO TABS: 300.0000 mg | ORAL_TABLET | Freq: Every day | ORAL | 3 refills | Status: DC

## 2021-08-27 MED ORDER — DILTIAZEM HCL ER BEADS 240 MG PO CP24: 240.0000 mg | ORAL_CAPSULE | Freq: Every day | ORAL | 3 refills | Status: DC

## 2021-08-27 NOTE — Progress Notes (Signed)
Subjective:    Patient ID: Deborah Bryant, female    DOB: June 10, 1946, 75 y.o.   MRN: [id number]  HPI Here to follow up on issues. First she started seeing blood in her urine about 8 weeks ago. At first she also had burning on urination, and on 07-30-21 she went to urgent care. The UA had large blood in it and was positive for nitrite and leukocytes. The culture showed insignificant growth. She was treated with Keflex and the burning went away, but the blood has continued. She is worried because she is a lifelong smoker and her father had bladder cancer. Also she saw Dr. Trudie Reed recently to follow up on her arthritis, and she did extensive lab work. Of note, her creatinine was 1.25 and her GFR was 45. Her A1c was 6.4.    Review of Systems  Constitutional: Negative.   HENT: Negative.    Eyes: Negative.   Respiratory: Negative.    Cardiovascular: Negative.   Gastrointestinal: Negative.   Genitourinary:  Positive for hematuria. Negative for decreased urine volume, difficulty urinating, dyspareunia, dysuria, enuresis, flank pain, frequency, pelvic pain and urgency.  Musculoskeletal:  Positive for arthralgias and back pain.  Skin: Negative.   Neurological: Negative.  Negative for headaches.  Psychiatric/Behavioral: Negative.        Objective:   Physical Exam Constitutional:      General: She is not in acute distress.    Appearance: Normal appearance. She is well-developed.  HENT:     Head: Normocephalic and atraumatic.     Right Ear: External ear normal.     Left Ear: External ear normal.     Nose: Nose normal.     Mouth/Throat:     Pharynx: No oropharyngeal exudate.  Eyes:     General: No scleral icterus.    Conjunctiva/sclera: Conjunctivae normal.     Pupils: Pupils are equal, round, and reactive to light.  Neck:     Thyroid: No thyromegaly.     Vascular: No JVD.  Cardiovascular:     Rate and Rhythm: Normal rate and regular rhythm.     Heart sounds: Normal heart sounds. No  murmur heard.   No friction rub. No gallop.  Pulmonary:     Effort: Pulmonary effort is normal. No respiratory distress.     Breath sounds: Normal breath sounds. No wheezing or rales.  Chest:     Chest wall: No tenderness.  Abdominal:     General: Bowel sounds are normal. There is no distension.     Palpations: Abdomen is soft. There is no mass.     Tenderness: There is no abdominal tenderness. There is no guarding or rebound.  Musculoskeletal:        General: No tenderness. Normal range of motion.     Cervical back: Normal range of motion and neck supple.  Lymphadenopathy:     Cervical: No cervical adenopathy.  Skin:    General: Skin is warm and dry.     Findings: No erythema or rash.  Neurological:     Mental Status: She is alert and oriented to person, place, and time.     Cranial Nerves: No cranial nerve deficit.     Motor: No abnormal muscle tone.     Coordination: Coordination normal.     Deep Tendon Reflexes: Reflexes are normal and symmetric. Reflexes normal.  Psychiatric:        Behavior: Behavior normal.        Thought Content: Thought content normal.  Judgment: Judgment normal.          Assessment & Plan:  For the hematuria, we will refer her to Urology. For the CKD, she will see Dr. Moshe Cipro later this week. Her arthritis and GERD are stable. Her diabetes and HTN are stable. We will get fasting labs today to check lipids and a TSH. She is past due for a colonoscopy, but she is very busy dealing her husband's medical issues, and she wants to postpone this for a little ater. We spent 35 minutes reviewing records and discussing these issues.  Alysia Penna, MD

## 2021-08-27 NOTE — Addendum Note (Signed)
Addended by: Wyvonne Lenz on: 08/27/2021 11:00 AM   Modules accepted: Orders

## 2021-08-30 DIAGNOSIS — R432 Parageusia: Secondary | ICD-10-CM | POA: Diagnosis not present

## 2021-08-30 DIAGNOSIS — N182 Chronic kidney disease, stage 2 (mild): Secondary | ICD-10-CM | POA: Diagnosis not present

## 2021-08-30 DIAGNOSIS — Z720 Tobacco use: Secondary | ICD-10-CM | POA: Diagnosis not present

## 2021-08-30 DIAGNOSIS — I129 Hypertensive chronic kidney disease with stage 1 through stage 4 chronic kidney disease, or unspecified chronic kidney disease: Secondary | ICD-10-CM | POA: Diagnosis not present

## 2021-08-30 DIAGNOSIS — R319 Hematuria, unspecified: Secondary | ICD-10-CM | POA: Diagnosis not present

## 2021-08-30 DIAGNOSIS — Z6832 Body mass index (BMI) 32.0-32.9, adult: Secondary | ICD-10-CM | POA: Diagnosis not present

## 2021-08-30 DIAGNOSIS — R4182 Altered mental status, unspecified: Secondary | ICD-10-CM | POA: Diagnosis not present

## 2021-08-31 ENCOUNTER — Ambulatory Visit (INDEPENDENT_AMBULATORY_CARE_PROVIDER_SITE_OTHER): Payer: Medicare Other | Admitting: Family Medicine

## 2021-08-31 ENCOUNTER — Encounter: Payer: Self-pay | Admitting: Family Medicine

## 2021-08-31 DIAGNOSIS — F1190 Opioid use, unspecified, uncomplicated: Secondary | ICD-10-CM | POA: Diagnosis not present

## 2021-08-31 DIAGNOSIS — M545 Low back pain, unspecified: Secondary | ICD-10-CM | POA: Diagnosis not present

## 2021-08-31 DIAGNOSIS — G8929 Other chronic pain: Secondary | ICD-10-CM | POA: Diagnosis not present

## 2021-08-31 LAB — DRUG MONITOR, PANEL 1, W/CONF, URINE
Alphahydroxyalprazolam: NEGATIVE ng/mL (ref <25)
Alphahydroxymidazolam: NEGATIVE ng/mL (ref <50)
Alphahydroxytriazolam: NEGATIVE ng/mL (ref <50)
Aminoclonazepam: NEGATIVE ng/mL (ref <25)
Amphetamines: NEGATIVE ng/mL (ref <500)
Barbiturates: NEGATIVE ng/mL (ref <300)
Benzodiazepines: POSITIVE ng/mL — AB (ref <100)
Cocaine Metabolite: NEGATIVE ng/mL (ref <150)
Codeine: NEGATIVE ng/mL (ref <50)
Creatinine: 75.9 mg/dL (ref >=20.0)
Hydrocodone: 854 ng/mL — ABNORMAL HIGH (ref <50)
Hydromorphone: 806 ng/mL — ABNORMAL HIGH (ref <50)
Hydroxyethylflurazepam: NEGATIVE ng/mL (ref <50)
Lorazepam: NEGATIVE ng/mL (ref <50)
Marijuana Metabolite: NEGATIVE ng/mL (ref <20)
Methadone Metabolite: NEGATIVE ng/mL (ref <100)
Morphine: NEGATIVE ng/mL (ref <50)
Nordiazepam: 306 ng/mL — ABNORMAL HIGH (ref <50)
Norhydrocodone: 1742 ng/mL — ABNORMAL HIGH (ref <50)
Opiates: POSITIVE ng/mL — AB (ref <100)
Oxazepam: 840 ng/mL — ABNORMAL HIGH (ref <50)
Oxidant: NEGATIVE ug/mL (ref <200)
Oxycodone: NEGATIVE ng/mL (ref <100)
Phencyclidine: NEGATIVE ng/mL (ref <25)
Temazepam: 392 ng/mL — ABNORMAL HIGH (ref <50)
pH: 7.2 (ref 4.5–9.0)

## 2021-08-31 LAB — DM TEMPLATE

## 2021-08-31 MED ORDER — HYDROCODONE-ACETAMINOPHEN 5-325 MG PO TABS: 1.0000 | ORAL_TABLET | Freq: Four times a day (QID) | ORAL | 0 refills | Status: DC | PRN

## 2021-08-31 NOTE — Progress Notes (Signed)
   Subjective:    Patient ID: Deborah Bryant, female    DOB: [birth year], 75 y.o.   MRN: [id number]  HPI Virtual Visit via Telephone Note  I connected with the patient on 08/31/21 at  1:00 PM EDT by telephone and verified that I am speaking with the correct person using two identifiers.   I discussed the limitations, risks, security and privacy concerns of performing an evaluation and management service by telephone and the availability of in person appointments. I also discussed with the patient that there may be a patient responsible charge related to this service. The patient expressed understanding and agreed to proceed.  Location patient: home Location provider: work or home office Participants present for the call: patient, provider Patient did not have a visit in the prior 7 days to address this/these issue(s).   History of Present Illness: Here for pain management. She is doing fairly well we recently told her to stop taking Diclofenac because of her renal status, so her pain levels have increased a bit. She says this is tolerable so far.   Observations/Objective: Patient sounds cheerful and well on the phone. I do not appreciate any SOB. Speech and thought processing are grossly intact. Patient reported vitals:  Assessment and Plan: Pain management. Indication for chronic opioid: low back pain Medication and dose: Norco 5-325 # pills per month: 120 Last UDS date: 08-27-21 Opioid Treatment Agreement signed (Y/N): 03-19-18 Opioid Treatment Agreement last reviewed with patient:  08-31-21 NCCSRS reviewed this encounter (include red flags): Yes Meds were refilled. Alysia Penna, MD   Follow Up Instructions:     [phone] 5-10 [phone] 11-20 [phone] 21-30 I did not refer this patient for an OV in the next 24 hours for this/these issue(s).  I discussed the assessment and treatment plan with the patient. The patient was provided an opportunity to ask questions and all were answered.  The patient agreed with the plan and demonstrated an understanding of the instructions.   The patient was advised to call back or seek an in-person evaluation if the symptoms worsen or if the condition fails to improve as anticipated.  I provided 14 minutes of non-face-to-face time during this encounter.   Alysia Penna, MD     Review of Systems     Objective:   Physical Exam        Assessment & Plan:

## 2021-09-04 DIAGNOSIS — R310 Gross hematuria: Secondary | ICD-10-CM | POA: Diagnosis not present

## 2021-09-11 ENCOUNTER — Ambulatory Visit: Payer: Medicare Other | Attending: Internal Medicine

## 2021-09-11 DIAGNOSIS — Z23 Encounter for immunization: Secondary | ICD-10-CM

## 2021-09-11 NOTE — Progress Notes (Signed)
   Covid-19 Vaccination Clinic  Name:  Deborah Bryant    MRN: [id number] DOB: April 05, 1946  09/11/2021  Deborah Bryant was observed post Covid-19 immunization for 15 minutes without incident. She was provided with Vaccine Information Sheet and instruction to access the V-Safe system.   Deborah Bryant was instructed to call 911 with any severe reactions post vaccine: Difficulty breathing  Swelling of face and throat  A fast heartbeat  A bad rash all over body  Dizziness and weakness

## 2021-09-21 ENCOUNTER — Other Ambulatory Visit (HOSPITAL_BASED_OUTPATIENT_CLINIC_OR_DEPARTMENT_OTHER): Payer: Self-pay

## 2021-09-21 DIAGNOSIS — Z23 Encounter for immunization: Secondary | ICD-10-CM | POA: Diagnosis not present

## 2021-09-21 MED ORDER — COVID-19MRNA BIVAL VACC PFIZER 30 MCG/0.3ML IM SUSP
INTRAMUSCULAR | 0 refills | Status: DC
  Filled 2021-09-21: qty 0.3, 1d supply, fill #0

## 2021-10-03 ENCOUNTER — Telehealth: Payer: Self-pay | Admitting: Family Medicine

## 2021-10-03 DIAGNOSIS — Z1231 Encounter for screening mammogram for malignant neoplasm of breast: Secondary | ICD-10-CM | POA: Diagnosis not present

## 2021-10-03 NOTE — Telephone Encounter (Signed)
Cindy from call from [facility] and stated she need BMP lab sent to her .Cindy's fax # is [phone].

## 2021-10-04 NOTE — Telephone Encounter (Signed)
Pt labs faxed to [facility] with [facility] as requested

## 2021-10-05 DIAGNOSIS — N1330 Unspecified hydronephrosis: Secondary | ICD-10-CM | POA: Diagnosis not present

## 2021-10-05 DIAGNOSIS — R310 Gross hematuria: Secondary | ICD-10-CM | POA: Diagnosis not present

## 2021-10-18 DIAGNOSIS — R310 Gross hematuria: Secondary | ICD-10-CM | POA: Diagnosis not present

## 2021-10-22 ENCOUNTER — Other Ambulatory Visit: Payer: Self-pay | Admitting: Urology

## 2021-10-22 ENCOUNTER — Other Ambulatory Visit (HOSPITAL_COMMUNITY): Payer: Self-pay | Admitting: Urology

## 2021-10-22 DIAGNOSIS — N130 Hydronephrosis with ureteropelvic junction obstruction: Secondary | ICD-10-CM

## 2021-10-29 ENCOUNTER — Other Ambulatory Visit: Payer: Self-pay | Admitting: Urology

## 2021-10-29 ENCOUNTER — Other Ambulatory Visit (HOSPITAL_COMMUNITY): Payer: Medicare Other

## 2021-11-02 ENCOUNTER — Other Ambulatory Visit: Payer: Self-pay

## 2021-11-02 ENCOUNTER — Other Ambulatory Visit (HOSPITAL_COMMUNITY): Payer: Self-pay | Admitting: Urology

## 2021-11-02 ENCOUNTER — Ambulatory Visit (HOSPITAL_COMMUNITY)
Admission: RE | Admit: 2021-11-02 | Discharge: 2021-11-02 | Disposition: A | Discharge status: Home / Self Care | Payer: Medicare Other | Source: Ambulatory Visit | Attending: Urology | Admitting: Urology

## 2021-11-02 DIAGNOSIS — N130 Hydronephrosis with ureteropelvic junction obstruction: Secondary | ICD-10-CM

## 2021-11-02 DIAGNOSIS — N1330 Unspecified hydronephrosis: Secondary | ICD-10-CM | POA: Diagnosis not present

## 2021-11-02 MED ORDER — TECHNETIUM TC 99M MERTIATIDE
4.1500 | Freq: Once | INTRAVENOUS | Status: AC
  Administered 2021-11-02: 4.15 via INTRAVENOUS

## 2021-11-16 DIAGNOSIS — N183 Chronic kidney disease, stage 3 unspecified: Secondary | ICD-10-CM | POA: Diagnosis not present

## 2021-11-23 NOTE — Progress Notes (Signed)
Deborah Bryant  11/23/2021   Your procedure is scheduled on:   12/11/2021   Report to [facility] Main  Entrance   Report to admitting at   1015AM     Call this number if you have problems the morning of surgery [phone]    Remember: Do not eat food , candy gum or mints :After Midnight. You may have clear liquids from midnight until __  0930am    CLEAR LIQUID DIET   Foods Allowed                                                                       Coffee and tea, regular and decaf                              Plain Jell-O any favor except red or purple                                            Fruit ices (not with fruit pulp)                                      Iced Popsicles                                     Carbonated beverages, regular and diet                                    Cranberry, grape and apple juices Sports drinks like Gatorade Lightly seasoned clear broth or consume(fat free) Sugar   _____________________________________________________________________    BRUSH YOUR TEETH MORNING OF SURGERY AND RINSE YOUR MOUTH OUT, NO CHEWING GUM CANDY OR MINTS.     Take these medicines the morning of surgery with A SIP OF WATER:  allopurinol, diltiazem, pepcid   DO NOT TAKE ANY DIABETIC MEDICATIONS DAY OF YOUR SURGERY                               You may not have any metal on your body including hair pins and              piercings  Do not wear jewelry, make-up, lotions, powders or perfumes, deodorant             Do not wear nail polish on your fingernails.  Do not shave  48 hours prior to surgery.              Men may shave face and neck.   Do not bring valuables to the hospital. [place].  Contacts, dentures or  bridgework may not be worn into surgery.  Leave suitcase in the car. After surgery it may be brought to your room.     Patients discharged the day of surgery will not be  allowed to drive home. IF YOU ARE HAVING SURGERY AND GOING HOME THE SAME DAY, YOU MUST HAVE AN ADULT TO DRIVE YOU HOME AND BE WITH YOU FOR 24 HOURS. YOU MAY GO HOME BY TAXI OR UBER OR ORTHERWISE, BUT AN ADULT MUST ACCOMPANY YOU HOME AND STAY WITH YOU FOR 24 HOURS.  Name and phone number of your driver:  Special Instructions: N/A              Please read over the following fact sheets you were given: _____________________________________________________________________  [facility] - Preparing for Surgery Before surgery, you can play an important role.  Because skin is not sterile, your skin needs to be as free of germs as possible.  You can reduce the number of germs on your skin by washing with CHG (chlorahexidine gluconate) soap before surgery.  CHG is an antiseptic cleaner which kills germs and bonds with the skin to continue killing germs even after washing. Please DO NOT use if you have an allergy to CHG or antibacterial soaps.  If your skin becomes reddened/irritated stop using the CHG and inform your nurse when you arrive at Short Stay. Do not shave (including legs and underarms) for at least 48 hours prior to the first CHG shower.  You may shave your face/neck. Please follow these instructions carefully:  1.  Shower with CHG Soap the night before surgery and the  morning of Surgery.  2.  If you choose to wash your hair, wash your hair first as usual with your  normal  shampoo.  3.  After you shampoo, rinse your hair and body thoroughly to remove the  shampoo.                           4.  Use CHG as you would any other liquid soap.  You can apply chg directly  to the skin and wash                       Gently with a scrungie or clean washcloth.  5.  Apply the CHG Soap to your body ONLY FROM THE NECK DOWN.   Do not use on face/ open                           Wound or open sores. Avoid contact with eyes, ears mouth and genitals (private parts).                       Wash face,  Genitals  (private parts) with your normal soap.             6.  Wash thoroughly, paying special attention to the area where your surgery  will be performed.  7.  Thoroughly rinse your body with warm water from the neck down.  8.  DO NOT shower/wash with your normal soap after using and rinsing off  the CHG Soap.                9.  Pat yourself dry with a clean towel.            10.  Wear clean pajamas.  11.  Place clean sheets on your bed the night of your first shower and do not  sleep with pets. Day of Surgery : Do not apply any lotions/deodorants the morning of surgery.  Please wear clean clothes to the hospital/surgery center.  FAILURE TO FOLLOW THESE INSTRUCTIONS MAY RESULT IN THE CANCELLATION OF YOUR SURGERY PATIENT SIGNATURE_________________________________  NURSE SIGNATURE__________________________________  ________________________________________________________________________

## 2021-11-23 NOTE — Progress Notes (Signed)
Anesthesia Review:  PCP: Cardiologist : Chest x-ray : EKG : Echo : Stress test: Cardiac Cath :  Activity level:  Sleep Study/ CPAP : Fasting Blood Sugar :      / Checks Blood Sugar -- times a day:   Blood Thinner/ Instructions /Last Dose: ASA / Instructions/ Last Dose :  

## 2021-11-27 ENCOUNTER — Encounter (HOSPITAL_COMMUNITY): Payer: Self-pay

## 2021-11-27 ENCOUNTER — Encounter (HOSPITAL_COMMUNITY)
Admission: RE | Admit: 2021-11-27 | Discharge: 2021-11-27 | Disposition: A | Discharge status: Home / Self Care | Payer: Medicare Other | Source: Ambulatory Visit | Attending: Urology | Admitting: Urology

## 2021-11-27 ENCOUNTER — Other Ambulatory Visit: Payer: Self-pay

## 2021-11-27 VITALS — BP 174/68 | HR 57 | Temp 97.9°F | Resp 16 | Ht 62.0 in | Wt 158.0 lb

## 2021-11-27 DIAGNOSIS — Z01818 Encounter for other preprocedural examination: Secondary | ICD-10-CM | POA: Diagnosis not present

## 2021-11-27 DIAGNOSIS — E119 Type 2 diabetes mellitus without complications: Secondary | ICD-10-CM | POA: Insufficient documentation

## 2021-11-27 LAB — BASIC METABOLIC PANEL
Anion gap: 10 (ref 5–15)
BUN: 17 mg/dL (ref 8–23)
CO2: 28 mmol/L (ref 22–32)
Calcium: 9.5 mg/dL (ref 8.9–10.3)
Chloride: 100 mmol/L (ref 98–111)
Creatinine, Ser: 1.01 mg/dL — ABNORMAL HIGH (ref 0.44–1.00)
GFR, Estimated: 58 mL/min — ABNORMAL LOW (ref >60)
Glucose, Bld: 107 mg/dL — ABNORMAL HIGH (ref 70–99)
Potassium: 3.7 mmol/L (ref 3.5–5.1)
Sodium: 138 mmol/L (ref 135–145)

## 2021-11-27 LAB — CBC
HCT: 43.9 % (ref 36.0–46.0)
Hemoglobin: 14.4 g/dL (ref 12.0–15.0)
MCH: 29.4 pg (ref 26.0–34.0)
MCHC: 32.8 g/dL (ref 30.0–36.0)
MCV: 89.6 fL (ref 80.0–100.0)
Platelets: 330 10*3/uL (ref 150–400)
RBC: 4.9 MIL/uL (ref 3.87–5.11)
RDW: 13.8 % (ref 11.5–15.5)
WBC: 11.7 10*3/uL — ABNORMAL HIGH (ref 4.0–10.5)
nRBC: 0 % (ref 0.0–0.2)

## 2021-11-27 LAB — HEMOGLOBIN A1C
Hgb A1c MFr Bld: 6.4 % — ABNORMAL HIGH (ref 4.8–5.6)
Mean Plasma Glucose: 136.98 mg/dL

## 2021-11-27 LAB — GLUCOSE, CAPILLARY: Glucose-Capillary: 123 mg/dL — ABNORMAL HIGH (ref 70–99)

## 2021-11-27 NOTE — Progress Notes (Addendum)
Anesthesia Review:  PCP: DR Cherlynn Perches  Cardiologist : none  Chest x-ray : EKG : 11/27/21  Echo : Stress test: Cardiac Cath :  Activity level: can do a flight of stairs without difficulty  Sleep Study/ CPAP : none  Fasting Blood Sugar :      / Checks Blood Sugar -- times a day:   Blood Thinner/ Instructions /Last Dose: ASA / Instructions/ Last Dose :  DM- type 2  Does not check glucose at home  Hgba1c- 11/27/21-   81 mg aspirin- stop 5 days prior per pt  PT reports  at preop that with 2019 back surgery at [facility]  had hard time putting pt to sleep per pt Since that time she has had no sense of taste or smell per pt.  She has had MRI done per DR Sharlene Motts and seen by some neurologist.   No covid test - ambulatory surgery  Heart rate 50 on preop ekg ,  Jessica Ward, PAC made aware and saw EKG.  NO new orders given.   Chart given to [facility] for followup  on 11/27/21.

## 2021-12-03 DIAGNOSIS — R311 Benign essential microscopic hematuria: Secondary | ICD-10-CM | POA: Diagnosis not present

## 2021-12-03 DIAGNOSIS — R310 Gross hematuria: Secondary | ICD-10-CM | POA: Diagnosis not present

## 2021-12-03 DIAGNOSIS — N135 Crossing vessel and stricture of ureter without hydronephrosis: Secondary | ICD-10-CM | POA: Diagnosis not present

## 2021-12-04 ENCOUNTER — Encounter: Payer: Self-pay | Admitting: Family Medicine

## 2021-12-04 ENCOUNTER — Telehealth (INDEPENDENT_AMBULATORY_CARE_PROVIDER_SITE_OTHER): Payer: Medicare Other | Admitting: Family Medicine

## 2021-12-04 DIAGNOSIS — M545 Low back pain, unspecified: Secondary | ICD-10-CM

## 2021-12-04 DIAGNOSIS — F1190 Opioid use, unspecified, uncomplicated: Secondary | ICD-10-CM | POA: Diagnosis not present

## 2021-12-04 DIAGNOSIS — G8929 Other chronic pain: Secondary | ICD-10-CM | POA: Diagnosis not present

## 2021-12-04 MED ORDER — HYDROCODONE-ACETAMINOPHEN 5-325 MG PO TABS: 1.0000 | ORAL_TABLET | Freq: Four times a day (QID) | ORAL | 0 refills | Status: DC | PRN

## 2021-12-04 NOTE — Progress Notes (Signed)
° °  Subjective:    Patient ID: Deborah Bryant, female    DOB: Dec 26, 1945, 75 y.o.   MRN: [id number]  HPI Virtual Visit via Telephone Note  I connected with the patient on 12/04/21 at  3:45 PM EST by telephone and verified that I am speaking with the correct person using two identifiers.   I discussed the limitations, risks, security and privacy concerns of performing an evaluation and management service by telephone and the availability of in person appointments. I also discussed with the patient that there may be a patient responsible charge related to this service. The patient expressed understanding and agreed to proceed.  Location patient: home Location provider: work or home office Participants present for the call: patient, provider Patient did not have a visit in the prior 7 days to address this/these issue(s).   History of Present Illness: Here for pain management, she is doing well.    Observations/Objective: Patient sounds cheerful and well on the phone. I do not appreciate any SOB. Speech and thought processing are grossly intact. Patient reported vitals:  Assessment and Plan: Pain management. Indication for chronic opioid: low back pain Medication and dose: Norco 5-325 # pills per month: 120 Last UDS date: 08-27-21 Opioid Treatment Agreement signed (Y/N): 03-19-18 Opioid Treatment Agreement last reviewed with patient:  12-04-21 NCCSRS reviewed this encounter (include red flags): Yes Meds were refilled.  Alysia Penna, MD   Follow Up Instructions:     [phone] 5-10 [phone] 11-20 [phone] 21-30 I did not refer this patient for an OV in the next 24 hours for this/these issue(s).  I discussed the assessment and treatment plan with the patient. The patient was provided an opportunity to ask questions and all were answered. The patient agreed with the plan and demonstrated an understanding of the instructions.   The patient was advised to call back or seek an in-person  evaluation if the symptoms worsen or if the condition fails to improve as anticipated.  I provided 14 minutes of non-face-to-face time during this encounter.   Alysia Penna, MD     Review of Systems     Objective:   Physical Exam        Assessment & Plan:

## 2021-12-05 ENCOUNTER — Telehealth: Payer: Self-pay | Admitting: Family Medicine

## 2021-12-05 NOTE — Chronic Care Management (AMB) (Signed)
°  Chronic Care Management   Outreach Note  12/05/2021 Name: Deborah Bryant MRN: [id number] DOB: [birth year]  Referred by: Laurey Morale, MD Reason for referral : No chief complaint on file.   An unsuccessful telephone outreach was attempted today. The patient was referred to the pharmacist for assistance with care management and care coordination.   Follow Up Plan:   Tatjana Dellinger Upstream Scheduler

## 2021-12-06 NOTE — H&P (Signed)
CC/HPI: cc: gross hematuria   09/04/21: 75 year old woman comes in with 2 month history of intermittent gross hematuria. She did have UTI in mid August which is not typical for her. She is very nervous about the hematuria because her father had a history of bladder cancer and patient is a current smoker. She is trying very hard to quit and is down to 4 cigarettes per day. She feels like there is gross blood when she wipes but not in the toilet bowl. She denies any flank pain. She is followed by nephrologist for proteinuria. No evidence of microscopic hematuria on urinalysis today. She does have 3+ protein.   12/03/2021: 75 year old woman who presents today for preoperative appointment prior to undergoing a cystoscopy with biopsy and fulguration concerning area on her posterior bladder wall. 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 This suggested mild to moderate right-sided stenosis. Lab work indicates a GFR of 58 with a creatinine of 1.01. She is followed by nephrology due to proteinuria. She has multiple questions today associated with her Lasix renogram and neck steps on how to proceed. She denies any flank pain, changes to her voiding habits, fevers or chills. Her blood pressure is elevated today. She is slightly agitated and anxious today due to issues with her car today as well as concerns about upcoming surgery.     ALLERGIES: No Allergies    MEDICATIONS: Allopurinol 300 mg tablet  Aspirin Ec 81 mg tablet, delayed release  Atorvastatin Calcium 40 mg tablet  D3-2000 50 mcg (2,000 unit) capsule  Diclofenac Sodium 75 mg tablet, delayed release  Diltiazem 24Hr Er (La) 240 mg tablet, extended release 24 hr  Florastor  Glipizide 5 mg tablet  HCTZ  Hydrocodone-Acetaminophen 5 mg-325 mg tablet  Hydroxychloroquine Sulfate 200 mg tablet  Ibuprofen Pm 200 mg-25 mg capsule  Miralax 17 gram powder in packet  Pepcid  Valium     GU PSH: Cystoscopy -  10/18/2021 Locm 300-399Mg /Ml Iodine,1Ml - 10/05/2021     NON-GU PSH: Back surgery     GU PMH: Gross hematuria - 10/18/2021, - 10/05/2021, - 09/04/2021    NON-GU PMH: Arthritis Diabetes Type 2 Gout Hypertension    FAMILY HISTORY: Bladder Cancer - Runs in Family Heart Disease - Runs in Family Lung Cancer - Runs in Family   SOCIAL HISTORY: Marital Status: Married Preferred Language: English; Ethnicity: Not Hispanic Or Latino; Race: White Current Smoking Status: Patient smokes occasionally.   Tobacco Use Assessment Completed: Used Tobacco in last 30 days? Does not use smokeless tobacco. Has never drank.  Drinks 1 caffeinated drink per day.    REVIEW OF SYSTEMS:    GU Review Female:   Patient denies frequent urination, hard to postpone urination, burning /pain with urination, get up at night to urinate, leakage of urine, stream starts and stops, trouble starting your stream, have to strain to urinate, and being pregnant.  Gastrointestinal (Upper):   Patient denies nausea, vomiting, and indigestion/ heartburn.  Gastrointestinal (Lower):   Patient denies diarrhea and constipation.  Constitutional:   Patient denies fever, night sweats, and weight loss.  Skin:   Patient denies skin rash/ lesion and itching.  Eyes:   Patient denies double vision and blurred vision.  Ears/ Nose/ Throat:   Patient denies sore throat and sinus problems.  Hematologic/Lymphatic:   Patient denies swollen glands and easy bruising.  Cardiovascular:   Patient denies leg swelling and chest pains.  Respiratory:   Patient denies cough  and shortness of breath.  Endocrine:   Patient denies excessive thirst.  Musculoskeletal:   Patient denies back pain and joint pain.  Neurological:   Patient denies headaches and dizziness.  Psychologic:   Patient denies depression and anxiety.   VITAL SIGNS:      12/03/2021 02:03 PM  Weight 162 lb / 73.48 kg  Height 62 in / 157.48 cm  BP 191/70 mmHg  Pulse 57 /min   Temperature 97.5 F / 36.3 C  BMI 29.6 kg/m   MULTI-SYSTEM PHYSICAL EXAMINATION:    Constitutional: Well-nourished. No physical deformities. Normally developed. Good grooming.  Respiratory: No labored breathing, no use of accessory muscles.   Cardiovascular: Normal temperature, normal extremity pulses, no swelling, no varicosities.  Skin: No paleness, no jaundice, no cyanosis. No lesion, no ulcer, no rash.  Neurologic / Psychiatric: Oriented to time, oriented to place, oriented to person. No depression, no anxiety, no agitation.  Gastrointestinal: No mass, no tenderness, no rigidity, non obese abdomen.     Complexity of Data:  Source Of History:  Patient  Records Review:   Previous Patient Records  Urine Test Review:   Urinalysis  X-Ray Review: Renogram: Reviewed Report.     PROCEDURES:          Urinalysis w/Scope - 81001 Dipstick Dipstick Cont'd Micro  Color: Yellow Bilirubin: Neg WBC/hpf: 0 - 5/hpf  Appearance: Clear Ketones: Neg RBC/hpf: 0 - 2/hpf  Specific Gravity: 1.025 Blood: Neg Bacteria: Rare (0-9/hpf)  pH: 6.0 Protein: 3+ Cystals: NS (Not Seen)  Glucose: Neg Urobilinogen: 0.2 Casts: Hyaline    Nitrites: Neg Trichomonas: Not Present    Leukocyte Esterase: Neg Mucous: Not Present      Epithelial Cells: 0 - 5/hpf      Yeast: NS (Not Seen)      Sperm: Not Present    Notes:      ASSESSMENT:      ICD-10 Details  1 GU:   Gross hematuria - F79.0 Acute, Uncomplicated  2   Ureteral stricture - N13.5 Chronic, Exacerbation   PLAN:           Orders Labs CULTURE, URINE          Document Letter(s):  Created for Patient: Clinical Summary         Notes:   Urine was sent for precautionary culture today. Her questions were answered to the best of my ability. I did explain that I would discuss further recommendations regarding ureteral abnormality with her urologist. I advised that depending on her symptoms as well as severity, sometimes monitoring is recommended. She  will notify our office with any other concerns prior to your surgery.

## 2021-12-11 ENCOUNTER — Encounter (HOSPITAL_COMMUNITY): Payer: Self-pay | Admitting: Urology

## 2021-12-11 ENCOUNTER — Encounter (HOSPITAL_COMMUNITY)
Admission: RE | Disposition: A | Discharge status: Home / Self Care | Payer: Self-pay | Source: Ambulatory Visit | Attending: Urology

## 2021-12-11 ENCOUNTER — Ambulatory Visit (HOSPITAL_COMMUNITY)
Admission: RE | Admit: 2021-12-11 | Discharge: 2021-12-11 | Disposition: A | Discharge status: Home / Self Care | Payer: Medicare Other | Source: Ambulatory Visit | Attending: Urology | Admitting: Urology

## 2021-12-11 ENCOUNTER — Ambulatory Visit (HOSPITAL_COMMUNITY): Payer: Medicare Other | Admitting: Anesthesiology

## 2021-12-11 ENCOUNTER — Ambulatory Visit (HOSPITAL_COMMUNITY): Payer: Medicare Other | Admitting: Physician Assistant

## 2021-12-11 DIAGNOSIS — M1990 Unspecified osteoarthritis, unspecified site: Secondary | ICD-10-CM | POA: Insufficient documentation

## 2021-12-11 DIAGNOSIS — Z7984 Long term (current) use of oral hypoglycemic drugs: Secondary | ICD-10-CM | POA: Diagnosis not present

## 2021-12-11 DIAGNOSIS — F419 Anxiety disorder, unspecified: Secondary | ICD-10-CM | POA: Diagnosis not present

## 2021-12-11 DIAGNOSIS — N135 Crossing vessel and stricture of ureter without hydronephrosis: Secondary | ICD-10-CM | POA: Diagnosis not present

## 2021-12-11 DIAGNOSIS — J449 Chronic obstructive pulmonary disease, unspecified: Secondary | ICD-10-CM | POA: Diagnosis not present

## 2021-12-11 DIAGNOSIS — K219 Gastro-esophageal reflux disease without esophagitis: Secondary | ICD-10-CM | POA: Insufficient documentation

## 2021-12-11 DIAGNOSIS — I129 Hypertensive chronic kidney disease with stage 1 through stage 4 chronic kidney disease, or unspecified chronic kidney disease: Secondary | ICD-10-CM | POA: Diagnosis not present

## 2021-12-11 DIAGNOSIS — Z79899 Other long term (current) drug therapy: Secondary | ICD-10-CM | POA: Insufficient documentation

## 2021-12-11 DIAGNOSIS — F17210 Nicotine dependence, cigarettes, uncomplicated: Secondary | ICD-10-CM | POA: Insufficient documentation

## 2021-12-11 DIAGNOSIS — N329 Bladder disorder, unspecified: Secondary | ICD-10-CM | POA: Diagnosis not present

## 2021-12-11 DIAGNOSIS — D303 Benign neoplasm of bladder: Secondary | ICD-10-CM | POA: Diagnosis not present

## 2021-12-11 DIAGNOSIS — N189 Chronic kidney disease, unspecified: Secondary | ICD-10-CM | POA: Diagnosis not present

## 2021-12-11 DIAGNOSIS — E039 Hypothyroidism, unspecified: Secondary | ICD-10-CM | POA: Insufficient documentation

## 2021-12-11 DIAGNOSIS — E1122 Type 2 diabetes mellitus with diabetic chronic kidney disease: Secondary | ICD-10-CM | POA: Diagnosis not present

## 2021-12-11 DIAGNOSIS — N3289 Other specified disorders of bladder: Secondary | ICD-10-CM | POA: Diagnosis not present

## 2021-12-11 HISTORY — PX: CYSTOSCOPY WITH FULGERATION: SHX6638

## 2021-12-11 HISTORY — PX: CYSTOSCOPY WITH BIOPSY: SHX5122

## 2021-12-11 LAB — GLUCOSE, CAPILLARY
Glucose-Capillary: 125 mg/dL — ABNORMAL HIGH (ref 70–99)
Glucose-Capillary: 119 mg/dL — ABNORMAL HIGH (ref 70–99)

## 2021-12-11 SURGERY — CYSTOSCOPY, WITH BIOPSY
Anesthesia: General | Site: Urethra

## 2021-12-11 MED ORDER — FENTANYL CITRATE (PF) 100 MCG/2ML IJ SOLN
INTRAMUSCULAR | Status: DC | PRN
  Administered 2021-12-11: 50 ug via INTRAVENOUS

## 2021-12-11 MED ORDER — FENTANYL CITRATE PF 50 MCG/ML IJ SOSY: 25.0000 ug | PREFILLED_SYRINGE | INTRAMUSCULAR | Status: DC | PRN

## 2021-12-11 MED ORDER — ACETAMINOPHEN 160 MG/5ML PO SOLN: 1000.0000 mg | Freq: Once | ORAL | Status: DC | PRN

## 2021-12-11 MED ORDER — DEXMEDETOMIDINE (PRECEDEX) IN NS 20 MCG/5ML (4 MCG/ML) IV SYRINGE
PREFILLED_SYRINGE | INTRAVENOUS | Status: DC | PRN
  Administered 2021-12-11: 2 ug via INTRAVENOUS
  Administered 2021-12-11: 4 ug via INTRAVENOUS

## 2021-12-11 MED ORDER — LACTATED RINGERS IV SOLN: INTRAVENOUS | Status: DC

## 2021-12-11 MED ORDER — DEXAMETHASONE SODIUM PHOSPHATE 10 MG/ML IJ SOLN
INTRAMUSCULAR | Status: DC | PRN
  Administered 2021-12-11: 5 mg via INTRAVENOUS

## 2021-12-11 MED ORDER — ONDANSETRON HCL 4 MG/2ML IJ SOLN
INTRAMUSCULAR | Status: DC | PRN
  Administered 2021-12-11: 4 mg via INTRAVENOUS

## 2021-12-11 MED ORDER — FENTANYL CITRATE (PF) 100 MCG/2ML IJ SOLN
INTRAMUSCULAR | Status: AC
  Filled 2021-12-11: qty 2

## 2021-12-11 MED ORDER — PROPOFOL 10 MG/ML IV BOLUS
INTRAVENOUS | Status: AC
  Filled 2021-12-11: qty 20

## 2021-12-11 MED ORDER — ACETAMINOPHEN 10 MG/ML IV SOLN: 1000.0000 mg | Freq: Once | INTRAVENOUS | Status: DC | PRN

## 2021-12-11 MED ORDER — ACETAMINOPHEN 500 MG PO TABS: 1000.0000 mg | ORAL_TABLET | Freq: Once | ORAL | Status: DC | PRN

## 2021-12-11 MED ORDER — PROPOFOL 500 MG/50ML IV EMUL
INTRAVENOUS | Status: DC | PRN
  Administered 2021-12-11: 150 ug/kg/min via INTRAVENOUS

## 2021-12-11 MED ORDER — PROPOFOL 10 MG/ML IV BOLUS
INTRAVENOUS | Status: DC | PRN
  Administered 2021-12-11 (×3): 20 mg via INTRAVENOUS

## 2021-12-11 MED ORDER — CEFAZOLIN SODIUM-DEXTROSE 2-4 GM/100ML-% IV SOLN
2.0000 g | INTRAVENOUS | Status: AC
  Administered 2021-12-11: 13:00:00 2 g via INTRAVENOUS
  Filled 2021-12-11: qty 100

## 2021-12-11 MED ORDER — CHLORHEXIDINE GLUCONATE 0.12 % MT SOLN
15.0000 mL | Freq: Once | OROMUCOSAL | Status: AC
  Administered 2021-12-11: 11:00:00 15 mL via OROMUCOSAL

## 2021-12-11 MED ORDER — ORAL CARE MOUTH RINSE: 15.0000 mL | Freq: Once | OROMUCOSAL | Status: AC

## 2021-12-11 MED ORDER — SODIUM CHLORIDE 0.9 % IR SOLN
Status: DC | PRN
  Administered 2021-12-11: 3000 mL via INTRAVESICAL

## 2021-12-11 SURGICAL SUPPLY — 18 items
BAG URINE DRAIN 2000ML AR STRL (UROLOGICAL SUPPLIES) IMPLANT
BAG URO CATCHER STRL LF (MISCELLANEOUS) ×3 IMPLANT
CLOTH BEACON ORANGE TIMEOUT ST (SAFETY) ×3 IMPLANT
DRAPE FOOT SWITCH (DRAPES) ×3 IMPLANT
ELECT COAG BIPOLAR CYL 1.2MMM (ELECTROSURGICAL)
ELECT REM PT RETURN 15FT ADLT (MISCELLANEOUS) ×3 IMPLANT
ELECTRODE COAG BIPLR CYL 1.2MM (ELECTROSURGICAL) IMPLANT
GLOVE SURG ENC MOIS LTX SZ6.5 (GLOVE) ×3 IMPLANT
GOWN STRL REUS W/TWL LRG LVL3 (GOWN DISPOSABLE) ×3 IMPLANT
KIT TURNOVER KIT A (KITS) ×2 IMPLANT
LOOP CUT BIPOLAR 24F LRG (ELECTROSURGICAL) ×2 IMPLANT
MANIFOLD NEPTUNE II (INSTRUMENTS) ×3 IMPLANT
PACK CYSTO (CUSTOM PROCEDURE TRAY) ×3 IMPLANT
SYR TOOMEY IRRIG 70ML (MISCELLANEOUS)
SYRINGE TOOMEY IRRIG 70ML (MISCELLANEOUS) IMPLANT
TUBING CONNECTING 10 (TUBING) ×2 IMPLANT
TUBING CONNECTING 10' (TUBING) ×1
TUBING UROLOGY SET (TUBING) ×2 IMPLANT

## 2021-12-11 NOTE — Interval H&P Note (Signed)
History and Physical Interval Note:  12/11/2021 12:23 PM  Deborah Bryant  has presented today for surgery, with the diagnosis of BLADDER LESION.  The various methods of treatment have been discussed with the patient and family. After consideration of risks, benefits and other options for treatment, the patient has consented to  Procedure(s): CYSTOSCOPY WITH BIOPSY (N/A) CYSTOSCOPY WITH FULGERATION (N/A) as a surgical intervention.  The patient's history has been reviewed, patient examined, no change in status, stable for surgery.  I have reviewed the patient's chart and labs.  Questions were answered to the patient's satisfaction.     Vanecia Limpert D Jimesha Rising

## 2021-12-11 NOTE — Anesthesia Procedure Notes (Signed)
Procedure Name: LMA Insertion Date/Time: 12/11/2021 12:44 PM Performed by: Gean Maidens, CRNA Pre-anesthesia Checklist: Patient identified, Emergency Drugs available, Suction available, Patient being monitored and Timeout performed Patient Re-evaluated:Patient Re-evaluated prior to induction Oxygen Delivery Method: Circle system utilized Preoxygenation: Pre-oxygenation with 100% oxygen Induction Type: IV induction Ventilation: Mask ventilation without difficulty LMA: LMA inserted LMA Size: 4.0 Number of attempts: 1 Placement Confirmation: positive ETCO2 and breath sounds checked- equal and bilateral Tube secured with: Tape Dental Injury: Teeth and Oropharynx as per pre-operative assessment

## 2021-12-11 NOTE — Op Note (Signed)
Operative Note  Preoperative diagnosis:  1.  Bladder lesion  Postoperative diagnosis: 1.  Bladder lesion  Procedure(s): 1.  Cystoscopy, bladder biopsy, fulguration 2.  Urethral dilation  Surgeon: Jacalyn Lefevre, MD  Assistants:  None  Anesthesia:  General  Complications:  None  EBL:  minimal  Specimens: 1. Right superior lateral wall bladder biopsy 2. Dome bladder biopsy  Drains/Catheters: 1.  none  Intraoperative findings:   Normal anterior urethra Bilateral orthotopic ureteral orifices  Small erythematous flat lesion on right superior bladder wall and dome   Indication:  Deborah Bryant is a 75 y.o. female with intermittent gross hematuria found to have bladder lesion on diagnostic cystoscopy.  Description of procedure:  After risks and benefits the procedure discussed with the patient in detail informed consent was obtained.  Patient then taken the operating placed in supine position.  Anesthesia was induced antibiotics administered.  Patient was repositioned in the dorsolithotomy position she was prepped and draped in usual sterile fashion.  Timeouts performed.  Urethral dilation took place from 20Fr to 26Fr.  Next, a 80 French rigid cystoscope was placed in the urethral meatus and advanced into the bladder under direct visualization.   Inspection of the bladder revealed a small red area on right superior lateral wall.  The previously seen area on inferior posterior wall was no longer present.  Another slightly abnormal area was seen on the dome.  A cold cup bladder biopsy was then taken from each of those areas.   The bipolar loop was then used to fulgurate both of these areas.  Hemostasis was adequate with the irrigant turned off.  The patient's bladder was decompressed and the resectoscope was removed.  The patient was awakened from anesthesia and transferred the PACU in stable condition.    Plan: Follow-up in 1 week for pathology results

## 2021-12-11 NOTE — Anesthesia Postprocedure Evaluation (Signed)
Anesthesia Post Note  Patient: MEGAN PRESTI  Procedure(s) Performed: CYSTOSCOPY WITH BIOPSY (Urethra) CYSTOSCOPY WITH FULGERATION (Urethra)     Patient location during evaluation: PACU Anesthesia Type: General Level of consciousness: awake and alert Pain management: pain level controlled Vital Signs Assessment: post-procedure vital signs reviewed and stable Respiratory status: spontaneous breathing, nonlabored ventilation, respiratory function stable and patient connected to nasal cannula oxygen Cardiovascular status: blood pressure returned to baseline and stable Postop Assessment: no apparent nausea or vomiting Anesthetic complications: no   No notable events documented.  Last Vitals:  Vitals:   12/11/21 1305 12/11/21 1313  BP: 135/60   Pulse: (!) 51 (!) 54  Resp: 16 17  Temp:    SpO2: 100% 94%    Last Pain:  Vitals:   12/11/21 1303  TempSrc:   PainSc: 0-No pain                 Barnet Glasgow

## 2021-12-11 NOTE — Discharge Instructions (Signed)

## 2021-12-11 NOTE — Transfer of Care (Signed)
Immediate Anesthesia Transfer of Care Note  Patient: Deborah Bryant  Procedure(s) Performed: CYSTOSCOPY WITH BIOPSY (Urethra) CYSTOSCOPY WITH FULGERATION (Urethra)  Patient Location: PACU  Anesthesia Type:General  Level of Consciousness: awake, alert  and oriented  Airway & Oxygen Therapy: Patient Spontanous Breathing and Patient connected to face mask oxygen  Post-op Assessment: Report given to RN and Post -op Vital signs reviewed and stable  Post vital signs: Reviewed and stable  Last Vitals:  Vitals Value Taken Time  BP 135/60 12/11/21 1303  Temp    Pulse 51 12/11/21 1305  Resp 16 12/11/21 1305  SpO2 100 % 12/11/21 1305  Vitals shown include unvalidated device data.  Last Pain:  Vitals:   12/11/21 1040  TempSrc:   PainSc: 3       Patients Stated Pain Goal: 2 (12/87/86 7672)  Complications: No notable events documented.

## 2021-12-11 NOTE — Anesthesia Preprocedure Evaluation (Addendum)
Anesthesia Evaluation  Patient identified by MRN, date of birth, ID band Patient awake    Reviewed: Allergy & Precautions, NPO status , Patient's Chart, lab work & pertinent test results  History of Anesthesia Complications (+) PONV and history of anesthetic complications  Airway Mallampati: II  TM Distance: >3 FB Neck ROM: Full    Dental  (+) Teeth Intact, Dental Advisory Given,    Pulmonary shortness of breath, COPD, Current Smoker and Patient abstained from smoking.,    breath sounds clear to auscultation       Cardiovascular hypertension, Pt. on medications (-) angina(-) Past MI and (-) CHF  Rhythm:Regular     Neuro/Psych PSYCHIATRIC DISORDERS Anxiety negative neurological ROS     GI/Hepatic Neg liver ROS, GERD  Medicated and Controlled,  Endo/Other  diabetes, Type 2, Oral Hypoglycemic AgentsHypothyroidism   Renal/GU CRFRenal diseaseLab Results      Component                Value               Date                      CREATININE               1.01 (H)            11/27/2021                Musculoskeletal  (+) Arthritis ,   Abdominal   Peds  Hematology negative hematology ROS (+) Lab Results      Component                Value               Date                      WBC                      11.7 (H)            11/27/2021                HGB                      14.4                11/27/2021                HCT                      43.9                11/27/2021                MCV                      89.6                11/27/2021                PLT                      330                 11/27/2021              Anesthesia Other Findings   Reproductive/Obstetrics  Anesthesia Physical Anesthesia Plan  ASA: 2  Anesthesia Plan: General   Post-op Pain Management: Tylenol PO (pre-op)   Induction: Intravenous  PONV Risk Score and Plan: 3 and Ondansetron,  Dexamethasone, TIVA and Propofol infusion  Airway Management Planned: LMA  Additional Equipment: None  Intra-op Plan:   Post-operative Plan: Extubation in OR  Informed Consent: I have reviewed the patients History and Physical, chart, labs and discussed the procedure including the risks, benefits and alternatives for the proposed anesthesia with the patient or authorized representative who has indicated his/her understanding and acceptance.     Dental advisory given  Plan Discussed with: CRNA and Anesthesiologist  Anesthesia Plan Comments:        Anesthesia Quick Evaluation

## 2021-12-12 ENCOUNTER — Encounter (HOSPITAL_COMMUNITY): Payer: Self-pay | Admitting: Urology

## 2021-12-12 LAB — SURGICAL PATHOLOGY

## 2021-12-13 ENCOUNTER — Telehealth: Payer: Self-pay | Admitting: Family Medicine

## 2021-12-13 NOTE — Progress Notes (Signed)
°  Chronic Care Management   Outreach Note  12/13/2021 Name: Deborah Bryant MRN: [id number] DOB: 1946/07/07  Referred by: Laurey Morale, MD Reason for referral : No chief complaint on file.   A second unsuccessful telephone outreach was attempted today. The patient was referred to pharmacist for assistance with care management and care coordination.  Follow Up Plan:   Tatjana Dellinger Upstream Scheduler

## 2021-12-21 DIAGNOSIS — N135 Crossing vessel and stricture of ureter without hydronephrosis: Secondary | ICD-10-CM | POA: Diagnosis not present

## 2021-12-21 DIAGNOSIS — R310 Gross hematuria: Secondary | ICD-10-CM | POA: Diagnosis not present

## 2021-12-24 ENCOUNTER — Telehealth: Payer: Self-pay | Admitting: Family Medicine

## 2021-12-24 NOTE — Telephone Encounter (Signed)
Left message for patient to call back and schedule Medicare Annual Wellness Visit (AWV) either virtually or in office. Left  my Herbie Drape number [phone]   awvi per [facility] 08/16/12 please schedule at anytime with [facility] Nurse Health Advisor 1 or 2   This should be a 45 minute visit.

## 2022-01-10 ENCOUNTER — Telehealth: Payer: Self-pay | Admitting: Family Medicine

## 2022-01-10 NOTE — Chronic Care Management (AMB) (Signed)
°  Chronic Care Management   Note  01/10/2022 Name: Deborah Bryant MRN: [id number] DOB: [birth year]  Deborah Bryant is a 76 y.o. year old female who is a primary care patient of Laurey Morale, MD. I reached out to [name] by phone today in response to a referral sent by [name]'s PCP, Laurey Morale, MD.   [name] was given information about Chronic Care Management services today including:  CCM service includes personalized support from designated clinical staff supervised by her physician, including individualized plan of care and coordination with other care providers 24/7 contact phone numbers for assistance for urgent and routine care needs. Service will only be billed when office clinical staff spend 20 minutes or more in a month to coordinate care. Only one practitioner may furnish and bill the service in a calendar month. The patient may stop CCM services at any time (effective at the end of the month) by phone call to the office staff.   Patient did not agree to enrollment in care management services and does not wish to consider at this time.  [place]

## 2022-01-21 DIAGNOSIS — M064 Inflammatory polyarthropathy: Secondary | ICD-10-CM | POA: Diagnosis not present

## 2022-01-21 DIAGNOSIS — M150 Primary generalized (osteo)arthritis: Secondary | ICD-10-CM | POA: Diagnosis not present

## 2022-01-21 DIAGNOSIS — Z79899 Other long term (current) drug therapy: Secondary | ICD-10-CM | POA: Diagnosis not present

## 2022-01-21 DIAGNOSIS — M1009 Idiopathic gout, multiple sites: Secondary | ICD-10-CM | POA: Diagnosis not present

## 2022-01-21 DIAGNOSIS — E663 Overweight: Secondary | ICD-10-CM | POA: Diagnosis not present

## 2022-01-21 DIAGNOSIS — Z6828 Body mass index (BMI) 28.0-28.9, adult: Secondary | ICD-10-CM | POA: Diagnosis not present

## 2022-02-04 ENCOUNTER — Telehealth: Payer: Self-pay | Admitting: Family Medicine

## 2022-02-04 NOTE — Telephone Encounter (Signed)
Spoke with patient to schedule Medicare Annual Wellness Visit (AWV) either virtually or in office.  Patient declined stating she only wanted to Dr Jannette Spanner per [facility] 08/16/12  please schedule at anytime with [facility] [place] 1 or 2   This should be a 45 minute visit.

## 2022-02-27 ENCOUNTER — Telehealth (INDEPENDENT_AMBULATORY_CARE_PROVIDER_SITE_OTHER): Payer: Medicare Other | Admitting: Family Medicine

## 2022-02-27 ENCOUNTER — Encounter: Payer: Self-pay | Admitting: Family Medicine

## 2022-02-27 DIAGNOSIS — M545 Low back pain, unspecified: Secondary | ICD-10-CM

## 2022-02-27 DIAGNOSIS — M1380 Other specified arthritis, unspecified site: Secondary | ICD-10-CM | POA: Insufficient documentation

## 2022-02-27 DIAGNOSIS — G8929 Other chronic pain: Secondary | ICD-10-CM | POA: Diagnosis not present

## 2022-02-27 DIAGNOSIS — F1190 Opioid use, unspecified, uncomplicated: Secondary | ICD-10-CM | POA: Diagnosis not present

## 2022-02-27 MED ORDER — HYDROCODONE-ACETAMINOPHEN 5-325 MG PO TABS: 1.0000 | ORAL_TABLET | Freq: Four times a day (QID) | ORAL | 0 refills | Status: DC | PRN

## 2022-02-27 NOTE — Progress Notes (Signed)
? ?  Subjective:  ? ? Patient ID: Deborah Bryant, female    DOB: 1946-11-14, 76 y.o.   MRN: [id number] ? ?HPI ?Virtual Visit via Telephone Note ? ?I connected with the patient on 02/27/22 at  3:45 PM EDT by telephone and verified that I am speaking with the correct person using two identifiers. ?  ?I discussed the limitations, risks, security and privacy concerns of performing an evaluation and management service by telephone and the availability of in person appointments. I also discussed with the patient that there may be a patient responsible charge related to this service. The patient expressed understanding and agreed to proceed. ? ?Location patient: home ?Location provider: work or home office ?Participants present for the call: patient, provider ?Patient did not have a visit in the prior 7 days to address this/these issue(s). ? ? ?History of Present Illness: ?Here for pain management. She is doing about the same. She recently saw her rheumatologist, Dr. Trudie Reed, and she felt she was doing well. ?  ?Observations/Objective: ?Patient sounds cheerful and well on the phone. ?I do not appreciate any SOB. ?Speech and thought processing are grossly intact. ?Patient reported vitals: ? ?Assessment and Plan: ?Pain management. ?Indication for chronic opioid: low back pain ?Medication and dose: Norco 5-325 ?# pills per month: 120 ?Last UDS date: 08-27-21 ?Opioid Treatment Agreement signed (Y/N): 03-19-18 ?Opioid Treatment Agreement last reviewed with patient:  02-27-22 ?[facility] reviewed this encounter (include red flags): Yes ?Meds were refilled. ?Alysia Penna, MD ? ? ?Follow Up Instructions: ? ? ? ? ?58099 5-10 ?99442 11-20 ?9443 21-30 ?I did not refer this patient for an OV in the next 24 hours for this/these issue(s). ? ?I discussed the assessment and treatment plan with the patient. The patient was provided an opportunity to ask questions and all were answered. The patient agreed with the plan and demonstrated an understanding  of the instructions. ?  ?The patient was advised to call back or seek an in-person evaluation if the symptoms worsen or if the condition fails to improve as anticipated. ? ?I provided 14 minutes of non-face-to-face time during this encounter. ? ? ?Alysia Penna, MD   ? ? ?Review of Systems ? ?   ?Objective:  ? Physical Exam ? ? ? ? ?   ?Assessment & Plan:  ? ? ?

## 2022-03-08 DIAGNOSIS — R310 Gross hematuria: Secondary | ICD-10-CM | POA: Diagnosis not present

## 2022-05-27 DIAGNOSIS — E118 Type 2 diabetes mellitus with unspecified complications: Secondary | ICD-10-CM | POA: Diagnosis not present

## 2022-05-27 DIAGNOSIS — M1009 Idiopathic gout, multiple sites: Secondary | ICD-10-CM | POA: Diagnosis not present

## 2022-05-27 DIAGNOSIS — R5383 Other fatigue: Secondary | ICD-10-CM | POA: Diagnosis not present

## 2022-05-27 DIAGNOSIS — Z6829 Body mass index (BMI) 29.0-29.9, adult: Secondary | ICD-10-CM | POA: Diagnosis not present

## 2022-05-27 DIAGNOSIS — M064 Inflammatory polyarthropathy: Secondary | ICD-10-CM | POA: Diagnosis not present

## 2022-05-27 DIAGNOSIS — E663 Overweight: Secondary | ICD-10-CM | POA: Diagnosis not present

## 2022-05-27 DIAGNOSIS — Z79899 Other long term (current) drug therapy: Secondary | ICD-10-CM | POA: Diagnosis not present

## 2022-05-27 DIAGNOSIS — M1991 Primary osteoarthritis, unspecified site: Secondary | ICD-10-CM | POA: Diagnosis not present

## 2022-05-28 ENCOUNTER — Encounter: Payer: Self-pay | Admitting: Family Medicine

## 2022-05-28 ENCOUNTER — Telehealth (INDEPENDENT_AMBULATORY_CARE_PROVIDER_SITE_OTHER): Payer: Medicare Other | Admitting: Family Medicine

## 2022-05-28 DIAGNOSIS — G8929 Other chronic pain: Secondary | ICD-10-CM

## 2022-05-28 DIAGNOSIS — M545 Low back pain, unspecified: Secondary | ICD-10-CM

## 2022-05-28 DIAGNOSIS — F1190 Opioid use, unspecified, uncomplicated: Secondary | ICD-10-CM

## 2022-05-28 MED ORDER — HYDROCODONE-ACETAMINOPHEN 5-325 MG PO TABS: 1.0000 | ORAL_TABLET | Freq: Four times a day (QID) | ORAL | 0 refills | Status: DC | PRN

## 2022-05-28 NOTE — Progress Notes (Signed)
   Subjective:    Patient ID: Deborah Bryant, female    DOB: [birth year], 76 y.o.   MRN: [id number]  HPI Virtual Visit via Telephone Note  I connected with the patient on 05/28/22 at  1:00 PM EDT by telephone and verified that I am speaking with the correct person using two identifiers.   I discussed the limitations, risks, security and privacy concerns of performing an evaluation and management service by telephone and the availability of in person appointments. I also discussed with the patient that there may be a patient responsible charge related to this service. The patient expressed understanding and agreed to proceed.  Location patient: home Location provider: work or home office Participants present for the call: patient, provider Patient did not have a visit in the prior 7 days to address this/these issue(s).   History of Present Illness: Here for pain management. She is doing fairly well. She recently saw Dr. Trudie Reed, her rheumatologist, and she wants Deborah Bryant to stay on the same dose of Plaquenil.    Observations/Objective: Patient sounds cheerful and well on the phone. I do not appreciate any SOB. Speech and thought processing are grossly intact. Patient reported vitals:  Assessment and Plan: Pain management. Indication for chronic opioid: low back pain Medication and dose: Norco 5-325 # pills per month: 120 Last UDS date: 08-27-21 Opioid Treatment Agreement signed (Y/N): 03-19-18 Opioid Treatment Agreement last reviewed with patient:  05-28-22 [place] reviewed this encounter (include red flags): Yes Meds were refilled. Alysia Penna, MD   Follow Up Instructions:     [id number] 5-10 [phone] 11-20 9443 21-30 I did not refer this patient for an OV in the next 24 hours for this/these issue(s).  I discussed the assessment and treatment plan with the patient. The patient was provided an opportunity to ask questions and all were answered. The patient agreed with the plan and  demonstrated an understanding of the instructions.   The patient was advised to call back or seek an in-person evaluation if the symptoms worsen or if the condition fails to improve as anticipated.  I provided 16 minutes of non-face-to-face time during this encounter.   Alysia Penna, MD     Review of Systems     Objective:   Physical Exam        Assessment & Plan:

## 2022-05-29 NOTE — Telephone Encounter (Signed)
I can change Tom's orders to have today's date. However I DID send in Mashonda's refills for 7-1, 8-1, and 9-1 as seen in her chart

## 2022-08-16 DIAGNOSIS — Q6211 Congenital occlusion of ureteropelvic junction: Secondary | ICD-10-CM | POA: Diagnosis not present

## 2022-08-16 DIAGNOSIS — N952 Postmenopausal atrophic vaginitis: Secondary | ICD-10-CM | POA: Diagnosis not present

## 2022-08-20 ENCOUNTER — Telehealth: Payer: Self-pay | Admitting: *Deleted

## 2022-08-20 NOTE — Patient Outreach (Signed)
  Care Coordination   08/20/2022 Name: Deborah Bryant MRN: [id number] DOB: 07/20/1946   Care Coordination Outreach Attempts:  An unsuccessful telephone outreach was attempted today to offer the patient information about available care coordination services as a benefit of their health plan. Pt did not wish to verified HIPAA and indicated she ad an appointment next week with her provider and would discuss appointments with her provider at that time.  Follow Up Plan:  No further outreach attempts will be made at this time. We have been unable to contact the patient to offer or enroll patient in care coordination services  Encounter Outcome:  Pt. Refused  Care Coordination Interventions Activated:  No   Care Coordination Interventions:  No, not indicated    Raina Mina, RN Care Management Coordinator [place] [facility] [phone]

## 2022-08-28 ENCOUNTER — Ambulatory Visit (INDEPENDENT_AMBULATORY_CARE_PROVIDER_SITE_OTHER): Payer: Medicare Other | Admitting: Family Medicine

## 2022-08-28 ENCOUNTER — Encounter: Payer: Self-pay | Admitting: Family Medicine

## 2022-08-28 VITALS — BP 132/74 | HR 59 | Temp 97.8°F | Ht 62.0 in | Wt 175.0 lb

## 2022-08-28 DIAGNOSIS — N182 Chronic kidney disease, stage 2 (mild): Secondary | ICD-10-CM | POA: Diagnosis not present

## 2022-08-28 DIAGNOSIS — I10 Essential (primary) hypertension: Secondary | ICD-10-CM | POA: Diagnosis not present

## 2022-08-28 DIAGNOSIS — M1380 Other specified arthritis, unspecified site: Secondary | ICD-10-CM | POA: Diagnosis not present

## 2022-08-28 DIAGNOSIS — M1000 Idiopathic gout, unspecified site: Secondary | ICD-10-CM | POA: Diagnosis not present

## 2022-08-28 DIAGNOSIS — Z23 Encounter for immunization: Secondary | ICD-10-CM | POA: Diagnosis not present

## 2022-08-28 DIAGNOSIS — D649 Anemia, unspecified: Secondary | ICD-10-CM | POA: Diagnosis not present

## 2022-08-28 DIAGNOSIS — E039 Hypothyroidism, unspecified: Secondary | ICD-10-CM

## 2022-08-28 DIAGNOSIS — M545 Low back pain, unspecified: Secondary | ICD-10-CM

## 2022-08-28 DIAGNOSIS — E119 Type 2 diabetes mellitus without complications: Secondary | ICD-10-CM | POA: Diagnosis not present

## 2022-08-28 DIAGNOSIS — F1190 Opioid use, unspecified, uncomplicated: Secondary | ICD-10-CM

## 2022-08-28 DIAGNOSIS — E782 Mixed hyperlipidemia: Secondary | ICD-10-CM

## 2022-08-28 DIAGNOSIS — K589 Irritable bowel syndrome without diarrhea: Secondary | ICD-10-CM

## 2022-08-28 DIAGNOSIS — E559 Vitamin D deficiency, unspecified: Secondary | ICD-10-CM

## 2022-08-28 DIAGNOSIS — G8929 Other chronic pain: Secondary | ICD-10-CM

## 2022-08-28 LAB — CBC WITH DIFFERENTIAL/PLATELET
Basophils Absolute: 0.1 10*3/uL (ref 0.0–0.1)
Basophils Relative: 1.1 % (ref 0.0–3.0)
Eosinophils Absolute: 0.1 10*3/uL (ref 0.0–0.7)
Eosinophils Relative: 1 % (ref 0.0–5.0)
HCT: 42.8 % (ref 36.0–46.0)
Hemoglobin: 14.1 g/dL (ref 12.0–15.0)
Lymphocytes Relative: 8.5 % — ABNORMAL LOW (ref 12.0–46.0)
Lymphs Abs: 0.8 10*3/uL (ref 0.7–4.0)
MCHC: 33 g/dL (ref 30.0–36.0)
MCV: 88.8 fl (ref 78.0–100.0)
Monocytes Absolute: 0.7 10*3/uL (ref 0.1–1.0)
Monocytes Relative: 7.1 % (ref 3.0–12.0)
Neutro Abs: 8 10*3/uL — ABNORMAL HIGH (ref 1.4–7.7)
Neutrophils Relative %: 82.3 % — ABNORMAL HIGH (ref 43.0–77.0)
Platelets: 327 10*3/uL (ref 150.0–400.0)
RBC: 4.82 Mil/uL (ref 3.87–5.11)
RDW: 14.4 % (ref 11.5–15.5)
WBC: 9.7 10*3/uL (ref 4.0–10.5)

## 2022-08-28 LAB — BASIC METABOLIC PANEL
BUN: 15 mg/dL (ref 6–23)
CO2: 27 mEq/L (ref 19–32)
Calcium: 9.7 mg/dL (ref 8.4–10.5)
Chloride: 95 mEq/L — ABNORMAL LOW (ref 96–112)
Creatinine, Ser: 0.8 mg/dL (ref 0.40–1.20)
GFR: 71.77 mL/min (ref >60.00)
Glucose, Bld: 110 mg/dL — ABNORMAL HIGH (ref 70–99)
Potassium: 3.5 mEq/L (ref 3.5–5.1)
Sodium: 133 mEq/L — ABNORMAL LOW (ref 135–145)

## 2022-08-28 LAB — LIPID PANEL
Cholesterol: 152 mg/dL (ref 0–200)
HDL: 89.6 mg/dL (ref >39.00)
LDL Cholesterol: 50 mg/dL (ref 0–99)
NonHDL: 62.41
Total CHOL/HDL Ratio: 2
Triglycerides: 64 mg/dL (ref 0.0–149.0)
VLDL: 12.8 mg/dL (ref 0.0–40.0)

## 2022-08-28 LAB — HEPATIC FUNCTION PANEL
ALT: 13 U/L (ref 0–35)
AST: 19 U/L (ref 0–37)
Albumin: 4.2 g/dL (ref 3.5–5.2)
Alkaline Phosphatase: 140 U/L — ABNORMAL HIGH (ref 39–117)
Bilirubin, Direct: 0.1 mg/dL (ref 0.0–0.3)
Total Bilirubin: 0.4 mg/dL (ref 0.2–1.2)
Total Protein: 7.5 g/dL (ref 6.0–8.3)

## 2022-08-28 LAB — HEMOGLOBIN A1C: Hgb A1c MFr Bld: 6.4 % (ref 4.6–6.5)

## 2022-08-28 LAB — C-REACTIVE PROTEIN: CRP: <1 mg/dL (ref 0.5–20.0)

## 2022-08-28 LAB — MICROALBUMIN / CREATININE URINE RATIO
Creatinine,U: 58.2 mg/dL
Microalb Creat Ratio: 145.9 mg/g — ABNORMAL HIGH (ref 0.0–30.0)
Microalb, Ur: 84.9 mg/dL — ABNORMAL HIGH (ref 0.0–1.9)

## 2022-08-28 LAB — VITAMIN D 25 HYDROXY (VIT D DEFICIENCY, FRACTURES): VITD: 27.33 ng/mL — ABNORMAL LOW (ref 30.00–100.00)

## 2022-08-28 LAB — URIC ACID: Uric Acid, Serum: 4.5 mg/dL (ref 2.4–7.0)

## 2022-08-28 LAB — TSH: TSH: 2.22 u[IU]/mL (ref 0.35–5.50)

## 2022-08-28 MED ORDER — GLIPIZIDE ER 5 MG PO TB24
5.0000 mg | ORAL_TABLET | Freq: Every day | ORAL | 3 refills | Status: DC
Start: 2022-08-28 — End: 2023-08-19

## 2022-08-28 MED ORDER — HYDROCHLOROTHIAZIDE 25 MG PO TABS: 25.0000 mg | ORAL_TABLET | Freq: Every day | ORAL | 3 refills | Status: DC

## 2022-08-28 MED ORDER — DIAZEPAM 5 MG PO TABS: 5.0000 mg | ORAL_TABLET | Freq: Three times a day (TID) | ORAL | 5 refills | Status: DC | PRN

## 2022-08-28 MED ORDER — DILTIAZEM HCL ER BEADS 240 MG PO CP24: 240.0000 mg | ORAL_CAPSULE | Freq: Every day | ORAL | 3 refills | Status: DC

## 2022-08-28 MED ORDER — ALLOPURINOL 300 MG PO TABS: 300.0000 mg | ORAL_TABLET | Freq: Every day | ORAL | 3 refills | Status: DC

## 2022-08-28 MED ORDER — ATORVASTATIN CALCIUM 40 MG PO TABS: 40.0000 mg | ORAL_TABLET | Freq: Every day | ORAL | 3 refills | Status: DC

## 2022-08-28 NOTE — Progress Notes (Signed)
Subjective:    Patient ID: Deborah Bryant, female    DOB: 1946/08/29, 76 y.o.   MRN: [id number]  HPI Here to follow up on issues. Her main complaint today is her diffuse joint pains. She has a combination of OA and RA. She saw Dr. Trudie Reed, her rheumatologist, in June and she decided to keep [name] on her current regimen of Hydroxychloroquine. She is also enrolled in a pain management program with [place] to help with pain control. She will see Dr. Moshe Cipro, her nephrologist, again next month. Her BP is stable. She has had her flu shot and she plans to get the new Covid vaccine as soon as it is available.   Review of Systems  Constitutional: Negative.   HENT: Negative.    Eyes: Negative.   Respiratory: Negative.    Cardiovascular: Negative.   Gastrointestinal: Negative.   Genitourinary:  Negative for decreased urine volume, difficulty urinating, dyspareunia, dysuria, enuresis, flank pain, frequency, hematuria, pelvic pain and urgency.  Musculoskeletal: Negative.   Skin: Negative.   Neurological: Negative.  Negative for headaches.  Psychiatric/Behavioral: Negative.         Objective:   Physical Exam Constitutional:      General: She is not in acute distress.    Appearance: Normal appearance. She is well-developed.  HENT:     Head: Normocephalic and atraumatic.     Right Ear: External ear normal.     Left Ear: External ear normal.     Nose: Nose normal.     Mouth/Throat:     Pharynx: No oropharyngeal exudate.  Eyes:     General: No scleral icterus.    Conjunctiva/sclera: Conjunctivae normal.     Pupils: Pupils are equal, round, and reactive to light.  Neck:     Thyroid: No thyromegaly.     Vascular: No JVD.  Cardiovascular:     Rate and Rhythm: Normal rate and regular rhythm.     Heart sounds: Normal heart sounds. No murmur heard.    No friction rub. No gallop.  Pulmonary:     Effort: Pulmonary effort is normal. No respiratory distress.     Breath sounds: Normal breath  sounds. No wheezing or rales.  Chest:     Chest wall: No tenderness.  Abdominal:     General: Bowel sounds are normal. There is no distension.     Palpations: Abdomen is soft. There is no mass.     Tenderness: There is no abdominal tenderness. There is no guarding or rebound.  Musculoskeletal:        General: No tenderness. Normal range of motion.     Cervical back: Normal range of motion and neck supple.  Lymphadenopathy:     Cervical: No cervical adenopathy.  Skin:    General: Skin is warm and dry.     Findings: No erythema or rash.  Neurological:     Mental Status: She is alert and oriented to person, place, and time.     Cranial Nerves: No cranial nerve deficit.     Motor: No abnormal muscle tone.     Coordination: Coordination normal.     Deep Tendon Reflexes: Reflexes are normal and symmetric. Reflexes normal.  Psychiatric:        Behavior: Behavior normal.        Thought Content: Thought content normal.        Judgment: Judgment normal.           Assessment & Plan:  Her OA and RA  are stable. Her HTN is well controlled. We will get fasting labs to check an A1c, renal function, etc. Check a thyroid panel and a vitamin D level. We spent a total of ( 34  ) minutes reviewing records and discussing these issues.  Alysia Penna, MD

## 2022-08-30 ENCOUNTER — Other Ambulatory Visit: Payer: Self-pay

## 2022-08-30 DIAGNOSIS — E559 Vitamin D deficiency, unspecified: Secondary | ICD-10-CM

## 2022-08-30 MED ORDER — VITAMIN D (ERGOCALCIFEROL) 1.25 MG (50000 UNIT) PO CAPS: 50000.0000 [IU] | ORAL_CAPSULE | ORAL | 3 refills | Status: DC

## 2022-08-31 LAB — DRUG MONITOR, PANEL 1, W/CONF, URINE
Alphahydroxyalprazolam: NEGATIVE ng/mL (ref <25)
Alphahydroxymidazolam: NEGATIVE ng/mL (ref <50)
Alphahydroxytriazolam: NEGATIVE ng/mL (ref <50)
Aminoclonazepam: NEGATIVE ng/mL (ref <25)
Amphetamines: NEGATIVE ng/mL (ref <500)
Barbiturates: NEGATIVE ng/mL (ref <300)
Benzodiazepines: POSITIVE ng/mL — AB (ref <100)
Cocaine Metabolite: NEGATIVE ng/mL (ref <150)
Codeine: NEGATIVE ng/mL (ref <50)
Creatinine: 56.2 mg/dL (ref >=20.0)
Hydrocodone: 770 ng/mL — ABNORMAL HIGH (ref <50)
Hydromorphone: 518 ng/mL — ABNORMAL HIGH (ref <50)
Hydroxyethylflurazepam: NEGATIVE ng/mL (ref <50)
Lorazepam: NEGATIVE ng/mL (ref <50)
Marijuana Metabolite: NEGATIVE ng/mL (ref <20)
Methadone Metabolite: NEGATIVE ng/mL (ref <100)
Morphine: NEGATIVE ng/mL (ref <50)
Nordiazepam: 308 ng/mL — ABNORMAL HIGH (ref <50)
Norhydrocodone: 1285 ng/mL — ABNORMAL HIGH (ref <50)
Opiates: POSITIVE ng/mL — AB (ref <100)
Oxazepam: 569 ng/mL — ABNORMAL HIGH (ref <50)
Oxidant: NEGATIVE ug/mL (ref <200)
Oxycodone: NEGATIVE ng/mL (ref <100)
Phencyclidine: NEGATIVE ng/mL (ref <25)
Temazepam: 323 ng/mL — ABNORMAL HIGH (ref <50)
pH: 7 (ref 4.5–9.0)

## 2022-08-31 LAB — DM TEMPLATE

## 2022-09-03 ENCOUNTER — Encounter: Payer: Self-pay | Admitting: Family Medicine

## 2022-09-03 ENCOUNTER — Telehealth (INDEPENDENT_AMBULATORY_CARE_PROVIDER_SITE_OTHER): Payer: Medicare Other | Admitting: Family Medicine

## 2022-09-03 DIAGNOSIS — G8929 Other chronic pain: Secondary | ICD-10-CM | POA: Diagnosis not present

## 2022-09-03 DIAGNOSIS — M545 Low back pain, unspecified: Secondary | ICD-10-CM | POA: Diagnosis not present

## 2022-09-03 DIAGNOSIS — F1190 Opioid use, unspecified, uncomplicated: Secondary | ICD-10-CM

## 2022-09-03 MED ORDER — HYDROCODONE-ACETAMINOPHEN 5-325 MG PO TABS: 1.0000 | ORAL_TABLET | Freq: Four times a day (QID) | ORAL | 0 refills | Status: DC | PRN

## 2022-09-03 NOTE — Progress Notes (Signed)
   Subjective:    Patient ID: Deborah Bryant, female    DOB: Oct 21, 1946, 76 y.o.   MRN: [id number]  HPI Virtual Visit via Telephone Note  I connected with the patient on 09/03/22 at  1:30 PM EDT by telephone and verified that I am speaking with the correct person using two identifiers.   I discussed the limitations, risks, security and privacy concerns of performing an evaluation and management service by telephone and the availability of in person appointments. I also discussed with the patient that there may be a patient responsible charge related to this service. The patient expressed understanding and agreed to proceed.  Location patient: home Location provider: work or home office Participants present for the call: patient, provider Patient did not have a visit in the prior 7 days to address this/these issue(s).   History of Present Illness: Here for pain management. Her back pain is always there but is manageable.    Observations/Objective: Patient sounds cheerful and well on the phone. I do not appreciate any SOB. Speech and thought processing are grossly intact. Patient reported vitals:  Assessment and Plan: Pain management. Indication for chronic opioid: low back pain Medication and dose: Norco 5-325 # pills per month: 120 Last UDS date: 08-28-22 Opioid Treatment Agreement signed (Y/N): 03-19-18 Opioid Treatment Agreement last reviewed with patient:  09-03-22 [facility] reviewed this encounter (include red flags): Yes Meds were refilled.  Alysia Penna, MD   Follow Up Instructions:     [phone] [phone] 11-20 [id number] 21-30 I did not refer this patient for an OV in the next 24 hours for this/these issue(s).  I discussed the assessment and treatment plan with the patient. The patient was provided an opportunity to ask questions and all were answered. The patient agreed with the plan and demonstrated an understanding of the instructions.   The patient was advised to call back  or seek an in-person evaluation if the symptoms worsen or if the condition fails to improve as anticipated.  I provided 14 minutes of non-face-to-face time during this encounter.   Alysia Penna, MD     Review of Systems     Objective:   Physical Exam        Assessment & Plan:

## 2022-09-17 DIAGNOSIS — Z720 Tobacco use: Secondary | ICD-10-CM | POA: Diagnosis not present

## 2022-09-17 DIAGNOSIS — Z6831 Body mass index (BMI) 31.0-31.9, adult: Secondary | ICD-10-CM | POA: Diagnosis not present

## 2022-09-17 DIAGNOSIS — R432 Parageusia: Secondary | ICD-10-CM | POA: Diagnosis not present

## 2022-09-17 DIAGNOSIS — R319 Hematuria, unspecified: Secondary | ICD-10-CM | POA: Diagnosis not present

## 2022-09-17 DIAGNOSIS — N182 Chronic kidney disease, stage 2 (mild): Secondary | ICD-10-CM | POA: Diagnosis not present

## 2022-09-17 DIAGNOSIS — I129 Hypertensive chronic kidney disease with stage 1 through stage 4 chronic kidney disease, or unspecified chronic kidney disease: Secondary | ICD-10-CM | POA: Diagnosis not present

## 2022-10-08 DIAGNOSIS — Z1231 Encounter for screening mammogram for malignant neoplasm of breast: Secondary | ICD-10-CM | POA: Diagnosis not present

## 2022-11-19 ENCOUNTER — Telehealth (INDEPENDENT_AMBULATORY_CARE_PROVIDER_SITE_OTHER): Payer: Medicare Other | Admitting: Family Medicine

## 2022-11-19 ENCOUNTER — Encounter: Payer: Self-pay | Admitting: Family Medicine

## 2022-11-19 DIAGNOSIS — M545 Low back pain, unspecified: Secondary | ICD-10-CM | POA: Diagnosis not present

## 2022-11-19 DIAGNOSIS — G8929 Other chronic pain: Secondary | ICD-10-CM | POA: Diagnosis not present

## 2022-11-19 DIAGNOSIS — F1190 Opioid use, unspecified, uncomplicated: Secondary | ICD-10-CM | POA: Diagnosis not present

## 2022-11-19 MED ORDER — HYDROCODONE-ACETAMINOPHEN 5-325 MG PO TABS: 1.0000 | ORAL_TABLET | Freq: Four times a day (QID) | ORAL | 0 refills | Status: DC | PRN

## 2022-11-19 NOTE — Progress Notes (Signed)
   Subjective:    Patient ID: Deborah Bryant, female    DOB: Sep 07, 1946, 76 y.o.   MRN: [id number]  HPI Virtual Visit via Telephone Note  I connected with the patient on 11/19/22 at  1:15 PM EST by telephone and verified that I am speaking with the correct person using two identifiers.   I discussed the limitations, risks, security and privacy concerns of performing an evaluation and management service by telephone and the availability of in person appointments. I also discussed with the patient that there may be a patient responsible charge related to this service. The patient expressed understanding and agreed to proceed.  Location patient: home Location provider: work or home office Participants present for the call: patient, provider Patient did not have a visit in the prior 7 days to address this/these issue(s).   History of Present Illness: Here for pain management. She is doing about the same with her pain levels. She will see her Rheumatologist next week.    Observations/Objective: Patient sounds cheerful and well on the phone. I do not appreciate any SOB. Speech and thought processing are grossly intact. Patient reported vitals:  Assessment and Plan: Pain management. Indication for chronic opioid: lowbackpain Medication and dose: Norco 5-325 # pills per month: 120 Last UDS date: 08-28-22 Opioid Treatment Agreement signed (Y/N): 03-19-18 Opioid Treatment Agreement last reviewed with patient:  11-19-22 [facility] reviewed this encounter (include red flags): Yes Meds were refilled.  Alysia Penna, MD   Follow Up Instructions:     [phone] 5-10 [phone] 11-20 [phone] 21-30 I did not refer this patient for an OV in the next 24 hours for this/these issue(s).  I discussed the assessment and treatment plan with the patient. The patient was provided an opportunity to ask questions and all were answered. The patient agreed with the plan and demonstrated an understanding of the  instructions.   The patient was advised to call back or seek an in-person evaluation if the symptoms worsen or if the condition fails to improve as anticipated.  I provided 17 minutes of non-face-to-face time during this encounter.   Alysia Penna, MD     Review of Systems     Objective:   Physical Exam        Assessment & Plan:

## 2022-11-27 DIAGNOSIS — M1009 Idiopathic gout, multiple sites: Secondary | ICD-10-CM | POA: Diagnosis not present

## 2022-11-27 DIAGNOSIS — M064 Inflammatory polyarthropathy: Secondary | ICD-10-CM | POA: Diagnosis not present

## 2022-11-27 DIAGNOSIS — Z79899 Other long term (current) drug therapy: Secondary | ICD-10-CM | POA: Diagnosis not present

## 2022-11-27 DIAGNOSIS — E669 Obesity, unspecified: Secondary | ICD-10-CM | POA: Diagnosis not present

## 2022-11-27 DIAGNOSIS — M1991 Primary osteoarthritis, unspecified site: Secondary | ICD-10-CM | POA: Diagnosis not present

## 2022-11-27 DIAGNOSIS — Z6831 Body mass index (BMI) 31.0-31.9, adult: Secondary | ICD-10-CM | POA: Diagnosis not present

## 2022-12-12 DIAGNOSIS — R7989 Other specified abnormal findings of blood chemistry: Secondary | ICD-10-CM | POA: Diagnosis not present

## 2023-03-04 ENCOUNTER — Encounter: Payer: Self-pay | Admitting: Family Medicine

## 2023-03-04 ENCOUNTER — Ambulatory Visit (INDEPENDENT_AMBULATORY_CARE_PROVIDER_SITE_OTHER): Payer: Medicare Other | Admitting: Family Medicine

## 2023-03-04 VITALS — BP 136/62 | HR 61 | Temp 97.7°F | Wt 182.0 lb

## 2023-03-04 DIAGNOSIS — M545 Low back pain, unspecified: Secondary | ICD-10-CM

## 2023-03-04 DIAGNOSIS — G8929 Other chronic pain: Secondary | ICD-10-CM | POA: Diagnosis not present

## 2023-03-04 DIAGNOSIS — F1190 Opioid use, unspecified, uncomplicated: Secondary | ICD-10-CM | POA: Diagnosis not present

## 2023-03-04 MED ORDER — HYDROCODONE-ACETAMINOPHEN 5-325 MG PO TABS: 1.0000 | ORAL_TABLET | Freq: Four times a day (QID) | ORAL | 0 refills | Status: DC | PRN

## 2023-03-04 NOTE — Progress Notes (Signed)
   Subjective:    Patient ID: Deborah Bryant, female    DOB: 1946-08-19, 77 y.o.   MRN: [id number]  HPI Here for pain management. She is doing well.    Review of Systems  Constitutional: Negative.   Musculoskeletal:  Positive for arthralgias and back pain.       Objective:   Physical Exam Constitutional:      Appearance: Normal appearance.  Neurological:     Mental Status: She is alert.           Assessment & Plan:  Pain management.  Indication for chronic opioid: low back pain Medication and dose: Norco 5-325 # pills per month: 120 Last UDS date: 08-28-22 Opioid Treatment Agreement signed (Y/N): 03-19-18 Opioid Treatment Agreement last reviewed with patient:  03-04-23 [facility] reviewed this encounter (include red flags): Yes Med were refilled.  Alysia Penna, MD

## 2023-03-26 DIAGNOSIS — Q6211 Congenital occlusion of ureteropelvic junction: Secondary | ICD-10-CM | POA: Diagnosis not present

## 2023-05-20 DIAGNOSIS — R319 Hematuria, unspecified: Secondary | ICD-10-CM | POA: Diagnosis not present

## 2023-05-20 DIAGNOSIS — N189 Chronic kidney disease, unspecified: Secondary | ICD-10-CM | POA: Diagnosis not present

## 2023-05-20 DIAGNOSIS — Z720 Tobacco use: Secondary | ICD-10-CM | POA: Diagnosis not present

## 2023-05-20 DIAGNOSIS — I129 Hypertensive chronic kidney disease with stage 1 through stage 4 chronic kidney disease, or unspecified chronic kidney disease: Secondary | ICD-10-CM | POA: Diagnosis not present

## 2023-05-20 DIAGNOSIS — N182 Chronic kidney disease, stage 2 (mild): Secondary | ICD-10-CM | POA: Diagnosis not present

## 2023-05-20 DIAGNOSIS — Z6831 Body mass index (BMI) 31.0-31.9, adult: Secondary | ICD-10-CM | POA: Diagnosis not present

## 2023-05-20 DIAGNOSIS — R432 Parageusia: Secondary | ICD-10-CM | POA: Diagnosis not present

## 2023-05-20 DIAGNOSIS — E1129 Type 2 diabetes mellitus with other diabetic kidney complication: Secondary | ICD-10-CM | POA: Diagnosis not present

## 2023-05-21 LAB — LAB REPORT - SCANNED
A1c: 6.4
Creatinine, POC: 78.2 mg/dL
EGFR: 74

## 2023-05-27 DIAGNOSIS — Z79899 Other long term (current) drug therapy: Secondary | ICD-10-CM | POA: Diagnosis not present

## 2023-05-27 DIAGNOSIS — M0579 Rheumatoid arthritis with rheumatoid factor of multiple sites without organ or systems involvement: Secondary | ICD-10-CM | POA: Diagnosis not present

## 2023-05-27 DIAGNOSIS — Z6831 Body mass index (BMI) 31.0-31.9, adult: Secondary | ICD-10-CM | POA: Diagnosis not present

## 2023-05-27 DIAGNOSIS — M1009 Idiopathic gout, multiple sites: Secondary | ICD-10-CM | POA: Diagnosis not present

## 2023-05-27 DIAGNOSIS — R808 Other proteinuria: Secondary | ICD-10-CM | POA: Diagnosis not present

## 2023-05-27 DIAGNOSIS — E669 Obesity, unspecified: Secondary | ICD-10-CM | POA: Diagnosis not present

## 2023-05-27 DIAGNOSIS — M1991 Primary osteoarthritis, unspecified site: Secondary | ICD-10-CM | POA: Diagnosis not present

## 2023-06-02 ENCOUNTER — Encounter: Payer: Self-pay | Admitting: Family Medicine

## 2023-06-02 ENCOUNTER — Ambulatory Visit (INDEPENDENT_AMBULATORY_CARE_PROVIDER_SITE_OTHER): Payer: Medicare Other | Admitting: Family Medicine

## 2023-06-02 VITALS — BP 160/60 | HR 60 | Temp 98.4°F | Wt 183.0 lb

## 2023-06-02 DIAGNOSIS — M545 Low back pain, unspecified: Secondary | ICD-10-CM

## 2023-06-02 DIAGNOSIS — F1190 Opioid use, unspecified, uncomplicated: Secondary | ICD-10-CM

## 2023-06-02 DIAGNOSIS — G8929 Other chronic pain: Secondary | ICD-10-CM | POA: Diagnosis not present

## 2023-06-02 MED ORDER — HYDROCODONE-ACETAMINOPHEN 5-325 MG PO TABS: 1.0000 | ORAL_TABLET | Freq: Four times a day (QID) | ORAL | 0 refills | Status: DC | PRN

## 2023-06-02 MED ORDER — DILTIAZEM HCL ER COATED BEADS 300 MG PO CP24: 300.0000 mg | ORAL_CAPSULE | Freq: Every day | ORAL | 3 refills | Status: DC

## 2023-06-02 NOTE — Progress Notes (Signed)
   Subjective:    Patient ID: Deborah Bryant, female    DOB: [birth year], 77 y.o.   MRN: [id number]  HPI Here for pain management. She feels about the same. She sees Rheumatology periodically.    Review of Systems  Constitutional: Negative.   Musculoskeletal:  Positive for arthralgias and back pain.       Objective:   Physical Exam Constitutional:      Appearance: Normal appearance.  Neurological:     Mental Status: She is alert.           Assessment & Plan:  Pain management.  Indication for chronic opioid: low back pain Medication and dose: Norco 5-325 # pills per month: 120 Last UDS date: 08-28-22 Opioid Treatment Agreement signed (Y/N): 03-19-18 Opioid Treatment Agreement last reviewed with patient:  06-02-23 [facility] reviewed this encounter (include red flags): Yes Med were refilled.  Gershon Crane, MD

## 2023-07-29 DIAGNOSIS — Z6831 Body mass index (BMI) 31.0-31.9, adult: Secondary | ICD-10-CM | POA: Diagnosis not present

## 2023-07-29 DIAGNOSIS — N182 Chronic kidney disease, stage 2 (mild): Secondary | ICD-10-CM | POA: Diagnosis not present

## 2023-07-29 DIAGNOSIS — Z720 Tobacco use: Secondary | ICD-10-CM | POA: Diagnosis not present

## 2023-07-29 DIAGNOSIS — R319 Hematuria, unspecified: Secondary | ICD-10-CM | POA: Diagnosis not present

## 2023-07-29 DIAGNOSIS — I129 Hypertensive chronic kidney disease with stage 1 through stage 4 chronic kidney disease, or unspecified chronic kidney disease: Secondary | ICD-10-CM | POA: Diagnosis not present

## 2023-07-29 DIAGNOSIS — R432 Parageusia: Secondary | ICD-10-CM | POA: Diagnosis not present

## 2023-07-31 ENCOUNTER — Encounter (INDEPENDENT_AMBULATORY_CARE_PROVIDER_SITE_OTHER): Payer: Self-pay

## 2023-08-19 ENCOUNTER — Ambulatory Visit (INDEPENDENT_AMBULATORY_CARE_PROVIDER_SITE_OTHER): Payer: Medicare Other | Admitting: Family Medicine

## 2023-08-19 ENCOUNTER — Encounter: Payer: Self-pay | Admitting: Family Medicine

## 2023-08-19 VITALS — BP 160/60 | HR 66 | Temp 97.8°F | Wt 184.0 lb

## 2023-08-19 DIAGNOSIS — E782 Mixed hyperlipidemia: Secondary | ICD-10-CM | POA: Diagnosis not present

## 2023-08-19 DIAGNOSIS — M25562 Pain in left knee: Secondary | ICD-10-CM | POA: Diagnosis not present

## 2023-08-19 DIAGNOSIS — D649 Anemia, unspecified: Secondary | ICD-10-CM

## 2023-08-19 DIAGNOSIS — R0602 Shortness of breath: Secondary | ICD-10-CM | POA: Diagnosis not present

## 2023-08-19 DIAGNOSIS — R600 Localized edema: Secondary | ICD-10-CM

## 2023-08-19 DIAGNOSIS — I10 Essential (primary) hypertension: Secondary | ICD-10-CM

## 2023-08-19 DIAGNOSIS — N182 Chronic kidney disease, stage 2 (mild): Secondary | ICD-10-CM | POA: Diagnosis not present

## 2023-08-19 DIAGNOSIS — G8929 Other chronic pain: Secondary | ICD-10-CM | POA: Insufficient documentation

## 2023-08-19 DIAGNOSIS — E039 Hypothyroidism, unspecified: Secondary | ICD-10-CM

## 2023-08-19 DIAGNOSIS — E119 Type 2 diabetes mellitus without complications: Secondary | ICD-10-CM

## 2023-08-19 DIAGNOSIS — E538 Deficiency of other specified B group vitamins: Secondary | ICD-10-CM

## 2023-08-19 DIAGNOSIS — E559 Vitamin D deficiency, unspecified: Secondary | ICD-10-CM

## 2023-08-19 DIAGNOSIS — M25561 Pain in right knee: Secondary | ICD-10-CM

## 2023-08-19 DIAGNOSIS — K589 Irritable bowel syndrome without diarrhea: Secondary | ICD-10-CM

## 2023-08-19 DIAGNOSIS — M1000 Idiopathic gout, unspecified site: Secondary | ICD-10-CM | POA: Diagnosis not present

## 2023-08-19 DIAGNOSIS — F1190 Opioid use, unspecified, uncomplicated: Secondary | ICD-10-CM

## 2023-08-19 LAB — CBC WITH DIFFERENTIAL/PLATELET
Basophils Absolute: 0.1 10*3/uL (ref 0.0–0.1)
Basophils Relative: 1 % (ref 0.0–3.0)
Eosinophils Absolute: 0.2 10*3/uL (ref 0.0–0.7)
Eosinophils Relative: 1.8 % (ref 0.0–5.0)
HCT: 44.2 % (ref 36.0–46.0)
Hemoglobin: 13.9 g/dL (ref 12.0–15.0)
Lymphocytes Relative: 11.5 % — ABNORMAL LOW (ref 12.0–46.0)
Lymphs Abs: 1.3 10*3/uL (ref 0.7–4.0)
MCHC: 31.4 g/dL (ref 30.0–36.0)
MCV: 90.1 fl (ref 78.0–100.0)
Monocytes Absolute: 0.8 10*3/uL (ref 0.1–1.0)
Monocytes Relative: 7.3 % (ref 3.0–12.0)
Neutro Abs: 8.7 10*3/uL — ABNORMAL HIGH (ref 1.4–7.7)
Neutrophils Relative %: 78.4 % — ABNORMAL HIGH (ref 43.0–77.0)
Platelets: 321 10*3/uL (ref 150.0–400.0)
RBC: 4.91 Mil/uL (ref 3.87–5.11)
RDW: 15.2 % (ref 11.5–15.5)
WBC: 11.1 10*3/uL — ABNORMAL HIGH (ref 4.0–10.5)

## 2023-08-19 LAB — HEPATIC FUNCTION PANEL
ALT: 11 U/L (ref 0–35)
AST: 16 U/L (ref 0–37)
Albumin: 4.3 g/dL (ref 3.5–5.2)
Alkaline Phosphatase: 169 U/L — ABNORMAL HIGH (ref 39–117)
Bilirubin, Direct: 0.2 mg/dL (ref 0.0–0.3)
Total Bilirubin: 0.6 mg/dL (ref 0.2–1.2)
Total Protein: 7.8 g/dL (ref 6.0–8.3)

## 2023-08-19 LAB — LIPID PANEL
Cholesterol: 141 mg/dL (ref 0–200)
HDL: 81.6 mg/dL (ref >39.00)
LDL Cholesterol: 47 mg/dL (ref 0–99)
NonHDL: 59.84
Total CHOL/HDL Ratio: 2
Triglycerides: 64 mg/dL (ref 0.0–149.0)
VLDL: 12.8 mg/dL (ref 0.0–40.0)

## 2023-08-19 LAB — BASIC METABOLIC PANEL
BUN: 12 mg/dL (ref 6–23)
CO2: 27 meq/L (ref 19–32)
Calcium: 9.7 mg/dL (ref 8.4–10.5)
Chloride: 98 meq/L (ref 96–112)
Creatinine, Ser: 0.79 mg/dL (ref 0.40–1.20)
GFR: 72.37 mL/min (ref >60.00)
Glucose, Bld: 104 mg/dL — ABNORMAL HIGH (ref 70–99)
Potassium: 3.4 meq/L — ABNORMAL LOW (ref 3.5–5.1)
Sodium: 136 meq/L (ref 135–145)

## 2023-08-19 LAB — HEMOGLOBIN A1C: Hgb A1c MFr Bld: 6.3 % (ref 4.6–6.5)

## 2023-08-19 LAB — URIC ACID: Uric Acid, Serum: 4.1 mg/dL (ref 2.4–7.0)

## 2023-08-19 MED ORDER — ALLOPURINOL 300 MG PO TABS: 300.0000 mg | ORAL_TABLET | Freq: Every day | ORAL | 3 refills | Status: DC

## 2023-08-19 MED ORDER — ATORVASTATIN CALCIUM 40 MG PO TABS: 40.0000 mg | ORAL_TABLET | Freq: Every day | ORAL | 3 refills | Status: DC

## 2023-08-19 MED ORDER — DIAZEPAM 5 MG PO TABS: 5.0000 mg | ORAL_TABLET | Freq: Three times a day (TID) | ORAL | 5 refills | Status: DC | PRN

## 2023-08-19 MED ORDER — LASIX 20 MG PO TABS: 20.0000 mg | ORAL_TABLET | Freq: Two times a day (BID) | ORAL | Status: DC

## 2023-08-19 MED ORDER — DILTIAZEM HCL ER COATED BEADS 360 MG PO CP24: 360.0000 mg | ORAL_CAPSULE | Freq: Every day | ORAL | 3 refills | Status: DC

## 2023-08-19 MED ORDER — GLIPIZIDE ER 5 MG PO TB24: 5.0000 mg | ORAL_TABLET | Freq: Every day | ORAL | 3 refills | Status: DC

## 2023-08-19 NOTE — Progress Notes (Signed)
Subjective:    Patient ID: Deborah Bryant, female    DOB: [birth year], 77 y.o.   MRN: [id number]  HPI Here to follow up on issues and to discuss leg swelling. The swelling began in her feet about 6 months ago, and it has slowly worsened. Now her entire lower legs feel swollen. No leg pain. No chest pain. She has been more SOB on exertion lately, such as when walking around the grocery store. She saw her nephrologist, Dr. Kathrene Bongo, in June, and she started Alvino Chapel on Lasix 20 mg BID. Leaster has not seen much of an effect yet. Her creatinine in June was 0.82 and the GFR was 74. Her Hgb is normal at 13.9. her A1c was 6.4%. In addition to the leg swelling, she says her legs feel weak, and sometimes she worries that she might fall. Finally she complains of worsening pain in both knees, particularly the right knee.    Review of Systems  Constitutional:  Positive for fatigue.  HENT: Negative.    Eyes: Negative.   Respiratory:  Positive for shortness of breath. Negative for cough and wheezing.   Cardiovascular:  Positive for leg swelling. Negative for chest pain and palpitations.  Gastrointestinal: Negative.   Genitourinary:  Negative for decreased urine volume, difficulty urinating, dyspareunia, dysuria, enuresis, flank pain, frequency, hematuria, pelvic pain and urgency.  Musculoskeletal:  Positive for arthralgias and gait problem.  Skin: Negative.   Neurological:  Positive for weakness. Negative for headaches.  Psychiatric/Behavioral: Negative.         Objective:   Physical Exam Constitutional:      General: She is not in acute distress.    Appearance: Normal appearance. She is well-developed.  HENT:     Head: Normocephalic and atraumatic.     Right Ear: External ear normal.     Left Ear: External ear normal.     Nose: Nose normal.     Mouth/Throat:     Pharynx: No oropharyngeal exudate.  Eyes:     General: No scleral icterus.    Conjunctiva/sclera: Conjunctivae normal.     Pupils:  Pupils are equal, round, and reactive to light.  Neck:     Thyroid: No thyromegaly.     Vascular: No JVD.  Cardiovascular:     Rate and Rhythm: Normal rate and regular rhythm.     Pulses: Normal pulses.     Heart sounds: Normal heart sounds. No murmur heard.    No friction rub. No gallop.     Comments: EKG today is normal except for a single PVC Pulmonary:     Effort: Pulmonary effort is normal. No respiratory distress.     Breath sounds: Normal breath sounds. No wheezing or rales.  Chest:     Chest wall: No tenderness.  Abdominal:     General: Bowel sounds are normal. There is no distension.     Palpations: Abdomen is soft. There is no mass.     Tenderness: There is no abdominal tenderness. There is no guarding or rebound.  Musculoskeletal:        General: Normal range of motion.     Cervical back: Normal range of motion and neck supple.     Comments: Both legs have 2+ edema up to the knees.   Lymphadenopathy:     Cervical: No cervical adenopathy.  Skin:    General: Skin is warm and dry.     Findings: No erythema or rash.  Neurological:     General: No  focal deficit present.     Mental Status: She is alert and oriented to person, place, and time.     Cranial Nerves: No cranial nerve deficit.     Motor: No abnormal muscle tone.     Coordination: Coordination normal.     Deep Tendon Reflexes: Reflexes are normal and symmetric. Reflexes normal.  Psychiatric:        Mood and Affect: Mood normal.        Behavior: Behavior normal.        Thought Content: Thought content normal.        Judgment: Judgment normal.           Assessment & Plan:  She has having swelling in the legs, and I advised her to stay on the current dosage of Lasix. We will get fasting labs to check a BNP, B12 level, TSH, etc. Set up a myocardial perfusion test, as well as an ECHO to assess for possible CHF. Her BP is a little high, so we will increase the Diltiazem CR to 360 mg daily. Check an A1c for the  diabetes. Refer to Orthopedics for the knee pains. We spent a total of ( 35  ) minutes reviewing records and discussing these issues.  Gershon Crane, MD

## 2023-08-20 LAB — VITAMIN D 25 HYDROXY (VIT D DEFICIENCY, FRACTURES): VITD: 34 ng/mL (ref 30.00–100.00)

## 2023-08-20 LAB — VITAMIN B12: Vitamin B-12: 227 pg/mL (ref 211–911)

## 2023-08-20 LAB — TSH: TSH: 2.47 u[IU]/mL (ref 0.35–5.50)

## 2023-08-20 LAB — BRAIN NATRIURETIC PEPTIDE: Pro B Natriuretic peptide (BNP): 237 pg/mL — ABNORMAL HIGH (ref 0.0–100.0)

## 2023-08-20 NOTE — Addendum Note (Signed)
Addended by: Gershon Crane A on: 08/20/2023 08:00 AM   Modules accepted: Orders

## 2023-08-22 LAB — DRUG MONITOR, PANEL 1, W/CONF, URINE
Alphahydroxyalprazolam: NEGATIVE ng/mL
Alphahydroxymidazolam: NEGATIVE ng/mL
Alphahydroxytriazolam: NEGATIVE ng/mL
Aminoclonazepam: NEGATIVE ng/mL
Amphetamines: NEGATIVE ng/mL
Barbiturates: NEGATIVE ng/mL
Benzodiazepines: POSITIVE ng/mL — AB
Cocaine Metabolite: NEGATIVE ng/mL
Codeine: NEGATIVE ng/mL
Creatinine: 33.4 mg/dL
Hydrocodone: 773 ng/mL — ABNORMAL HIGH
Hydromorphone: 363 ng/mL — ABNORMAL HIGH
Hydroxyethylflurazepam: NEGATIVE ng/mL
Lorazepam: NEGATIVE ng/mL
Marijuana Metabolite: NEGATIVE ng/mL
Methadone Metabolite: NEGATIVE ng/mL
Morphine: NEGATIVE ng/mL
Nordiazepam: 180 ng/mL — ABNORMAL HIGH
Norhydrocodone: 852 ng/mL — ABNORMAL HIGH
Opiates: POSITIVE ng/mL — AB
Oxazepam: 299 ng/mL — ABNORMAL HIGH
Oxidant: NEGATIVE ug/mL
Oxycodone: NEGATIVE ng/mL
Phencyclidine: NEGATIVE ng/mL
Temazepam: 194 ng/mL — ABNORMAL HIGH
pH: 6.1 (ref 4.5–9.0)

## 2023-08-22 LAB — DM TEMPLATE

## 2023-08-25 ENCOUNTER — Encounter: Payer: Self-pay | Admitting: Family Medicine

## 2023-08-26 MED ORDER — POTASSIUM CHLORIDE ER 10 MEQ PO TBCR: 10.0000 meq | EXTENDED_RELEASE_TABLET | Freq: Every day | ORAL | 3 refills | Status: DC

## 2023-08-26 NOTE — Telephone Encounter (Signed)
Reviewed lab results with pt verbalized understanding. Pt new Rx for Potassium sent to her pharmacy.

## 2023-09-01 ENCOUNTER — Ambulatory Visit: Payer: Medicare Other | Admitting: Orthopedic Surgery

## 2023-09-02 ENCOUNTER — Ambulatory Visit: Payer: Medicare Other | Admitting: Family Medicine

## 2023-09-03 ENCOUNTER — Telehealth (HOSPITAL_COMMUNITY): Payer: Self-pay | Admitting: *Deleted

## 2023-09-03 NOTE — Telephone Encounter (Signed)
Patient's husband given detailed instructions per Myocardial Perfusion Study Information Sheet for the test on 09/08/2023 at 10:15. Patient notified to arrive 15 minutes early and that it is imperative to arrive on time for appointment to keep from having the test rescheduled.  If you need to cancel or reschedule your appointment, please call the office within 24 hours of your appointment. . Patient verbalized understanding.Daneil Dolin

## 2023-09-08 ENCOUNTER — Ambulatory Visit (HOSPITAL_BASED_OUTPATIENT_CLINIC_OR_DEPARTMENT_OTHER): Payer: Medicare Other

## 2023-09-08 ENCOUNTER — Ambulatory Visit (HOSPITAL_COMMUNITY): Payer: Medicare Other | Attending: Cardiology

## 2023-09-08 DIAGNOSIS — R0602 Shortness of breath: Secondary | ICD-10-CM | POA: Diagnosis not present

## 2023-09-08 LAB — MYOCARDIAL PERFUSION IMAGING
Base ST Depression (mm): 0 mm
LV dias vol: 102 mL (ref 46–106)
LV sys vol: 44 mL
Nuc Stress EF: 56 %
Peak HR: 75 {beats}/min
Rest HR: 67 {beats}/min
Rest Nuclear Isotope Dose: 10.2 mCi
SDS: 2
SRS: 0
SSS: 2
ST Depression (mm): 0 mm
Stress Nuclear Isotope Dose: 32.1 mCi
TID: 0.98

## 2023-09-08 LAB — ECHOCARDIOGRAM COMPLETE
Area-P 1/2: 2.67 cm2
S' Lateral: 3.4 cm

## 2023-09-08 MED ORDER — TECHNETIUM TC 99M TETROFOSMIN IV KIT
10.2000 | PACK | Freq: Once | INTRAVENOUS | Status: AC | PRN
  Administered 2023-09-08: 10.2 via INTRAVENOUS

## 2023-09-08 MED ORDER — REGADENOSON 0.4 MG/5ML IV SOLN
0.4000 mg | Freq: Once | INTRAVENOUS | Status: AC
Start: 2023-09-08 — End: 2023-09-08
  Administered 2023-09-08: 0.4 mg via INTRAVENOUS

## 2023-09-08 MED ORDER — TECHNETIUM TC 99M TETROFOSMIN IV KIT
32.1000 | PACK | Freq: Once | INTRAVENOUS | Status: AC | PRN
  Administered 2023-09-08: 32.1 via INTRAVENOUS

## 2023-09-09 ENCOUNTER — Encounter: Payer: Self-pay | Admitting: Family Medicine

## 2023-09-09 ENCOUNTER — Ambulatory Visit: Payer: Medicare Other | Admitting: Family Medicine

## 2023-09-09 VITALS — BP 130/60 | HR 54 | Temp 98.4°F | Wt 183.0 lb

## 2023-09-09 DIAGNOSIS — M545 Low back pain, unspecified: Secondary | ICD-10-CM | POA: Diagnosis not present

## 2023-09-09 DIAGNOSIS — F1190 Opioid use, unspecified, uncomplicated: Secondary | ICD-10-CM | POA: Diagnosis not present

## 2023-09-09 DIAGNOSIS — Z23 Encounter for immunization: Secondary | ICD-10-CM

## 2023-09-09 DIAGNOSIS — G8929 Other chronic pain: Secondary | ICD-10-CM

## 2023-09-09 NOTE — Progress Notes (Signed)
Subjective:    Patient ID: Deborah Bryant, female    DOB: September 03, 1946, 77 y.o.   MRN: [id number]  HPI Here for pain management. She is doing about the same. She sees Dr. August Saucer soon to evaluate the right knee.    Review of Systems  Constitutional: Negative.   Musculoskeletal:  Positive for arthralgias and back pain.       Objective:   Physical Exam Constitutional:      Appearance: Normal appearance.  Neurological:     Mental Status: She is alert.           Assessment & Plan:  Pain management.  Indication for chronic opioid: low back pain Medication and dose: Norco 5-325 # pills per month: 120 Last UDS date: 08-19-23 Opioid Treatment Agreement signed (Y/N): 03-19-18 Opioid Treatment Agreement last reviewed with patient:  04-09-23 [facility] reviewed this encounter (include red flags): Yes Meds were refilled.  Gershon Crane, MD

## 2023-09-09 NOTE — Addendum Note (Signed)
Addended by: Carola Rhine on: 09/09/2023 03:33 PM   Modules accepted: Orders

## 2023-09-17 ENCOUNTER — Encounter: Payer: Self-pay | Admitting: Family Medicine

## 2023-09-17 ENCOUNTER — Ambulatory Visit (INDEPENDENT_AMBULATORY_CARE_PROVIDER_SITE_OTHER): Payer: Medicare Other | Admitting: Orthopedic Surgery

## 2023-09-17 ENCOUNTER — Other Ambulatory Visit: Payer: Self-pay | Admitting: Family Medicine

## 2023-09-17 ENCOUNTER — Encounter: Payer: Self-pay | Admitting: Orthopedic Surgery

## 2023-09-17 ENCOUNTER — Other Ambulatory Visit (INDEPENDENT_AMBULATORY_CARE_PROVIDER_SITE_OTHER): Payer: Medicare Other

## 2023-09-17 ENCOUNTER — Telehealth: Payer: Self-pay

## 2023-09-17 DIAGNOSIS — M170 Bilateral primary osteoarthritis of knee: Secondary | ICD-10-CM

## 2023-09-17 DIAGNOSIS — M25562 Pain in left knee: Secondary | ICD-10-CM

## 2023-09-17 DIAGNOSIS — M25561 Pain in right knee: Secondary | ICD-10-CM | POA: Diagnosis not present

## 2023-09-17 MED ORDER — BUPIVACAINE HCL 0.25 % IJ SOLN
4.0000 mL | INTRAMUSCULAR | Status: AC | PRN
Start: 2023-09-17 — End: 2023-09-17
  Administered 2023-09-17: 4 mL via INTRA_ARTICULAR

## 2023-09-17 MED ORDER — METHYLPREDNISOLONE ACETATE 40 MG/ML IJ SUSP
40.0000 mg | INTRAMUSCULAR | Status: AC | PRN
Start: 2023-09-17 — End: 2023-09-17
  Administered 2023-09-17: 40 mg via INTRA_ARTICULAR

## 2023-09-17 MED ORDER — LIDOCAINE HCL 1 % IJ SOLN
5.0000 mL | INTRAMUSCULAR | Status: AC | PRN
Start: 2023-09-17 — End: 2023-09-17
  Administered 2023-09-17: 5 mL

## 2023-09-17 NOTE — Telephone Encounter (Signed)
Pt called to F/U on this refill request, stating she was just here on 09/09/23 and would like to know when MD will send the 90 day supply of this Rx?

## 2023-09-17 NOTE — Telephone Encounter (Signed)
Auth needed right knee gel
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
## 2023-09-18 NOTE — Telephone Encounter (Signed)
VOB submitted for Monovisc, right knee  

## 2023-09-18 NOTE — Telephone Encounter (Signed)
Message sent to PCP for approval 

## 2023-09-18 NOTE — Telephone Encounter (Signed)
Pt LOV wass on 09/09/23 Last refill was done on 06/17/23 Please advise

## 2023-09-19 MED ORDER — HYDROCODONE-ACETAMINOPHEN 5-325 MG PO TABS: 1.0000 | ORAL_TABLET | Freq: Four times a day (QID) | ORAL | 0 refills | Status: DC | PRN

## 2023-09-19 NOTE — Telephone Encounter (Signed)
That was my mistake. I just sent them in

## 2023-10-01 DIAGNOSIS — Q6211 Congenital occlusion of ureteropelvic junction: Secondary | ICD-10-CM | POA: Diagnosis not present

## 2023-10-01 DIAGNOSIS — R3121 Asymptomatic microscopic hematuria: Secondary | ICD-10-CM | POA: Diagnosis not present

## 2023-10-21 DIAGNOSIS — Z1231 Encounter for screening mammogram for malignant neoplasm of breast: Secondary | ICD-10-CM | POA: Diagnosis not present

## 2023-10-21 LAB — HM MAMMOGRAPHY

## 2023-10-22 ENCOUNTER — Encounter: Payer: Self-pay | Admitting: Family Medicine

## 2023-11-25 DIAGNOSIS — Z79899 Other long term (current) drug therapy: Secondary | ICD-10-CM | POA: Diagnosis not present

## 2023-11-25 DIAGNOSIS — M1991 Primary osteoarthritis, unspecified site: Secondary | ICD-10-CM | POA: Diagnosis not present

## 2023-11-25 DIAGNOSIS — M0579 Rheumatoid arthritis with rheumatoid factor of multiple sites without organ or systems involvement: Secondary | ICD-10-CM | POA: Diagnosis not present

## 2023-11-25 DIAGNOSIS — M064 Inflammatory polyarthropathy: Secondary | ICD-10-CM | POA: Diagnosis not present

## 2023-11-25 DIAGNOSIS — M1009 Idiopathic gout, multiple sites: Secondary | ICD-10-CM | POA: Diagnosis not present

## 2023-11-25 DIAGNOSIS — Z6832 Body mass index (BMI) 32.0-32.9, adult: Secondary | ICD-10-CM | POA: Diagnosis not present

## 2023-11-25 DIAGNOSIS — E669 Obesity, unspecified: Secondary | ICD-10-CM | POA: Diagnosis not present

## 2023-12-03 ENCOUNTER — Encounter: Payer: Self-pay | Admitting: Family Medicine

## 2023-12-03 ENCOUNTER — Ambulatory Visit (INDEPENDENT_AMBULATORY_CARE_PROVIDER_SITE_OTHER): Payer: Medicare Other | Admitting: Family Medicine

## 2023-12-03 VITALS — BP 136/70 | HR 60 | Temp 97.8°F | Wt 187.0 lb

## 2023-12-03 DIAGNOSIS — M545 Low back pain, unspecified: Secondary | ICD-10-CM | POA: Diagnosis not present

## 2023-12-03 DIAGNOSIS — G8929 Other chronic pain: Secondary | ICD-10-CM

## 2023-12-03 DIAGNOSIS — F1190 Opioid use, unspecified, uncomplicated: Secondary | ICD-10-CM

## 2023-12-03 MED ORDER — HYDROCODONE-ACETAMINOPHEN 5-325 MG PO TABS: 1.0000 | ORAL_TABLET | Freq: Four times a day (QID) | ORAL | 0 refills | Status: DC | PRN

## 2023-12-03 NOTE — Progress Notes (Signed)
   Subjective:    Patient ID: Deborah Bryant, female    DOB: 09/12/1946, 77 y.o.   MRN: [id number]  HPI Here for pain management. She is doing about the same.    Review of Systems  Constitutional: Negative.   Musculoskeletal:  Positive for arthralgias and back pain.       Objective:   Physical Exam Constitutional:      Appearance: Normal appearance.  Neurological:     Mental Status: She is alert.           Assessment & Plan:  Pain management.  Indication for chronic opioid: low back pain Medication and dose: Norco 5-325 # pills per month: 120 Last UDS date: 08-19-23 Opioid Treatment Agreement signed (Y/N): 03-19-18 Opioid Treatment Agreement last reviewed with patient:  12-03-23 [facility] reviewed this encounter (include red flags): Yes Meds were refilled.  Gershon Crane, MD

## 2023-12-28 ENCOUNTER — Emergency Department (HOSPITAL_COMMUNITY): Payer: Medicare Other

## 2023-12-28 ENCOUNTER — Other Ambulatory Visit: Payer: Self-pay

## 2023-12-28 ENCOUNTER — Inpatient Hospital Stay (HOSPITAL_COMMUNITY)
Admission: EM | Admit: 2023-12-28 | Discharge: 2024-01-03 | DRG: 177 | Disposition: A | Discharge status: Skilled Nursing Facility | Payer: Medicare Other | Attending: Internal Medicine | Admitting: Internal Medicine

## 2023-12-28 ENCOUNTER — Encounter (HOSPITAL_COMMUNITY): Payer: Self-pay

## 2023-12-28 ENCOUNTER — Inpatient Hospital Stay (HOSPITAL_COMMUNITY): Payer: Medicare Other

## 2023-12-28 DIAGNOSIS — N182 Chronic kidney disease, stage 2 (mild): Secondary | ICD-10-CM | POA: Diagnosis present

## 2023-12-28 DIAGNOSIS — Z1152 Encounter for screening for COVID-19: Secondary | ICD-10-CM

## 2023-12-28 DIAGNOSIS — E119 Type 2 diabetes mellitus without complications: Secondary | ICD-10-CM

## 2023-12-28 DIAGNOSIS — E1122 Type 2 diabetes mellitus with diabetic chronic kidney disease: Secondary | ICD-10-CM | POA: Diagnosis not present

## 2023-12-28 DIAGNOSIS — N179 Acute kidney failure, unspecified: Secondary | ICD-10-CM | POA: Diagnosis not present

## 2023-12-28 DIAGNOSIS — M064 Inflammatory polyarthropathy: Secondary | ICD-10-CM | POA: Diagnosis present

## 2023-12-28 DIAGNOSIS — M109 Gout, unspecified: Secondary | ICD-10-CM | POA: Diagnosis present

## 2023-12-28 DIAGNOSIS — G8929 Other chronic pain: Secondary | ICD-10-CM

## 2023-12-28 DIAGNOSIS — R2689 Other abnormalities of gait and mobility: Secondary | ICD-10-CM | POA: Diagnosis not present

## 2023-12-28 DIAGNOSIS — J441 Chronic obstructive pulmonary disease with (acute) exacerbation: Secondary | ICD-10-CM | POA: Diagnosis present

## 2023-12-28 DIAGNOSIS — G9341 Metabolic encephalopathy: Secondary | ICD-10-CM | POA: Diagnosis not present

## 2023-12-28 DIAGNOSIS — J189 Pneumonia, unspecified organism: Secondary | ICD-10-CM | POA: Diagnosis not present

## 2023-12-28 DIAGNOSIS — R001 Bradycardia, unspecified: Secondary | ICD-10-CM | POA: Diagnosis present

## 2023-12-28 DIAGNOSIS — I5033 Acute on chronic diastolic (congestive) heart failure: Secondary | ICD-10-CM | POA: Diagnosis not present

## 2023-12-28 DIAGNOSIS — R5381 Other malaise: Secondary | ICD-10-CM | POA: Diagnosis not present

## 2023-12-28 DIAGNOSIS — E559 Vitamin D deficiency, unspecified: Secondary | ICD-10-CM | POA: Diagnosis not present

## 2023-12-28 DIAGNOSIS — I10 Essential (primary) hypertension: Secondary | ICD-10-CM | POA: Diagnosis present

## 2023-12-28 DIAGNOSIS — R2681 Unsteadiness on feet: Secondary | ICD-10-CM | POA: Diagnosis not present

## 2023-12-28 DIAGNOSIS — I5021 Acute systolic (congestive) heart failure: Secondary | ICD-10-CM | POA: Diagnosis not present

## 2023-12-28 DIAGNOSIS — E876 Hypokalemia: Secondary | ICD-10-CM | POA: Diagnosis not present

## 2023-12-28 DIAGNOSIS — J440 Chronic obstructive pulmonary disease with acute lower respiratory infection: Secondary | ICD-10-CM | POA: Diagnosis not present

## 2023-12-28 DIAGNOSIS — R062 Wheezing: Secondary | ICD-10-CM | POA: Diagnosis not present

## 2023-12-28 DIAGNOSIS — Z811 Family history of alcohol abuse and dependence: Secondary | ICD-10-CM

## 2023-12-28 DIAGNOSIS — G894 Chronic pain syndrome: Secondary | ICD-10-CM | POA: Diagnosis present

## 2023-12-28 DIAGNOSIS — E8770 Fluid overload, unspecified: Secondary | ICD-10-CM

## 2023-12-28 DIAGNOSIS — Z79891 Long term (current) use of opiate analgesic: Secondary | ICD-10-CM

## 2023-12-28 DIAGNOSIS — Z6834 Body mass index (BMI) 34.0-34.9, adult: Secondary | ICD-10-CM

## 2023-12-28 DIAGNOSIS — M1380 Other specified arthritis, unspecified site: Secondary | ICD-10-CM | POA: Diagnosis present

## 2023-12-28 DIAGNOSIS — G9340 Encephalopathy, unspecified: Secondary | ICD-10-CM

## 2023-12-28 DIAGNOSIS — T461X5A Adverse effect of calcium-channel blockers, initial encounter: Secondary | ICD-10-CM | POA: Diagnosis present

## 2023-12-28 DIAGNOSIS — E871 Hypo-osmolality and hyponatremia: Secondary | ICD-10-CM | POA: Diagnosis present

## 2023-12-28 DIAGNOSIS — M069 Rheumatoid arthritis, unspecified: Secondary | ICD-10-CM | POA: Diagnosis present

## 2023-12-28 DIAGNOSIS — Z7982 Long term (current) use of aspirin: Secondary | ICD-10-CM

## 2023-12-28 DIAGNOSIS — Z860101 Personal history of adenomatous and serrated colon polyps: Secondary | ICD-10-CM

## 2023-12-28 DIAGNOSIS — J9601 Acute respiratory failure with hypoxia: Secondary | ICD-10-CM | POA: Diagnosis not present

## 2023-12-28 DIAGNOSIS — I6782 Cerebral ischemia: Secondary | ICD-10-CM | POA: Diagnosis not present

## 2023-12-28 DIAGNOSIS — M6281 Muscle weakness (generalized): Secondary | ICD-10-CM | POA: Diagnosis not present

## 2023-12-28 DIAGNOSIS — T68XXXA Hypothermia, initial encounter: Secondary | ICD-10-CM

## 2023-12-28 DIAGNOSIS — I959 Hypotension, unspecified: Secondary | ICD-10-CM | POA: Diagnosis not present

## 2023-12-28 DIAGNOSIS — E66811 Obesity, class 1: Secondary | ICD-10-CM | POA: Diagnosis present

## 2023-12-28 DIAGNOSIS — J690 Pneumonitis due to inhalation of food and vomit: Principal | ICD-10-CM | POA: Diagnosis present

## 2023-12-28 DIAGNOSIS — F17210 Nicotine dependence, cigarettes, uncomplicated: Secondary | ICD-10-CM | POA: Diagnosis present

## 2023-12-28 DIAGNOSIS — J90 Pleural effusion, not elsewhere classified: Secondary | ICD-10-CM | POA: Diagnosis not present

## 2023-12-28 DIAGNOSIS — Z9981 Dependence on supplemental oxygen: Secondary | ICD-10-CM | POA: Diagnosis not present

## 2023-12-28 DIAGNOSIS — Z79899 Other long term (current) drug therapy: Secondary | ICD-10-CM

## 2023-12-28 DIAGNOSIS — R32 Unspecified urinary incontinence: Secondary | ICD-10-CM | POA: Diagnosis not present

## 2023-12-28 DIAGNOSIS — R680 Hypothermia, not associated with low environmental temperature: Secondary | ICD-10-CM | POA: Diagnosis present

## 2023-12-28 DIAGNOSIS — R0602 Shortness of breath: Secondary | ICD-10-CM | POA: Diagnosis not present

## 2023-12-28 DIAGNOSIS — Z8701 Personal history of pneumonia (recurrent): Secondary | ICD-10-CM

## 2023-12-28 DIAGNOSIS — D649 Anemia, unspecified: Secondary | ICD-10-CM | POA: Diagnosis not present

## 2023-12-28 DIAGNOSIS — N939 Abnormal uterine and vaginal bleeding, unspecified: Secondary | ICD-10-CM | POA: Diagnosis present

## 2023-12-28 DIAGNOSIS — Z888 Allergy status to other drugs, medicaments and biological substances status: Secondary | ICD-10-CM

## 2023-12-28 DIAGNOSIS — Z8614 Personal history of Methicillin resistant Staphylococcus aureus infection: Secondary | ICD-10-CM

## 2023-12-28 DIAGNOSIS — Z8261 Family history of arthritis: Secondary | ICD-10-CM

## 2023-12-28 DIAGNOSIS — I700 Atherosclerosis of aorta: Secondary | ICD-10-CM | POA: Diagnosis not present

## 2023-12-28 DIAGNOSIS — I272 Pulmonary hypertension, unspecified: Secondary | ICD-10-CM | POA: Diagnosis not present

## 2023-12-28 DIAGNOSIS — E785 Hyperlipidemia, unspecified: Secondary | ICD-10-CM | POA: Diagnosis not present

## 2023-12-28 DIAGNOSIS — R0902 Hypoxemia: Secondary | ICD-10-CM | POA: Diagnosis not present

## 2023-12-28 DIAGNOSIS — Z7401 Bed confinement status: Secondary | ICD-10-CM | POA: Diagnosis not present

## 2023-12-28 DIAGNOSIS — R410 Disorientation, unspecified: Secondary | ICD-10-CM | POA: Diagnosis not present

## 2023-12-28 DIAGNOSIS — E039 Hypothyroidism, unspecified: Secondary | ICD-10-CM | POA: Diagnosis present

## 2023-12-28 DIAGNOSIS — R9431 Abnormal electrocardiogram [ECG] [EKG]: Secondary | ICD-10-CM

## 2023-12-28 DIAGNOSIS — N170 Acute kidney failure with tubular necrosis: Secondary | ICD-10-CM | POA: Diagnosis not present

## 2023-12-28 DIAGNOSIS — I130 Hypertensive heart and chronic kidney disease with heart failure and stage 1 through stage 4 chronic kidney disease, or unspecified chronic kidney disease: Secondary | ICD-10-CM | POA: Diagnosis present

## 2023-12-28 DIAGNOSIS — J168 Pneumonia due to other specified infectious organisms: Secondary | ICD-10-CM | POA: Diagnosis not present

## 2023-12-28 DIAGNOSIS — Z833 Family history of diabetes mellitus: Secondary | ICD-10-CM

## 2023-12-28 DIAGNOSIS — Z8249 Family history of ischemic heart disease and other diseases of the circulatory system: Secondary | ICD-10-CM

## 2023-12-28 DIAGNOSIS — I509 Heart failure, unspecified: Secondary | ICD-10-CM | POA: Diagnosis not present

## 2023-12-28 DIAGNOSIS — R918 Other nonspecific abnormal finding of lung field: Secondary | ICD-10-CM | POA: Diagnosis not present

## 2023-12-28 DIAGNOSIS — Z7984 Long term (current) use of oral hypoglycemic drugs: Secondary | ICD-10-CM

## 2023-12-28 LAB — RESPIRATORY PANEL BY PCR

## 2023-12-28 LAB — COMPREHENSIVE METABOLIC PANEL
ALT: 31 U/L (ref 0–44)
AST: 34 U/L (ref 15–41)
Albumin: 3.8 g/dL (ref 3.5–5.0)
Alkaline Phosphatase: 139 U/L — ABNORMAL HIGH (ref 38–126)
Anion gap: 13 (ref 5–15)
BUN: 30 mg/dL — ABNORMAL HIGH (ref 8–23)
CO2: 22 mmol/L (ref 22–32)
Calcium: 9.6 mg/dL (ref 8.9–10.3)
Chloride: 92 mmol/L — ABNORMAL LOW (ref 98–111)
Creatinine, Ser: 1.59 mg/dL — ABNORMAL HIGH (ref 0.44–1.00)
GFR, Estimated: 33 mL/min — ABNORMAL LOW (ref >60)
Glucose, Bld: 112 mg/dL — ABNORMAL HIGH (ref 70–99)
Potassium: 4.3 mmol/L (ref 3.5–5.1)
Sodium: 127 mmol/L — ABNORMAL LOW (ref 135–145)
Total Bilirubin: 0.7 mg/dL (ref 0.0–1.2)
Total Protein: 6.5 g/dL (ref 6.5–8.1)

## 2023-12-28 LAB — CBC WITH DIFFERENTIAL/PLATELET
Abs Immature Granulocytes: 0.14 10*3/uL — ABNORMAL HIGH (ref 0.00–0.07)
Basophils Absolute: 0.1 10*3/uL (ref 0.0–0.1)
Basophils Relative: 1 %
Eosinophils Absolute: 0 10*3/uL (ref 0.0–0.5)
Eosinophils Relative: 0 %
HCT: 35.3 % — ABNORMAL LOW (ref 36.0–46.0)
Hemoglobin: 11.7 g/dL — ABNORMAL LOW (ref 12.0–15.0)
Immature Granulocytes: 1 %
Lymphocytes Relative: 4 %
Lymphs Abs: 0.5 10*3/uL — ABNORMAL LOW (ref 0.7–4.0)
MCH: 28.7 pg (ref 26.0–34.0)
MCHC: 33.1 g/dL (ref 30.0–36.0)
MCV: 86.7 fL (ref 80.0–100.0)
Monocytes Absolute: 0.8 10*3/uL (ref 0.1–1.0)
Monocytes Relative: 7 %
Neutro Abs: 9.6 10*3/uL — ABNORMAL HIGH (ref 1.7–7.7)
Neutrophils Relative %: 87 %
Platelets: 221 10*3/uL (ref 150–400)
RBC: 4.07 MIL/uL (ref 3.87–5.11)
RDW: 15.1 % (ref 11.5–15.5)
WBC: 11.1 10*3/uL — ABNORMAL HIGH (ref 4.0–10.5)
nRBC: 0 % (ref 0.0–0.2)

## 2023-12-28 LAB — I-STAT VENOUS BLOOD GAS, ED
Acid-base deficit: 4 mmol/L — ABNORMAL HIGH (ref 0.0–2.0)
Bicarbonate: 21.1 mmol/L (ref 20.0–28.0)
Calcium, Ion: 1.1 mmol/L — ABNORMAL LOW (ref 1.15–1.40)
HCT: 36 % (ref 36.0–46.0)
Hemoglobin: 12.2 g/dL (ref 12.0–15.0)
O2 Saturation: 92 %
Potassium: 4.6 mmol/L (ref 3.5–5.1)
Sodium: 125 mmol/L — ABNORMAL LOW (ref 135–145)
TCO2: 22 mmol/L (ref 22–32)
pCO2, Ven: 38.5 mm[Hg] — ABNORMAL LOW (ref 44–60)
pH, Ven: 7.347 (ref 7.25–7.43)
pO2, Ven: 67 mm[Hg] — ABNORMAL HIGH (ref 32–45)

## 2023-12-28 LAB — RESP PANEL BY RT-PCR (RSV, FLU A&B, COVID)  RVPGX2
Influenza A by PCR: NEGATIVE
Influenza B by PCR: NEGATIVE
Resp Syncytial Virus by PCR: NEGATIVE
SARS Coronavirus 2 by RT PCR: NEGATIVE

## 2023-12-28 LAB — I-STAT CG4 LACTIC ACID, ED: Lactic Acid, Venous: 1.9 mmol/L (ref 0.5–1.9)

## 2023-12-28 LAB — URIC ACID: Uric Acid, Serum: 3.1 mg/dL (ref 2.5–7.1)

## 2023-12-28 LAB — TSH: TSH: 5.477 u[IU]/mL — ABNORMAL HIGH (ref 0.350–4.500)

## 2023-12-28 LAB — MAGNESIUM: Magnesium: 1.9 mg/dL (ref 1.7–2.4)

## 2023-12-28 LAB — PROCALCITONIN: Procalcitonin: <0.1 ng/mL

## 2023-12-28 LAB — VITAMIN B12: Vitamin B-12: 535 pg/mL (ref 180–914)

## 2023-12-28 LAB — TROPONIN I (HIGH SENSITIVITY)
Troponin I (High Sensitivity): 32 ng/L — ABNORMAL HIGH (ref <18)
Troponin I (High Sensitivity): 28 ng/L — ABNORMAL HIGH (ref <18)

## 2023-12-28 LAB — PROTIME-INR
INR: 1.2 (ref 0.8–1.2)
Prothrombin Time: 15.8 s — ABNORMAL HIGH (ref 11.4–15.2)

## 2023-12-28 LAB — APTT: aPTT: 35 s (ref 24–36)

## 2023-12-28 LAB — BRAIN NATRIURETIC PEPTIDE: B Natriuretic Peptide: 1370.4 pg/mL — ABNORMAL HIGH (ref 0.0–100.0)

## 2023-12-28 MED ORDER — HYDROXYCHLOROQUINE SULFATE 200 MG PO TABS
200.0000 mg | ORAL_TABLET | Freq: Two times a day (BID) | ORAL | Status: DC
  Administered 2023-12-29 – 2024-01-03 (×11): 200 mg via ORAL
  Filled 2023-12-28 (×12): qty 1

## 2023-12-28 MED ORDER — DOXYCYCLINE HYCLATE 100 MG PO TABS
100.0000 mg | ORAL_TABLET | Freq: Two times a day (BID) | ORAL | Status: DC
  Administered 2023-12-29 – 2024-01-01 (×8): 100 mg via ORAL
  Filled 2023-12-28 (×8): qty 1

## 2023-12-28 MED ORDER — HEPARIN SODIUM (PORCINE) 5000 UNIT/ML IJ SOLN
5000.0000 [IU] | Freq: Three times a day (TID) | INTRAMUSCULAR | Status: DC
  Administered 2023-12-28 – 2024-01-02 (×14): 5000 [IU] via SUBCUTANEOUS
  Filled 2023-12-28 (×12): qty 1

## 2023-12-28 MED ORDER — ALBUTEROL SULFATE (2.5 MG/3ML) 0.083% IN NEBU
2.5000 mg | INHALATION_SOLUTION | RESPIRATORY_TRACT | Status: DC | PRN
  Administered 2023-12-29: 2.5 mg via RESPIRATORY_TRACT
  Filled 2023-12-28: qty 3

## 2023-12-28 MED ORDER — INSULIN ASPART 100 UNIT/ML IJ SOLN
0.0000 [IU] | Freq: Three times a day (TID) | INTRAMUSCULAR | Status: DC
  Administered 2023-12-29 – 2024-01-01 (×3): 1 [IU] via SUBCUTANEOUS

## 2023-12-28 MED ORDER — FUROSEMIDE 10 MG/ML IJ SOLN
40.0000 mg | Freq: Two times a day (BID) | INTRAMUSCULAR | Status: AC
  Administered 2023-12-28 – 2023-12-31 (×7): 40 mg via INTRAVENOUS
  Filled 2023-12-28 (×7): qty 4

## 2023-12-28 MED ORDER — SODIUM CHLORIDE 0.9 % IV SOLN
500.0000 mg | Freq: Once | INTRAVENOUS | Status: AC
  Administered 2023-12-28: 500 mg via INTRAVENOUS
  Filled 2023-12-28: qty 5

## 2023-12-28 MED ORDER — ALLOPURINOL 300 MG PO TABS
300.0000 mg | ORAL_TABLET | Freq: Every day | ORAL | Status: DC
  Administered 2023-12-29 – 2024-01-03 (×6): 300 mg via ORAL
  Filled 2023-12-28 (×5): qty 1
  Filled 2023-12-28: qty 3

## 2023-12-28 MED ORDER — FUROSEMIDE 10 MG/ML IJ SOLN
40.0000 mg | Freq: Once | INTRAMUSCULAR | Status: AC
  Administered 2023-12-28: 40 mg via INTRAVENOUS
  Filled 2023-12-28: qty 4

## 2023-12-28 MED ORDER — ACETAMINOPHEN 325 MG PO TABS
650.0000 mg | ORAL_TABLET | Freq: Four times a day (QID) | ORAL | Status: DC | PRN
Start: 2023-12-28 — End: 2024-01-03

## 2023-12-28 MED ORDER — ATORVASTATIN CALCIUM 40 MG PO TABS
40.0000 mg | ORAL_TABLET | Freq: Every day | ORAL | Status: DC
Start: 2023-12-29 — End: 2024-01-02
  Administered 2023-12-29 – 2024-01-02 (×5): 40 mg via ORAL
  Filled 2023-12-28 (×5): qty 1

## 2023-12-28 MED ORDER — CEFTRIAXONE SODIUM 1 G IJ SOLR
1.0000 g | INTRAMUSCULAR | Status: DC
  Administered 2023-12-29 – 2024-01-01 (×4): 1 g via INTRAVENOUS
  Filled 2023-12-28 (×4): qty 10

## 2023-12-28 MED ORDER — IPRATROPIUM-ALBUTEROL 0.5-2.5 (3) MG/3ML IN SOLN
3.0000 mL | Freq: Once | RESPIRATORY_TRACT | Status: AC
  Administered 2023-12-28: 3 mL via RESPIRATORY_TRACT
  Filled 2023-12-28: qty 3

## 2023-12-28 MED ORDER — IPRATROPIUM-ALBUTEROL 0.5-2.5 (3) MG/3ML IN SOLN
3.0000 mL | Freq: Four times a day (QID) | RESPIRATORY_TRACT | Status: AC
  Administered 2023-12-28 – 2023-12-29 (×2): 3 mL via RESPIRATORY_TRACT
  Filled 2023-12-28: qty 3

## 2023-12-28 MED ORDER — ASPIRIN 81 MG PO TBEC
81.0000 mg | DELAYED_RELEASE_TABLET | Freq: Every day | ORAL | Status: DC
Start: 2023-12-29 — End: 2024-01-02
  Administered 2023-12-29 – 2024-01-02 (×5): 81 mg via ORAL
  Filled 2023-12-28 (×5): qty 1

## 2023-12-28 MED ORDER — METHYLPREDNISOLONE SODIUM SUCC 125 MG IJ SOLR
125.0000 mg | Freq: Once | INTRAMUSCULAR | Status: AC
  Administered 2023-12-28: 125 mg via INTRAVENOUS
  Filled 2023-12-28: qty 2

## 2023-12-28 MED ORDER — ACETAMINOPHEN 650 MG RE SUPP: 650.0000 mg | Freq: Four times a day (QID) | RECTAL | Status: DC | PRN

## 2023-12-28 MED ORDER — SODIUM CHLORIDE 0.9 % IV SOLN
1.0000 g | Freq: Once | INTRAVENOUS | Status: AC
  Administered 2023-12-28: 1 g via INTRAVENOUS
  Filled 2023-12-28: qty 10

## 2023-12-28 NOTE — Assessment & Plan Note (Signed)
 Concern for hypervolemia hyponatremia  Fluid restrict, diurese and trend Check urine studies if no improvement

## 2023-12-28 NOTE — H&P (Signed)
 History and Physical    Patient: Deborah Bryant FMW:[id number] DOB: 12-20-1945 DOA: 12/28/2023 DOS: the patient was seen and examined on 12/28/2023 PCP: Johnny Garnette LABOR, MD  Patient coming from: Home - lives with her husband. Ambulates independently    Chief Complaint: shortness of breath   HPI: Deborah Bryant is a 79 y.o. female with medical history significant of COPD,  T2DM, HTN, chronic pain on opioids, OA/sero negative RA who presented to ED with complaints of worsening shortness of breath that started over a week ago and has gotten progressively worse. She states she thought it would get better, but hasn't. She has increased swelling in her legs and stomach. She also thinks she has gained weight. She has new orthopnea complaints as well. She has new dyspnea on exertion and has to take frequent breaks when walking any distance. She states she has a dry cough. No sick contacts, no fever/chills at home. Has not changed diet/eaten more salt or been sick recently. She also feels like she has been tighter in her chest and wheezy. She is intermittently confused.   Verified with husband that symptoms started a week ago. He states her legs have been swollen, this is not new.   Denies any fever/chills, vision changes/headaches, chest pain or palpitations, abdominal pain, N/V/D, dysuria   She does not smoke or drink alcohol.   ER Course:  vitals: temp: 93.8, bp: 120/78, HR: 48, RR: 16, oxygen : 92%[place]>92% 10L/min HFNC Pertinent labs: wbc: 11.1, hgb: 11.7, sodium: 127, BN: 30, creatinine: 1.59, troponin 32>delta pending, BNP: 1370,  CXR: LLL pneumonia with small left pleural effusion  In ED: given rocephin  and azithromycin , 40mg  lasix , steroids, duoneb. BC obtained. Bear hugger applied. TRH asked to admit.     Review of Systems: As mentioned in the history of present illness. All other systems reviewed and are negative. Past Medical History:  Diagnosis Date   Allergic rhinitis    Anxiety     Arthritis    sees Dr. Jon Jacob    Cellulitis, periorbital 2006   left eye, due to MRSA, saw Dr. Arlana    Chronic kidney disease    sees Dr. Curtis Heman   Complication of anesthesia    COPD (chronic obstructive pulmonary disease) (HCC)    Cough    Diabetes mellitus    Dyspnea    with exertion   GERD (gastroesophageal reflux disease)    OTC  Pepcid    Hemorrhoids    Hyperlipidemia    Hypertension    IBS (irritable bowel syndrome)    Loss of smell    Loss of taste    Memory changes    Menopause    MRSA (methicillin resistant Staphylococcus aureus) 2006   left orbit    Pneumonia    PONV (postoperative nausea and vomiting)    Tubular adenoma of colon 12/2006   Past Surgical History:  Procedure Laterality Date   APPENDECTOMY     BACK SURGERY     COLONOSCOPY  04-01-12   per Dr. Aneita, tubular adenoma, repeat in 5 yrs    CYSTOSCOPY WITH BIOPSY N/A 12/11/2021   Procedure: CYSTOSCOPY WITH BIOPSY;  Surgeon: Elisabeth Valli BIRCH, MD;  Location: [facility];  Service: Urology;  Laterality: N/A;   CYSTOSCOPY WITH FULGERATION N/A 12/11/2021   Procedure: CYSTOSCOPY WITH FULGERATION;  Surgeon: Elisabeth Valli BIRCH, MD;  Location: [facility];  Service: Urology;  Laterality: N/A;   POLYPECTOMY     TUBAL LIGATION     Social  History:  reports that she has been smoking cigarettes. She has a 0.5 pack-year smoking history. She has never used smokeless tobacco. She reports that she does not currently use alcohol. She reports that she does not use drugs.  Allergies  Allergen Reactions   Losartan  Shortness Of Breath   Benazepril  Cough   Lasix  [Furosemide ] Other (See Comments)    Rash, changes in eyesight   Levaquin [Levofloxacin] Other (See Comments)    Tendon and muscle pain   Phenobarbital Nausea And Vomiting    Family History  Problem Relation Age of Onset   Alcohol abuse Mother 109       MVA   Diabetes Mother    Heart disease Father 67       MI   Heart disease Brother    Diabetes  Brother    Arthritis Sister    Dementia Maternal Grandfather     Prior to Admission medications   Medication Sig Start Date End Date Taking? Authorizing Provider  allopurinol  (ZYLOPRIM ) 300 MG tablet Take 1 tablet (300 mg total) by mouth daily. 08/19/23  Yes Johnny Garnette LABOR, MD  Ascorbic Acid  (VITAMIN C ) 1000 MG tablet Take 1,000 mg by mouth daily.   Yes [provider]  aspirin  EC 81 MG tablet Take 81 mg by mouth daily.   Yes [provider]  atorvastatin  (LIPITOR) 40 MG tablet Take 1 tablet (40 mg total) by mouth daily. 08/19/23  Yes Johnny Garnette LABOR, MD  Cholecalciferol  (VITAMIN D ) 2000 units CAPS Take 2,000 Units by mouth daily. OTC   Yes [provider]  diazepam  (VALIUM ) 5 MG tablet Take 1-2 tablets (5-10 mg total) by mouth every 8 (eight) hours as needed for muscle spasms. 08/19/23  Yes Johnny Garnette LABOR, MD  diltiazem  (CARDIZEM  CD) 360 MG 24 hr capsule Take 1 capsule (360 mg total) by mouth daily. 08/19/23  Yes Johnny Garnette LABOR, MD  glipiZIDE  (GLUCOTROL  XL) 5 MG 24 hr tablet Take 1 tablet (5 mg total) by mouth daily with breakfast. 08/19/23  Yes Johnny Garnette LABOR, MD  HYDROcodone -acetaminophen  (NORCO) 5-325 MG tablet Take 1 tablet by mouth every 6 (six) hours as needed for moderate pain (pain score 4-6). 12/03/23  Yes Johnny Garnette LABOR, MD  hydroxychloroquine  (PLAQUENIL ) 200 MG tablet Take 200 mg by mouth 2 (two) times daily. Pt reported changed by Rheumatology   Yes [provider]  LASIX  20 MG tablet Take 1 tablet (20 mg total) by mouth 2 (two) times daily. Taking 2 tablets daily 08/19/23  Yes Johnny Garnette LABOR, MD  polyethylene glycol powder (GLYCOLAX /MIRALAX ) powder Take 8.5-17 g by mouth at bedtime.    Yes [provider]  potassium chloride  (KLOR-CON  10) 10 MEQ tablet Take 1 tablet (10 mEq total) by mouth daily. 08/26/23  Yes Johnny Garnette LABOR, MD  saccharomyces boulardii (FLORASTOR) 250 MG capsule Take 250 mg by mouth daily.   Yes [provider]    Physical  Exam: Vitals:   12/28/23 1555 12/28/23 1629 12/28/23 1855 12/28/23 1900  BP:    (!) 157/48  Pulse: (!) 47   (!) 50  Resp: 19   15  Temp:  (!) 94.7 F (34.8 C) (!) 96.4 F (35.8 C)   TempSrc:  Rectal Rectal   SpO2: 92%   96%  Weight:      Height:       General:  Appears calm and comfortable and is in NAD. Fidgety with hands  Eyes:  PERRL, EOMI, normal lids,  iris ENT:  grossly normal hearing, lips & tongue, dry mucous membranes; appropriate dentition Neck:  no LAD, masses or thyromegaly; no carotid bruits Cardiovascular:  bradycardic, regular rhythm, no m/r/g.  Respiratory:   decreased breath sounds in bilateral lower lobes . Mildly dyspneic with talking. No wheezing  Abdomen:  soft, NT, ND, NABS Back:   normal alignment, no CVAT Skin:  no rash or induration seen on limited exam Musculoskeletal:  grossly normal tone BUE/BLE, good ROM, no bony abnormality Lower extremity:  3+ pitting edema.  Limited foot exam with no ulcerations.  2+ distal pulses. Psychiatric:  grossly normal mood and affect, speech fluent and appropriate, Aox3. Intermittent confusion. Picking at things, can't work phone . Neurologic:  CN 2-12 grossly intact, moves all extremities in coordinated fashion, sensation intact   Radiological Exams on Admission: Independently reviewed - see discussion in A/P where applicable  DG Chest Port 1 View Result Date: 12/28/2023 CLINICAL DATA:  Shortness of breath and bradycardia. EXAM: PORTABLE CHEST 1 VIEW COMPARISON:  Chest x-ray dated November 02, 2018. FINDINGS: The heart size and mediastinal contours are within normal limits. Chronic interstitial coarsening is similar to prior study and likely smoking-related. Dense left lower lobe consolidation with small left pleural effusion. Clear right lung. No pneumothorax. No acute osseous abnormality. IMPRESSION: 1. Left lower lobe pneumonia with small left pleural effusion. Followup PA and lateral chest X-ray is recommended in 3-4 weeks  following trial of antibiotic therapy to ensure resolution. Electronically Signed   By: Elsie ONEIDA Shoulder M.D.   On: 12/28/2023 14:05    EKG: Independently reviewed.  Junctional rhythm with rate 50; nonspecific ST changes with no evidence of acute ischemia. Prolonged QT    Labs on Admission: I have personally reviewed the available labs and imaging studies at the time of the admission.  Pertinent labs:   wbc: 11.1,  hgb: 11.7,  sodium: 127,  BUN: 30,  creatinine: 1.59,  troponin 32>delta pending,  BNP: 1370  Assessment and Plan: Principal Problem:   Acute respiratory failure with hypoxia (HCC) Active Problems:   Acute on chronic diastolic CHF (congestive heart failure) (HCC)   Acute renal failure superimposed on stage 2 chronic kidney disease (HCC)   Hypothermia   Prolonged QT interval   Acute encephalopathy   Left lower lobe pneumonia   Hyponatremia   Essential hypertension   Diabetes mellitus without complication (HCC)   Chronic pain   Seronegative inflammatory arthritis   Hyperlipemia   Gout    Assessment and Plan: * Acute respiratory failure with hypoxia (HCC) 78 year old presenting to ED with one week history of worsening shortness of breath and cough found to have oxygen  to 89% and needing 10L HFNC -admit to progressive -likely multifactorial in setting of CHF exacerbation, hypothermia and possibly pneumonia  -check VBG, RVP -continue coverage for pneumonia with PCT pending  -treat CHF exacerbation -new junctional rhythm/CHF consulted cardiology   -no tachycardia, consider CTA if no improvement and renal function improves. PE low on differential. Well's score of zero  -hold norco     Acute on chronic diastolic CHF (congestive heart failure) (HCC) Presenting with acute respiratory failure, volume overload, CXR showing pleural effusion, weight gain, DOE and orthopnea clinically indicates she is in acute on chronic diastolic CHF exacerbation  -given 40mg  of lasix   in ED, increase to 40mg  BID -check echo (last echo in 9/24 with normal EF, grade 1 DD)  -troponin flat, minimally elevated and likely troponin leak in setting of hypoxia  -  strict I/O -daily weights  -consulted cardiology. Concern with  new junctional rhythm, hypothermia, AKI and hypoxia that she is having poor cardiac output. Confirmed with husband no extra medication  -RVP pending   Hypothermia Unsure why she is hypothermic.  -? Infectious:  LLL pneumonia read on CXR but favor more fluid overload, urine still pending  -concern for poor cardiac output in setting of CHF exacerbation -CT head pending  -TSH pending, glucose normal  -UDS pending  -continue bear hugger until normothermic   Acute renal failure superimposed on stage 2 chronic kidney disease (HCC) Creatinine baseline <1.0 Presenting with creatinine of 1.59 Concern for hypoperfusion in setting of CHF exacerbation  UA still pending  Strict I/O Avoid nephrotoxic drugs Diurese and monitor renal function closely  Trend   Acute encephalopathy Intermittently confused Likely combination of hypothermia, hypoxia, possibly infectious CT  head pending  Continue coverage for pneumonia, follow PCT/RVP and urine  Check metabolic labs/UDS  Check VBG Fall precautions   Prolonged QT interval ? Secondary to hypothermia  Optimize electrolytes Keep on telemetry Avoid qt prolonging drugs  Repeat ekg in AM    Left lower lobe pneumonia CXR read as LLL pneumonia; however, I wonder if this is more effusion She does have hypothermia, but no other SIRS criteria  Continue coverage with rocephin  and change to doxy with QT prolongation  Trend PCT, check RVP  Urinary antigens Albuterol  PRN and schedule x 2 duonebs  IS to bedside  Trend CBC (mild leukocytosis at 11.1, but this appears to be her baseline)   Hyponatremia Concern for hypervolemia hyponatremia  Fluid restrict, diurese and trend Check urine studies if no improvement     Essential hypertension Hold cardizem  with bradycardia and junctional rhythm  Avoid beta blockers  F/u cards recs  Will monitor for now   Diabetes mellitus without complication (HCC) A1C of 6.4 in June 2024 Hold glipizide  SSI and accuchecks QAC/HS   Chronic pain Hold norco until respiratory status and cognition improved UDS pending   Seronegative inflammatory arthritis Followed by rheumatology Continue plaquenil   Hold norco until more alert   Hyperlipemia Continue lipitor 40mg    Gout Check uric acid Continue allopurinol       Advance Care Planning:   Code Status: Full Code   Consults: cardiology: Dr. Terrence and PT   DVT Prophylaxis: heparin  Clarinda   Family Communication: updated  husband by phone   Severity of Illness: The appropriate patient status for this patient is INPATIENT. Inpatient status is judged to be reasonable and necessary in order to provide the required intensity of service to ensure the patient's safety. The patient's presenting symptoms, physical exam findings, and initial radiographic and laboratory data in the context of their chronic comorbidities is felt to place them at high risk for further clinical deterioration. Furthermore, it is not anticipated that the patient will be medically stable for discharge from the hospital within 2 midnights of admission.   * I certify that at the point of admission it is my clinical judgment that the patient will require inpatient hospital care spanning beyond 2 midnights from the point of admission due to high intensity of service, high risk for further deterioration and high frequency of surveillance required.*  Author: Isaiah Geralds, MD 12/28/2023 7:31 PM  For on call review [email or web address].

## 2023-12-28 NOTE — Assessment & Plan Note (Addendum)
 Intermittently confused Swallow screen  Likely combination of hypothermia, hypoxia, possibly infectious CT  head pending  Continue coverage for pneumonia, follow PCT/RVP and urine  Check metabolic labs/UDS  Check VBG Hold valium  and norco, appears to be taking 90 pills of valium /month  Fall precautions

## 2023-12-28 NOTE — Assessment & Plan Note (Addendum)
 Unsure why she is hypothermic.  -? Infectious:  LLL pneumonia read on CXR but favor more fluid overload, urine still pending  -concern for poor cardiac output in setting of CHF exacerbation -CT head pending  -TSH pending, glucose normal  -UDS pending  -continue bear hugger until normothermic

## 2023-12-28 NOTE — Assessment & Plan Note (Signed)
Check uric acid Continue allopurinol.  

## 2023-12-28 NOTE — Assessment & Plan Note (Addendum)
 Hold cardizem with bradycardia and junctional rhythm  Avoid beta blockers  F/u cards recs  Will monitor for now

## 2023-12-28 NOTE — Assessment & Plan Note (Signed)
 Hold norco until respiratory status and cognition improved UDS pending

## 2023-12-28 NOTE — Assessment & Plan Note (Addendum)
?   Secondary to hypothermia  Optimize electrolytes Keep on telemetry Avoid qt prolonging drugs  Repeat ekg in AM

## 2023-12-28 NOTE — Progress Notes (Signed)
 Patient placed on 10L [place] high flow nasal cannula due to sats of 89% sustaining.  Sats currently at 92%.  Will continue to monitor.

## 2023-12-28 NOTE — ED Provider Notes (Signed)
  Physical Exam  BP (!) 146/109   Pulse (!) 47 Comment: Simultaneous filing. User may not have seen previous data.  Temp (!) 94.7 F (34.8 C) (Rectal)   Resp 19 Comment: Simultaneous filing. User may not have seen previous data.  Ht 5' 2 (1.575 m)   Wt 84.8 kg   SpO2 92% Comment: Simultaneous filing. User may not have seen previous data.  BMI 34.20 kg/m   Physical Exam Vitals and nursing note reviewed.  HENT:     Head: Normocephalic and atraumatic.  Eyes:     Pupils: Pupils are equal, round, and reactive to light.  Cardiovascular:     Rate and Rhythm: Normal rate and regular rhythm.  Pulmonary:     Effort: Pulmonary effort is normal.     Breath sounds: Normal breath sounds.  Abdominal:     Palpations: Abdomen is soft.     Tenderness: There is no abdominal tenderness.  Skin:    General: Skin is warm and dry.  Neurological:     Mental Status: She is alert.  Psychiatric:        Mood and Affect: Mood normal.     Procedures  Procedures  ED Course / MDM   Clinical Course as of 12/28/23 1657  Sun Dec 28, 2023  1418 CXR with LLL pneumonia. Will be started on antibiotics. Still wheezing on exam and will be given additional neb and steroids. Currently on 6L [place], will require admission for hypoxic respiratory failure. [VK]  1511 Mild increased Cr from baseline, BNP 1300, will be given IV lasix  for volume overload. Mildly elevated troponin. Patient signed out to Dr. Pamella pending admission. [VK]  1556 Discussed with admitting hospitalist accepts patient for admission.  Patient resting comfortably on her phone at this time remains on high flow nasal cannula [MP]    Clinical Course User Index [MP] Pamella Ozell LABOR, DO [VK] Kingsley, Victoria K, DO   Medical Decision Making I, Ozell Pamella DO, have assumed care of this patient from the previous provider pending admission  Amount and/or Complexity of Data Reviewed Labs: ordered. Radiology: ordered.  Risk Prescription drug  management. Decision regarding hospitalization.          Pamella Ozell LABOR, DO 12/28/23 1657

## 2023-12-28 NOTE — Assessment & Plan Note (Signed)
 Creatinine baseline <1.0 Presenting with creatinine of 1.59 Concern for hypoperfusion in setting of CHF exacerbation  UA still pending  Strict I/O Avoid nephrotoxic drugs Diurese and monitor renal function closely  Trend

## 2023-12-28 NOTE — ED Triage Notes (Signed)
 Pt BIB [facility] from home d/t SOB & bradycardia. Pt was hypoxic at 86% on RA, was 89% on 2L via n/c & 100% with NRB. Heart rate was in the 40's & 39 bpm at times, was given 3 mg Atropine in 18g Lt AC PIV & it rose to 50's & some low 60's. Did have wheezing upon arrival, was given total of 1 mg Atrovent & 10 mg Albuterol , then she was having rales with no more wheezing. A/Ox4, 12 lead unremarkable, 116/80.

## 2023-12-28 NOTE — ED Provider Notes (Signed)
 [facility] EMERGENC[facility] DEPARTMENT AT [facility] Provider Note   CSN: [id number] Arrival date & time: 12/28/23  1243     History  Chief Complaint  Patient presents with   Shortness of Breath   Bradycardia    Deborah Bryant is a 78 y.o. female.  Patient is a 78 year old female with a past medical history of COPD on home O2, CKD, diabetes, hypertension presenting to the emergency department with weakness and shortness of breath.  The patient states over the last 1 to 2 weeks she has felt increasingly weak and fatigued as well as short of breath.  She reports shortness of breath both on exertion and while at rest.  She states that she has had an associated nonproductive cough.  She states that she has been nauseous with decreased appetite.  Denies any chest or abdominal pain, denies any diarrhea.  She denies any fevers.  She was found to be bradycardic and hypoxic by EMS, placed on nonrebreather.  Was given atropine for bradycardia, rates were in the 40s and improved to the 50s to 60s.  The history is provided by the patient and the EMS personnel.  Shortness of Breath      Home Medications Prior to Admission medications   Medication Sig Start Date End Date Taking? Authorizing Provider  allopurinol  (ZYLOPRIM ) 300 MG tablet Take 1 tablet (300 mg total) by mouth daily. 08/19/23   Johnny Garnette LABOR, MD  Ascorbic Acid  (VITAMIN C ) 1000 MG tablet Take 1,000 mg by mouth daily.    [provider]  aspirin  EC 81 MG tablet Take 81 mg by mouth daily.    [provider]  atorvastatin  (LIPITOR) 40 MG tablet Take 1 tablet (40 mg total) by mouth daily. 08/19/23   Johnny Garnette LABOR, MD  Cholecalciferol  (VITAMIN D ) 2000 units CAPS Take 2,000 Units by mouth daily. OTC    [provider]  diazepam  (VALIUM ) 5 MG tablet Take 1-2 tablets (5-10 mg total) by mouth every 8 (eight) hours as needed for muscle spasms. 08/19/23   Johnny Garnette LABOR, MD  diltiazem  (CARDIZEM  CD) 360 MG 24 hr  capsule Take 1 capsule (360 mg total) by mouth daily. 08/19/23   Johnny Garnette LABOR, MD  famotidine  (PEPCID ) 10 MG tablet Take 10 mg by mouth 2 (two) times daily.    [provider]  glipiZIDE  (GLUCOTROL  XL) 5 MG 24 hr tablet Take 1 tablet (5 mg total) by mouth daily with breakfast. 08/19/23   Johnny Garnette LABOR, MD  HYDROcodone -acetaminophen  (NORCO) 5-325 MG tablet Take 1 tablet by mouth every 6 (six) hours as needed for moderate pain (pain score 4-6). 12/03/23   Johnny Garnette LABOR, MD  HYDROcodone -acetaminophen  (NORCO) 5-325 MG tablet Take 1 tablet by mouth every 6 (six) hours as needed for moderate pain (pain score 4-6). 12/03/23   Johnny Garnette LABOR, MD  HYDROcodone -acetaminophen  (NORCO) 5-325 MG tablet Take 1 tablet by mouth every 6 (six) hours as needed for moderate pain (pain score 4-6). 12/03/23   Johnny Garnette LABOR, MD  hydroxychloroquine  (PLAQUENIL ) 200 MG tablet Take 200 mg by mouth 2 (two) times daily. Pt reported changed by Rheumatology    [provider]  LASIX  20 MG tablet Take 1 tablet (20 mg total) by mouth 2 (two) times daily. Taking 2 tablets daily 08/19/23   Johnny Garnette LABOR, MD  polyethylene glycol powder (GLYCOLAX /MIRALAX ) powder Take 8.5-17 g by mouth at bedtime.     [provider]  potassium chloride  (KLOR-CON   10) 10 MEQ tablet Take 1 tablet (10 mEq total) by mouth daily. 08/26/23   Johnny Garnette LABOR, MD  saccharomyces boulardii (FLORASTOR) 250 MG capsule Take 250 mg by mouth daily.    [provider]      Allergies    Losartan , Benazepril , Lasix  [furosemide ], Levaquin [levofloxacin], and Phenobarbital    Review of Systems   Review of Systems  Respiratory:  Positive for shortness of breath.     Physical Exam Updated Vital Signs BP (!) 129/100   Pulse (!) 43   Temp (!) 93.8 F (34.3 C) (Oral)   Resp 15   Ht 5' 2 (1.575 m)   Wt 84.8 kg   SpO2 93%   BMI 34.20 kg/m  Physical Exam Vitals and nursing note reviewed.  Constitutional:      General: She is not  in acute distress.    Appearance: She is well-developed. She is ill-appearing.  HENT:     Head: Normocephalic.     Mouth/Throat:     Mouth: Mucous membranes are moist.     Pharynx: Oropharynx is clear.  Eyes:     Extraocular Movements: Extraocular movements intact.  Cardiovascular:     Rate and Rhythm: Regular rhythm. Bradycardia present.     Pulses: Normal pulses.     Heart sounds: Normal heart sounds.  Pulmonary:     Effort: Pulmonary effort is normal.     Breath sounds: Wheezing (Diffuse expiratory) and rales (At the bases) present.  Abdominal:     Palpations: Abdomen is soft.     Tenderness: There is no abdominal tenderness.  Musculoskeletal:        General: Normal range of motion.     Cervical back: Normal range of motion and neck supple.     Right lower leg: Edema (2+) present.     Left lower leg: Edema (2+) present.  Skin:    General: Skin is warm and dry.  Neurological:     General: No focal deficit present.     Mental Status: She is alert and oriented to person, place, and time.  Psychiatric:        Mood and Affect: Mood normal.        Behavior: Behavior normal.     ED Results / Procedures / Treatments   Labs (all labs ordered are listed, but only abnormal results are displayed) Labs Reviewed  COMPREHENSIVE METABOLIC PANEL - Abnormal; Notable for the following components:      Result Value   Sodium 127 (*)    Chloride 92 (*)    Glucose, Bld 112 (*)    BUN 30 (*)    Creatinine, Ser 1.59 (*)    Alkaline Phosphatase 139 (*)    GFR, Estimated 33 (*)    All other components within normal limits  CBC WITH DIFFERENTIAL/PLATELET - Abnormal; Notable for the following components:   WBC 11.1 (*)    Hemoglobin 11.7 (*)    HCT 35.3 (*)    Neutro Abs 9.6 (*)    Lymphs Abs 0.5 (*)    Abs Immature Granulocytes 0.14 (*)    All other components within normal limits  BRAIN NATRIURETIC PEPTIDE - Abnormal; Notable for the following components:   B Natriuretic Peptide  1,370.4 (*)    All other components within normal limits  PROTIME-INR - Abnormal; Notable for the following components:   Prothrombin Time 15.8 (*)    All other components within normal limits  TROPONIN I (HIGH SENSITIVITY) - Abnormal;  Notable for the following components:   Troponin I (High Sensitivity) 32 (*)    All other components within normal limits  RESP PANEL BY RT-PCR (RSV, FLU A&B, COVID)  RVPGX2  CULTURE, BLOOD (ROUTINE X 2)  CULTURE, BLOOD (ROUTINE X 2)  APTT  URINALYSIS, W/ REFLEX TO CULTURE (INFECTION SUSPECTED)  I-STAT CG4 LACTIC ACID, ED  TROPONIN I (HIGH SENSITIVITY)    EKG EKG Interpretation Date/Time:  Sunday December 28 2023 12:55:26 EST Ventricular Rate:  50 PR Interval:    QRS Duration:  94 QT Interval:  585 QTC Calculation: 534 R Axis:   37  Text Interpretation: Junctional rhythm Low voltage, extremity leads Consider anterior infarct Borderline repolarization abnormality Prolonged QT interval Rhythm now appears junctional though somewhat limited due to baseline artifact compared to prior EKG Confirmed by Ellouise Fine ([id number]) on 12/28/2023 12:59:31 PM  Radiology DG Chest Port 1 View Result Date: 12/28/2023 CLINICAL DATA:  Shortness of breath and bradycardia. EXAM: PORTABLE CHEST 1 VIEW COMPARISON:  Chest x-ray dated November 02, 2018. FINDINGS: The heart size and mediastinal contours are within normal limits. Chronic interstitial coarsening is similar to prior study and likely smoking-related. Dense left lower lobe consolidation with small left pleural effusion. Clear right lung. No pneumothorax. No acute osseous abnormality. IMPRESSION: 1. Left lower lobe pneumonia with small left pleural effusion. Followup PA and lateral chest X-ray is recommended in 3-4 weeks following trial of antibiotic therapy to ensure resolution. Electronically Signed   By: Elsie ONEIDA Shoulder M.D.   On: 12/28/2023 14:05    Procedures .Critical Care  Performed by: Kingsley, Panayiota Larkin K,  DO Authorized by: Ellouise Fine POUR, DO   Critical care provider statement:    Critical care time (minutes):  30   Critical care was necessary to treat or prevent imminent or life-threatening deterioration of the following conditions:  Respiratory failure and sepsis   Critical care was time spent personally by me on the following activities:  Development of treatment plan with patient or surrogate, discussions with consultants, evaluation of patient's response to treatment, examination of patient, ordering and review of laboratory studies, ordering and review of radiographic studies, ordering and performing treatments and interventions, pulse oximetry, re-evaluation of patient's condition and review of old charts     Medications Ordered in ED Medications  azithromycin  (ZITHROMAX ) 500 mg in sodium chloride  0.9 % 250 mL IVPB (500 mg Intravenous New Bag/Given 12/28/23 1429)  furosemide  (LASIX ) injection 40 mg (has no administration in time range)  ipratropium-albuterol  (DUONEB) 0.5-2.5 (3) MG/3ML nebulizer solution 3 mL (3 mLs Nebulization Given 12/28/23 1409)  cefTRIAXone  (ROCEPHIN ) 1 g in sodium chloride  0.9 % 100 mL IVPB (0 g Intravenous Stopped 12/28/23 1502)  ipratropium-albuterol  (DUONEB) 0.5-2.5 (3) MG/3ML nebulizer solution 3 mL (3 mLs Nebulization Given 12/28/23 1449)  methylPREDNISolone  sodium succinate (SOLU-MEDROL ) 125 mg/2 mL injection 125 mg (125 mg Intravenous Given 12/28/23 1450)    ED Course/ Medical Decision Making/ A&P Clinical Course as of 12/28/23 1512  Sun Dec 28, 2023  1418 CXR with LLL pneumonia. Will be started on antibiotics. Still wheezing on exam and will be given additional neb and steroids. Currently on 6L [place], will require admission for hypoxic respiratory failure. [VK]  1511 Mild increased Cr from baseline, BNP 1300, will be given IV lasix  for volume overload. Mildly elevated troponin. Patient signed out to Dr. Pamella pending admission. [VK]    Clinical Course User  Index [VK] Kingsley, Karynn Deblasi K, DO  Medical Decision Making This patient presents to the ED with chief complaint(s) of shortness of breath, weakness with pertinent past medical history of COPD, hypertension, diabetes, CKD which further complicates the presenting complaint. The complaint involves an extensive differential diagnosis and also carries with it a high risk of complications and morbidity.    The differential diagnosis includes sepsis, arrhythmia, anemia, pneumonia, pneumothorax, pulmonary edema, pleural effusion, COPD exacerbation, viral syndrome, dehydration, electrolyte abnormality, new onset CHF  Additional history obtained: Additional history obtained from EMS  Records reviewed Primary Care Documents -normal nuclear stress test and echo in September 2024  ED Course and Reassessment: On patient's arrival she was bradycardic and hypoxic as well as hypothermic to 93.8.  The patient had EKG on arrival that shows likely junctional bradycardia though has some baseline artifact, does not appear significantly changed from her last EKG.  She was initially transition to 4 L nasal cannula and was desatting to the mid 80s and was increased to 5 L while I was in the room.  She does have some wheezing as well as some edema and crackles concerning for COPD versus volume overload.  In the setting of hypothermia concern for possible sepsis.  She will be placed on a Bair hugger and will have chest x-ray and labs performed and she will be closely reassessed.  Independent labs interpretation:  The following labs were independently interpreted: mild leukocytosis, AKI, elevated BNP, mildly elevated troponin  Independent visualization of imaging: - I independently visualized the following imaging with scope of interpretation limited to determining acute life threatening conditions related to emergency care: CXR, which revealed LLL pneumonia with pleural  effusion    Amount and/or Complexity of Data Reviewed Labs: ordered. Radiology: ordered.  Risk Prescription drug management.          Final Clinical Impression(s) / ED Diagnoses Final diagnoses:  Acute respiratory failure with hypoxia (HCC)  Pneumonia of left lower lobe due to infectious organism  COPD exacerbation (HCC)  Hypervolemia, unspecified hypervolemia type  AKI (acute kidney injury) [facility])    Rx / DC Orders ED Discharge Orders     None         Kingsley, Zackery Brine K, DO 12/28/23 1512

## 2023-12-28 NOTE — Assessment & Plan Note (Signed)
 A1C of 6.4 in June 2024 Hold glipizide SSI and accuchecks QAC/HS

## 2023-12-28 NOTE — ED Notes (Signed)
 Pt on the bedpan for the 3rd attempt to urinate, her pants are dry.

## 2023-12-28 NOTE — Consult Note (Signed)
 Cardiology Consultation   Patient ID: JALISE ZAWISTOWSKI MRN: [id number]; DOB: 1946/02/25  Admit date: 12/28/2023 Date of Consult: 12/28/2023  PCP:  Johnny Garnette LABOR, MD   [facility] Providers Cardiologist:  None        Patient Profile:   Deborah Bryant is a 78 y.o. female with a hx of HTN, DM2, and chronic pain on opioids who is being seen 12/28/2023 for the evaluation of hypoxia, bradycardia, and altered mental status.  History of Present Illness:   86F with HTN and DM2 presents for dyspnea. Nephew was at bedside and helped provide some history. She lives with her husband and at baseline she is independent. She was in her normal state of health until several months ago on 08/19/23 she was evaluated by PCP for lower extremity edema. TTE showed normal EF, mildly dilated LV size, normal RV size and function, and normal IVC size. NST was also performed 'for evaluation of CHF' and showed normal perfusion. She was started on lasix . Diltiazem  dose was increased due to BP above goal. Over the past week she has had worsening dyspnea, orthopnea, and lower extremity edema. She reports a dry cough. Her daughter has been sick with respiratory illness over the past 2 weeks. She has not had any fevers. No chest pain, syncope, or falls. She is able to answer some questions but appears confused. Her nephew reports this is an acute change in mental status for her. Bedside echo shows at most borderline / mildly reduced EF, no regional wall motion abnormalities, normal RV size and function, no significant MR, AI, or AS (max velocity <2 m/s), no pericardial effusion, and IVC size 2.1 cm with <50% collapse. EKG on admission showed junctional bradycardia at a rate of 50 bpm. Telemetry now shows sinus bradycardia with a rate of around 50 bpm. She takes her own medications but does not believe she took extra doses of any meds. Of not she is hypothermic and on 10 L [place]. Cr is elevated 1.59 from normal baseline. Lactate  is negative. LFTs normal. Sodium is low 127. Troponin 32 - 28. BNP 1370. WBC slightly elevated 11.1. CXR shows LLL consolidation. Bladder scan shows 100 ml.    Past Medical History:  Diagnosis Date   Allergic rhinitis    Anxiety    Arthritis    sees Dr. Jon Jacob    Cellulitis, periorbital 2006   left eye, due to MRSA, saw Dr. Arlana    Chronic kidney disease    sees Dr. Curtis Heman   Complication of anesthesia    COPD (chronic obstructive pulmonary disease) (HCC)    Cough    Diabetes mellitus    Dyspnea    with exertion   GERD (gastroesophageal reflux disease)    OTC  Pepcid    Hemorrhoids    Hyperlipidemia    Hypertension    IBS (irritable bowel syndrome)    Loss of smell    Loss of taste    Memory changes    Menopause    MRSA (methicillin resistant Staphylococcus aureus) 2006   left orbit    Pneumonia    PONV (postoperative nausea and vomiting)    Tubular adenoma of colon 12/2006    Past Surgical History:  Procedure Laterality Date   APPENDECTOMY     BACK SURGERY     COLONOSCOPY  04-01-12   per Dr. Aneita, tubular adenoma, repeat in 5 yrs    CYSTOSCOPY WITH BIOPSY N/A 12/11/2021   Procedure: CYSTOSCOPY  WITH BIOPSY;  Surgeon: Elisabeth Valli BIRCH, MD;  Location: [facility];  Service: Urology;  Laterality: N/A;   CYSTOSCOPY WITH FULGERATION N/A 12/11/2021   Procedure: CYSTOSCOPY WITH FULGERATION;  Surgeon: Elisabeth Valli BIRCH, MD;  Location: [facility];  Service: Urology;  Laterality: N/A;   POLYPECTOMY     TUBAL LIGATION       Home Medications:  Prior to Admission medications   Medication Sig Start Date End Date Taking? Authorizing Provider  allopurinol  (ZYLOPRIM ) 300 MG tablet Take 1 tablet (300 mg total) by mouth daily. 08/19/23  Yes Johnny Garnette LABOR, MD  Ascorbic Acid  (VITAMIN C ) 1000 MG tablet Take 1,000 mg by mouth daily.   Yes [provider]  aspirin  EC 81 MG tablet Take 81 mg by mouth daily.   Yes [provider]  atorvastatin  (LIPITOR) 40  MG tablet Take 1 tablet (40 mg total) by mouth daily. 08/19/23  Yes Johnny Garnette LABOR, MD  Cholecalciferol  (VITAMIN D ) 2000 units CAPS Take 2,000 Units by mouth daily. OTC   Yes [provider]  diazepam  (VALIUM ) 5 MG tablet Take 1-2 tablets (5-10 mg total) by mouth every 8 (eight) hours as needed for muscle spasms. 08/19/23  Yes Johnny Garnette LABOR, MD  diltiazem  (CARDIZEM  CD) 360 MG 24 hr capsule Take 1 capsule (360 mg total) by mouth daily. 08/19/23  Yes Johnny Garnette LABOR, MD  glipiZIDE  (GLUCOTROL  XL) 5 MG 24 hr tablet Take 1 tablet (5 mg total) by mouth daily with breakfast. 08/19/23  Yes Johnny Garnette LABOR, MD  HYDROcodone -acetaminophen  (NORCO) 5-325 MG tablet Take 1 tablet by mouth every 6 (six) hours as needed for moderate pain (pain score 4-6). 12/03/23  Yes Johnny Garnette LABOR, MD  hydroxychloroquine  (PLAQUENIL ) 200 MG tablet Take 200 mg by mouth 2 (two) times daily. Pt reported changed by Rheumatology   Yes [provider]  LASIX  20 MG tablet Take 1 tablet (20 mg total) by mouth 2 (two) times daily. Taking 2 tablets daily 08/19/23  Yes Johnny Garnette LABOR, MD  polyethylene glycol powder (GLYCOLAX /MIRALAX ) powder Take 8.5-17 g by mouth at bedtime.    Yes [provider]  potassium chloride  (KLOR-CON  10) 10 MEQ tablet Take 1 tablet (10 mEq total) by mouth daily. 08/26/23  Yes Johnny Garnette LABOR, MD  saccharomyces boulardii (FLORASTOR) 250 MG capsule Take 250 mg by mouth daily.   Yes [provider]    Inpatient Medications: Scheduled Meds:  [START ON 12/29/2023] doxycycline   100 mg Oral Q12H   furosemide   40 mg Intravenous Q12H   heparin   5,000 Units Subcutaneous Q8H   [START ON 12/29/2023] insulin  aspart  0-9 Units Subcutaneous TID WC   ipratropium-albuterol   3 mL Nebulization Q6H   Continuous Infusions:  [START ON 12/29/2023] cefTRIAXone  (ROCEPHIN )  IV     PRN Meds: acetaminophen  **OR** acetaminophen , albuterol   Allergies:    Allergies  Allergen Reactions   Losartan  Shortness Of  Breath   Benazepril  Cough   Lasix  [Furosemide ] Other (See Comments)    Rash, changes in eyesight   Levaquin [Levofloxacin] Other (See Comments)    Tendon and muscle pain   Phenobarbital Nausea And Vomiting    Social History:   Social History   Socioeconomic History   Marital status: Married    Spouse name: Tom   Number of children: 1   Years of education: college   Highest education level: Bachelor's degree (e.g., BA, AB, BS)  Occupational History    Employer: RETIRED  Tobacco Use  Smoking status: Every Day    Current packs/day: 0.50    Average packs/day: 0.5 packs/day for 1 year (0.5 ttl pk-yrs)    Types: Cigarettes   Smokeless tobacco: Never  Vaping Use   Vaping status: Never Used  Substance and Sexual Activity   Alcohol use: Not Currently    Comment: noen sinc e2019   Drug use: Never   Sexual activity: Yes    Birth control/protection: Post-menopausal  Other Topics Concern   Not on file  Social History Narrative   Patient is right-handed.she lives with her husband in a one level home. She drinks decaf coffee, 2 cups a day. One cup iced teas per day. She goes to the gym 3 x a week and is doing P/T 1 x a week.     Social Drivers of Corporate Investment Banker Strain: Not on file  Food Insecurity: No Food Insecurity (12/28/2023)   Hunger Vital Sign    Worried About Running Out of Food in the Last Year: Never true    Ran Out of Food in the Last Year: Never true  Transportation Needs: No Transportation Needs (12/28/2023)   PRAPARE - Administrator, Civil Service (Medical): No    Lack of Transportation (Non-Medical): No  Physical Activity: Not on file  Stress: Not on file  Social Connections: Moderately Isolated (12/28/2023)   Social Connection and Isolation Panel [NHANES]    Frequency of Communication with Friends and Family: More than three times a week    Frequency of Social Gatherings with Friends and Family: Never    Attends Religious Services: Never     Database Administrator or Organizations: No    Attends Banker Meetings: Never    Marital Status: Married  Catering Manager Violence: Not At Risk (12/28/2023)   Humiliation, Afraid, Rape, and Kick questionnaire    Fear of Current or Ex-Partner: No    Emotionally Abused: No    Physically Abused: No    Sexually Abused: No    Family History:   Family History  Problem Relation Age of Onset   Alcohol abuse Mother 72       MVA   Diabetes Mother    Heart disease Father 18       MI   Heart disease Brother    Diabetes Brother    Arthritis Sister    Dementia Maternal Grandfather      ROS:  Please see the history of present illness.  All other ROS reviewed and negative.     Physical Exam/Data:   Vitals:   12/28/23 1555 12/28/23 1629 12/28/23 1855 12/28/23 1900  BP:    (!) 157/48  Pulse: (!) 47   (!) 50  Resp: 19   15  Temp:  (!) 94.7 F (34.8 C) (!) 96.4 F (35.8 C)   TempSrc:  Rectal Rectal   SpO2: 92%   96%  Weight:      Height:       No intake or output data in the 24 hours ending 12/28/23 1929    12/28/2023    1:14 PM 12/03/2023   10:10 AM 09/09/2023    1:29 PM  Last 3 Weights  Weight (lbs) 187 lb 187 lb 183 lb  Weight (kg) 84.823 kg 84.823 kg 83.008 kg     Body mass index is 34.2 kg/m.  General:  Well nourished, well developed, in no acute distress HEENT: normal Neck: + JVD Vascular: Distal pulses 2+ bilaterally  Cardiac:  normal S1, S2; regular rhythm, bradycardic; no murmur  Lungs:  bibasilar crackles, no wheezing, rhonchi or rales  Abd: soft, nontender, no hepatomegaly  Ext: 2+ pitting edema bilaterally Musculoskeletal:  No deformities Skin: warm and dry  Neuro:  Alert, AOx1 Psych:  Normal affect    Laboratory Data:  High Sensitivity Troponin:   Recent Labs  Lab 12/28/23 1359 12/28/23 1552  TROPONINIHS 32* 28*     Chemistry Recent Labs  Lab 12/28/23 1359  NA 127*  K 4.3  CL 92*  CO2 22  GLUCOSE 112*  BUN 30*  CREATININE  1.59*  CALCIUM  9.6  GFRNONAA 33*  ANIONGAP 13    Recent Labs  Lab 12/28/23 1359  PROT 6.5  ALBUMIN 3.8  AST 34  ALT 31  ALKPHOS 139*  BILITOT 0.7   Lipids No results for input(s): CHOL, TRIG, HDL, LABVLDL, LDLCALC, CHOLHDL in the last 168 hours.  Hematology Recent Labs  Lab 12/28/23 1359  WBC 11.1*  RBC 4.07  HGB 11.7*  HCT 35.3*  MCV 86.7  MCH 28.7  MCHC 33.1  RDW 15.1  PLT 221   Thyroid  No results for input(s): TSH, FREET4 in the last 168 hours.  BNP Recent Labs  Lab 12/28/23 1359  BNP 1,370.4*    DDimer No results for input(s): DDIMER in the last 168 hours.   Radiology/Studies:  DG Chest Port 1 View Result Date: 12/28/2023 CLINICAL DATA:  Shortness of breath and bradycardia. EXAM: PORTABLE CHEST 1 VIEW COMPARISON:  Chest x-ray dated November 02, 2018. FINDINGS: The heart size and mediastinal contours are within normal limits. Chronic interstitial coarsening is similar to prior study and likely smoking-related. Dense left lower lobe consolidation with small left pleural effusion. Clear right lung. No pneumothorax. No acute osseous abnormality. IMPRESSION: 1. Left lower lobe pneumonia with small left pleural effusion. Followup PA and lateral chest X-ray is recommended in 3-4 weeks following trial of antibiotic therapy to ensure resolution. Electronically Signed   By: Elsie ONEIDA Shoulder M.D.   On: 12/28/2023 14:05     Assessment and Plan:  40F with HTN, DM2, chronic pain on opioids who presents with hypoxia, bradycardia, and altered mental status. Hypoxia likely combination of pneumonia (hypothermia, leukocytosis, consolidation on CXR, and sick contact) as well as HFpEF exacerbation (elevated BNP, dilated IVC, lower extremity edema). AKI is likely cardiorenal and hyponatremia is due to hypervolemia. Bradycardia is most likely medication induced, either she could not tolerate the increased dose of diltiazem  or she accidentally took more than prescribed.  Negative lactate and normal LFTs are reassuring to rule out hypoperfusion. Normal RV size on bedside echo as well as lack of risk factors makes PE less likely.   HF exacerbation - recommend formal TTE tomorrow - did not respond to IV lasix  40 mg, recommend increasing to 100 mg and target net negative 2-3 L per day  Bradycardia - discontinue diltiazem   - recommend non AV nodal blocking agents for BP control   Risk Assessment/Risk Scores:        [place]  Heart Association (NYHA) Functional Class NYHA Class III        For questions or updates, please contact [facility] Please consult [email or web address] for contact info under    Signed, Eliezer GORMAN Sjogren  12/28/2023 7:29 PM

## 2023-12-28 NOTE — Assessment & Plan Note (Addendum)
 Followed by rheumatology Continue plaquenil  Hold norco until more alert

## 2023-12-28 NOTE — Assessment & Plan Note (Signed)
Continue lipitor 40mg  

## 2023-12-28 NOTE — Assessment & Plan Note (Addendum)
 78 year old presenting to ED with one week history of worsening shortness of breath and cough found to have oxygen  to 89% and needing 10L HFNC -admit to progressive -likely multifactorial in setting of CHF exacerbation, hypothermia and possibly pneumonia  -check VBG, RVP -continue coverage for pneumonia with PCT pending  -treat CHF exacerbation -new junctional rhythm/CHF consulted cardiology   -no tachycardia, consider CTA if no improvement and renal function improves. PE low on differential. Well's score of zero  -hold norco

## 2023-12-28 NOTE — Assessment & Plan Note (Addendum)
 CXR read as LLL pneumonia; however, I wonder if this is more effusion She does have hypothermia, but no other SIRS criteria  Continue coverage with rocephin  and change to doxy with QT prolongation  Trend PCT, check RVP  Urinary antigens Albuterol  PRN and schedule x 2 duonebs  IS to bedside  Trend CBC (mild leukocytosis at 11.1, but this appears to be her baseline)

## 2023-12-28 NOTE — ED Notes (Signed)
 Admitting at bedside

## 2023-12-28 NOTE — Assessment & Plan Note (Addendum)
 Presenting with acute respiratory failure, volume overload, CXR showing pleural effusion, weight gain, DOE and orthopnea clinically indicates she is in acute on chronic diastolic CHF exacerbation  -given 40mg  of lasix  in ED, increase to 40mg  BID -check echo (last echo in 9/24 with normal EF, grade 1 DD)  -troponin flat, minimally elevated and likely troponin leak in setting of hypoxia  -strict I/O -daily weights  -consulted cardiology. Concern with  new junctional rhythm, hypothermia, AKI and hypoxia that she is having poor cardiac output. Confirmed with husband no extra medication  -RVP pending

## 2023-12-29 ENCOUNTER — Inpatient Hospital Stay (HOSPITAL_COMMUNITY): Payer: Medicare Other

## 2023-12-29 DIAGNOSIS — I5021 Acute systolic (congestive) heart failure: Secondary | ICD-10-CM | POA: Diagnosis not present

## 2023-12-29 DIAGNOSIS — I5033 Acute on chronic diastolic (congestive) heart failure: Secondary | ICD-10-CM | POA: Diagnosis not present

## 2023-12-29 DIAGNOSIS — J189 Pneumonia, unspecified organism: Secondary | ICD-10-CM | POA: Diagnosis not present

## 2023-12-29 DIAGNOSIS — J9601 Acute respiratory failure with hypoxia: Secondary | ICD-10-CM | POA: Diagnosis not present

## 2023-12-29 LAB — ECHOCARDIOGRAM COMPLETE
Area-P 1/2: 4.57 cm2
Height: 62 in
S' Lateral: 3.5 cm
Weight: 2992 [oz_av]

## 2023-12-29 LAB — RAPID URINE DRUG SCREEN, HOSP PERFORMED
Amphetamines: NOT DETECTED
Barbiturates: NOT DETECTED
Benzodiazepines: POSITIVE — AB
Cocaine: NOT DETECTED
Opiates: POSITIVE — AB
Tetrahydrocannabinol: NOT DETECTED

## 2023-12-29 LAB — URINALYSIS, W/ REFLEX TO CULTURE (INFECTION SUSPECTED)
Bilirubin Urine: NEGATIVE
Glucose, UA: NEGATIVE mg/dL
Hgb urine dipstick: NEGATIVE
Ketones, ur: NEGATIVE mg/dL
Nitrite: NEGATIVE
Protein, ur: 30 mg/dL — AB
Specific Gravity, Urine: 1.008 (ref 1.005–1.030)
pH: 5 (ref 5.0–8.0)

## 2023-12-29 LAB — GLUCOSE, CAPILLARY
Glucose-Capillary: 136 mg/dL — ABNORMAL HIGH (ref 70–99)
Glucose-Capillary: 138 mg/dL — ABNORMAL HIGH (ref 70–99)

## 2023-12-29 LAB — CBC
HCT: 35 % — ABNORMAL LOW (ref 36.0–46.0)
Hemoglobin: 11.6 g/dL — ABNORMAL LOW (ref 12.0–15.0)
MCH: 28.5 pg (ref 26.0–34.0)
MCHC: 33.1 g/dL (ref 30.0–36.0)
MCV: 86 fL (ref 80.0–100.0)
Platelets: 215 10*3/uL (ref 150–400)
RBC: 4.07 MIL/uL (ref 3.87–5.11)
RDW: 15.5 % (ref 11.5–15.5)
WBC: 9.8 10*3/uL (ref 4.0–10.5)
nRBC: 0 % (ref 0.0–0.2)

## 2023-12-29 LAB — BASIC METABOLIC PANEL
Anion gap: 14 (ref 5–15)
BUN: 27 mg/dL — ABNORMAL HIGH (ref 8–23)
CO2: 21 mmol/L — ABNORMAL LOW (ref 22–32)
Calcium: 9.6 mg/dL (ref 8.9–10.3)
Chloride: 93 mmol/L — ABNORMAL LOW (ref 98–111)
Creatinine, Ser: 1.2 mg/dL — ABNORMAL HIGH (ref 0.44–1.00)
GFR, Estimated: 47 mL/min — ABNORMAL LOW (ref >60)
Glucose, Bld: 112 mg/dL — ABNORMAL HIGH (ref 70–99)
Potassium: 4.1 mmol/L (ref 3.5–5.1)
Sodium: 128 mmol/L — ABNORMAL LOW (ref 135–145)

## 2023-12-29 LAB — PROCALCITONIN: Procalcitonin: <0.1 ng/mL

## 2023-12-29 LAB — CBG MONITORING, ED
Glucose-Capillary: 118 mg/dL — ABNORMAL HIGH (ref 70–99)
Glucose-Capillary: 118 mg/dL — ABNORMAL HIGH (ref 70–99)

## 2023-12-29 MED ORDER — HYDROCODONE-ACETAMINOPHEN 5-325 MG PO TABS
1.0000 | ORAL_TABLET | Freq: Four times a day (QID) | ORAL | Status: DC | PRN
  Administered 2023-12-30 – 2024-01-03 (×5): 1 via ORAL
  Filled 2023-12-29 (×5): qty 1

## 2023-12-29 MED ORDER — DIAZEPAM 2 MG PO TABS
2.0000 mg | ORAL_TABLET | Freq: Three times a day (TID) | ORAL | Status: DC | PRN
  Administered 2023-12-29 – 2024-01-03 (×2): 2 mg via ORAL
  Filled 2023-12-29 (×2): qty 1

## 2023-12-29 NOTE — ED Notes (Signed)
 Attempting to obtain BP on patient but patient is restless and will not stop moving

## 2023-12-29 NOTE — Evaluation (Signed)
 Physical Therapy Evaluation Patient Details Name: Deborah Bryant MRN: [id number] DOB: July 02, 1946 Today's Date: 12/29/2023  History of Present Illness  Pt is 78 yo female who presents on 12/28/23 with hypoxia, bradycardia, and AMS. PMH: pneumonia, MRSA 2006, HTN, HLD, GERD, DM, dyspnea, COPD, CKD, L periorbital cellulitis, L4-5 fusion, chronic pain  Clinical Impression  Pt admitted with above diagnosis. Pt from home with husband where she is usually independent, drives, shops, cooks, etc. She is not on O2 at baseline. Today pt with 10L supplemental O2 via [place] to keeps sats in 90's. Pt also confused, pulling at lines and leads and cannot give day or date. This is not her normal but husband reports began a few days ago. Pt needed mod A to come to edge of stretcher. Pt with posterior lean in sitting requiring min A to remain upright. Given that pt's feet did not reach floor from stretcher was unsafe to have her stand after being given Valium  and with current mental status. PT will follow acutely and anticipate that she will be able to go home with HHPT. However, if AMS does not improve will need to consider post acute options.  Pt currently with functional limitations due to the deficits listed below (see PT Problem List). Pt will benefit from acute skilled PT to increase their independence and safety with mobility to allow discharge.           If plan is discharge home, recommend the following: A little help with walking and/or transfers;A little help with bathing/dressing/bathroom;Assist for transportation;Assistance with cooking/housework   Can travel by private vehicle        Equipment Recommendations None recommended by PT  Recommendations for Other Services  OT consult    Functional Status Assessment Patient has had a recent decline in their functional status and demonstrates the ability to make significant improvements in function in a reasonable and predictable amount of time.      Precautions / Restrictions Precautions Precautions: Other (comment) Precaution Comments: watch O2 sats Restrictions Weight Bearing Restrictions Per Provider Order: No      Mobility  Bed Mobility Overal bed mobility: Needs Assistance Bed Mobility: Supine to Sit, Sit to Supine     Supine to sit: Mod assist Sit to supine: Min assist   General bed mobility comments: mod A for initiating LE's off EOB and elevation of trunk into sitting. Pt with posterior lean but able to wt shift fwd when cued with reaching. Min A to LE's to return to supine    Transfers                   General transfer comment: not tested from stretcher as pt lethargic from [facility]    Ambulation/Gait               General Gait Details: not tested  Stairs            Wheelchair Mobility     Tilt Bed    Modified Rankin (Stroke Patients Only)       Balance Overall balance assessment: Needs assistance Sitting-balance support: Bilateral upper extremity supported, Feet unsupported Sitting balance-Leahy Scale: Poor Sitting balance - Comments: posterior lean, min A needed to remain erect Postural control: Posterior lean                                   Pertinent Vitals/Pain Pain Assessment Pain Assessment: Faces Faces Pain  Scale: Hurts a little bit Pain Location: back Pain Descriptors / Indicators: Aching Pain Intervention(s): Limited activity within patient's tolerance, Monitored during session    Home Living Family/patient expects to be discharged to:: Private residence Living Arrangements: Spouse/significant other Available Help at Discharge: Family;Available PRN/intermittently Type of Home: House Home Access: Stairs to enter   Entrance Stairs-Number of Steps: 1   Home Layout: One level Home Equipment: Grab bars - tub/shower;Rolling Walker (2 wheels);Cane - single point;Shower seat - built in Additional Comments: pt lives with husband. Nephew helps them  as needed, he was present on eval    Prior Function Prior Level of Function : Independent/Modified Independent;Driving             Mobility Comments: does not usually use an AD, independent ADLs Comments: independent     Extremity/Trunk Assessment   Upper Extremity Assessment Upper Extremity Assessment: Defer to OT evaluation    Lower Extremity Assessment Lower Extremity Assessment: Generalized weakness    Cervical / Trunk Assessment Cervical / Trunk Assessment: Kyphotic  Communication   Communication Communication: No apparent difficulties Cueing Techniques: Verbal cues;Gestural cues;Tactile cues;Visual cues  Cognition Arousal: Lethargic, Suspect due to medications Behavior During Therapy: Restless Overall Cognitive Status: Impaired/Different from baseline Area of Impairment: Orientation, Attention, Memory, Following commands, Safety/judgement, Awareness, Problem solving                 Orientation Level: Disoriented to, Time, Situation Current Attention Level: Focused Memory: Decreased short-term memory Following Commands: Follows one step commands inconsistently Safety/Judgement: Decreased awareness of deficits, Decreased awareness of safety Awareness: Intellectual Problem Solving: Difficulty sequencing, Requires verbal cues, Requires tactile cues General Comments: pt confused on eval, husband reports that this began at home several days ago, possibly worse not too from recent dose of Vallium. Pulling on wires, confused about why they are there. Cannot give day or date, does not really understand the question        General Comments General comments (skin integrity, edema, etc.): SPO2 100% on 10L O2, pt not on O2 at baseline. HR in 60's.    Exercises     Assessment/Plan    PT Assessment Patient needs continued PT services  PT Problem List Decreased cognition;Decreased mobility;Decreased balance;Decreased activity tolerance;Decreased strength;Decreased  range of motion;Decreased coordination;Decreased knowledge of use of DME;Decreased safety awareness;Decreased knowledge of precautions;Cardiopulmonary status limiting activity       PT Treatment Interventions DME instruction;Gait training;Functional mobility training;Therapeutic activities;Balance training;Therapeutic exercise;Patient/family education;Cognitive remediation;Neuromuscular re-education    PT Goals (Current goals can be found in the Care Plan section)  Acute Rehab PT Goals Patient Stated Goal: return home PT Goal Formulation: With patient/family Time For Goal Achievement: 01/12/24 Potential to Achieve Goals: Good    Frequency Min 1X/week     Co-evaluation               AM-PAC PT 6 Clicks Mobility  Outcome Measure Help needed turning from your back to your side while in a flat bed without using bedrails?: A Lot Help needed moving from lying on your back to sitting on the side of a flat bed without using bedrails?: A Lot Help needed moving to and from a bed to a chair (including a wheelchair)?: A Lot Help needed standing up from a chair using your arms (e.g., wheelchair or bedside chair)?: Total Help needed to walk in hospital room?: Total Help needed climbing 3-5 steps with a railing? : Total 6 Click Score: 9    End of Session Equipment Utilized  During Treatment: Oxygen  Activity Tolerance: Patient tolerated treatment well Patient left: in bed;with call bell/phone within reach;with family/visitor present Nurse Communication: Mobility status PT Visit Diagnosis: Muscle weakness (generalized) (M62.81);Difficulty in walking, not elsewhere classified (R26.2)    Time: 8788-8763 PT Time Calculation (min) (ACUTE ONLY): 25 min   Charges:   PT Evaluation $PT Eval Moderate Complexity: 1 Mod PT Treatments $Therapeutic Activity: 8-22 mins PT General Charges $$ ACUTE PT VISIT: 1 Visit         Richerd Lipoma, PT  Acute Rehab Services Secure chat  preferred Office [phone]   Richerd CROME Breccan Galant 12/29/2023, 1:45 PM

## 2023-12-29 NOTE — Plan of Care (Signed)
   Problem: Activity: Goal: Ability to tolerate increased activity will improve Outcome: Progressing

## 2023-12-29 NOTE — Progress Notes (Signed)
 DAILY PROGRESS NOTE   Patient Name: Deborah Bryant Date of Encounter: 12/29/2023 Cardiologist: None  Chief Complaint   I feel about the same  Patient Profile   Deborah Bryant is a 78 y.o. female with a hx of HTN, DM2, and chronic pain on opioids who is being seen 12/28/2023 for the evaluation of hypoxia, bradycardia, and altered mental status   Subjective   Not much improvement overnight- says she is peeing a lot but nothing recorded, she is incontinent-  no purwick. Formal echo is pending today. HR improved, now in the 60's. Creatinine improved today with diuresis - now 1.2 (from 1.59).  Objective   Vitals:   12/29/23 0314 12/29/23 0414 12/29/23 0415 12/29/23 0727  BP:  125/62 (!) 141/61 (!) 166/97  Pulse: 62 (!) 57 (!) 55 66  Resp: 20 18 17 16   Temp:  (!) 96.1 F (35.6 C)    TempSrc:  Axillary  Oral  SpO2: 92% 92% 97% 99%  Weight:      Height:       No intake or output data in the 24 hours ending 12/29/23 0835 Filed Weights   12/28/23 1314  Weight: 84.8 kg    Physical Exam   General appearance: alert, no distress, and moderately obese Neck: JVD - 3 cm above sternal notch, no carotid bruit, and thyroid  not enlarged, symmetric, no tenderness/mass/nodules Lungs: diminished breath sounds bilaterally, rales bibasilar, and wheezes bilaterally Heart: regular rate and rhythm Abdomen: soft, non-tender; bowel sounds normal; no masses,  no organomegaly Extremities: edema trace sockline bilaterally Pulses: 2+ and symmetric Skin: Skin color, texture, turgor normal. No rashes or lesions Neurologic: Mental status: Alert, oriented, thought content appropriate Psych: Pleasant  Inpatient Medications    Scheduled Meds:  allopurinol   300 mg Oral Daily   aspirin  EC  81 mg Oral Daily   atorvastatin   40 mg Oral Daily   doxycycline   100 mg Oral Q12H   furosemide   40 mg Intravenous Q12H   heparin   5,000 Units Subcutaneous Q8H   hydroxychloroquine   200 mg Oral BID   insulin   aspart  0-9 Units Subcutaneous TID WC    Continuous Infusions:  cefTRIAXone  (ROCEPHIN )  IV      PRN Meds: acetaminophen  **OR** acetaminophen , albuterol , diazepam , HYDROcodone -acetaminophen    Labs   Results for orders placed or performed during the hospital encounter of 12/28/23 (from the past 48 hours)  Comprehensive metabolic panel     Status: Abnormal   Collection Time: 12/28/23  1:59 PM  Result Value Ref Range   Sodium 127 (L) 135 - 145 mmol/L   Potassium 4.3 3.5 - 5.1 mmol/L   Chloride 92 (L) 98 - 111 mmol/L   CO2 22 22 - 32 mmol/L   Glucose, Bld 112 (H) 70 - 99 mg/dL    Comment: Glucose reference range applies only to samples taken after fasting for at least 8 hours.   BUN 30 (H) 8 - 23 mg/dL   Creatinine, Ser 8.40 (H) 0.44 - 1.00 mg/dL   Calcium  9.6 8.9 - 10.3 mg/dL   Total Protein 6.5 6.5 - 8.1 g/dL   Albumin 3.8 3.5 - 5.0 g/dL   AST 34 15 - 41 U/L   ALT 31 0 - 44 U/L   Alkaline Phosphatase 139 (H) 38 - 126 U/L   Total Bilirubin 0.7 0.0 - 1.2 mg/dL   GFR, Estimated 33 (L) >60 mL/min    Comment: (NOTE) Calculated using the CKD-EPI Creatinine Equation (2021)  Anion gap 13 5 - 15    Comment: Performed at [facility], [address] [place], [place] [address]  CBC with Differential     Status: Abnormal   Collection Time: 12/28/23  1:59 PM  Result Value Ref Range   WBC 11.1 (H) 4.0 - 10.5 K/uL   RBC 4.07 3.87 - 5.11 MIL/uL   Hemoglobin 11.7 (L) 12.0 - 15.0 g/dL   HCT 64.6 (L) 63.9 - 53.9 %   MCV 86.7 80.0 - 100.0 fL   MCH 28.7 26.0 - 34.0 pg   MCHC 33.1 30.0 - 36.0 g/dL   RDW 84.8 88.4 - 84.4 %   Platelets 221 150 - 400 K/uL   nRBC 0.0 0.0 - 0.2 %   Neutrophils Relative % 87 %   Neutro Abs 9.6 (H) 1.7 - 7.7 K/uL   Lymphocytes Relative 4 %   Lymphs Abs 0.5 (L) 0.7 - 4.0 K/uL   Monocytes Relative 7 %   Monocytes Absolute 0.8 0.1 - 1.0 K/uL   Eosinophils Relative 0 %   Eosinophils Absolute 0.0 0.0 - 0.5 K/uL   Basophils Relative 1 %   Basophils  Absolute 0.1 0.0 - 0.1 K/uL   Immature Granulocytes 1 %   Abs Immature Granulocytes 0.14 (H) 0.00 - 0.07 K/uL    Comment: Performed at [facility], [address], [place], [place] [address]  Troponin I (High Sensitivity)     Status: Abnormal   Collection Time: 12/28/23  1:59 PM  Result Value Ref Range   Troponin I (High Sensitivity) 32 (H) <18 ng/L    Comment: (NOTE) Elevated high sensitivity troponin I (hsTnI) values and significant  changes across serial measurements may suggest ACS but many other  chronic and acute conditions are known to elevate hsTnI results.  Refer to the Links section for chest pain algorithms and additional  guidance. Performed at [facility], [address], [place], [place] [address]   Brain natriuretic peptide     Status: Abnormal   Collection Time: 12/28/23  1:59 PM  Result Value Ref Range   B Natriuretic Peptide 1,370.4 (H) 0.0 - 100.0 pg/mL    Comment: Performed at [facility], [address], [place], [place] [address]  Resp panel by RT-PCR (RSV, Flu A&B, Covid) Anterior Nasal Swab     Status: None   Collection Time: 12/28/23  1:59 PM   Specimen: Anterior Nasal Swab  Result Value Ref Range   SARS Coronavirus 2 by RT PCR NEGATIVE NEGATIVE   Influenza A by PCR NEGATIVE NEGATIVE   Influenza B by PCR NEGATIVE NEGATIVE    Comment: (NOTE) The Xpert Xpress SARS-CoV-2/FLU/RSV plus assay is intended as an aid in the diagnosis of influenza from Nasopharyngeal swab specimens and should not be used as a sole basis for treatment. Nasal washings and aspirates are unacceptable for Xpert Xpress SARS-CoV-2/FLU/RSV testing.  Fact Sheet for Patients: bloggercourse.com  Fact Sheet for Healthcare Providers: seriousbroker.it  This test is not yet approved or cleared by the [place]  FDA and has been authorized for detection and/or diagnosis of SARS-CoV-2 by FDA under an Emergency Use  Authorization (EUA). This EUA will remain in effect (meaning this test can be used) for the duration of the COVID-19 declaration under Section 564(b)(1) of the Act, 21 U.S.C. section 360bbb-3(b)(1), unless the authorization is terminated or revoked.     Resp Syncytial Virus by PCR NEGATIVE NEGATIVE    Comment: (NOTE) Fact  Sheet for Patients: bloggercourse.com  Fact Sheet for Healthcare Providers: seriousbroker.it  This test is not yet approved or cleared by the [place]  FDA and has been authorized for detection and/or diagnosis of SARS-CoV-2 by FDA under an Emergency Use Authorization (EUA). This EUA will remain in effect (meaning this test can be used) for the duration of the COVID-19 declaration under Section 564(b)(1) of the Act, 21 U.S.C. section 360bbb-3(b)(1), unless the authorization is terminated or revoked.  Performed at [facility], [address], [place], [place] [address]   Protime-INR     Status: Abnormal   Collection Time: 12/28/23  1:59 PM  Result Value Ref Range   Prothrombin Time 15.8 (H) 11.4 - 15.2 seconds   INR 1.2 0.8 - 1.2    Comment: (NOTE) INR goal varies based on device and disease states. Performed at [facility], [address], [place], [place] [address]   APTT     Status: None   Collection Time: 12/28/23  1:59 PM  Result Value Ref Range   aPTT 35 24 - 36 seconds    Comment: Performed at [facility], [address] [place], [place] [address]  Respiratory (~20 pathogens) panel by PCR     Status: None   Collection Time: 12/28/23  1:59 PM   Specimen: Nasopharyngeal Swab; Respiratory  Result Value Ref Range   Adenovirus NOT DETECTED NOT DETECTED   Coronavirus 229E NOT DETECTED NOT DETECTED    Comment: (NOTE) The Coronavirus on the Respiratory Panel, DOES NOT test for the novel  Coronavirus (2019 nCoV)    Coronavirus HKU1 NOT DETECTED NOT DETECTED   Coronavirus NL63  NOT DETECTED NOT DETECTED   Coronavirus OC43 NOT DETECTED NOT DETECTED   Metapneumovirus NOT DETECTED NOT DETECTED   Rhinovirus / Enterovirus NOT DETECTED NOT DETECTED   Influenza A NOT DETECTED NOT DETECTED   Influenza B NOT DETECTED NOT DETECTED   Parainfluenza Virus 1 NOT DETECTED NOT DETECTED   Parainfluenza Virus 2 NOT DETECTED NOT DETECTED   Parainfluenza Virus 3 NOT DETECTED NOT DETECTED   Parainfluenza Virus 4 NOT DETECTED NOT DETECTED   Respiratory Syncytial Virus NOT DETECTED NOT DETECTED   Bordetella pertussis NOT DETECTED NOT DETECTED   Bordetella Parapertussis NOT DETECTED NOT DETECTED   Chlamydophila pneumoniae NOT DETECTED NOT DETECTED   Mycoplasma pneumoniae NOT DETECTED NOT DETECTED    Comment: Performed at [facility], [address], [place], [place] [address]  Blood Culture (routine x 2)     Status: None (Preliminary result)   Collection Time: 12/28/23  2:16 PM   Specimen: BLOOD  Result Value Ref Range   Specimen Description BLOOD BLOOD RIGHT FOREARM    Special Requests      BOTTLES DRAWN AEROBIC AND ANAEROBIC Blood Culture results may not be optimal due to an inadequate volume of blood received in culture bottles   Culture      NO GROWTH < 24 HOURS Performed at [facility], [address] [place], [place] [address]    Report Status PENDING   Blood Culture (routine x 2)     Status: None (Preliminary result)   Collection Time: 12/28/23  2:17 PM   Specimen: BLOOD  Result Value Ref Range   Specimen Description BLOOD BLOOD LEFT HAND    Special Requests      BOTTLES DRAWN AEROBIC AND ANAEROBIC Blood Culture results may not be optimal due to an inadequate volume of blood received  in culture bottles   Culture      NO GROWTH < 24 HOURS Performed at [facility], [address], [place], [place] [address]    Report Status PENDING   I-Stat Lactic Acid, ED     Status: None   Collection Time: 12/28/23  2:25 PM  Result Value Ref Range   Lactic  Acid, Venous 1.9 0.5 - 1.9 mmol/L  Troponin I (High Sensitivity)     Status: Abnormal   Collection Time: 12/28/23  3:52 PM  Result Value Ref Range   Troponin I (High Sensitivity) 28 (H) <18 ng/L    Comment: (NOTE) Elevated high sensitivity troponin I (hsTnI) values and significant  changes across serial measurements may suggest ACS but many other  chronic and acute conditions are known to elevate hsTnI results.  Refer to the Links section for chest pain algorithms and additional  guidance. Performed at [facility], [address] [place], [place] [address]   Procalcitonin     Status: None   Collection Time: 12/28/23  3:52 PM  Result Value Ref Range   Procalcitonin <0.10 ng/mL    Comment:        Interpretation: PCT (Procalcitonin) <= 0.5 ng/mL: Systemic infection (sepsis) is not likely. Local bacterial infection is possible. (NOTE)       Sepsis PCT Algorithm           Lower Respiratory Tract                                      Infection PCT Algorithm    ----------------------------     ----------------------------         PCT < 0.25 ng/mL                PCT < 0.10 ng/mL          Strongly encourage             Strongly discourage   discontinuation of antibiotics    initiation of antibiotics    ----------------------------     -----------------------------       PCT 0.25 - 0.50 ng/mL            PCT 0.10 - 0.25 ng/mL               OR       >80% decrease in PCT            Discourage initiation of                                            antibiotics      Encourage discontinuation           of antibiotics    ----------------------------     -----------------------------         PCT >= 0.50 ng/mL              PCT 0.26 - 0.50 ng/mL               AND        <80% decrease in PCT             Encourage initiation of  antibiotics       Encourage continuation           of antibiotics    ----------------------------      -----------------------------        PCT >= 0.50 ng/mL                  PCT > 0.50 ng/mL               AND         increase in PCT                  Strongly encourage                                      initiation of antibiotics    Strongly encourage escalation           of antibiotics                                     -----------------------------                                           PCT <= 0.25 ng/mL                                                 OR                                        > 80% decrease in PCT                                      Discontinue / Do not initiate                                             antibiotics  Performed at [facility], [address], [place], [place] [address]   Magnesium      Status: None   Collection Time: 12/28/23  5:46 PM  Result Value Ref Range   Magnesium  1.9 1.7 - 2.4 mg/dL    Comment: Performed at [facility], [address], [place], [place] [address]  Uric acid     Status: None   Collection Time: 12/28/23  5:46 PM  Result Value Ref Range   Uric Acid, Serum 3.1 2.5 - 7.1 mg/dL    Comment: HEMOLYSIS AT THIS LEVEL MAY AFFECT RESULT Performed at [facility], [address], [place], [place] [address]   TSH     Status: Abnormal   Collection Time: 12/28/23  5:46 PM  Result Value Ref Range   TSH 5.477 (H) 0.350 - 4.500 uIU/mL    Comment: Performed by a 3rd Generation assay with a functional sensitivity of <=0.01 uIU/mL. Performed at [facility],  [address], [place], [place] [address]   Vitamin B12     Status: None   Collection Time: 12/28/23  7:20 PM  Result Value Ref Range   Vitamin B-12 535 180 - 914 pg/mL    Comment: (NOTE) This assay is not validated for testing neonatal or myeloproliferative syndrome specimens for Vitamin B12 levels. Performed at [facility], [address] [place], [place] [address]   I-Stat venous blood gas, ED     Status: Abnormal   Collection Time:  12/28/23  7:39 PM  Result Value Ref Range   pH, Ven 7.347 7.25 - 7.43   pCO2, Ven 38.5 (L) 44 - 60 mmHg   pO2, Ven 67 (H) 32 - 45 mmHg   Bicarbonate 21.1 20.0 - 28.0 mmol/L   TCO2 22 22 - 32 mmol/L   O2 Saturation 92 %   Acid-base deficit 4.0 (H) 0.0 - 2.0 mmol/L   Sodium 125 (L) 135 - 145 mmol/L   Potassium 4.6 3.5 - 5.1 mmol/L   Calcium , Ion 1.10 (L) 1.15 - 1.40 mmol/L   HCT 36.0 36.0 - 46.0 %   Hemoglobin 12.2 12.0 - 15.0 g/dL   Sample type VENOUS   Urinalysis, w/ Reflex to Culture (Infection Suspected) -Urine, Clean Catch     Status: Abnormal   Collection Time: 12/29/23  1:04 AM  Result Value Ref Range   Specimen Source URINE, CLEAN CATCH    Color, Urine YELLOW YELLOW   APPearance HAZY (A) CLEAR   Specific Gravity, Urine 1.008 1.005 - 1.030   pH 5.0 5.0 - 8.0   Glucose, UA NEGATIVE NEGATIVE mg/dL   Hgb urine dipstick NEGATIVE NEGATIVE   Bilirubin Urine NEGATIVE NEGATIVE   Ketones, ur NEGATIVE NEGATIVE mg/dL   Protein, ur 30 (A) NEGATIVE mg/dL   Nitrite NEGATIVE NEGATIVE   Leukocytes,Ua MODERATE (A) NEGATIVE   RBC / HPF 0-5 0 - 5 RBC/hpf   WBC, UA 0-5 0 - 5 WBC/hpf    Comment:        Reflex urine culture not performed if WBC <=10, OR if Squamous epithelial cells >5. If Squamous epithelial cells >5 suggest recollection.    Bacteria, UA RARE (A) NONE SEEN   Squamous Epithelial / HPF 0-5 0 - 5 /HPF    Comment: Performed at [facility], [address], [place], [place] [address]  Rapid urine drug screen (hospital performed)     Status: Abnormal   Collection Time: 12/29/23  1:04 AM  Result Value Ref Range   Opiates POSITIVE (A) NONE DETECTED   Cocaine NONE DETECTED NONE DETECTED   Benzodiazepines POSITIVE (A) NONE DETECTED   Amphetamines NONE DETECTED NONE DETECTED   Tetrahydrocannabinol NONE DETECTED NONE DETECTED   Barbiturates NONE DETECTED NONE DETECTED    Comment: (NOTE) DRUG SCREEN FOR MEDICAL PURPOSES ONLY.  IF CONFIRMATION IS NEEDED FOR ANY PURPOSE,  NOTIFY LAB WITHIN 5 DAYS.  LOWEST DETECTABLE LIMITS FOR URINE DRUG SCREEN Drug Class                     Cutoff (ng/mL) Amphetamine and metabolites    1000 Barbiturate and metabolites    200 Benzodiazepine                 200 Opiates and metabolites        300 Cocaine and metabolites        300 THC  50 Performed at [facility], [address], [place], [place] [address]   Basic metabolic panel     Status: Abnormal   Collection Time: 12/29/23  4:56 AM  Result Value Ref Range   Sodium 128 (L) 135 - 145 mmol/L   Potassium 4.1 3.5 - 5.1 mmol/L   Chloride 93 (L) 98 - 111 mmol/L   CO2 21 (L) 22 - 32 mmol/L   Glucose, Bld 112 (H) 70 - 99 mg/dL    Comment: Glucose reference range applies only to samples taken after fasting for at least 8 hours.   BUN 27 (H) 8 - 23 mg/dL   Creatinine, Ser 8.79 (H) 0.44 - 1.00 mg/dL   Calcium  9.6 8.9 - 10.3 mg/dL   GFR, Estimated 47 (L) >60 mL/min    Comment: (NOTE) Calculated using the CKD-EPI Creatinine Equation (2021)    Anion gap 14 5 - 15    Comment: Performed at [facility], [address], [place], [place] [address]  CBC     Status: Abnormal   Collection Time: 12/29/23  4:56 AM  Result Value Ref Range   WBC 9.8 4.0 - 10.5 K/uL   RBC 4.07 3.87 - 5.11 MIL/uL   Hemoglobin 11.6 (L) 12.0 - 15.0 g/dL   HCT 64.9 (L) 63.9 - 53.9 %   MCV 86.0 80.0 - 100.0 fL   MCH 28.5 26.0 - 34.0 pg   MCHC 33.1 30.0 - 36.0 g/dL   RDW 84.4 88.4 - 84.4 %   Platelets 215 150 - 400 K/uL   nRBC 0.0 0.0 - 0.2 %    Comment: Performed at [facility], [address], [place], [place] [address]    ECG   N/A  Telemetry   Sinus rhythm - Personally Reviewed  Radiology    CT HEAD WO CONTRAST ( ) Result Date: 12/28/2023 CLINICAL DATA:  Delirium. EXAM: CT HEAD WITHOUT CONTRAST TECHNIQUE: Contiguous axial images were obtained from the base of the skull through the vertex without intravenous contrast. RADIATION DOSE  REDUCTION: This exam was performed according to the departmental dose-optimization program which includes automated exposure control, adjustment of the mA and/or kV according to patient size and/or use of iterative reconstruction technique. COMPARISON:  Brain MRI 11/01/2018 FINDINGS: Brain: No intracranial hemorrhage, mass effect, or midline shift. Brain volume is normal for age. No hydrocephalus. The basilar cisterns are patent. Mild periventricular and deep white matter hypodensity typical of chronic small vessel ischemia. No evidence of territorial infarct or acute ischemia. No extra-axial or intracranial fluid collection. Vascular: No hyperdense vessel or unexpected calcification. Skull: No fracture or focal lesion. Sinuses/Orbits: Paranasal sinuses and mastoid air cells are clear. The visualized orbits are unremarkable. Other: None. IMPRESSION: 1.  No acute intracranial abnormality. 2. Mild for age chronic small vessel ischemic change. Electronically Signed   By: Andrea Gasman M.D.   On: 12/28/2023 20:29   DG Chest Port 1 View Result Date: 12/28/2023 CLINICAL DATA:  Shortness of breath and bradycardia. EXAM: PORTABLE CHEST 1 VIEW COMPARISON:  Chest x-ray dated November 02, 2018. FINDINGS: The heart size and mediastinal contours are within normal limits. Chronic interstitial coarsening is similar to prior study and likely smoking-related. Dense left lower lobe consolidation with small left pleural effusion. Clear right lung. No pneumothorax. No acute osseous abnormality. IMPRESSION: 1. Left lower lobe pneumonia with small left pleural effusion. Followup PA and lateral chest X-ray is recommended in 3-4 weeks following trial of antibiotic therapy to  ensure resolution. Electronically Signed   By: Elsie ONEIDA Shoulder M.D.   On: 12/28/2023 14:05    Cardiac Studies   Echo pending  Assessment   Principal Problem:   Acute respiratory failure with hypoxia (HCC) Active Problems:   Diabetes mellitus without  complication (HCC)   Hyperlipemia   Gout   Essential hypertension   Acute renal failure superimposed on stage 2 chronic kidney disease (HCC)   Seronegative inflammatory arthritis   Hyponatremia   Chronic pain   Prolonged QT interval   Acute on chronic diastolic CHF (congestive heart failure) (HCC)   Left lower lobe pneumonia   Acute encephalopathy   Hypothermia   Plan   She reports her breathing is not much improved overnight, however, she has peed a lot - incontinent, not able to get up to the bedpan. Would place purewick. Need to measure accurate I's/O's - creatinine improved. HR improved. Plan for formal echo today. Continue IV lasix  BID.  Time Spent Directly with Patient:  I have spent a total of 25 minutes with the patient reviewing hospital notes, telemetry, EKGs, labs and examining the patient as well as establishing an assessment and plan that was discussed personally with the patient.  > 50% of time was spent in direct patient care.  Length of Stay:  LOS: 1 day   Vinie KYM Maxcy, MD, [facility], FACP  [place]  [facility]  Medical Director of the [facility] Lipid [facility]  Cardiovascular Risk Reduction Clinic Diplomate of the American Board of Clinical Lipidology Attending Cardiologist  Direct Dial: [phone]  Fax: [phone]  Website:  [email or web address] Vinie JAYSON Maxcy 12/29/2023, 8:35 AM

## 2023-12-29 NOTE — Progress Notes (Signed)
 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  Also complained of orthopnea and dyspnea on exertion.  In the emergency room temperature 93.8, blood pressure stable.  Heart rate 48.  Oxygen  saturation less than 90%, required 10 L high flow nasal cannula oxygen  to keep saturation more than 92%.  WBC count normal.  Hemoglobin normal.  Sodium 127, creatinine 1.59, troponin 32.  BNP 1370.  Chest x-ray with left lower lobe airspace disease and small pleural effusion.  Started on sepsis protocol, steroids DuoNebs, antibiotics and admitted to the hospital.  Also seen by cardiology.  Subjective: Patient seen and examined.  Still in the emergency room.  Poor historian.  She was struggling with navigating her iPhone.  She otherwise did not have any complaints.  Looks comfortable at rest.  Afebrile overnight.  Temperature normalized.   Assessment & Plan:   Acute respiratory failure with hypoxemia: 1 to 2 weeks progressive shortness of breath.  Likely multifactorial.  Suspect CHF exacerbation given left-sided pleural effusion.  Hypothermia was likely environmental. Presumed pneumonia, treated with antibiotics.  Continue ceftriaxone  and doxycycline .  Chest physiotherapy, incentive spirometry, deep breathing exercises, sputum induction, mucolytic's and bronchodilators. Sputum cultures, blood cultures, Legionella and streptococcal antigen. Supplemental oxygen  to keep saturations more than 90%. Mobilize with PT OT. COVID, influenza, extended respiratory virus panel negative.  Acute on chronic diastolic heart failure: Suspected with presentation.  Latest echocardiogram 9/24 with  normal ejection fraction, grade 1 diastolic dysfunction.  Troponins flat.  BNP elevated. Repeat echocardiogram today. Currently remains on Lasix  40 mg IV twice daily, strict intake and output not available.  Ordered.  Sinus bradycardia: Suspected medication induced, hypothermia.  On Cardizem  at home.  Discontinued.  Heart rate is more than 60 today.  AKI/hyponatremia: Likely volume related.  Monitor closely.  Acute metabolic encephalopathy: Likely due to hypoxemia, hypothermia and suspected infection.  CT scan negative.  Treating for pneumonia.  Will resume her long-term medications including low-dose benzodiazepines and opiates to avoid withdrawals.  Type 2 diabetes: Well-controlled.  A1c 6.4.  On glipizide  at home.  Currently on sliding scale insulin .    DVT prophylaxis: heparin  injection 5,000 Units Start: 12/28/23 2200 Place TED hose Start: 12/28/23 1806   Code Status: Full code Family Communication: None at the bedside Disposition Plan: Status is: Inpatient Remains inpatient appropriate because: Significant illness, IV diuresis, IV antibiotics     Consultants:  Cardiology  Procedures:  None  Antimicrobials:  Ceftriaxone  and doxycycline  1/12---     Objective: Vitals:   12/29/23 0314 12/29/23 0414 12/29/23 0415 12/29/23 0727  BP:  125/62 (!) 141/61 (!) 166/97  Pulse: 62 (!) 57 (!) 55 66  Resp: 20 18 17 16   Temp:  (!) 96.1 F (35.6 C)    TempSrc:  Axillary  Oral  SpO2: 92% 92% 97% 99%  Weight:      Height:       No intake or output data in the 24 hours ending 12/29/23 0821 Filed Weights   12/28/23 1314  Weight: 84.8 kg    Examination:  General exam: Appears slightly anxious.  On 7 to 8 L of oxygen .  Does not look like in any discomfort. Respiratory system:  Clear to auscultation. Respiratory effort normal.  No added sounds. Cardiovascular system: S1 & S2 heard, RRR.  Trace bilateral pedal edema.   Gastrointestinal system: Abdomen is nondistended, soft and  nontender. No organomegaly or masses felt. Normal bowel sounds heard. Central nervous system: Alert and awake.  Mostly oriented.  She is slow to respond. Extremities: Symmetric 5 x 5 power.    Data Reviewed: I have personally reviewed following labs and imaging studies  CBC: Recent Labs  Lab 12/28/23 1359 12/28/23 1939 12/29/23 0456  WBC 11.1*  --  9.8  NEUTROABS 9.6*  --   --   HGB 11.7* 12.2 11.6*  HCT 35.3* 36.0 35.0*  MCV 86.7  --  86.0  PLT 221  --  215   Basic Metabolic Panel: Recent Labs  Lab 12/28/23 1359 12/28/23 1746 12/28/23 1939 12/29/23 0456  NA 127*  --  125* 128*  K 4.3  --  4.6 4.1  CL 92*  --   --  93*  CO2 22  --   --  21*  GLUCOSE 112*  --   --  112*  BUN 30*  --   --  27*  CREATININE 1.59*  --   --  1.20*  CALCIUM  9.6  --   --  9.6  MG  --  1.9  --   --    GFR: Estimated Creatinine Clearance: 39.7 mL/min (A) (by C-G formula based on SCr of 1.2 mg/dL (H)). Liver Function Tests: Recent Labs  Lab 12/28/23 1359  AST 34  ALT 31  ALKPHOS 139*  BILITOT 0.7  PROT 6.5  ALBUMIN 3.8   No results for input(s): LIPASE, AMYLASE in the last 168 hours. No results for input(s): AMMONIA in the last 168 hours. Coagulation Profile: Recent Labs  Lab 12/28/23 1359  INR 1.2   Cardiac Enzymes: No results for input(s): CKTOTAL, CKMB, CKMBINDEX, TROPONINI in the last 168 hours. BNP (last 3 results) Recent Labs    08/19/23 1145  PROBNP 237.0*   HbA1C: No results for input(s): HGBA1C in the last 72 hours. CBG: No results for input(s): GLUCAP in the last 168 hours. Lipid Profile: No results for input(s): CHOL, HDL, LDLCALC, TRIG, CHOLHDL, LDLDIRECT in the last 72 hours. Thyroid  Function Tests: Recent Labs    12/28/23 1746  TSH 5.477*   Anemia Panel: Recent Labs    12/28/23 1920  VITAMINB12 535   Sepsis Labs: Recent Labs  Lab 12/28/23 1425 12/28/23 1552  PROCALCITON  --  <0.10  LATICACIDVEN 1.9  --      Recent Results (from the past 240 hours)  Resp panel by RT-PCR (RSV, Flu A&B, Covid) Anterior Nasal Swab     Status: None   Collection Time: 12/28/23  1:59 PM   Specimen: Anterior Nasal Swab  Result Value Ref Range Status   SARS Coronavirus 2 by RT PCR NEGATIVE NEGATIVE Final   Influenza A by PCR NEGATIVE NEGATIVE Final   Influenza B by PCR NEGATIVE NEGATIVE Final    Comment: (NOTE) The Xpert Xpress SARS-CoV-2/FLU/RSV plus assay is intended as an aid in the diagnosis of influenza from Nasopharyngeal swab specimens and should not be used as a sole basis for treatment. Nasal washings and aspirates are unacceptable for Xpert Xpress SARS-CoV-2/FLU/RSV testing.  Fact Sheet for Patients: bloggercourse.com  Fact Sheet for Healthcare Providers: seriousbroker.it  This test is not yet approved or cleared by the [place]  FDA and has been authorized for detection and/or diagnosis of SARS-CoV-2 by  FDA under an Emergency Use Authorization (EUA). This EUA will remain in effect (meaning this test can be used) for the duration of the COVID-19 declaration under Section 564(b)(1) of the Act, 21 U.S.C. section 360bbb-3(b)(1), unless the authorization is terminated or revoked.     Resp Syncytial Virus by PCR NEGATIVE NEGATIVE Final    Comment: (NOTE) Fact Sheet for Patients: bloggercourse.com  Fact Sheet for Healthcare Providers: seriousbroker.it  This test is not yet approved or cleared by the [place]  FDA and has been authorized for detection and/or diagnosis of SARS-CoV-2 by FDA under an Emergency Use Authorization (EUA). This EUA will remain in effect (meaning this test can be used) for the duration of the COVID-19 declaration under Section 564(b)(1) of the Act, 21 U.S.C. section 360bbb-3(b)(1), unless the authorization is terminated or revoked.  Performed at [facility], [address] [place], [place] [address]   Respiratory (~20 pathogens) panel by PCR     Status: None   Collection Time: 12/28/23  1:59 PM   Specimen: Nasopharyngeal Swab; Respiratory  Result Value Ref Range Status   Adenovirus NOT DETECTED NOT DETECTED Final   Coronavirus 229E NOT DETECTED NOT DETECTED Final    Comment: (NOTE) The Coronavirus on the Respiratory Panel, DOES NOT test for the novel  Coronavirus (2019 nCoV)    Coronavirus HKU1 NOT DETECTED NOT DETECTED Final   Coronavirus NL63 NOT DETECTED NOT DETECTED Final   Coronavirus OC43 NOT DETECTED NOT DETECTED Final   Metapneumovirus NOT DETECTED NOT DETECTED Final   Rhinovirus / Enterovirus NOT DETECTED NOT DETECTED Final   Influenza A NOT DETECTED NOT DETECTED Final   Influenza B NOT DETECTED NOT DETECTED Final   Parainfluenza Virus 1 NOT DETECTED NOT DETECTED Final   Parainfluenza Virus 2 NOT DETECTED NOT DETECTED Final   Parainfluenza Virus 3 NOT DETECTED NOT DETECTED Final   Parainfluenza Virus 4 NOT DETECTED NOT DETECTED Final   Respiratory Syncytial Virus NOT DETECTED NOT DETECTED Final   Bordetella pertussis NOT DETECTED NOT DETECTED Final   Bordetella Parapertussis NOT DETECTED NOT DETECTED Final   Chlamydophila pneumoniae NOT DETECTED NOT DETECTED Final   Mycoplasma pneumoniae NOT DETECTED NOT DETECTED Final    Comment: Performed at [facility], [address], [place], [place] [address]  Blood Culture (routine x 2)     Status: None (Preliminary result)   Collection Time: 12/28/23  2:16 PM   Specimen: BLOOD  Result Value Ref Range Status   Specimen Description BLOOD BLOOD RIGHT FOREARM  Final   Special Requests   Final    BOTTLES DRAWN AEROBIC AND ANAEROBIC Blood Culture results may not be optimal due to an inadequate volume of blood received in culture bottles   Culture   Final    NO GROWTH < 24 HOURS Performed at [facility], [address] [place], [place] [address]    Report Status  PENDING  Incomplete  Blood Culture (routine x 2)     Status: None (Preliminary result)   Collection Time: 12/28/23  2:17 PM   Specimen: BLOOD  Result Value Ref Range Status   Specimen Description BLOOD BLOOD LEFT HAND  Final   Special Requests   Final    BOTTLES DRAWN AEROBIC AND ANAEROBIC Blood Culture results may not be optimal due to an inadequate volume of blood received in culture bottles   Culture   Final    NO GROWTH < 24 HOURS Performed at [facility],  [address] [place], [place] [address]    Report Status PENDING  Incomplete         Radiology Studies: CT HEAD WO CONTRAST ( ) Result Date: 12/28/2023 CLINICAL DATA:  Delirium. EXAM: CT HEAD WITHOUT CONTRAST TECHNIQUE: Contiguous axial images were obtained from the base of the skull through the vertex without intravenous contrast. RADIATION DOSE REDUCTION: This exam was performed according to the departmental dose-optimization program which includes automated exposure control, adjustment of the mA and/or kV according to patient size and/or use of iterative reconstruction technique. COMPARISON:  Brain MRI 11/01/2018 FINDINGS: Brain: No intracranial hemorrhage, mass effect, or midline shift. Brain volume is normal for age. No hydrocephalus. The basilar cisterns are patent. Mild periventricular and deep white matter hypodensity typical of chronic small vessel ischemia. No evidence of territorial infarct or acute ischemia. No extra-axial or intracranial fluid collection. Vascular: No hyperdense vessel or unexpected calcification. Skull: No fracture or focal lesion. Sinuses/Orbits: Paranasal sinuses and mastoid air cells are clear. The visualized orbits are unremarkable. Other: None. IMPRESSION: 1.  No acute intracranial abnormality. 2. Mild for age chronic small vessel ischemic change. Electronically Signed   By: Andrea Gasman M.D.   On: 12/28/2023 20:29   DG Chest Port 1 View Result Date: 12/28/2023 CLINICAL DATA:   Shortness of breath and bradycardia. EXAM: PORTABLE CHEST 1 VIEW COMPARISON:  Chest x-ray dated November 02, 2018. FINDINGS: The heart size and mediastinal contours are within normal limits. Chronic interstitial coarsening is similar to prior study and likely smoking-related. Dense left lower lobe consolidation with small left pleural effusion. Clear right lung. No pneumothorax. No acute osseous abnormality. IMPRESSION: 1. Left lower lobe pneumonia with small left pleural effusion. Followup PA and lateral chest X-ray is recommended in 3-4 weeks following trial of antibiotic therapy to ensure resolution. Electronically Signed   By: Elsie ONEIDA Shoulder M.D.   On: 12/28/2023 14:05        Scheduled Meds:  allopurinol   300 mg Oral Daily   aspirin  EC  81 mg Oral Daily   atorvastatin   40 mg Oral Daily   doxycycline   100 mg Oral Q12H   furosemide   40 mg Intravenous Q12H   heparin   5,000 Units Subcutaneous Q8H   hydroxychloroquine   200 mg Oral BID   insulin  aspart  0-9 Units Subcutaneous TID WC   Continuous Infusions:  cefTRIAXone  (ROCEPHIN )  IV       LOS: 1 day    Time spent: 52 minutes    Renato Applebaum, MD [facility]

## 2023-12-30 DIAGNOSIS — I5033 Acute on chronic diastolic (congestive) heart failure: Secondary | ICD-10-CM | POA: Diagnosis not present

## 2023-12-30 DIAGNOSIS — I272 Pulmonary hypertension, unspecified: Secondary | ICD-10-CM

## 2023-12-30 DIAGNOSIS — J9601 Acute respiratory failure with hypoxia: Secondary | ICD-10-CM | POA: Diagnosis not present

## 2023-12-30 LAB — CBC WITH DIFFERENTIAL/PLATELET
Abs Immature Granulocytes: 0.14 10*3/uL — ABNORMAL HIGH (ref 0.00–0.07)
Basophils Absolute: 0 10*3/uL (ref 0.0–0.1)
Basophils Relative: 0 %
Eosinophils Absolute: 0 10*3/uL (ref 0.0–0.5)
Eosinophils Relative: 0 %
HCT: 32.4 % — ABNORMAL LOW (ref 36.0–46.0)
Hemoglobin: 11.1 g/dL — ABNORMAL LOW (ref 12.0–15.0)
Immature Granulocytes: 1 %
Lymphocytes Relative: 3 %
Lymphs Abs: 0.4 10*3/uL — ABNORMAL LOW (ref 0.7–4.0)
MCH: 29.4 pg (ref 26.0–34.0)
MCHC: 34.3 g/dL (ref 30.0–36.0)
MCV: 85.7 fL (ref 80.0–100.0)
Monocytes Absolute: 1 10*3/uL (ref 0.1–1.0)
Monocytes Relative: 9 %
Neutro Abs: 9.5 10*3/uL — ABNORMAL HIGH (ref 1.7–7.7)
Neutrophils Relative %: 87 %
Platelets: 237 10*3/uL (ref 150–400)
RBC: 3.78 MIL/uL — ABNORMAL LOW (ref 3.87–5.11)
RDW: 15.5 % (ref 11.5–15.5)
WBC: 11 10*3/uL — ABNORMAL HIGH (ref 4.0–10.5)
nRBC: 0 % (ref 0.0–0.2)

## 2023-12-30 LAB — LEGIONELLA PNEUMOPHILA SEROGP 1 UR AG: L. pneumophila Serogp 1 Ur Ag: NEGATIVE

## 2023-12-30 LAB — COMPREHENSIVE METABOLIC PANEL
ALT: 40 U/L (ref 0–44)
AST: 62 U/L — ABNORMAL HIGH (ref 15–41)
Albumin: 3.4 g/dL — ABNORMAL LOW (ref 3.5–5.0)
Alkaline Phosphatase: 122 U/L (ref 38–126)
Anion gap: 9 (ref 5–15)
BUN: 18 mg/dL (ref 8–23)
CO2: 30 mmol/L (ref 22–32)
Calcium: 9.4 mg/dL (ref 8.9–10.3)
Chloride: 95 mmol/L — ABNORMAL LOW (ref 98–111)
Creatinine, Ser: 0.91 mg/dL (ref 0.44–1.00)
GFR, Estimated: >60 mL/min (ref >60)
Glucose, Bld: 107 mg/dL — ABNORMAL HIGH (ref 70–99)
Potassium: 3.3 mmol/L — ABNORMAL LOW (ref 3.5–5.1)
Sodium: 134 mmol/L — ABNORMAL LOW (ref 135–145)
Total Bilirubin: 0.8 mg/dL (ref 0.0–1.2)
Total Protein: 5.8 g/dL — ABNORMAL LOW (ref 6.5–8.1)

## 2023-12-30 LAB — PHOSPHORUS: Phosphorus: 4 mg/dL (ref 2.5–4.6)

## 2023-12-30 LAB — GLUCOSE, CAPILLARY
Glucose-Capillary: 102 mg/dL — ABNORMAL HIGH (ref 70–99)
Glucose-Capillary: 51 mg/dL — ABNORMAL LOW (ref 70–99)
Glucose-Capillary: 108 mg/dL — ABNORMAL HIGH (ref 70–99)
Glucose-Capillary: 94 mg/dL (ref 70–99)

## 2023-12-30 LAB — MAGNESIUM: Magnesium: 1.7 mg/dL (ref 1.7–2.4)

## 2023-12-30 MED ORDER — MAGNESIUM SULFATE IN D5W 1-5 GM/100ML-% IV SOLN
1.0000 g | Freq: Once | INTRAVENOUS | Status: AC
  Administered 2023-12-30: 1 g via INTRAVENOUS
  Filled 2023-12-30: qty 100

## 2023-12-30 MED ORDER — POTASSIUM CHLORIDE CRYS ER 20 MEQ PO TBCR
40.0000 meq | EXTENDED_RELEASE_TABLET | Freq: Four times a day (QID) | ORAL | Status: AC
  Administered 2023-12-30 (×2): 40 meq via ORAL
  Filled 2023-12-30: qty 2

## 2023-12-30 NOTE — Evaluation (Signed)
 Occupational Therapy Evaluation Patient Details Name: Deborah Bryant MRN: [id number] DOB: 03-27-1946 Today's Date: 12/30/2023   History of Present Illness Pt is 78 yo female who presents on 12/28/23 with hypoxia, bradycardia, and AMS. PMH: pneumonia, MRSA 2006, HTN, HLD, GERD, DM, dyspnea, COPD, CKD, L periorbital cellulitis, L4-5 fusion, chronic pain   Clinical Impression   This 78 yo female admitted with above presents to acute OT with PLOF of being totally independent with basic ADLs, IADLs, driving, and not on O2. Currently she is setup-CGA for basic ADLs from a RW level and drops as low as 87% on 5 liters standard [place] when up and about in room. She will continue to benefit from acute OT with follow up HHOT.      If plan is discharge home, recommend the following: A little help with walking and/or transfers;A little help with bathing/dressing/bathroom;Assistance with cooking/housework;Help with stairs or ramp for entrance;Assist for transportation    Functional Status Assessment  Patient has had a recent decline in their functional status and demonstrates the ability to make significant improvements in function in a reasonable and predictable amount of time.  Equipment Recommendations  None recommended by OT       Precautions / Restrictions Precautions Precautions: Other (comment) Precaution Comments: watch O2 sats Restrictions Weight Bearing Restrictions Per Provider Order: No      Mobility Bed Mobility Overal bed mobility: Modified Independent (increased time and HOB Up)                  Transfers Overall transfer level: Needs assistance Equipment used: Rolling walker (2 wheels) Transfers: Sit to/from Stand Sit to Stand: Contact guard assist                  Balance Overall balance assessment: Needs assistance Sitting-balance support: No upper extremity supported, Feet supported Sitting balance-Leahy Scale: Good     Standing balance support: No upper  extremity supported, During functional activity Standing balance-Leahy Scale: Fair Standing balance comment: standing to get hand gel and then rub hand gel in                           ADL either performed or assessed with clinical judgement   ADL Overall ADL's : Needs assistance/impaired Eating/Feeding: Independent;Sitting   Grooming: Contact guard assist;Standing Grooming Details (indicate cue type and reason): to use hand gel for hands Upper Body Bathing: Set up;Sitting   Lower Body Bathing: Contact guard assist;Sit to/from stand   Upper Body Dressing : Set up;Sitting   Lower Body Dressing: Contact guard assist;Sit to/from stand Lower Body Dressing Details (indicate cue type and reason): pt can cross legs to get go feet while seated Toilet Transfer: Contact guard assist;Ambulation;Rolling walker (2 wheels)   Toileting- Clothing Manipulation and Hygiene: Contact guard assist;Sit to/from stand               Vision Baseline Vision/History: 1 Wears glasses Ability to See in Adequate Light: 0 Adequate Patient Visual Report: No change from baseline              Pertinent Vitals/Pain Pain Assessment Pain Assessment: 0-10 Faces Pain Scale: Hurts a little bit Pain Location: RA (hands and knees) Pain Descriptors / Indicators: Aching Pain Intervention(s): Limited activity within patient's tolerance, Monitored during session, Repositioned     Extremity/Trunk Assessment Upper Extremity Assessment Upper Extremity Assessment: Right hand dominant (RA both hands)  Cognition Arousal: Alert Behavior During Therapy: WFL for tasks assessed/performed Overall Cognitive Status: Within Functional Limits for tasks assessed                                       General Comments  Sats on 5 liters HFNC 94% and same on standard [place] at rest, but with activity with standard [place] pt dropped as low as 87% on 5 liters (this tubing was longer to allow  her to get to bathroom and back). Left on 5 liters HFNC at end of session            Home Living Family/patient expects to be discharged to:: Private residence Living Arrangements: Spouse/significant other Available Help at Discharge: Family;Available PRN/intermittently Type of Home: House Home Access: Stairs to enter [employer] of Steps: 1   Home Layout: One level     Bathroom Shower/Tub: Producer, Television/film/video: Standard     Home Equipment: Grab bars - tub/shower;Rolling Environmental Consultant (2 wheels);Cane - single point;Shower seat - built in   Additional Comments: Pt lives with husband that she needs to A for some things. Nephew helps them as needed. Pt's sister is in town for awhile      Prior Functioning/Environment Prior Level of Function : Independent/Modified Independent;Driving             Mobility Comments: does not usually use an AD, independent ADLs Comments: independent        OT Problem List: Decreased activity tolerance;Impaired balance (sitting and/or standing)      OT Treatment/Interventions: Self-care/ADL training;Energy conservation;Patient/family education;Balance training    OT Goals(Current goals can be found in the care plan section) Acute Rehab OT Goals Patient Stated Goal: to get back to doing the things as I was before OT Goal Formulation: With patient Time For Goal Achievement: 01/13/24 Potential to Achieve Goals: Good  OT Frequency: Min 1X/week       AM-PAC OT 6 Clicks Daily Activity     Outcome Measure Help from another person eating meals?: None Help from another person taking care of personal grooming?: A Little Help from another person toileting, which includes using toliet, bedpan, or urinal?: A Little Help from another person bathing (including washing, rinsing, drying)?: A Little Help from another person to put on and taking off regular upper body clothing?: A Little Help from another person to put on and  taking off regular lower body clothing?: A Little 6 Click Score: 19   End of Session Equipment Utilized During Treatment: Gait belt;Rolling walker (2 wheels)  Activity Tolerance: Patient tolerated treatment well Patient left: in bed;with call bell/phone within reach;with bed alarm set;with family/visitor present  OT Visit Diagnosis: Unsteadiness on feet (R26.81);Other abnormalities of gait and mobility (R26.89);Muscle weakness (generalized) (M62.81)                Time: 1541-1610 OT Time Calculation (min): 29 min Charges:  OT General Charges $OT Visit: 1 Visit OT Evaluation $OT Eval Moderate Complexity: 1 Mod OT Treatments $Self Care/Home Management : 8-22 mins  Donny BECKER OT Acute Rehabilitation Services Office [phone]    [place] Dorothyann Distel 12/30/2023, 4:57 PM

## 2023-12-30 NOTE — Inpatient Diabetes Management (Signed)
 Inpatient Diabetes Program Recommendations  AACE/ADA: New Consensus Statement on Inpatient Glycemic Control (2015)  Target Ranges:  Prepandial:   less than 140 mg/dL      Peak postprandial:   less than 180 mg/dL (1-2 hours)      Critically ill patients:  140 - 180 mg/dL   Lab Results  Component Value Date   GLUCAP 51 (L) 12/30/2023   HGBA1C 6.3 08/19/2023    Review of Glycemic Control  Latest Reference Range & Units 12/29/23 08:48 12/29/23 11:45 12/29/23 16:52 12/29/23 21:35 12/30/23 07:42 12/30/23 12:06  Glucose-Capillary 70 - 99 mg/dL 881 (H) 881 (H) 863 (H)  Novolog  1 unit 138 (H) 102 (H) 51 (L)   Diabetes history: DM 2 Outpatient Diabetes medications: Glipizide  5 mg Daily Current orders for Inpatient glycemic control:  Novolog  0-9 units tid  Note hypoglycemia 51 at lunchtime today.  Inpatient Diabetes Program Recommendations:    -   Reduce Novolog  correction to very sensitive 0-6 units tid starting at 151 mg/dl.  Thanks,  Clotilda Bull RN, MSN, BC-ADM Inpatient Diabetes Coordinator Team Pager [phone] (8a-5p)

## 2023-12-30 NOTE — Progress Notes (Signed)
 DAILY PROGRESS NOTE   Patient Name: Deborah Bryant Date of Encounter: 12/30/2023 Cardiologist: None  Chief Complaint   Breathing is better  Patient Profile   Deborah Bryant is a 78 y.o. female with a hx of HTN, DM2, and chronic pain on opioids who is being seen 12/28/2023 for the evaluation of hypoxia, bradycardia, and altered mental status   Subjective   Diuresed about 1.1 L Negative overnight. Echo yesterday showed preserved LVEF 60-65%, grade 1 DD, moderate pulmonary hypertension and mild MR, IVC suggests RA pressure of 15 mmHg.  Objective   Vitals:   12/30/23 0642 12/30/23 0643 12/30/23 0700 12/30/23 0745  BP: (!) 170/66 (!) 164/63  (!) 176/84  Pulse: 60 (!) 59  62  Resp: 19 15 14    Temp:      TempSrc:      SpO2: 96% 97%  96%  Weight:      Height:        Intake/Output Summary (Last 24 hours) at 12/30/2023 1011 Last data filed at 12/29/2023 1359 Gross per 24 hour  Intake --  Output 1100 ml  Net -1100 ml   Filed Weights   12/28/23 1314  Weight: 84.8 kg    Physical Exam   General appearance: alert, no distress, and moderately obese Neck: JVD - 2 cm above sternal notch, no carotid bruit, and thyroid  not enlarged, symmetric, no tenderness/mass/nodules Lungs: diminished breath sounds bilaterally, rales bibasilar, and wheezes bilaterally Heart: regular rate and rhythm Abdomen: soft, non-tender; bowel sounds normal; no masses,  no organomegaly Extremities: edema trace sockline bilaterally Pulses: 2+ and symmetric Skin: Skin color, texture, turgor normal. No rashes or lesions Neurologic: Mental status: Alert, oriented, thought content appropriate Psych: Pleasant  Inpatient Medications    Scheduled Meds:  allopurinol   300 mg Oral Daily   aspirin  EC  81 mg Oral Daily   atorvastatin   40 mg Oral Daily   doxycycline   100 mg Oral Q12H   furosemide   40 mg Intravenous Q12H   heparin   5,000 Units Subcutaneous Q8H   hydroxychloroquine   200 mg Oral BID   insulin   aspart  0-9 Units Subcutaneous TID WC   potassium chloride   40 mEq Oral Q6H    Continuous Infusions:  cefTRIAXone  (ROCEPHIN )  IV 1 g (12/30/23 0820)   magnesium  sulfate bolus IVPB      PRN Meds: acetaminophen  **OR** acetaminophen , albuterol , diazepam , HYDROcodone -acetaminophen    Labs   Results for orders placed or performed during the hospital encounter of 12/28/23 (from the past 48 hours)  Comprehensive metabolic panel     Status: Abnormal   Collection Time: 12/28/23  1:59 PM  Result Value Ref Range   Sodium 127 (L) 135 - 145 mmol/L   Potassium 4.3 3.5 - 5.1 mmol/L   Chloride 92 (L) 98 - 111 mmol/L   CO2 22 22 - 32 mmol/L   Glucose, Bld 112 (H) 70 - 99 mg/dL    Comment: Glucose reference range applies only to samples taken after fasting for at least 8 hours.   BUN 30 (H) 8 - 23 mg/dL   Creatinine, Ser 8.40 (H) 0.44 - 1.00 mg/dL   Calcium  9.6 8.9 - 10.3 mg/dL   Total Protein 6.5 6.5 - 8.1 g/dL   Albumin 3.8 3.5 - 5.0 g/dL   AST 34 15 - 41 U/L   ALT 31 0 - 44 U/L   Alkaline Phosphatase 139 (H) 38 - 126 U/L   Total Bilirubin 0.7 0.0 - 1.2  mg/dL   GFR, Estimated 33 (L) >60 mL/min    Comment: (NOTE) Calculated using the CKD-EPI Creatinine Equation (2021)    Anion gap 13 5 - 15    Comment: Performed at [facility], [address] [place], [place] [address]  CBC with Differential     Status: Abnormal   Collection Time: 12/28/23  1:59 PM  Result Value Ref Range   WBC 11.1 (H) 4.0 - 10.5 K/uL   RBC 4.07 3.87 - 5.11 MIL/uL   Hemoglobin 11.7 (L) 12.0 - 15.0 g/dL   HCT 64.6 (L) 63.9 - 53.9 %   MCV 86.7 80.0 - 100.0 fL   MCH 28.7 26.0 - 34.0 pg   MCHC 33.1 30.0 - 36.0 g/dL   RDW 84.8 88.4 - 84.4 %   Platelets 221 150 - 400 K/uL   nRBC 0.0 0.0 - 0.2 %   Neutrophils Relative % 87 %   Neutro Abs 9.6 (H) 1.7 - 7.7 K/uL   Lymphocytes Relative 4 %   Lymphs Abs 0.5 (L) 0.7 - 4.0 K/uL   Monocytes Relative 7 %   Monocytes Absolute 0.8 0.1 - 1.0 K/uL   Eosinophils Relative  0 %   Eosinophils Absolute 0.0 0.0 - 0.5 K/uL   Basophils Relative 1 %   Basophils Absolute 0.1 0.0 - 0.1 K/uL   Immature Granulocytes 1 %   Abs Immature Granulocytes 0.14 (H) 0.00 - 0.07 K/uL    Comment: Performed at [facility], [address], [place], [place] [address]  Troponin I (High Sensitivity)     Status: Abnormal   Collection Time: 12/28/23  1:59 PM  Result Value Ref Range   Troponin I (High Sensitivity) 32 (H) <18 ng/L    Comment: (NOTE) Elevated high sensitivity troponin I (hsTnI) values and significant  changes across serial measurements may suggest ACS but many other  chronic and acute conditions are known to elevate hsTnI results.  Refer to the Links section for chest pain algorithms and additional  guidance. Performed at [facility], [address], [place], [place] [address]   Brain natriuretic peptide     Status: Abnormal   Collection Time: 12/28/23  1:59 PM  Result Value Ref Range   B Natriuretic Peptide 1,370.4 (H) 0.0 - 100.0 pg/mL    Comment: Performed at [facility], [address] [place], [place] [address]  Resp panel by RT-PCR (RSV, Flu A&B, Covid) Anterior Nasal Swab     Status: None   Collection Time: 12/28/23  1:59 PM   Specimen: Anterior Nasal Swab  Result Value Ref Range   SARS Coronavirus 2 by RT PCR NEGATIVE NEGATIVE   Influenza A by PCR NEGATIVE NEGATIVE   Influenza B by PCR NEGATIVE NEGATIVE    Comment: (NOTE) The Xpert Xpress SARS-CoV-2/FLU/RSV plus assay is intended as an aid in the diagnosis of influenza from Nasopharyngeal swab specimens and should not be used as a sole basis for treatment. Nasal washings and aspirates are unacceptable for Xpert Xpress SARS-CoV-2/FLU/RSV testing.  Fact Sheet for Patients: bloggercourse.com  Fact Sheet for Healthcare Providers: seriousbroker.it  This test is not yet approved or cleared by the [place]  FDA and has been  authorized for detection and/or diagnosis of SARS-CoV-2 by FDA under an Emergency Use Authorization (EUA). This EUA will remain in effect (meaning this test can be used) for the duration of the COVID-19 declaration under Section 564(b)(1) of the Act, 21 U.S.C. section 360bbb-3(b)(1),  unless the authorization is terminated or revoked.     Resp Syncytial Virus by PCR NEGATIVE NEGATIVE    Comment: (NOTE) Fact Sheet for Patients: bloggercourse.com  Fact Sheet for Healthcare Providers: seriousbroker.it  This test is not yet approved or cleared by the [place]  FDA and has been authorized for detection and/or diagnosis of SARS-CoV-2 by FDA under an Emergency Use Authorization (EUA). This EUA will remain in effect (meaning this test can be used) for the duration of the COVID-19 declaration under Section 564(b)(1) of the Act, 21 U.S.C. section 360bbb-3(b)(1), unless the authorization is terminated or revoked.  Performed at [facility], [address], [place], [place] [address]   Protime-INR     Status: Abnormal   Collection Time: 12/28/23  1:59 PM  Result Value Ref Range   Prothrombin Time 15.8 (H) 11.4 - 15.2 seconds   INR 1.2 0.8 - 1.2    Comment: (NOTE) INR goal varies based on device and disease states. Performed at [facility], [address], [place], [place] [address]   APTT     Status: None   Collection Time: 12/28/23  1:59 PM  Result Value Ref Range   aPTT 35 24 - 36 seconds    Comment: Performed at [facility], [address] [place], [place] [address]  Respiratory (~20 pathogens) panel by PCR     Status: None   Collection Time: 12/28/23  1:59 PM   Specimen: Nasopharyngeal Swab; Respiratory  Result Value Ref Range   Adenovirus NOT DETECTED NOT DETECTED   Coronavirus 229E NOT DETECTED NOT DETECTED    Comment: (NOTE) The Coronavirus on the Respiratory Panel, DOES NOT test for the novel   Coronavirus (2019 nCoV)    Coronavirus HKU1 NOT DETECTED NOT DETECTED   Coronavirus NL63 NOT DETECTED NOT DETECTED   Coronavirus OC43 NOT DETECTED NOT DETECTED   Metapneumovirus NOT DETECTED NOT DETECTED   Rhinovirus / Enterovirus NOT DETECTED NOT DETECTED   Influenza A NOT DETECTED NOT DETECTED   Influenza B NOT DETECTED NOT DETECTED   Parainfluenza Virus 1 NOT DETECTED NOT DETECTED   Parainfluenza Virus 2 NOT DETECTED NOT DETECTED   Parainfluenza Virus 3 NOT DETECTED NOT DETECTED   Parainfluenza Virus 4 NOT DETECTED NOT DETECTED   Respiratory Syncytial Virus NOT DETECTED NOT DETECTED   Bordetella pertussis NOT DETECTED NOT DETECTED   Bordetella Parapertussis NOT DETECTED NOT DETECTED   Chlamydophila pneumoniae NOT DETECTED NOT DETECTED   Mycoplasma pneumoniae NOT DETECTED NOT DETECTED    Comment: Performed at [facility], [address], [place], [place] [address]  Blood Culture (routine x 2)     Status: None (Preliminary result)   Collection Time: 12/28/23  2:16 PM   Specimen: BLOOD  Result Value Ref Range   Specimen Description BLOOD BLOOD RIGHT FOREARM    Special Requests      BOTTLES DRAWN AEROBIC AND ANAEROBIC Blood Culture results may not be optimal due to an inadequate volume of blood received in culture bottles   Culture      NO GROWTH 2 DAYS Performed at [facility], [address] [place], [place] [address]    Report Status PENDING   Blood Culture (routine x 2)     Status: None (Preliminary result)   Collection Time: 12/28/23  2:17 PM   Specimen: BLOOD  Result Value Ref Range   Specimen Description BLOOD BLOOD LEFT HAND    Special Requests  BOTTLES DRAWN AEROBIC AND ANAEROBIC Blood Culture results may not be optimal due to an inadequate volume of blood received in culture bottles   Culture      NO GROWTH 2 DAYS Performed at [facility], [address] [place], [place] [address]    Report Status PENDING   I-Stat Lactic Acid, ED      Status: None   Collection Time: 12/28/23  2:25 PM  Result Value Ref Range   Lactic Acid, Venous 1.9 0.5 - 1.9 mmol/L  Troponin I (High Sensitivity)     Status: Abnormal   Collection Time: 12/28/23  3:52 PM  Result Value Ref Range   Troponin I (High Sensitivity) 28 (H) <18 ng/L    Comment: (NOTE) Elevated high sensitivity troponin I (hsTnI) values and significant  changes across serial measurements may suggest ACS but many other  chronic and acute conditions are known to elevate hsTnI results.  Refer to the Links section for chest pain algorithms and additional  guidance. Performed at [facility], [address], [place], [place] [address]   Procalcitonin     Status: None   Collection Time: 12/28/23  3:52 PM  Result Value Ref Range   Procalcitonin <0.10 ng/mL    Comment:        Interpretation: PCT (Procalcitonin) <= 0.5 ng/mL: Systemic infection (sepsis) is not likely. Local bacterial infection is possible. (NOTE)       Sepsis PCT Algorithm           Lower Respiratory Tract                                      Infection PCT Algorithm    ----------------------------     ----------------------------         PCT < 0.25 ng/mL                PCT < 0.10 ng/mL          Strongly encourage             Strongly discourage   discontinuation of antibiotics    initiation of antibiotics    ----------------------------     -----------------------------       PCT 0.25 - 0.50 ng/mL            PCT 0.10 - 0.25 ng/mL               OR       >80% decrease in PCT            Discourage initiation of                                            antibiotics      Encourage discontinuation           of antibiotics    ----------------------------     -----------------------------         PCT >= 0.50 ng/mL              PCT 0.26 - 0.50 ng/mL               AND        <80% decrease in PCT             Encourage initiation of  antibiotics       Encourage  continuation           of antibiotics    ----------------------------     -----------------------------        PCT >= 0.50 ng/mL                  PCT > 0.50 ng/mL               AND         increase in PCT                  Strongly encourage                                      initiation of antibiotics    Strongly encourage escalation           of antibiotics                                     -----------------------------                                           PCT <= 0.25 ng/mL                                                 OR                                        > 80% decrease in PCT                                      Discontinue / Do not initiate                                             antibiotics  Performed at [facility], [address] [place] [address], [place], [place] [address]   Magnesium      Status: None   Collection Time: 12/28/23  5:46 PM  Result Value Ref Range   Magnesium  1.9 1.7 - 2.4 mg/dL    Comment: Performed at [facility], [address] [place], [place] [address]  Uric acid     Status: None   Collection Time: 12/28/23  5:46 PM  Result Value Ref Range   Uric Acid, Serum 3.1 2.5 - 7.1 mg/dL    Comment: HEMOLYSIS AT THIS LEVEL MAY AFFECT RESULT Performed at [facility], [address] [place], [place] [address]   TSH     Status: Abnormal   Collection Time: 12/28/23  5:46 PM  Result Value Ref Range   TSH 5.477 (H) 0.350 - 4.500 uIU/mL    Comment: Performed by a 3rd Generation assay with a functional sensitivity of <=0.01 uIU/mL. Performed at [facility],  [address] [place], [place] [address]   Vitamin B12     Status: None   Collection Time: 12/28/23  7:20 PM  Result Value Ref Range   Vitamin B-12 535 180 - 914 pg/mL    Comment: (NOTE) This assay is not validated for testing neonatal or myeloproliferative syndrome specimens for Vitamin B12 levels. Performed at [facility], [address] [place], [place] [address]    I-Stat venous blood gas, ED     Status: Abnormal   Collection Time: 12/28/23  7:39 PM  Result Value Ref Range   pH, Ven 7.347 7.25 - 7.43   pCO2, Ven 38.5 (L) 44 - 60 mmHg   pO2, Ven 67 (H) 32 - 45 mmHg   Bicarbonate 21.1 20.0 - 28.0 mmol/L   TCO2 22 22 - 32 mmol/L   O2 Saturation 92 %   Acid-base deficit 4.0 (H) 0.0 - 2.0 mmol/L   Sodium 125 (L) 135 - 145 mmol/L   Potassium 4.6 3.5 - 5.1 mmol/L   Calcium , Ion 1.10 (L) 1.15 - 1.40 mmol/L   HCT 36.0 36.0 - 46.0 %   Hemoglobin 12.2 12.0 - 15.0 g/dL   Sample type VENOUS   Urinalysis, w/ Reflex to Culture (Infection Suspected) -Urine, Clean Catch     Status: Abnormal   Collection Time: 12/29/23  1:04 AM  Result Value Ref Range   Specimen Source URINE, CLEAN CATCH    Color, Urine YELLOW YELLOW   APPearance HAZY (A) CLEAR   Specific Gravity, Urine 1.008 1.005 - 1.030   pH 5.0 5.0 - 8.0   Glucose, UA NEGATIVE NEGATIVE mg/dL   Hgb urine dipstick NEGATIVE NEGATIVE   Bilirubin Urine NEGATIVE NEGATIVE   Ketones, ur NEGATIVE NEGATIVE mg/dL   Protein, ur 30 (A) NEGATIVE mg/dL   Nitrite NEGATIVE NEGATIVE   Leukocytes,Ua MODERATE (A) NEGATIVE   RBC / HPF 0-5 0 - 5 RBC/hpf   WBC, UA 0-5 0 - 5 WBC/hpf    Comment:        Reflex urine culture not performed if WBC <=10, OR if Squamous epithelial cells >5. If Squamous epithelial cells >5 suggest recollection.    Bacteria, UA RARE (A) NONE SEEN   Squamous Epithelial / HPF 0-5 0 - 5 /HPF    Comment: Performed at [facility], [address], [place], [place] [address]  Rapid urine drug screen (hospital performed)     Status: Abnormal   Collection Time: 12/29/23  1:04 AM  Result Value Ref Range   Opiates POSITIVE (A) NONE DETECTED   Cocaine NONE DETECTED NONE DETECTED   Benzodiazepines POSITIVE (A) NONE DETECTED   Amphetamines NONE DETECTED NONE DETECTED   Tetrahydrocannabinol NONE DETECTED NONE DETECTED   Barbiturates NONE DETECTED NONE DETECTED    Comment: (NOTE) DRUG SCREEN FOR  MEDICAL PURPOSES ONLY.  IF CONFIRMATION IS NEEDED FOR ANY PURPOSE, NOTIFY LAB WITHIN 5 DAYS.  LOWEST DETECTABLE LIMITS FOR URINE DRUG SCREEN Drug Class                     Cutoff (ng/mL) Amphetamine and metabolites    1000 Barbiturate and metabolites    200 Benzodiazepine                 200 Opiates and metabolites        300 Cocaine and metabolites        300 THC  50 Performed at [facility], [address] [place], [place] [address]   Procalcitonin     Status: None   Collection Time: 12/29/23  4:56 AM  Result Value Ref Range   Procalcitonin <0.10 ng/mL    Comment:        Interpretation: PCT (Procalcitonin) <= 0.5 ng/mL: Systemic infection (sepsis) is not likely. Local bacterial infection is possible. (NOTE)       Sepsis PCT Algorithm           Lower Respiratory Tract                                      Infection PCT Algorithm    ----------------------------     ----------------------------         PCT < 0.25 ng/mL                PCT < 0.10 ng/mL          Strongly encourage             Strongly discourage   discontinuation of antibiotics    initiation of antibiotics    ----------------------------     -----------------------------       PCT 0.25 - 0.50 ng/mL            PCT 0.10 - 0.25 ng/mL               OR       >80% decrease in PCT            Discourage initiation of                                            antibiotics      Encourage discontinuation           of antibiotics    ----------------------------     -----------------------------         PCT >= 0.50 ng/mL              PCT 0.26 - 0.50 ng/mL               AND        <80% decrease in PCT             Encourage initiation of                                             antibiotics       Encourage continuation           of antibiotics    ----------------------------     -----------------------------        PCT >= 0.50 ng/mL                  PCT > 0.50 ng/mL                AND         increase in PCT                  Strongly encourage  initiation of antibiotics    Strongly encourage escalation           of antibiotics                                     -----------------------------                                           PCT <= 0.25 ng/mL                                                 OR                                        > 80% decrease in PCT                                      Discontinue / Do not initiate                                             antibiotics  Performed at [facility], [address], [place], [place] [address]   Basic metabolic panel     Status: Abnormal   Collection Time: 12/29/23  4:56 AM  Result Value Ref Range   Sodium 128 (L) 135 - 145 mmol/L   Potassium 4.1 3.5 - 5.1 mmol/L   Chloride 93 (L) 98 - 111 mmol/L   CO2 21 (L) 22 - 32 mmol/L   Glucose, Bld 112 (H) 70 - 99 mg/dL    Comment: Glucose reference range applies only to samples taken after fasting for at least 8 hours.   BUN 27 (H) 8 - 23 mg/dL   Creatinine, Ser 8.79 (H) 0.44 - 1.00 mg/dL   Calcium  9.6 8.9 - 10.3 mg/dL   GFR, Estimated 47 (L) >60 mL/min    Comment: (NOTE) Calculated using the CKD-EPI Creatinine Equation (2021)    Anion gap 14 5 - 15    Comment: Performed at [facility], [address] [place], [place] [address]  CBC     Status: Abnormal   Collection Time: 12/29/23  4:56 AM  Result Value Ref Range   WBC 9.8 4.0 - 10.5 K/uL   RBC 4.07 3.87 - 5.11 MIL/uL   Hemoglobin 11.6 (L) 12.0 - 15.0 g/dL   HCT 64.9 (L) 63.9 - 53.9 %   MCV 86.0 80.0 - 100.0 fL   MCH 28.5 26.0 - 34.0 pg   MCHC 33.1 30.0 - 36.0 g/dL   RDW 84.4 88.4 - 84.4 %   Platelets 215 150 - 400 K/uL   nRBC 0.0 0.0 - 0.2 %    Comment: Performed at [facility], [address] [place], [place] [address]  CBG monitoring, ED     Status: Abnormal   Collection Time: 12/29/23  8:48 AM  Result Value  Ref Range    Glucose-Capillary 118 (H) 70 - 99 mg/dL    Comment: Glucose reference range applies only to samples taken after fasting for at least 8 hours.  CBG monitoring, ED     Status: Abnormal   Collection Time: 12/29/23 11:45 AM  Result Value Ref Range   Glucose-Capillary 118 (H) 70 - 99 mg/dL    Comment: Glucose reference range applies only to samples taken after fasting for at least 8 hours.  Glucose, capillary     Status: Abnormal   Collection Time: 12/29/23  4:52 PM  Result Value Ref Range   Glucose-Capillary 136 (H) 70 - 99 mg/dL    Comment: Glucose reference range applies only to samples taken after fasting for at least 8 hours.  Glucose, capillary     Status: Abnormal   Collection Time: 12/29/23  9:35 PM  Result Value Ref Range   Glucose-Capillary 138 (H) 70 - 99 mg/dL    Comment: Glucose reference range applies only to samples taken after fasting for at least 8 hours.  CBC with Differential/Platelet     Status: Abnormal   Collection Time: 12/30/23  4:41 AM  Result Value Ref Range   WBC 11.0 (H) 4.0 - 10.5 K/uL   RBC 3.78 (L) 3.87 - 5.11 MIL/uL   Hemoglobin 11.1 (L) 12.0 - 15.0 g/dL   HCT 67.5 (L) 63.9 - 53.9 %   MCV 85.7 80.0 - 100.0 fL   MCH 29.4 26.0 - 34.0 pg   MCHC 34.3 30.0 - 36.0 g/dL   RDW 84.4 88.4 - 84.4 %   Platelets 237 150 - 400 K/uL   nRBC 0.0 0.0 - 0.2 %   Neutrophils Relative % 87 %   Neutro Abs 9.5 (H) 1.7 - 7.7 K/uL   Lymphocytes Relative 3 %   Lymphs Abs 0.4 (L) 0.7 - 4.0 K/uL   Monocytes Relative 9 %   Monocytes Absolute 1.0 0.1 - 1.0 K/uL   Eosinophils Relative 0 %   Eosinophils Absolute 0.0 0.0 - 0.5 K/uL   Basophils Relative 0 %   Basophils Absolute 0.0 0.0 - 0.1 K/uL   Immature Granulocytes 1 %   Abs Immature Granulocytes 0.14 (H) 0.00 - 0.07 K/uL    Comment: Performed at [facility], [address], [place], [place] [address]  Comprehensive metabolic panel     Status: Abnormal   Collection Time: 12/30/23  4:41 AM  Result Value Ref Range    Sodium 134 (L) 135 - 145 mmol/L   Potassium 3.3 (L) 3.5 - 5.1 mmol/L   Chloride 95 (L) 98 - 111 mmol/L   CO2 30 22 - 32 mmol/L   Glucose, Bld 107 (H) 70 - 99 mg/dL    Comment: Glucose reference range applies only to samples taken after fasting for at least 8 hours.   BUN 18 8 - 23 mg/dL   Creatinine, Ser 9.08 0.44 - 1.00 mg/dL   Calcium  9.4 8.9 - 10.3 mg/dL   Total Protein 5.8 (L) 6.5 - 8.1 g/dL   Albumin 3.4 (L) 3.5 - 5.0 g/dL   AST 62 (H) 15 - 41 U/L   ALT 40 0 - 44 U/L   Alkaline Phosphatase 122 38 - 126 U/L   Total Bilirubin 0.8 0.0 - 1.2 mg/dL   GFR, Estimated >39 >39 mL/min    Comment: (NOTE) Calculated using the CKD-EPI Creatinine Equation (2021)    Anion gap 9 5 - 15    Comment: Performed  at [facility], [address] [place], [place] [address]  Magnesium      Status: None   Collection Time: 12/30/23  4:41 AM  Result Value Ref Range   Magnesium  1.7 1.7 - 2.4 mg/dL    Comment: Performed at [facility], [address] [place], [place] [address]  Phosphorus     Status: None   Collection Time: 12/30/23  4:41 AM  Result Value Ref Range   Phosphorus 4.0 2.5 - 4.6 mg/dL    Comment: Performed at [facility] Lab, [address] [place], [place] [address]  Glucose, capillary     Status: Abnormal   Collection Time: 12/30/23  7:42 AM  Result Value Ref Range   Glucose-Capillary 102 (H) 70 - 99 mg/dL    Comment: Glucose reference range applies only to samples taken after fasting for at least 8 hours.    ECG   N/A  Telemetry   Sinus rhythm - Personally Reviewed  Radiology    ECHOCARDIOGRAM COMPLETE Result Date: 12/29/2023    ECHOCARDIOGRAM REPORT   Patient Name:   Deborah Bryant Date of Exam: 12/29/2023 Medical Rec #:  [id number]      Height:       62.0 in Accession #:    [id number]     Weight:       187.0 lb Date of Birth:  August 05, 1946       BSA:          1.858 m Patient Age:    77 years       BP:           201/191 mmHg Patient Gender: F              HR:            81 bpm. Exam Location:  Inpatient Procedure: 2D Echo, Cardiac Doppler and Color Doppler Indications:    CHF-Acute Systolic I50.21  History:        Patient has prior history of Echocardiogram examinations, most                 recent 09/08/2023. Risk Factors:Hypertension and Diabetes.  Sonographer:    Tinnie Gosling RDCS Referring Phys: [id number] ALLISON WOLFE IMPRESSIONS  1. Left ventricular ejection fraction, by estimation, is 60 to 65%. The left ventricle has normal function. The left ventricle has no regional wall motion abnormalities. Left ventricular diastolic parameters are consistent with Grade I diastolic dysfunction (impaired relaxation).  2. Right ventricular systolic function is normal. The right ventricular size is normal. There is moderately elevated pulmonary artery systolic pressure.  3. Left atrial size was mildly dilated.  4. The mitral valve is grossly normal. Mild mitral valve regurgitation.  5. The aortic valve was not well visualized. Aortic valve regurgitation is not visualized.  6. The inferior vena cava is dilated in size with <50% respiratory variability, suggesting right atrial pressure of 15 mmHg. FINDINGS  Left Ventricle: Left ventricular ejection fraction, by estimation, is 60 to 65%. The left ventricle has normal function. The left ventricle has no regional wall motion abnormalities. The left ventricular internal cavity size was normal in size. There is  no left ventricular hypertrophy. Left ventricular diastolic parameters are consistent with Grade I diastolic dysfunction (impaired relaxation). Right Ventricle: The right ventricular size is normal. Right ventricular systolic function is normal. There is moderately elevated pulmonary artery systolic pressure. The tricuspid regurgitant velocity is 2.74 m/s, and with an assumed right atrial pressure  of 15 mmHg, the estimated right ventricular systolic pressure is 45.0 mmHg. Left Atrium: Left atrial size was mildly dilated. Right Atrium:  Right atrial size was normal in size. Pericardium: There is no evidence of pericardial effusion. Mitral Valve: The mitral valve is grossly normal. Mild mitral valve regurgitation. Tricuspid Valve: Tricuspid valve regurgitation is mild. Aortic Valve: The aortic valve was not well visualized. Aortic valve regurgitation is not visualized. Pulmonic Valve: Pulmonic valve regurgitation is not visualized. Aorta: The aortic root and ascending aorta are structurally normal, with no evidence of dilitation. Venous: The inferior vena cava is dilated in size with less than 50% respiratory variability, suggesting right atrial pressure of 15 mmHg. IAS/Shunts: The interatrial septum was not well visualized.  LEFT VENTRICLE PLAX 2D LVIDd:         5.60 cm   Diastology LVIDs:         3.50 cm   LV e' medial:    9.36 cm/s LV PW:         0.80 cm   LV E/e' medial:  12.4 LV IVS:        0.80 cm   LV e' lateral:   9.36 cm/s LVOT diam:     1.90 cm   LV E/e' lateral: 12.4 LV SV:         104 LV SV Index:   56 LVOT Area:     2.84 cm  RIGHT VENTRICLE         IVC TAPSE (M-mode): 2.4 cm  IVC diam: 2.60 cm LEFT ATRIUM             Index        RIGHT ATRIUM           Index LA diam:        4.40 cm 2.37 cm/m   RA Area:     19.00 cm LA Vol (A2C):   88.2 ml 47.47 ml/m  RA Volume:   55.90 ml  30.09 ml/m LA Vol (A4C):   72.3 ml 38.92 ml/m LA Biplane Vol: 83.8 ml 45.11 ml/m  AORTIC VALVE LVOT Vmax:   136.00 cm/s LVOT Vmean:  104.000 cm/s LVOT VTI:    0.368 m  AORTA Ao Root diam: 2.60 cm Ao Asc diam:  2.70 cm MITRAL VALVE                TRICUSPID VALVE MV Area (PHT): 4.57 cm     TR Peak grad:   30.0 mmHg MV Decel Time: 166 msec     TR Vmax:        274.00 cm/s MV E velocity: 116.00 cm/s MV A velocity: 125.00 cm/s  SHUNTS MV E/A ratio:  0.93         Systemic VTI:  0.37 m                             Systemic Diam: 1.90 cm Ronal Ross Electronically signed by Ronal Ross Signature Date/Time: 12/29/2023/10:06:47 AM    Final    CT HEAD WO CONTRAST  ( ) Result Date: 12/28/2023 CLINICAL DATA:  Delirium. EXAM: CT HEAD WITHOUT CONTRAST TECHNIQUE: Contiguous axial images were obtained from the base of the skull through the vertex without intravenous contrast. RADIATION DOSE REDUCTION: This exam was performed according to the departmental dose-optimization program which includes automated exposure control, adjustment of the mA and/or kV according to patient size and/or use of iterative reconstruction technique. COMPARISON:  Brain MRI  11/01/2018 FINDINGS: Brain: No intracranial hemorrhage, mass effect, or midline shift. Brain volume is normal for age. No hydrocephalus. The basilar cisterns are patent. Mild periventricular and deep white matter hypodensity typical of chronic small vessel ischemia. No evidence of territorial infarct or acute ischemia. No extra-axial or intracranial fluid collection. Vascular: No hyperdense vessel or unexpected calcification. Skull: No fracture or focal lesion. Sinuses/Orbits: Paranasal sinuses and mastoid air cells are clear. The visualized orbits are unremarkable. Other: None. IMPRESSION: 1.  No acute intracranial abnormality. 2. Mild for age chronic small vessel ischemic change. Electronically Signed   By: Andrea Gasman M.D.   On: 12/28/2023 20:29   DG Chest Port 1 View Result Date: 12/28/2023 CLINICAL DATA:  Shortness of breath and bradycardia. EXAM: PORTABLE CHEST 1 VIEW COMPARISON:  Chest x-ray dated November 02, 2018. FINDINGS: The heart size and mediastinal contours are within normal limits. Chronic interstitial coarsening is similar to prior study and likely smoking-related. Dense left lower lobe consolidation with small left pleural effusion. Clear right lung. No pneumothorax. No acute osseous abnormality. IMPRESSION: 1. Left lower lobe pneumonia with small left pleural effusion. Followup PA and lateral chest X-ray is recommended in 3-4 weeks following trial of antibiotic therapy to ensure resolution. Electronically  Signed   By: Elsie ONEIDA Shoulder M.D.   On: 12/28/2023 14:05    Cardiac Studies   Echo above  Assessment   Principal Problem:   Acute respiratory failure with hypoxia (HCC) Active Problems:   Diabetes mellitus without complication (HCC)   Hyperlipemia   Gout   Essential hypertension   Acute renal failure superimposed on stage 2 chronic kidney disease (HCC)   Seronegative inflammatory arthritis   Hyponatremia   Chronic pain   Prolonged QT interval   Acute on chronic diastolic CHF (congestive heart failure) (HCC)   Left lower lobe pneumonia   Acute encephalopathy   Hypothermia   Plan   Good diuresis overnight with some improvement in breathing. Echo shows preserved LV function, however moderate pulmonary hypertension and increased right heart pressures. Creatinine improved to 0.9 today (from 1.2) with diuresis - would continue diuresis today.  Time Spent Directly with Patient:  I have spent a total of 25 minutes with the patient reviewing hospital notes, telemetry, EKGs, labs and examining the patient as well as establishing an assessment and plan that was discussed personally with the patient.  > 50% of time was spent in direct patient care.  Length of Stay:  LOS: 2 days   Vinie KYM Maxcy, MD, [facility], FACP  [place]  [facility]  Medical Director of the [facility]  [facility] Diplomate of the American Board of Clinical Lipidology Attending Cardiologist  Direct Dial: [phone]  Fax: [phone]  Website:  [email or web address] Vinie BROCKS Saiquan Hands 12/30/2023, 10:11 AM

## 2023-12-30 NOTE — Progress Notes (Signed)
 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  Also complained of orthopnea and dyspnea on exertion.  In the emergency room temperature 93.8, blood pressure stable.  Heart rate 48.  Oxygen  saturation less than 90%, required 10 L high flow nasal cannula oxygen  to keep saturation more than 92%.  WBC count normal.  Hemoglobin normal.  Sodium 127, creatinine 1.59, troponin 32.  BNP 1370.  Chest x-ray with left lower lobe airspace disease and small pleural effusion.  Started on sepsis protocol, steroids DuoNebs, antibiotics and admitted to the hospital.  Also seen by cardiology.  Subjective:  Patient appears to be mildly confused, appears comfortable, reports she had a poor night sleep, reports dyspnea and cough.   Assessment & Plan:   Acute respiratory failure with hypoxemia Acute on chronic diastolic CHF Presumed pneumonia -Patient with room air at baseline, she presents with dyspnea, significant oxygen  requirement, initially on 2 L oxygen , down to 5 L today -Significant for volume overload, known chronic diastolic CHF, appears decompensated, repeat 2D echo showing preserved LV function, but evidence of diastolic dysfunction -Continue with IV Lasix  at current dose, -1.1 L over last 24 hours -Renal function remained stable, continue at current dose -Weights, strict ins and out, fluid restriction -Continue with IV Rocephin  and azithromycin  - mucolytic's and bronchodilators. -Follow on sputum cultures, blood cultures, Legionella and streptococcal antigen. -Was encouraged to use incentive spirometry and flutter valve COVID, influenza, extended respiratory virus  panel negative.  Sinus bradycardia:  -Suspected medication induced, hypothermia.  On Cardizem  at home.  Discontinued.  Heart rate is more than 60 today.  AKI/hyponatremia:  -Function and sodium improving with diuresis   Hypokalemia -Replaced, continue to monitor closely as on significant dose IV diuresis  Acute metabolic encephalopathy:  -Likely due to hypoxemia, hypothermia and suspected infection.  CT scan negative.  Treating for pneumonia.  Will resume her long-term medications including low-dose benzodiazepines and opiates to avoid withdrawals.  Type 2 diabetes:  -Well-controlled.  A1c 6.4.  On glipizide  at home.  Currently on sliding scale insulin .    DVT prophylaxis: heparin  injection 5,000 Units Start: 12/28/23 2200 Place TED hose Start: 12/28/23 1806   Code Status: Full code Family Communication: None at bedside, I left her daughter a voicemail Disposition Plan: Status is: Inpatient Remains inpatient appropriate because: Significant illness, IV diuresis, IV antibiotics     Consultants:  Cardiology  Procedures:  None  Antimicrobials:  Ceftriaxone  and doxycycline  1/12---     Objective: Vitals:   12/30/23 0643 12/30/23 0700 12/30/23 0745 12/30/23 1324  BP: (!) 164/63  (!) 176/84   Pulse: (!) 59  62 65  Resp: 15 14  (!) 21  Temp:      TempSrc:      SpO2: 97%  96% 93%  Weight:      Height:       No intake or output data in the 24 hours ending 12/30/23 1437 Filed Weights   12/28/23 1314  Weight: 84.8 kg    Examination:   She is more awake and appropriate today, down to 5 L oxygen  Symmetrical Chest wall movement, has some scattered Rales in the left lung RRR,No Gallops,Rubs or new  Murmurs, No Parasternal Heave +ve B.Sounds, Abd Soft, No tenderness, No rebound - guarding or rigidity. No Cyanosis, Clubbing or edema, No new Rash or bruise     Data Reviewed: I have personally reviewed following labs and imaging studies  CBC: Recent Labs  Lab  12/28/23 1359 12/28/23 1939 12/29/23 0456 12/30/23 0441  WBC 11.1*  --  9.8 11.0*  NEUTROABS 9.6*  --   --  9.5*  HGB 11.7* 12.2 11.6* 11.1*  HCT 35.3* 36.0 35.0* 32.4*  MCV 86.7  --  86.0 85.7  PLT 221  --  215 237   Basic Metabolic Panel: Recent Labs  Lab 12/28/23 1359 12/28/23 1746 12/28/23 1939 12/29/23 0456 12/30/23 0441  NA 127*  --  125* 128* 134*  K 4.3  --  4.6 4.1 3.3*  CL 92*  --   --  93* 95*  CO2 22  --   --  21* 30  GLUCOSE 112*  --   --  112* 107*  BUN 30*  --   --  27* 18  CREATININE 1.59*  --   --  1.20* 0.91  CALCIUM  9.6  --   --  9.6 9.4  MG  --  1.9  --   --  1.7  PHOS  --   --   --   --  4.0   GFR: Estimated Creatinine Clearance: 52.3 mL/min (by C-G formula based on SCr of 0.91 mg/dL). Liver Function Tests: Recent Labs  Lab 12/28/23 1359 12/30/23 0441  AST 34 62*  ALT 31 40  ALKPHOS 139* 122  BILITOT 0.7 0.8  PROT 6.5 5.8*  ALBUMIN 3.8 3.4*   No results for input(s): LIPASE, AMYLASE in the last 168 hours. No results for input(s): AMMONIA in the last 168 hours. Coagulation Profile: Recent Labs  Lab 12/28/23 1359  INR 1.2   Cardiac Enzymes: No results for input(s): CKTOTAL, CKMB, CKMBINDEX, TROPONINI in the last 168 hours. BNP (last 3 results) Recent Labs    08/19/23 1145  PROBNP 237.0*   HbA1C: No results for input(s): HGBA1C in the last 72 hours. CBG: Recent Labs  Lab 12/29/23 1145 12/29/23 1652 12/29/23 2135 12/30/23 0742 12/30/23 1206  GLUCAP 118* 136* 138* 102* 51*   Lipid Profile: No results for input(s): CHOL, HDL, LDLCALC, TRIG, CHOLHDL, LDLDIRECT in the last 72 hours. Thyroid  Function Tests: Recent Labs    12/28/23 1746  TSH 5.477*   Anemia Panel: Recent Labs    12/28/23 1920  VITAMINB12 535   Sepsis Labs: Recent Labs  Lab 12/28/23 1425 12/28/23 1552 12/29/23 0456  PROCALCITON  --  <0.10 <0.10  LATICACIDVEN 1.9  --   --     Recent Results (from the past 240 hours)   Resp panel by RT-PCR (RSV, Flu A&B, Covid) Anterior Nasal Swab     Status: None   Collection Time: 12/28/23  1:59 PM   Specimen: Anterior Nasal Swab  Result Value Ref Range Status   SARS Coronavirus 2 by RT PCR NEGATIVE NEGATIVE Final   Influenza A by PCR NEGATIVE NEGATIVE Final   Influenza B by PCR NEGATIVE NEGATIVE Final    Comment: (NOTE) The Xpert Xpress SARS-CoV-2/FLU/RSV plus assay is intended as an aid in the diagnosis of influenza from Nasopharyngeal swab specimens and should not be used as a sole basis for treatment. Nasal washings and aspirates are unacceptable for Xpert Xpress SARS-CoV-2/FLU/RSV testing.  Fact Sheet for Patients: bloggercourse.com  Fact Sheet for Healthcare Providers: seriousbroker.it  This test is not yet approved or cleared by the [place]  FDA and has been authorized for detection and/or diagnosis of SARS-CoV-2 by FDA under an Emergency Use Authorization (EUA). This EUA will remain in effect (meaning this test can be used) for the duration of the COVID-19 declaration under Section 564(b)(1) of the Act, 21 U.S.C. section 360bbb-3(b)(1), unless the authorization is terminated or revoked.     Resp Syncytial Virus by PCR NEGATIVE NEGATIVE Final    Comment: (NOTE) Fact Sheet for Patients: bloggercourse.com  Fact Sheet for Healthcare Providers: seriousbroker.it  This test is not yet approved or cleared by the [place]  FDA and has been authorized for detection and/or diagnosis of SARS-CoV-2 by FDA under an Emergency Use Authorization (EUA). This EUA will remain in effect (meaning this test can be used) for the duration of the COVID-19 declaration under Section 564(b)(1) of the Act, 21 U.S.C. section 360bbb-3(b)(1), unless the authorization is terminated or revoked.  Performed at [facility], [address], [place],  [place] [address]   Respiratory (~20 pathogens) panel by PCR     Status: None   Collection Time: 12/28/23  1:59 PM   Specimen: Nasopharyngeal Swab; Respiratory  Result Value Ref Range Status   Adenovirus NOT DETECTED NOT DETECTED Final   Coronavirus 229E NOT DETECTED NOT DETECTED Final    Comment: (NOTE) The Coronavirus on the Respiratory Panel, DOES NOT test for the novel  Coronavirus (2019 nCoV)    Coronavirus HKU1 NOT DETECTED NOT DETECTED Final   Coronavirus NL63 NOT DETECTED NOT DETECTED Final   Coronavirus OC43 NOT DETECTED NOT DETECTED Final   Metapneumovirus NOT DETECTED NOT DETECTED Final   Rhinovirus / Enterovirus NOT DETECTED NOT DETECTED Final   Influenza A NOT DETECTED NOT DETECTED Final   Influenza B NOT DETECTED NOT DETECTED Final   Parainfluenza Virus 1 NOT DETECTED NOT DETECTED Final   Parainfluenza Virus 2 NOT DETECTED NOT DETECTED Final   Parainfluenza Virus 3 NOT DETECTED NOT DETECTED Final   Parainfluenza Virus 4 NOT DETECTED NOT DETECTED Final   Respiratory Syncytial Virus NOT DETECTED NOT DETECTED Final   Bordetella pertussis NOT DETECTED NOT DETECTED Final   Bordetella Parapertussis NOT DETECTED NOT DETECTED Final   Chlamydophila pneumoniae NOT DETECTED NOT DETECTED Final   Mycoplasma pneumoniae NOT DETECTED NOT DETECTED Final    Comment: Performed at [facility], [address] [place], [place] [address]  Blood Culture (routine x 2)     Status: None (Preliminary result)   Collection Time: 12/28/23  2:16 PM   Specimen: BLOOD  Result Value Ref Range Status   Specimen Description BLOOD BLOOD RIGHT FOREARM  Final   Special Requests   Final    BOTTLES DRAWN AEROBIC AND ANAEROBIC Blood Culture results may not be optimal due to an inadequate volume of blood received in culture bottles   Culture   Final    NO GROWTH 2 DAYS Performed at [facility], [address], [place], [place] [address]    Report Status PENDING  Incomplete  Blood Culture (routine x  2)     Status: None (Preliminary result)   Collection Time: 12/28/23  2:17 PM   Specimen: BLOOD  Result Value Ref Range Status   Specimen Description BLOOD BLOOD LEFT HAND  Final   Special Requests   Final    BOTTLES DRAWN AEROBIC AND ANAEROBIC Blood Culture results may not be optimal due to an inadequate volume of blood received  in culture bottles   Culture   Final    NO GROWTH 2 DAYS Performed at [facility], [address] [place], [place] [address]    Report Status PENDING  Incomplete         Radiology Studies: ECHOCARDIOGRAM COMPLETE Result Date: 12/29/2023    ECHOCARDIOGRAM REPORT   Patient Name:   VERNETTA DIZDAREVIC Date of Exam: 12/29/2023 Medical Rec #:  [id number]      Height:       62.0 in Accession #:    [id number]     Weight:       187.0 lb Date of Birth:  [birth year]       BSA:          1.858 m Patient Age:    77 years       BP:           201/191 mmHg Patient Gender: F              HR:           81 bpm. Exam Location:  Inpatient Procedure: 2D Echo, Cardiac Doppler and Color Doppler Indications:    CHF-Acute Systolic I50.21  History:        Patient has prior history of Echocardiogram examinations, most                 recent 09/08/2023. Risk Factors:Hypertension and Diabetes.  Sonographer:    Tinnie Gosling RDCS Referring Phys: [id number] ALLISON WOLFE IMPRESSIONS  1. Left ventricular ejection fraction, by estimation, is 60 to 65%. The left ventricle has normal function. The left ventricle has no regional wall motion abnormalities. Left ventricular diastolic parameters are consistent with Grade I diastolic dysfunction (impaired relaxation).  2. Right ventricular systolic function is normal. The right ventricular size is normal. There is moderately elevated pulmonary artery systolic pressure.  3. Left atrial size was mildly dilated.  4. The mitral valve is grossly normal. Mild mitral valve regurgitation.  5. The aortic valve was not well visualized. Aortic valve regurgitation is not  visualized.  6. The inferior vena cava is dilated in size with <50% respiratory variability, suggesting right atrial pressure of 15 mmHg. FINDINGS  Left Ventricle: Left ventricular ejection fraction, by estimation, is 60 to 65%. The left ventricle has normal function. The left ventricle has no regional wall motion abnormalities. The left ventricular internal cavity size was normal in size. There is  no left ventricular hypertrophy. Left ventricular diastolic parameters are consistent with Grade I diastolic dysfunction (impaired relaxation). Right Ventricle: The right ventricular size is normal. Right ventricular systolic function is normal. There is moderately elevated pulmonary artery systolic pressure. The tricuspid regurgitant velocity is 2.74 m/s, and with an assumed right atrial pressure of 15 mmHg, the estimated right ventricular systolic pressure is 45.0 mmHg. Left Atrium: Left atrial size was mildly dilated. Right Atrium: Right atrial size was normal in size. Pericardium: There is no evidence of pericardial effusion. Mitral Valve: The mitral valve is grossly normal. Mild mitral valve regurgitation. Tricuspid Valve: Tricuspid valve regurgitation is mild. Aortic Valve: The aortic valve was not well visualized. Aortic valve regurgitation is not visualized. Pulmonic Valve: Pulmonic valve regurgitation is not visualized. Aorta: The aortic root and ascending aorta are structurally normal, with no evidence of dilitation. Venous: The inferior vena cava is dilated in size with less than 50% respiratory variability, suggesting right atrial pressure of 15 mmHg. IAS/Shunts: The interatrial septum was not well visualized.  LEFT VENTRICLE PLAX  2D LVIDd:         5.60 cm   Diastology LVIDs:         3.50 cm   LV e' medial:    9.36 cm/s LV PW:         0.80 cm   LV E/e' medial:  12.4 LV IVS:        0.80 cm   LV e' lateral:   9.36 cm/s LVOT diam:     1.90 cm   LV E/e' lateral: 12.4 LV SV:         104 LV SV Index:   56 LVOT  Area:     2.84 cm  RIGHT VENTRICLE         IVC TAPSE (M-mode): 2.4 cm  IVC diam: 2.60 cm LEFT ATRIUM             Index        RIGHT ATRIUM           Index LA diam:        4.40 cm 2.37 cm/m   RA Area:     19.00 cm LA Vol (A2C):   88.2 ml 47.47 ml/m  RA Volume:   55.90 ml  30.09 ml/m LA Vol (A4C):   72.3 ml 38.92 ml/m LA Biplane Vol: 83.8 ml 45.11 ml/m  AORTIC VALVE LVOT Vmax:   136.00 cm/s LVOT Vmean:  104.000 cm/s LVOT VTI:    0.368 m  AORTA Ao Root diam: 2.60 cm Ao Asc diam:  2.70 cm MITRAL VALVE                TRICUSPID VALVE MV Area (PHT): 4.57 cm     TR Peak grad:   30.0 mmHg MV Decel Time: 166 msec     TR Vmax:        274.00 cm/s MV E velocity: 116.00 cm/s MV A velocity: 125.00 cm/s  SHUNTS MV E/A ratio:  0.93         Systemic VTI:  0.37 m                             Systemic Diam: 1.90 cm Ronal Ross Electronically signed by Ronal Ross Signature Date/Time: 12/29/2023/10:06:47 AM    Final    CT HEAD WO CONTRAST ( ) Result Date: 12/28/2023 CLINICAL DATA:  Delirium. EXAM: CT HEAD WITHOUT CONTRAST TECHNIQUE: Contiguous axial images were obtained from the base of the skull through the vertex without intravenous contrast. RADIATION DOSE REDUCTION: This exam was performed according to the departmental dose-optimization program which includes automated exposure control, adjustment of the mA and/or kV according to patient size and/or use of iterative reconstruction technique. COMPARISON:  Brain MRI 11/01/2018 FINDINGS: Brain: No intracranial hemorrhage, mass effect, or midline shift. Brain volume is normal for age. No hydrocephalus. The basilar cisterns are patent. Mild periventricular and deep white matter hypodensity typical of chronic small vessel ischemia. No evidence of territorial infarct or acute ischemia. No extra-axial or intracranial fluid collection. Vascular: No hyperdense vessel or unexpected calcification. Skull: No fracture or focal lesion. Sinuses/Orbits: Paranasal sinuses and mastoid air  cells are clear. The visualized orbits are unremarkable. Other: None. IMPRESSION: 1.  No acute intracranial abnormality. 2. Mild for age chronic small vessel ischemic change. Electronically Signed   By: Andrea Gasman M.D.   On: 12/28/2023 20:29        Scheduled Meds:  allopurinol   300 mg Oral Daily  aspirin  EC  81 mg Oral Daily   atorvastatin   40 mg Oral Daily   doxycycline   100 mg Oral Q12H   furosemide   40 mg Intravenous Q12H   heparin   5,000 Units Subcutaneous Q8H   hydroxychloroquine   200 mg Oral BID   insulin  aspart  0-9 Units Subcutaneous TID WC   Continuous Infusions:  cefTRIAXone  (ROCEPHIN )  IV 1 g (12/30/23 0820)     LOS: 2 days      Brayton Lye, MD [facility]

## 2023-12-31 ENCOUNTER — Inpatient Hospital Stay (HOSPITAL_COMMUNITY): Payer: Medicare Other

## 2023-12-31 DIAGNOSIS — J189 Pneumonia, unspecified organism: Secondary | ICD-10-CM

## 2023-12-31 DIAGNOSIS — M1380 Other specified arthritis, unspecified site: Secondary | ICD-10-CM

## 2023-12-31 DIAGNOSIS — J9601 Acute respiratory failure with hypoxia: Secondary | ICD-10-CM | POA: Diagnosis not present

## 2023-12-31 DIAGNOSIS — G894 Chronic pain syndrome: Secondary | ICD-10-CM

## 2023-12-31 DIAGNOSIS — I5033 Acute on chronic diastolic (congestive) heart failure: Secondary | ICD-10-CM

## 2023-12-31 LAB — BASIC METABOLIC PANEL
Anion gap: 7 (ref 5–15)
BUN: 18 mg/dL (ref 8–23)
CO2: 30 mmol/L (ref 22–32)
Calcium: 9.2 mg/dL (ref 8.9–10.3)
Chloride: 98 mmol/L (ref 98–111)
Creatinine, Ser: 0.94 mg/dL (ref 0.44–1.00)
GFR, Estimated: >60 mL/min (ref >60)
Glucose, Bld: 80 mg/dL (ref 70–99)
Potassium: 4.3 mmol/L (ref 3.5–5.1)
Sodium: 135 mmol/L (ref 135–145)

## 2023-12-31 LAB — CBC
HCT: 36.4 % (ref 36.0–46.0)
Hemoglobin: 11.9 g/dL — ABNORMAL LOW (ref 12.0–15.0)
MCH: 28.8 pg (ref 26.0–34.0)
MCHC: 32.7 g/dL (ref 30.0–36.0)
MCV: 88.1 fL (ref 80.0–100.0)
Platelets: 255 10*3/uL (ref 150–400)
RBC: 4.13 MIL/uL (ref 3.87–5.11)
RDW: 15.7 % — ABNORMAL HIGH (ref 11.5–15.5)
WBC: 12.3 10*3/uL — ABNORMAL HIGH (ref 4.0–10.5)
nRBC: 0 % (ref 0.0–0.2)

## 2023-12-31 LAB — GLUCOSE, CAPILLARY
Glucose-Capillary: 81 mg/dL (ref 70–99)
Glucose-Capillary: 115 mg/dL — ABNORMAL HIGH (ref 70–99)
Glucose-Capillary: 139 mg/dL — ABNORMAL HIGH (ref 70–99)

## 2023-12-31 MED ORDER — BENZONATATE 100 MG PO CAPS
200.0000 mg | ORAL_CAPSULE | Freq: Three times a day (TID) | ORAL | Status: DC | PRN
  Administered 2024-01-01: 200 mg via ORAL
  Filled 2023-12-31: qty 2

## 2023-12-31 MED ORDER — FUROSEMIDE 40 MG PO TABS
80.0000 mg | ORAL_TABLET | Freq: Every day | ORAL | Status: DC
  Administered 2024-01-01 – 2024-01-03 (×3): 80 mg via ORAL
  Filled 2023-12-31 (×3): qty 2

## 2023-12-31 NOTE — Evaluation (Signed)
 Clinical/Bedside Swallow Evaluation Patient Details  Name: Deborah Bryant MRN: [id number] Date of Birth: Apr 28, 1946  Today's Date: 12/31/2023 Time: SLP Start Time (ACUTE ONLY): 1236 SLP Stop Time (ACUTE ONLY): 1244 SLP Time Calculation (min) (ACUTE ONLY): 8 min  Past Medical History:  Past Medical History:  Diagnosis Date   Allergic rhinitis    Anxiety    Arthritis    sees Dr. Stefan Edge    Cellulitis, periorbital 2006   left eye, due to MRSA, saw Dr. Archer Kobs    Chronic kidney disease    sees Dr. Nicolas Barren   Complication of anesthesia    COPD (chronic obstructive pulmonary disease) (HCC)    Cough    Diabetes mellitus    Dyspnea    with exertion   GERD (gastroesophageal reflux disease)    OTC  Pepcid    Hemorrhoids    Hyperlipidemia    Hypertension    IBS (irritable bowel syndrome)    Loss of smell    Loss of taste    Memory changes    Menopause    MRSA (methicillin resistant Staphylococcus aureus) 2006   left orbit    Pneumonia    PONV (postoperative nausea and vomiting)    Tubular adenoma of colon 12/2006   Past Surgical History:  Past Surgical History:  Procedure Laterality Date   APPENDECTOMY     BACK SURGERY     COLONOSCOPY  04-01-12   per Dr. Sandrea Cruel, tubular adenoma, repeat in 5 yrs    CYSTOSCOPY WITH BIOPSY N/A 12/11/2021   Procedure: CYSTOSCOPY WITH BIOPSY;  Surgeon: Roxane Copp, MD;  Location: [facility];  Service: Urology;  Laterality: N/A;   CYSTOSCOPY WITH FULGERATION N/A 12/11/2021   Procedure: CYSTOSCOPY WITH FULGERATION;  Surgeon: Roxane Copp, MD;  Location: [facility];  Service: Urology;  Laterality: N/A;   POLYPECTOMY     TUBAL LIGATION     HPI:  Deborah Bryant is a 78 yo female presenting to ED 1/12 with hypoxia, bradycardia, and AMS. CXR shows persistent atelectasis and/or consolidation in the L lower lobe with small L pleural effusion. PMH includes PNA, MRSA 2006, HTN, HLD, GERD, T2DM, dyspnea, COPD, CKD, GERD    Assessment /  Plan / Recommendation  Clinical Impression  Pt reports no concerns with swallowing and states she avoids straws due to personal preference. She denies a significant history of esophageal reflux. Observed trials of thin liquids and solids without overt s/s of dysphagia or aspiration. Recommend pt continue her regular texture diet without further SLP f/u needed. SLP Visit Diagnosis: Dysphagia, unspecified (R13.10)    Aspiration Risk  Mild aspiration risk    Diet Recommendation Regular;Thin liquid    Liquid Administration via: Cup Medication Administration: Whole meds with liquid Supervision: Patient able to self feed Compensations: Slow rate;Small sips/bites Postural Changes: Seated upright at 90 degrees    Other  Recommendations Oral Care Recommendations: Oral care BID    Recommendations for follow up therapy are one component of a multi-disciplinary discharge planning process, led by the attending physician.  Recommendations may be updated based on patient status, additional functional criteria and insurance authorization.  Follow up Recommendations No SLP follow up      Assistance Recommended at Discharge    Functional Status Assessment Patient has not had a recent decline in their functional status  Frequency and Duration            Prognosis Prognosis for improved oropharyngeal function: Good  Swallow Study   General HPI: GIANELLE WEISNER is a 78 yo female presenting to ED 1/12 with hypoxia, bradycardia, and AMS. CXR shows persistent atelectasis and/or consolidation in the L lower lobe with small L pleural effusion. PMH includes PNA, MRSA 2006, HTN, HLD, GERD, T2DM, dyspnea, COPD, CKD, GERD Type of Study: Bedside Swallow Evaluation Previous Swallow Assessment: none in chart Diet Prior to this Study: Regular;Thin liquids (Level 0) Temperature Spikes Noted: No Respiratory Status: Nasal cannula History of Recent Intubation: No Behavior/Cognition:  Alert;Cooperative;Pleasant mood Oral Cavity Assessment: Within Functional Limits Oral Care Completed by SLP: No Oral Cavity - Dentition: Adequate natural dentition Vision: Functional for self-feeding Self-Feeding Abilities: Able to feed self Patient Positioning: Upright in bed Baseline Vocal Quality: Normal Volitional Cough: Strong Volitional Swallow: Able to elicit    Oral/Motor/Sensory Function Overall Oral Motor/Sensory Function: Within functional limits   Ice Chips Ice chips: Not tested   Thin Liquid Thin Liquid: Within functional limits Presentation: Cup;Self Fed    Nectar Thick Nectar Thick Liquid: Not tested   Honey Thick Honey Thick Liquid: Not tested   Puree Puree: Not tested   Solid     Solid: Within functional limits Presentation: Self Fed      Amil Kale, M.A., CF-SLP Speech Language Pathology, Acute Rehabilitation Services  Secure Chat preferred [phone]  12/31/2023,1:04 PM

## 2023-12-31 NOTE — Progress Notes (Signed)
 Physical Therapy Treatment Patient Details Name: Deborah Bryant MRN: [id number] DOB: 21-May-1946 Today's Date: 12/31/2023   History of Present Illness Pt is 78 yo female who presents on 12/28/23 with hypoxia, bradycardia, and AMS. PMH: pneumonia, MRSA 2006, HTN, HLD, GERD, DM, dyspnea, COPD, CKD, L periorbital cellulitis, L4-5 fusion, chronic pain    PT Comments  Pt received in supine and agreeable to session despite recently returning to bed. Pt able to tolerate increased gait distance on 4L O2 with SpO2 remaining >90%. Pt requires 1 standing rest break due to slight DOE, however pt reports improved DOE compared to previous trials. Pt able to complete x5 serial STS without UE support with no LOB and slight DOE. Pt continues to benefit from PT services to progress toward functional mobility goals.     If plan is discharge home, recommend the following: A little help with walking and/or transfers;A little help with bathing/dressing/bathroom;Assist for transportation;Assistance with cooking/housework   Can travel by private vehicle        Equipment Recommendations  None recommended by PT    Recommendations for Other Services       Precautions / Restrictions Precautions Precautions: Other (comment) Precaution Comments: watch O2 sats Restrictions Weight Bearing Restrictions Per Provider Order: No     Mobility  Bed Mobility Overal bed mobility: Modified Independent Bed Mobility: Supine to Sit, Sit to Supine           General bed mobility comments: increased time    Transfers Overall transfer level: Needs assistance Equipment used: Rolling walker (2 wheels) Transfers: Sit to/from Stand Sit to Stand: Supervision           General transfer comment: STS from low EOB with and without UE support with supervision for safety    Ambulation/Gait Ambulation/Gait assistance: Contact guard assist Gait Distance (Feet): 60 Feet Assistive device: Rolling walker (2 wheels) Gait  Pattern/deviations: Step-through pattern, Trunk flexed, Decreased stride length       General Gait Details: slow step-through pattern with slight trunk flexion. Cues for upright posture. 1 standing rest break due to some shortness of breath.      Balance Overall balance assessment: Needs assistance Sitting-balance support: No upper extremity supported, Feet supported Sitting balance-Leahy Scale: Good     Standing balance support: No upper extremity supported, During functional activity Standing balance-Leahy Scale: Fair Standing balance comment: with RW support. Able to static stand without UE support with no LOB                            Cognition Arousal: Alert Behavior During Therapy: WFL for tasks assessed/performed Overall Cognitive Status: Within Functional Limits for tasks assessed                                          Exercises Other Exercises Other Exercises: x5 STS without UE support    General Comments General comments (skin integrity, edema, etc.): Pt on 4L throughout ambulation with SpO2 >90% and cues for pursed lip breathing      Pertinent Vitals/Pain Pain Assessment Pain Assessment: Faces Pain Score: 0-No pain     PT Goals (current goals can now be found in the care plan section) Acute Rehab PT Goals Patient Stated Goal: return home PT Goal Formulation: With patient/family Time For Goal Achievement: 01/12/24 Progress towards PT goals: Progressing toward goals  Frequency    Min 1X/week       AM-PAC PT "6 Clicks" Mobility   Outcome Measure  Help needed turning from your back to your side while in a flat bed without using bedrails?: A Little Help needed moving from lying on your back to sitting on the side of a flat bed without using bedrails?: A Little Help needed moving to and from a bed to a chair (including a wheelchair)?: A Little Help needed standing up from a chair using your arms (e.g., wheelchair or  bedside chair)?: A Little Help needed to walk in hospital room?: A Little Help needed climbing 3-5 steps with a railing? : A Lot 6 Click Score: 17    End of Session Equipment Utilized During Treatment: Oxygen ;Gait belt Activity Tolerance: Patient tolerated treatment well;Patient limited by fatigue Patient left: in bed;with call bell/phone within reach Nurse Communication: Mobility status PT Visit Diagnosis: Muscle weakness (generalized) (M62.81);Difficulty in walking, not elsewhere classified (R26.2)     Time: 1610-9604 PT Time Calculation (min) (ACUTE ONLY): 19 min  Charges:    $Gait Training: 8-22 mins PT General Charges $$ ACUTE PT VISIT: 1 Visit                     Michaelle Adolphus, PTA Acute Rehabilitation Services Secure Chat Preferred  [phone]    Michaelle Adolphus 12/31/2023, 10:55 AM

## 2023-12-31 NOTE — Progress Notes (Signed)
 DAILY PROGRESS NOTE   Patient Name: Deborah Bryant Date of Encounter: 12/31/2023 Cardiologist: None  Chief Complaint   Breathing is better  Patient Profile   Deborah Bryant is a 78 y.o. female with a hx of HTN, DM2, and chronic pain on opioids who is being seen 12/28/2023 for the evaluation of hypoxia, bradycardia, and altered mental status   Subjective   Output not recorded. Telemetry shows stable sinus bradycardia off of AVN blocking agents. Weight not recorded. She has had a lower oxygen  requirement.  Objective   Vitals:   12/31/23 0700 12/31/23 0744 12/31/23 0747 12/31/23 0800  BP:  (!) 172/69  (!) 177/80  Pulse: (!) 46  62 82  Resp: 12  17 20   Temp:   (!) 96.8 F (36 C)   TempSrc:   Oral   SpO2:    90%  Weight:      Height:        Intake/Output Summary (Last 24 hours) at 12/31/2023 1914 Last data filed at 12/30/2023 1600 Gross per 24 hour  Intake 165.98 ml  Output --  Net 165.98 ml   Filed Weights   12/28/23 1314  Weight: 84.8 kg    Physical Exam   General appearance: alert, no distress, and moderately obese Neck: JVD - 1 cm above sternal notch, no carotid bruit, and thyroid  not enlarged, symmetric, no tenderness/mass/nodules Lungs: diminished breath sounds bilaterally, rales bibasilar, and wheezes bilaterally Heart: regular rate and rhythm Abdomen: soft, non-tender; bowel sounds normal; no masses,  no organomegaly Extremities: edema trace sockline bilaterally Pulses: 2+ and symmetric Skin: Skin color, texture, turgor normal. No rashes or lesions Neurologic: Mental status: Alert, oriented, thought content appropriate Psych: Pleasant  Inpatient Medications    Scheduled Meds:  allopurinol   300 mg Oral Daily   aspirin  EC  81 mg Oral Daily   atorvastatin   40 mg Oral Daily   doxycycline   100 mg Oral Q12H   furosemide   40 mg Intravenous Q12H   heparin   5,000 Units Subcutaneous Q8H   hydroxychloroquine   200 mg Oral BID   insulin  aspart  0-9 Units  Subcutaneous TID WC    Continuous Infusions:  cefTRIAXone  (ROCEPHIN )  IV 1 g (12/31/23 0858)    PRN Meds: acetaminophen  **OR** acetaminophen , albuterol , diazepam , HYDROcodone -acetaminophen    Labs   Results for orders placed or performed during the hospital encounter of 12/28/23 (from the past 48 hours)  CBG monitoring, ED     Status: Abnormal   Collection Time: 12/29/23 11:45 AM  Result Value Ref Range   Glucose-Capillary 118 (H) 70 - 99 mg/dL    Comment: Glucose reference range applies only to samples taken after fasting for at least 8 hours.  Glucose, capillary     Status: Abnormal   Collection Time: 12/29/23  4:52 PM  Result Value Ref Range   Glucose-Capillary 136 (H) 70 - 99 mg/dL    Comment: Glucose reference range applies only to samples taken after fasting for at least 8 hours.  Glucose, capillary     Status: Abnormal   Collection Time: 12/29/23  9:35 PM  Result Value Ref Range   Glucose-Capillary 138 (H) 70 - 99 mg/dL    Comment: Glucose reference range applies only to samples taken after fasting for at least 8 hours.  CBC with Differential/Platelet     Status: Abnormal   Collection Time: 12/30/23  4:41 AM  Result Value Ref Range   WBC 11.0 (H) 4.0 - 10.5 K/uL  RBC 3.78 (L) 3.87 - 5.11 MIL/uL   Hemoglobin 11.1 (L) 12.0 - 15.0 g/dL   HCT 95.6 (L) 38.7 - 56.4 %   MCV 85.7 80.0 - 100.0 fL   MCH 29.4 26.0 - 34.0 pg   MCHC 34.3 30.0 - 36.0 g/dL   RDW 33.2 95.1 - 88.4 %   Platelets 237 150 - 400 K/uL   nRBC 0.0 0.0 - 0.2 %   Neutrophils Relative % 87 %   Neutro Abs 9.5 (H) 1.7 - 7.7 K/uL   Lymphocytes Relative 3 %   Lymphs Abs 0.4 (L) 0.7 - 4.0 K/uL   Monocytes Relative 9 %   Monocytes Absolute 1.0 0.1 - 1.0 K/uL   Eosinophils Relative 0 %   Eosinophils Absolute 0.0 0.0 - 0.5 K/uL   Basophils Relative 0 %   Basophils Absolute 0.0 0.0 - 0.1 K/uL   Immature Granulocytes 1 %   Abs Immature Granulocytes 0.14 (H) 0.00 - 0.07 K/uL    Comment: Performed at [facility], [address], [place], [place] [address]  Comprehensive metabolic panel     Status: Abnormal   Collection Time: 12/30/23  4:41 AM  Result Value Ref Range   Sodium 134 (L) 135 - 145 mmol/L   Potassium 3.3 (L) 3.5 - 5.1 mmol/L   Chloride 95 (L) 98 - 111 mmol/L   CO2 30 22 - 32 mmol/L   Glucose, Bld 107 (H) 70 - 99 mg/dL    Comment: Glucose reference range applies only to samples taken after fasting for at least 8 hours.   BUN 18 8 - 23 mg/dL   Creatinine, Ser 3.01 0.44 - 1.00 mg/dL   Calcium  9.4 8.9 - 10.3 mg/dL   Total Protein 5.8 (L) 6.5 - 8.1 g/dL   Albumin 3.4 (L) 3.5 - 5.0 g/dL   AST 62 (H) 15 - 41 U/L   ALT 40 0 - 44 U/L   Alkaline Phosphatase 122 38 - 126 U/L   Total Bilirubin 0.8 0.0 - 1.2 mg/dL   GFR, Estimated >60 >10 mL/min    Comment: (NOTE) Calculated using the CKD-EPI Creatinine Equation (2021)    Anion gap 9 5 - 15    Comment: Performed at [facility], [address], [place], [place] [address]  Magnesium      Status: None   Collection Time: 12/30/23  4:41 AM  Result Value Ref Range   Magnesium  1.7 1.7 - 2.4 mg/dL    Comment: Performed at [facility], [address] [place], [place] [address]  Phosphorus     Status: None   Collection Time: 12/30/23  4:41 AM  Result Value Ref Range   Phosphorus 4.0 2.5 - 4.6 mg/dL    Comment: Performed at [facility], [address] [place], [place] [address]  Glucose, capillary     Status: Abnormal   Collection Time: 12/30/23  7:42 AM  Result Value Ref Range   Glucose-Capillary 102 (H) 70 - 99 mg/dL    Comment: Glucose reference range applies only to samples taken after fasting for at least 8 hours.  Glucose, capillary     Status: Abnormal   Collection Time: 12/30/23 12:06 PM  Result Value Ref Range   Glucose-Capillary 51 (L) 70 - 99 mg/dL    Comment: Glucose reference range applies only to samples taken after fasting for at least 8 hours.  Glucose, capillary     Status: Abnormal   Collection  Time: 12/30/23  4:29 PM  Result Value Ref Range   Glucose-Capillary 108 (H) 70 - 99 mg/dL    Comment: Glucose reference range applies only to samples taken after fasting for at least 8 hours.  Glucose, capillary     Status: None   Collection Time: 12/30/23 10:14 PM  Result Value Ref Range   Glucose-Capillary 94 70 - 99 mg/dL    Comment: Glucose reference range applies only to samples taken after fasting for at least 8 hours.  CBC     Status: Abnormal   Collection Time: 12/31/23  5:00 AM  Result Value Ref Range   WBC 12.3 (H) 4.0 - 10.5 K/uL   RBC 4.13 3.87 - 5.11 MIL/uL   Hemoglobin 11.9 (L) 12.0 - 15.0 g/dL   HCT 16.1 09.6 - 04.5 %   MCV 88.1 80.0 - 100.0 fL   MCH 28.8 26.0 - 34.0 pg   MCHC 32.7 30.0 - 36.0 g/dL   RDW 40.9 (H) 81.1 - 91.4 %   Platelets 255 150 - 400 K/uL   nRBC 0.0 0.0 - 0.2 %    Comment: Performed at [facility], [address], [place], [place] [address]  Basic metabolic panel     Status: None   Collection Time: 12/31/23  5:00 AM  Result Value Ref Range   Sodium 135 135 - 145 mmol/L   Potassium 4.3 3.5 - 5.1 mmol/L   Chloride 98 98 - 111 mmol/L   CO2 30 22 - 32 mmol/L   Glucose, Bld 80 70 - 99 mg/dL    Comment: Glucose reference range applies only to samples taken after fasting for at least 8 hours.   BUN 18 8 - 23 mg/dL   Creatinine, Ser 6.21 0.44 - 1.00 mg/dL   Calcium  9.2 8.9 - 10.3 mg/dL   GFR, Estimated >30 >86 mL/min    Comment: (NOTE) Calculated using the CKD-EPI Creatinine Equation (2021)    Anion gap 7 5 - 15    Comment: Performed at [facility], [address], [place], [place] [address]  Glucose, capillary     Status: None   Collection Time: 12/31/23  7:49 AM  Result Value Ref Range   Glucose-Capillary 81 70 - 99 mg/dL    Comment: Glucose reference range applies only to samples taken after fasting for at least 8 hours.    ECG   N/A  Telemetry   Sinus rhythm - Personally Reviewed  Radiology    DG Chest Port 1  View Result Date: 12/31/2023 CLINICAL DATA:  78 year old female with history of shortness of breath. Bradycardia. EXAM: PORTABLE CHEST 1 VIEW COMPARISON:  Chest x-ray 12/28/2023. FINDINGS: Lung volumes are normal. Opacity at the left base which may reflect atelectasis and/or consolidation, with superimposed small left pleural effusion. Right lung appears clear. No right pleural effusion. No pneumothorax. No evidence of pulmonary edema. Heart size appears borderline enlarged. Upper mediastinal contours are within normal limits. Atherosclerotic calcifications are noted in the thoracic aorta. IMPRESSION: 1. Persistent atelectasis and/or consolidation in the left lower lobe with small left pleural effusion. 2. Aortic atherosclerosis. Electronically Signed   By: Alexandria Angel M.D.   On: 12/31/2023 06:49   ECHOCARDIOGRAM COMPLETE Result Date: 12/29/2023    ECHOCARDIOGRAM REPORT   Patient Name:   Deborah Bryant Date of Exam: 12/29/2023 Medical Rec #:  [id number]      Height:       62.0 in Accession #:  [id number]     Weight:       187.0 lb Date of Birth:  05-11-1946       BSA:          1.858 m Patient Age:    77 years       BP:           201/191 mmHg Patient Gender: F              HR:           81 bpm. Exam Location:  Inpatient Procedure: 2D Echo, Cardiac Doppler and Color Doppler Indications:    CHF-Acute Systolic I50.21  History:        Patient has prior history of Echocardiogram examinations, most                 recent 09/08/2023. Risk Factors:Hypertension and Diabetes.  Sonographer:    Hersey Lorenzo RDCS Referring Phys: [id number] ALLISON WOLFE IMPRESSIONS  1. Left ventricular ejection fraction, by estimation, is 60 to 65%. The left ventricle has normal function. The left ventricle has no regional wall motion abnormalities. Left ventricular diastolic parameters are consistent with Grade I diastolic dysfunction (impaired relaxation).  2. Right ventricular systolic function is normal. The right ventricular size is  normal. There is moderately elevated pulmonary artery systolic pressure.  3. Left atrial size was mildly dilated.  4. The mitral valve is grossly normal. Mild mitral valve regurgitation.  5. The aortic valve was not well visualized. Aortic valve regurgitation is not visualized.  6. The inferior vena cava is dilated in size with <50% respiratory variability, suggesting right atrial pressure of 15 mmHg. FINDINGS  Left Ventricle: Left ventricular ejection fraction, by estimation, is 60 to 65%. The left ventricle has normal function. The left ventricle has no regional wall motion abnormalities. The left ventricular internal cavity size was normal in size. There is  no left ventricular hypertrophy. Left ventricular diastolic parameters are consistent with Grade I diastolic dysfunction (impaired relaxation). Right Ventricle: The right ventricular size is normal. Right ventricular systolic function is normal. There is moderately elevated pulmonary artery systolic pressure. The tricuspid regurgitant velocity is 2.74 m/s, and with an assumed right atrial pressure of 15 mmHg, the estimated right ventricular systolic pressure is 45.0 mmHg. Left Atrium: Left atrial size was mildly dilated. Right Atrium: Right atrial size was normal in size. Pericardium: There is no evidence of pericardial effusion. Mitral Valve: The mitral valve is grossly normal. Mild mitral valve regurgitation. Tricuspid Valve: Tricuspid valve regurgitation is mild. Aortic Valve: The aortic valve was not well visualized. Aortic valve regurgitation is not visualized. Pulmonic Valve: Pulmonic valve regurgitation is not visualized. Aorta: The aortic root and ascending aorta are structurally normal, with no evidence of dilitation. Venous: The inferior vena cava is dilated in size with less than 50% respiratory variability, suggesting right atrial pressure of 15 mmHg. IAS/Shunts: The interatrial septum was not well visualized.  LEFT VENTRICLE PLAX 2D LVIDd:          5.60 cm   Diastology LVIDs:         3.50 cm   LV e' medial:    9.36 cm/s LV PW:         0.80 cm   LV E/e' medial:  12.4 LV IVS:        0.80 cm   LV e' lateral:   9.36 cm/s LVOT diam:     1.90 cm   LV E/e' lateral: 12.4 LV SV:  104 LV SV Index:   56 LVOT Area:     2.84 cm  RIGHT VENTRICLE         IVC TAPSE (M-mode): 2.4 cm  IVC diam: 2.60 cm LEFT ATRIUM             Index        RIGHT ATRIUM           Index LA diam:        4.40 cm 2.37 cm/m   RA Area:     19.00 cm LA Vol (A2C):   88.2 ml 47.47 ml/m  RA Volume:   55.90 ml  30.09 ml/m LA Vol (A4C):   72.3 ml 38.92 ml/m LA Biplane Vol: 83.8 ml 45.11 ml/m  AORTIC VALVE LVOT Vmax:   136.00 cm/s LVOT Vmean:  104.000 cm/s LVOT VTI:    0.368 m  AORTA Ao Root diam: 2.60 cm Ao Asc diam:  2.70 cm MITRAL VALVE                TRICUSPID VALVE MV Area (PHT): 4.57 cm     TR Peak grad:   30.0 mmHg MV Decel Time: 166 msec     TR Vmax:        274.00 cm/s MV E velocity: 116.00 cm/s MV A velocity: 125.00 cm/s  SHUNTS MV E/A ratio:  0.93         Systemic VTI:  0.37 m                             Systemic Diam: 1.90 cm Alois Arnt Electronically signed by Alois Arnt Signature Date/Time: 12/29/2023/10:06:47 AM    Final     Cardiac Studies   Echo above  Assessment   Principal Problem:   Acute respiratory failure with hypoxia (HCC) Active Problems:   Diabetes mellitus without complication (HCC)   Hyperlipemia   Gout   Essential hypertension   Acute renal failure superimposed on stage 2 chronic kidney disease (HCC)   Seronegative inflammatory arthritis   Hyponatremia   Chronic pain   Prolonged QT interval   Acute on chronic diastolic CHF (congestive heart failure) (HCC)   Left lower lobe pneumonia   Acute encephalopathy   Hypothermia   Plan   Not much recorded today to determine effects of diuresis - no weight or output. Oxygen  requirement has improved. Would ask that diuretic metrics be entered by nursing staff for me to review. Daily weights and  I's/O's are ordered. Appear to be close to euvolemia- could consider switch to oral diuretics, probably tomorrow. BMET normal today.  Time Spent Directly with Patient:  I have spent a total of 25 minutes with the patient reviewing hospital notes, telemetry, EKGs, labs and examining the patient as well as establishing an assessment and plan that was discussed personally with the patient.  > 50% of time was spent in direct patient care.  Length of Stay:  LOS: 3 days   Hazle Lites, MD, [facility], FACP  [place]  [facility]  Medical Director of the [facility]  [facility] Diplomate of the American Board of Clinical Lipidology Attending Cardiologist  Direct Dial: [phone]  Fax: [phone]  Website:  [email or web address] Saida Lonon 12/31/2023, 9:42 AM

## 2023-12-31 NOTE — Progress Notes (Signed)
 PROGRESS NOTE        PATIENT DETAILS Name: Deborah Bryant Age: 78 y.o. Sex: female Date of Birth: 1946/11/13 Admit Date: 12/28/2023 Admitting Physician Raymona Caldwell, MD PCP:Fry, Lenis Quin, MD  Brief Summary: Patient is a 78 y.o.  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  Significant events: 1/12>> admit to [facility]  Significant studies: 1/12>> CXR: Left lower PNA with small pleural effusion 1/12>> CT head: No acute intracranial abnormality 1/13>> echo: EF 60-65%, grade 1 diastolic dysfunction. 1/15>> CXR: Persistent atelectasis/consolidation in the left lower lobe  Significant microbiology data: 1/12>> COVID/influenza/RSV PCR: Negative 1/12>> respiratory virus panel: Negative 1/12>> blood culture: Negative  Procedures: None  Consults: Cardiology  Subjective: Lying comfortably in bed-denies any chest pain or shortness of breath.  Feels better-Down to 2 L of oxygen .  Objective: Vitals: Blood pressure (!) 177/80, pulse 82, temperature (!) 96.8 F (36 C), temperature source Oral, resp. rate 20, height 5\' 2"  (1.575 m), weight 84.8 kg, SpO2 90%.   Exam: Gen Exam:Alert awake-not in any distress HEENT:atraumatic, normocephalic Chest: B/L clear to auscultation anteriorly CVS:S1S2 regular Abdomen:soft non tender, non distended Extremities:+ edema Neurology: Non focal Skin: no rash  Pertinent Labs/Radiology:    Latest Ref Rng & Units 12/31/2023    5:00 AM 12/30/2023    4:41 AM 12/29/2023    4:56 AM  CBC  WBC 4.0 - 10.5 K/uL 12.3  11.0  9.8   Hemoglobin 12.0 - 15.0 g/dL 65.7  84.6  96.2   Hematocrit 36.0 - 46.0 % 36.4  32.4  35.0   Platelets 150 - 400 K/uL 255  237  215     Lab Results  Component Value Date   NA 135 12/31/2023   K 4.3 12/31/2023   CL 98  12/31/2023   CO2 30 12/31/2023      Assessment/Plan: Acute hypoxic respiratory failure Felt to be secondary to PNA/HFpEF exacerbation Improving-Down to 2 L of oxygen  this morning (on initial presentation requiring up to 12 L HFNC) Continue to treat underlying etiologies with antibiotics/diuretics  Acute on chronic HFpEF Still with mild leg edema Weight/intake/output not recorded-have discussed with nursing staff Remains on IV Lasix  Follow closely  LLL PNA Clinically improved-on antibiotics Could have aspirated when she was confused SLP eval to ensure no occult aspiration issues-although I doubt.  Acute metabolic encephalopathy Secondary to PNA/hypothermia/hypoxemia CT imaging negative Improved-completely awake/alert this morning-sister-in-law at bedside.  Hyponatremia Secondary to volume overload-improving with diuretics  AKI Likely hemodynamically mediated Resolved.  Sinus bradycardia Telemetry monitoring TSH minimally elevated-and unlikely causing bradycardia.  Likely due to hypothermia/Cardizem  use (on hold).  HLD Statin  DM-2 (A1c 6.3 on 08/19/2023) CBG stable SSI while inpatient Oral hypoglycemic medications to be resumed on discharge  Recent Labs    12/30/23 1629 12/30/23 2214 12/31/23 0749  GLUCAP 108* 94 81    History of RA Plaquenil   Gout No flare Allopurinol   Chronic pain syndrome As needed narcotics  Class 1 Obesity: Estimated body mass index is 34.2 kg/m as calculated from the following:   Height as of this encounter: 5\' 2"  (1.575 m).   Weight as of this encounter: 84.8 kg.   Code status:   Code Status: Full Code  DVT Prophylaxis: heparin  injection 5,000 Units Start: 12/28/23 2200 Place TED hose Start: 12/28/23 1806   Family Communication: Sister in Social worker (retired [employer] fundraiser) at bedside   Disposition Plan: Status is: Inpatient Remains inpatient appropriate because: Severity of illness   Planned Discharge Destination:Home  health   Diet: Diet Order             Diet heart healthy/carb modified Fluid consistency: Thin  Diet effective now                     Antimicrobial agents: Anti-infectives (From admission, onward)    Start     Dose/Rate Route Frequency Ordered Stop   12/29/23 1000  doxycycline  (VIBRA -TABS) tablet 100 mg        100 mg Oral Every 12 hours 12/28/23 1823     12/29/23 1000  cefTRIAXone  (ROCEPHIN ) 1 g in sodium chloride  0.9 % 100 mL IVPB        1 g 200 mL/hr over 30 Minutes Intravenous Every 24 hours 12/28/23 1823 01/03/24 0959   12/29/23 1000  hydroxychloroquine  (PLAQUENIL ) tablet 200 mg       Note to Pharmacy: Pt reported changed by Rheumatology     200 mg Oral 2 times daily 12/28/23 2029     12/28/23 1430  cefTRIAXone  (ROCEPHIN ) 1 g in sodium chloride  0.9 % 100 mL IVPB        1 g 200 mL/hr over 30 Minutes Intravenous  Once 12/28/23 1418 12/28/23 1502   12/28/23 1430  azithromycin  (ZITHROMAX ) 500 mg in sodium chloride  0.9 % 250 mL IVPB        500 mg 250 mL/hr over 60 Minutes Intravenous  Once 12/28/23 1418 12/28/23 1538        MEDICATIONS: Scheduled Meds:  allopurinol   300 mg Oral Daily   aspirin  EC  81 mg Oral Daily   atorvastatin   40 mg Oral Daily   doxycycline   100 mg Oral Q12H   furosemide   40 mg Intravenous Q12H   [START ON 01/01/2024] furosemide   80 mg Oral Daily   heparin   5,000 Units Subcutaneous Q8H   hydroxychloroquine   200 mg Oral BID   insulin  aspart  0-9 Units Subcutaneous TID WC   Continuous Infusions:  cefTRIAXone  (ROCEPHIN )  IV 1 g (12/31/23 0858)   PRN Meds:.acetaminophen  **OR** acetaminophen , albuterol , diazepam , HYDROcodone -acetaminophen    I have personally reviewed following labs and imaging studies  LABORATORY DATA: CBC: Recent Labs  Lab 12/28/23 1359 12/28/23 1939 12/29/23 0456 12/30/23 0441 12/31/23 0500  WBC 11.1*  --  9.8 11.0* 12.3*  NEUTROABS 9.6*  --   --  9.5*  --   HGB 11.7* 12.2 11.6* 11.1* 11.9*  HCT 35.3* 36.0 35.0*  32.4* 36.4  MCV 86.7  --  86.0 85.7 88.1  PLT 221  --  215 237 255    Basic Metabolic Panel: Recent Labs  Lab 12/28/23 1359 12/28/23 1746 12/28/23 1939 12/29/23 0456 12/30/23 0441 12/31/23 0500  NA 127*  --  125* 128* 134* 135  K 4.3  --  4.6 4.1 3.3* 4.3  CL 92*  --   --  93* 95* 98  CO2 22  --   --  21* 30 30  GLUCOSE 112*  --   --  112* 107* 80  BUN 30*  --   --  27* 18 18  CREATININE 1.59*  --   --  1.20* 0.91 0.94  CALCIUM  9.6  --   --  9.6 9.4  9.2  MG  --  1.9  --   --  1.7  --   PHOS  --   --   --   --  4.0  --     GFR: Estimated Creatinine Clearance: 50.6 mL/min (by C-G formula based on SCr of 0.94 mg/dL).  Liver Function Tests: Recent Labs  Lab 12/28/23 1359 12/30/23 0441  AST 34 62*  ALT 31 40  ALKPHOS 139* 122  BILITOT 0.7 0.8  PROT 6.5 5.8*  ALBUMIN 3.8 3.4*   No results for input(s): "LIPASE", "AMYLASE" in the last 168 hours. No results for input(s): "AMMONIA" in the last 168 hours.  Coagulation Profile: Recent Labs  Lab 12/28/23 1359  INR 1.2    Cardiac Enzymes: No results for input(s): "CKTOTAL", "CKMB", "CKMBINDEX", "TROPONINI" in the last 168 hours.  BNP (last 3 results) Recent Labs    08/19/23 1145  PROBNP 237.0*    Lipid Profile: No results for input(s): "CHOL", "HDL", "LDLCALC", "TRIG", "CHOLHDL", "LDLDIRECT" in the last 72 hours.  Thyroid  Function Tests: Recent Labs    12/28/23 1746  TSH 5.477*    Anemia Panel: Recent Labs    12/28/23 1920  VITAMINB12 535    Urine analysis:    Component Value Date/Time   COLORURINE YELLOW 12/29/2023 0104   APPEARANCEUR HAZY (A) 12/29/2023 0104   LABSPEC 1.008 12/29/2023 0104   PHURINE 5.0 12/29/2023 0104   GLUCOSEU NEGATIVE 12/29/2023 0104   GLUCOSEU NEGATIVE 08/27/2021 1045   HGBUR NEGATIVE 12/29/2023 0104   HGBUR large 09/07/2010 1338   BILIRUBINUR NEGATIVE 12/29/2023 0104   BILIRUBINUR n 08/24/2019 1035   KETONESUR NEGATIVE 12/29/2023 0104   PROTEINUR 30 (A) 12/29/2023  0104   UROBILINOGEN 0.2 08/27/2021 1045   NITRITE NEGATIVE 12/29/2023 0104   LEUKOCYTESUR MODERATE (A) 12/29/2023 0104    Sepsis Labs: Lactic Acid, Venous    Component Value Date/Time   LATICACIDVEN 1.9 12/28/2023 1425    MICROBIOLOGY: Recent Results (from the past 240 hours)  Resp panel by RT-PCR (RSV, Flu A&B, Covid) Anterior Nasal Swab     Status: None   Collection Time: 12/28/23  1:59 PM   Specimen: Anterior Nasal Swab  Result Value Ref Range Status   SARS Coronavirus 2 by RT PCR NEGATIVE NEGATIVE Final   Influenza A by PCR NEGATIVE NEGATIVE Final   Influenza B by PCR NEGATIVE NEGATIVE Final    Comment: (NOTE) The Xpert Xpress SARS-CoV-2/FLU/RSV plus assay is intended as an aid in the diagnosis of influenza from Nasopharyngeal swab specimens and should not be used as a sole basis for treatment. Nasal washings and aspirates are unacceptable for Xpert Xpress SARS-CoV-2/FLU/RSV testing.  Fact Sheet for Patients: BloggerCourse.com  Fact Sheet for Healthcare Providers: SeriousBroker.it  This test is not yet approved or cleared by the [place]  FDA and has been authorized for detection and/or diagnosis of SARS-CoV-2 by FDA under an Emergency Use Authorization (EUA). This EUA will remain in effect (meaning this test can be used) for the duration of the COVID-19 declaration under Section 564(b)(1) of the Act, 21 U.S.C. section 360bbb-3(b)(1), unless the authorization is terminated or revoked.     Resp Syncytial Virus by PCR NEGATIVE NEGATIVE Final    Comment: (NOTE) Fact Sheet for Patients: BloggerCourse.com  Fact Sheet for Healthcare Providers: SeriousBroker.it  This test is not yet approved or cleared by the [place]  FDA and has been authorized for detection and/or diagnosis of SARS-CoV-2 by FDA under an Emergency Use Authorization (EUA). This EUA  will  remain in effect (meaning this test can be used) for the duration of the COVID-19 declaration under Section 564(b)(1) of the Act, 21 U.S.C. section 360bbb-3(b)(1), unless the authorization is terminated or revoked.  Performed at [facility], [address] [place], [place] [address]   Respiratory (~20 pathogens) panel by PCR     Status: None   Collection Time: 12/28/23  1:59 PM   Specimen: Nasopharyngeal Swab; Respiratory  Result Value Ref Range Status   Adenovirus NOT DETECTED NOT DETECTED Final   Coronavirus 229E NOT DETECTED NOT DETECTED Final    Comment: (NOTE) The Coronavirus on the Respiratory Panel, DOES NOT test for the novel  Coronavirus (2019 nCoV)    Coronavirus HKU1 NOT DETECTED NOT DETECTED Final   Coronavirus NL63 NOT DETECTED NOT DETECTED Final   Coronavirus OC43 NOT DETECTED NOT DETECTED Final   Metapneumovirus NOT DETECTED NOT DETECTED Final   Rhinovirus / Enterovirus NOT DETECTED NOT DETECTED Final   Influenza A NOT DETECTED NOT DETECTED Final   Influenza B NOT DETECTED NOT DETECTED Final   Parainfluenza Virus 1 NOT DETECTED NOT DETECTED Final   Parainfluenza Virus 2 NOT DETECTED NOT DETECTED Final   Parainfluenza Virus 3 NOT DETECTED NOT DETECTED Final   Parainfluenza Virus 4 NOT DETECTED NOT DETECTED Final   Respiratory Syncytial Virus NOT DETECTED NOT DETECTED Final   Bordetella pertussis NOT DETECTED NOT DETECTED Final   Bordetella Parapertussis NOT DETECTED NOT DETECTED Final   Chlamydophila pneumoniae NOT DETECTED NOT DETECTED Final   Mycoplasma pneumoniae NOT DETECTED NOT DETECTED Final    Comment: Performed at [facility], [address], [place], [place] [address]  Blood Culture (routine x 2)     Status: None (Preliminary result)   Collection Time: 12/28/23  2:16 PM   Specimen: BLOOD  Result Value Ref Range Status   Specimen Description BLOOD BLOOD RIGHT FOREARM  Final   Special Requests   Final    BOTTLES DRAWN AEROBIC AND ANAEROBIC  Blood Culture results may not be optimal due to an inadequate volume of blood received in culture bottles   Culture   Final    NO GROWTH 3 DAYS Performed at [facility], [address] [place], [place] [address]    Report Status PENDING  Incomplete  Blood Culture (routine x 2)     Status: None (Preliminary result)   Collection Time: 12/28/23  2:17 PM   Specimen: BLOOD  Result Value Ref Range Status   Specimen Description BLOOD BLOOD LEFT HAND  Final   Special Requests   Final    BOTTLES DRAWN AEROBIC AND ANAEROBIC Blood Culture results may not be optimal due to an inadequate volume of blood received in culture bottles   Culture   Final    NO GROWTH 3 DAYS Performed at [facility], [address], [place], [place] [address]    Report Status PENDING  Incomplete    RADIOLOGY STUDIES/RESULTS: DG Chest Port 1 View Result Date: 12/31/2023 CLINICAL DATA:  78 year old female with history of shortness of breath. Bradycardia. EXAM: PORTABLE CHEST 1 VIEW COMPARISON:  Chest x-ray 12/28/2023. FINDINGS: Lung volumes are normal. Opacity at the left base which may reflect atelectasis and/or consolidation, with superimposed small left pleural effusion. Right lung appears clear. No right pleural effusion. No pneumothorax. No evidence of pulmonary edema. Heart size appears borderline enlarged. Upper mediastinal contours are within normal limits. Atherosclerotic calcifications are noted in the thoracic aorta. IMPRESSION:  1. Persistent atelectasis and/or consolidation in the left lower lobe with small left pleural effusion. 2. Aortic atherosclerosis. Electronically Signed   By: Alexandria Angel M.D.   On: 12/31/2023 06:49     LOS: 3 days   Kimberly Penna, MD  [facility]    To contact the attending provider between 7A-7P or the covering provider during after hours 7P-7A, please log into the web site [email or web address] and access using universal  password for that web site. If  you do not have the password, please call the hospital operator.  12/31/2023, 9:54 AM

## 2023-12-31 NOTE — Progress Notes (Signed)
   12/31/23 0915  Mobility  Activity Ambulated with assistance in room  Level of Assistance Contact guard assist, steadying assist  Assistive Device Other (Comment) (HHA)  Distance Ambulated (ft) 10 ft  Activity Response Tolerated fair  Mobility Referral Yes  Mobility visit 1 Mobility  Mobility Specialist Start Time (ACUTE ONLY) 0850  Mobility Specialist Stop Time (ACUTE ONLY) 0915  Mobility Specialist Time Calculation (min) (ACUTE ONLY) 25 min   Mobility Specialist: Progress Note  Pre-Mobility: HR 60, SpO2 90% 3L Post-Mobility: HR 90,  SpO2 90% 3L  Pt agreeable to mobility session - received in bed. Required CG using HHA. C/o SOB. Returned to chair with all needs met - call bell within reach. RN present during session.   Deborah Bryant, BS Mobility Specialist Please contact via SecureChat or Rehab office at [phone].

## 2023-12-31 NOTE — Plan of Care (Signed)
  Problem: Education: Goal: Ability to describe self-care measures that may prevent or decrease complications (Diabetes Survival Skills Education) will improve Outcome: Progressing Goal: Individualized Educational Video(s) Outcome: Progressing   Problem: Coping: Goal: Ability to adjust to condition or change in health will improve Outcome: Progressing   Problem: Health Behavior/Discharge Planning: Goal: Ability to identify and utilize available resources and services will improve Outcome: Progressing Goal: Ability to manage health-related needs will improve Outcome: Progressing   Problem: Skin Integrity: Goal: Risk for impaired skin integrity will decrease Outcome: Progressing   Problem: Nutritional: Goal: Maintenance of adequate nutrition will improve Outcome: Progressing Goal: Progress toward achieving an optimal weight will improve Outcome: Progressing   Problem: Tissue Perfusion: Goal: Adequacy of tissue perfusion will improve Outcome: Progressing   Problem: Education: Goal: Ability to demonstrate management of disease process will improve Outcome: Progressing Goal: Ability to verbalize understanding of medication therapies will improve Outcome: Progressing Goal: Individualized Educational Video(s) Outcome: Progressing

## 2023-12-31 NOTE — Plan of Care (Signed)
  Problem: Education: Goal: Ability to describe self-care measures that may prevent or decrease complications (Diabetes Survival Skills Education) will improve Outcome: Progressing Goal: Individualized Educational Video(s) Outcome: Progressing   Problem: Coping: Goal: Ability to adjust to condition or change in health will improve Outcome: Progressing   Problem: Fluid Volume: Goal: Ability to maintain a balanced intake and output will improve Outcome: Progressing   Problem: Health Behavior/Discharge Planning: Goal: Ability to identify and utilize available resources and services will improve Outcome: Progressing Goal: Ability to manage health-related needs will improve Outcome: Progressing   Problem: Metabolic: Goal: Ability to maintain appropriate glucose levels will improve Outcome: Progressing   Problem: Nutritional: Goal: Maintenance of adequate nutrition will improve Outcome: Progressing Goal: Progress toward achieving an optimal weight will improve Outcome: Progressing   Problem: Skin Integrity: Goal: Risk for impaired skin integrity will decrease Outcome: Progressing   Problem: Tissue Perfusion: Goal: Adequacy of tissue perfusion will improve Outcome: Progressing   Problem: Education: Goal: Ability to demonstrate management of disease process will improve Outcome: Progressing Goal: Ability to verbalize understanding of medication therapies will improve Outcome: Progressing Goal: Individualized Educational Video(s) Outcome: Progressing   Problem: Activity: Goal: Capacity to carry out activities will improve Outcome: Progressing   Problem: Cardiac: Goal: Ability to achieve and maintain adequate cardiopulmonary perfusion will improve Outcome: Progressing   Problem: Education: Goal: Knowledge of disease or condition will improve Outcome: Progressing Goal: Knowledge of the prescribed therapeutic regimen will improve Outcome: Progressing Goal:  Individualized Educational Video(s) Outcome: Progressing   Problem: Activity: Goal: Ability to tolerate increased activity will improve Outcome: Progressing Goal: Will verbalize the importance of balancing activity with adequate rest periods Outcome: Progressing   Problem: Respiratory: Goal: Ability to maintain a clear airway will improve Outcome: Progressing Goal: Levels of oxygenation will improve Outcome: Progressing Goal: Ability to maintain adequate ventilation will improve Outcome: Progressing   Problem: Activity: Goal: Ability to tolerate increased activity will improve Outcome: Progressing   Problem: Clinical Measurements: Goal: Ability to maintain a body temperature in the normal range will improve Outcome: Progressing   Problem: Respiratory: Goal: Ability to maintain adequate ventilation will improve Outcome: Progressing Goal: Ability to maintain a clear airway will improve Outcome: Progressing   Problem: Education: Goal: Knowledge of General Education information will improve Description: Including pain rating scale, medication(s)/side effects and non-pharmacologic comfort measures Outcome: Progressing   Problem: Health Behavior/Discharge Planning: Goal: Ability to manage health-related needs will improve Outcome: Progressing   Problem: Clinical Measurements: Goal: Ability to maintain clinical measurements within normal limits will improve Outcome: Progressing Goal: Will remain free from infection Outcome: Progressing Goal: Diagnostic test results will improve Outcome: Progressing Goal: Respiratory complications will improve Outcome: Progressing Goal: Cardiovascular complication will be avoided Outcome: Progressing   Problem: Activity: Goal: Risk for activity intolerance will decrease Outcome: Progressing   Problem: Nutrition: Goal: Adequate nutrition will be maintained Outcome: Progressing   Problem: Coping: Goal: Level of anxiety will  decrease Outcome: Progressing   Problem: Elimination: Goal: Will not experience complications related to bowel motility Outcome: Progressing Goal: Will not experience complications related to urinary retention Outcome: Progressing   Problem: Pain Management: Goal: General experience of comfort will improve Outcome: Progressing   Problem: Safety: Goal: Ability to remain free from injury will improve Outcome: Progressing   Problem: Skin Integrity: Goal: Risk for impaired skin integrity will decrease Outcome: Progressing

## 2024-01-01 DIAGNOSIS — J9601 Acute respiratory failure with hypoxia: Secondary | ICD-10-CM | POA: Diagnosis not present

## 2024-01-01 DIAGNOSIS — I5033 Acute on chronic diastolic (congestive) heart failure: Secondary | ICD-10-CM | POA: Diagnosis not present

## 2024-01-01 LAB — BASIC METABOLIC PANEL
Anion gap: 8 (ref 5–15)
BUN: 19 mg/dL (ref 8–23)
CO2: 30 mmol/L (ref 22–32)
Calcium: 8.7 mg/dL — ABNORMAL LOW (ref 8.9–10.3)
Chloride: 94 mmol/L — ABNORMAL LOW (ref 98–111)
Creatinine, Ser: 0.83 mg/dL (ref 0.44–1.00)
GFR, Estimated: >60 mL/min (ref >60)
Glucose, Bld: 81 mg/dL (ref 70–99)
Potassium: 3.6 mmol/L (ref 3.5–5.1)
Sodium: 132 mmol/L — ABNORMAL LOW (ref 135–145)

## 2024-01-01 LAB — GLUCOSE, CAPILLARY
Glucose-Capillary: 137 mg/dL — ABNORMAL HIGH (ref 70–99)
Glucose-Capillary: 102 mg/dL — ABNORMAL HIGH (ref 70–99)
Glucose-Capillary: 93 mg/dL (ref 70–99)
Glucose-Capillary: 138 mg/dL — ABNORMAL HIGH (ref 70–99)

## 2024-01-01 NOTE — Progress Notes (Signed)
   01/01/24 0903  Mobility  Activity Transferred from bed to chair  Level of Assistance Contact guard assist, steadying assist  Assistive Device Front wheel walker  Distance Ambulated (ft) 10 ft  Activity Response Tolerated fair  Mobility Referral Yes  Mobility visit 1 Mobility  Mobility Specialist Start Time (ACUTE ONLY) [phone]  Mobility Specialist Stop Time (ACUTE ONLY) 0903  Mobility Specialist Time Calculation (min) (ACUTE ONLY) 20 min   Mobility Specialist: Progress Note  Pre-Mobility: HR 61, SpO2 92% 1.5L During Mobility:SpO2 84% 1.5L Post-Mobility: HR 66, SpO2 89% 1.5L.  Pt agreeable to mobility session - received in bed. Required CG using RW. C/o weakness and "no recollection of events prior to 3pm yesterday." Returned to chair with all needs met - call bell within reach.  Deborah Bryant, BS Mobility Specialist Please contact via SecureChat or Rehab office at [phone].

## 2024-01-01 NOTE — [place] FL2 (Signed)
[place] MEDICAID FL2 LEVEL OF CARE FORM     IDENTIFICATION  Patient Name: Deborah Bryant Birthdate: 04-11-1946 Sex: female Admission Date (Current Location): 12/28/2023  [facility] and [place] Number:  Producer, television/film/video and Address:  [facility] [place], [address] [place], [place] [address]      Provider Number: [id number]  Attending Physician Name and Address:  Maretta Bees, MD  Relative Name and Phone Number:       Current Level of Care: Hospital Recommended Level of Care: Skilled Nursing Facility Prior Approval Number:    Date Approved/Denied:   PASRR Number: [id number]  Discharge Plan: SNF    Current Diagnoses: Patient Active Problem List   Diagnosis Date Noted   Acute respiratory failure with hypoxia (HCC) 12/28/2023   Hyponatremia 12/28/2023   Chronic pain 12/28/2023   Prolonged QT interval 12/28/2023   Acute on chronic diastolic CHF (congestive heart failure) (HCC) 12/28/2023   Left lower lobe pneumonia 12/28/2023   Acute encephalopathy 12/28/2023   Hypothermia 12/28/2023   Bilateral leg edema 08/19/2023   Chronic pain of both knees 08/19/2023   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   Confusion 11/01/2019   Anosmia 11/01/2019   Ageusia 11/01/2019   Loss of smell 02/02/2019   Loss of taste 02/02/2019   Degenerative spondylolisthesis 09/29/2018   Chronic cough 09/09/2018   Low back pain 06/09/2018   Shoulder pain, bilateral 03/13/2018   Acute renal failure superimposed on stage 2 chronic kidney disease (HCC) 07/03/2017   IBS (irritable bowel syndrome) 07/03/2017   Vitamin D deficiency 08/01/2015   Mixed simple and mucopurulent chronic bronchitis (HCC) 06/11/2015   Chorioretinal scar, 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   Nuclear cataract, bilateral 06/16/2014   Smoker 09/07/2010   ANEMIA 03/29/2008   Gout 10/07/2007   Diabetes mellitus without complication (HCC) 07/21/2007   Hyperlipemia 07/21/2007   Essential hypertension  07/21/2007    Orientation RESPIRATION BLADDER Height & Weight     Self, Time, Situation, Place  O2 (3L nasal cannula) Continent Weight: 184 lb 4.9 oz (83.6 kg) Height:  5\' 2"  (157.5 cm)  BEHAVIORAL SYMPTOMS/MOOD NEUROLOGICAL BOWEL NUTRITION STATUS      Continent Diet (See DC Summary)  AMBULATORY STATUS COMMUNICATION OF NEEDS Skin   Limited Assist Verbally Normal                       Personal Care Assistance Level of Assistance  Bathing, Dressing Bathing Assistance: Limited assistance   Dressing Assistance: Limited assistance     Functional Limitations Info             SPECIAL CARE FACTORS FREQUENCY  PT (By licensed PT), OT (By licensed OT)     PT Frequency: 5x/week OT Frequency: 5x/week            Contractures Contractures Info: Not present    Additional Factors Info  Code Status, Allergies Code Status Info: Full Allergies Info: Benazipril; Lasix; Levaquin; Phenobarbital           Current Medications (01/01/2024):  This is the current hospital active medication list Current Facility-Administered Medications  Medication Dose Route Frequency Provider Last Rate Last Admin   acetaminophen (TYLENOL) tablet 650 mg  650 mg Oral Q6H PRN Orland Mustard, MD       Or   acetaminophen (TYLENOL) suppository 650 mg  650 mg Rectal Q6H PRN Orland Mustard, MD       albuterol (PROVENTIL) (2.5 MG/3ML) 0.083% nebulizer solution 2.5 mg  2.5 mg Nebulization Q2H PRN Orland Mustard, MD   2.5 mg at 12/29/23 1144   allopurinol (ZYLOPRIM) tablet 300 mg  300 mg Oral Daily Orland Mustard, MD   300 mg at 01/01/24 0816   aspirin EC tablet 81 mg  81 mg Oral Daily Orland Mustard, MD   81 mg at 01/01/24 0816   atorvastatin (LIPITOR) tablet 40 mg  40 mg Oral Daily Orland Mustard, MD   40 mg at 01/01/24 1610   benzonatate (TESSALON) capsule 200 mg  200 mg Oral TID PRN Maretta Bees, MD       cefTRIAXone (ROCEPHIN) 1 g in sodium chloride 0.9 % 100 mL IVPB  1 g Intravenous Q24H Orland Mustard, MD 200 mL/hr at 01/01/24 0821 1 g at 01/01/24 9604   diazepam (VALIUM) tablet 2 mg  2 mg Oral Q8H PRN Dorcas Carrow, MD   2 mg at 12/29/23 1104   doxycycline (VIBRA-TABS) tablet 100 mg  100 mg Oral Q12H Orland Mustard, MD   100 mg at 01/01/24 0816   furosemide (LASIX) tablet 80 mg  80 mg Oral Daily Chrystie Nose, MD   80 mg at 01/01/24 0816   heparin injection 5,000 Units  5,000 Units Subcutaneous Q8H Orland Mustard, MD   5,000 Units at 01/01/24 5409   HYDROcodone-acetaminophen (NORCO/VICODIN) 5-325 MG per tablet 1 tablet  1 tablet Oral Q6H PRN Dorcas Carrow, MD   1 tablet at 12/30/23 1742   hydroxychloroquine (PLAQUENIL) tablet 200 mg  200 mg Oral BID Orland Mustard, MD   200 mg at 01/01/24 0816   insulin aspart (novoLOG) injection 0-9 Units  0-9 Units Subcutaneous TID WC Orland Mustard, MD   1 Units at 01/01/24 0827     Discharge Medications: Please see discharge summary for a list of discharge medications.  Relevant Imaging Results:  Relevant Lab Results:   Additional Information SSN: [id number]  Renne Crigler Daelen Belvedere, LCSW

## 2024-01-01 NOTE — Progress Notes (Signed)
PROGRESS NOTE        PATIENT DETAILS Name: Deborah Bryant Age: 78 y.o. Sex: female Date of Birth: 1946-02-20 Admit Date: 12/28/2023 Admitting Physician Orland Mustard, MD PCP:Fry, Tera Mater, MD  Brief Summary: Patient is a 78 y.o.  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  Significant events: 1/12>> admit to [facility]  Significant studies: 1/12>> CXR: Left lower PNA with small pleural effusion 1/12>> CT head: No acute intracranial abnormality 1/13>> echo: EF 60-65%, grade 1 diastolic dysfunction. 1/15>> CXR: Persistent atelectasis/consolidation in the left lower lobe  Significant microbiology data: 1/12>> COVID/influenza/RSV PCR: Negative 1/12>> respiratory virus panel: Negative 1/12>> blood culture: Negative  Procedures: None  Consults: Cardiology  Subjective: Feels much better-briefly titrated off oxygen this morning-O2 saturation decreased to the high 80s-placed back on 2 L.  Objective: Vitals: Blood pressure (!) 155/64, pulse 61, temperature 97.7 F (36.5 C), temperature source Oral, resp. rate 20, height 5\' 2"  (1.575 m), weight 83.6 kg, SpO2 94%.   Exam: Gen Exam:Alert awake-not in any distress HEENT:atraumatic, normocephalic Chest: B/L clear to auscultation anteriorly CVS:S1S2 regular Abdomen:soft non tender, non distended Extremities: Trace edema Neurology: Non focal Skin: no rash  Pertinent Labs/Radiology:    Latest Ref Rng & Units 12/31/2023    5:00 AM 12/30/2023    4:41 AM 12/29/2023    4:56 AM  CBC  WBC 4.0 - 10.5 K/uL 12.3  11.0  9.8   Hemoglobin 12.0 - 15.0 g/dL 16.1  09.6  04.5   Hematocrit 36.0 - 46.0 % 36.4  32.4  35.0   Platelets 150 - 400 K/uL 255  237  215     Lab Results  Component Value Date   NA 132 (L) 01/01/2024   K 3.6  01/01/2024   CL 94 (L) 01/01/2024   CO2 30 01/01/2024      Assessment/Plan: Acute hypoxic respiratory failure Felt to be secondary to PNA/HFpEF exacerbation Gradually improving-stable on just 2 L of oxygen (on initial presentation was on 12 L HFNC)  Continue to treat underlying etiologies  Acute on chronic HFpEF Now euvolemic  Transitioned to oral Lasix Continue to follow weights/intake/output.  LLL PNA Clinically improved-on antibiotics Could have aspirated when she was confused Appreciate SLP eval-tolerating regular diet.  Acute metabolic encephalopathy Secondary to PNA/hypothermia/hypoxemia CT imaging negative Improved-completely awake/alert this morning-sister-in-law at bedside.  Hyponatremia Secondary to volume overload-improving with diuretics  AKI Likely hemodynamically mediated Resolved.  Sinus bradycardia Telemetry monitoring TSH minimally elevated-and unlikely causing bradycardia.  Likely due to hypothermia/Cardizem use (on hold).  HLD Statin  DM-2 (A1c 6.3 on 08/19/2023) CBG stable SSI while inpatient Oral hypoglycemic medications to be resumed on discharge  Recent Labs    12/31/23 1631 12/31/23 2217 01/01/24 0810  GLUCAP 115* 139* 137*    History of RA Plaquenil  Gout No flare Allopurinol  Chronic pain syndrome As needed narcotics  Debility/deconditioning Secondary to acute illness Home versus SNF being contemplated  Class 1 Obesity: Estimated body mass index is 33.71 kg/m as calculated from the following:   Height as of this encounter: 5\' 2"  (1.575 m).   Weight as of this encounter: 83.6 kg.   Code status:   Code Status: Full Code  DVT Prophylaxis: heparin injection 5,000 Units Start: 12/28/23 2200 Place TED hose Start: 12/28/23 1806   Family Communication: Daughter-Jennifer-[phone]-left voicemail on 1/16   Disposition Plan: Status is: Inpatient Remains inpatient appropriate because: Severity of illness   Planned  Discharge Destination:Home health vs SNF   Diet: Diet Order             Diet heart healthy/carb modified Fluid consistency: Thin  Diet effective now                     Antimicrobial agents: Anti-infectives (From admission, onward)    Start     Dose/Rate Route Frequency Ordered Stop   12/29/23 1000  doxycycline (VIBRA-TABS) tablet 100 mg        100 mg Oral Every 12 hours 12/28/23 1823     12/29/23 1000  cefTRIAXone (ROCEPHIN) 1 g in sodium chloride 0.9 % 100 mL IVPB        1 g 200 mL/hr over 30 Minutes Intravenous Every 24 hours 12/28/23 1823 01/03/24 0959   12/29/23 1000  hydroxychloroquine (PLAQUENIL) tablet 200 mg       Note to Pharmacy: Pt reported changed by Rheumatology     200 mg Oral 2 times daily 12/28/23 2029     12/28/23 1430  cefTRIAXone (ROCEPHIN) 1 g in sodium chloride 0.9 % 100 mL IVPB        1 g 200 mL/hr over 30 Minutes Intravenous  Once 12/28/23 1418 12/28/23 1502   12/28/23 1430  azithromycin (ZITHROMAX) 500 mg in sodium chloride 0.9 % 250 mL IVPB        500 mg 250 mL/hr over 60 Minutes Intravenous  Once 12/28/23 1418 12/28/23 1538        MEDICATIONS: Scheduled Meds:  allopurinol  300 mg Oral Daily   aspirin EC  81 mg Oral Daily   atorvastatin  40 mg Oral Daily   doxycycline  100 mg Oral Q12H   furosemide  80 mg Oral Daily   heparin  5,000 Units Subcutaneous Q8H   hydroxychloroquine  200 mg Oral BID   insulin aspart  0-9 Units Subcutaneous TID WC   Continuous Infusions:  cefTRIAXone (ROCEPHIN)  IV 1 g (01/01/24 0821)   PRN Meds:.acetaminophen **OR** acetaminophen, albuterol, benzonatate, diazepam, HYDROcodone-acetaminophen   I have personally reviewed following labs and imaging studies  LABORATORY DATA: CBC: Recent Labs  Lab 12/28/23 1359 12/28/23 1939 12/29/23 0456 12/30/23 0441 12/31/23 0500  WBC 11.1*  --  9.8 11.0* 12.3*  NEUTROABS 9.6*  --   --  9.5*  --   HGB 11.7* 12.2 11.6* 11.1* 11.9*  HCT 35.3* 36.0 35.0* 32.4* 36.4   MCV 86.7  --  86.0 85.7 88.1  PLT 221  --  215 237 255    Basic Metabolic Panel: Recent Labs  Lab 12/28/23 1359 12/28/23 1746 12/28/23 1939 12/29/23 0456 12/30/23 0441 12/31/23 0500 01/01/24 0510  NA 127*  --  125* 128* 134* 135 132*  K 4.3  --  4.6 4.1 3.3* 4.3 3.6  CL 92*  --   --  93* 95* 98 94*  CO2 22  --   --  21* 30 30 30   GLUCOSE 112*  --   --  112* 107* 80 81  BUN 30*  --   --  27* 18 18 19   CREATININE 1.59*  --   --  1.20* 0.91 0.94 0.83  CALCIUM 9.6  --   --  9.6 9.4 9.2 8.7*  MG  --  1.9  --   --  1.7  --   --   PHOS  --   --   --   --  4.0  --   --     GFR: Estimated Creatinine Clearance: 56.9 mL/min (by C-G formula based on SCr of 0.83 mg/dL).  Liver Function Tests: Recent Labs  Lab 12/28/23 1359 12/30/23 0441  AST 34 62*  ALT 31 40  ALKPHOS 139* 122  BILITOT 0.7 0.8  PROT 6.5 5.8*  ALBUMIN 3.8 3.4*   No results for input(s): "LIPASE", "AMYLASE" in the last 168 hours. No results for input(s): "AMMONIA" in the last 168 hours.  Coagulation Profile: Recent Labs  Lab 12/28/23 1359  INR 1.2    Cardiac Enzymes: No results for input(s): "CKTOTAL", "CKMB", "CKMBINDEX", "TROPONINI" in the last 168 hours.  BNP (last 3 results) Recent Labs    08/19/23 1145  PROBNP 237.0*    Lipid Profile: No results for input(s): "CHOL", "HDL", "LDLCALC", "TRIG", "CHOLHDL", "LDLDIRECT" in the last 72 hours.  Thyroid Function Tests: No results for input(s): "TSH", "T4TOTAL", "FREET4", "T3FREE", "THYROIDAB" in the last 72 hours.   Anemia Panel: No results for input(s): "VITAMINB12", "FOLATE", "FERRITIN", "TIBC", "IRON", "RETICCTPCT" in the last 72 hours.   Urine analysis:    Component Value Date/Time   COLORURINE YELLOW 12/29/2023 0104   APPEARANCEUR HAZY (A) 12/29/2023 0104   LABSPEC 1.008 12/29/2023 0104   PHURINE 5.0 12/29/2023 0104   GLUCOSEU NEGATIVE 12/29/2023 0104   GLUCOSEU NEGATIVE 08/27/2021 1045   HGBUR NEGATIVE 12/29/2023 0104   HGBUR  large 09/07/2010 1338   BILIRUBINUR NEGATIVE 12/29/2023 0104   BILIRUBINUR n 08/24/2019 1035   KETONESUR NEGATIVE 12/29/2023 0104   PROTEINUR 30 (A) 12/29/2023 0104   UROBILINOGEN 0.2 08/27/2021 1045   NITRITE NEGATIVE 12/29/2023 0104   LEUKOCYTESUR MODERATE (A) 12/29/2023 0104    Sepsis Labs: Lactic Acid, Venous    Component Value Date/Time   LATICACIDVEN 1.9 12/28/2023 1425    MICROBIOLOGY: Recent Results (from the past 240 hours)  Resp panel by RT-PCR (RSV, Flu A&B, Covid) Anterior Nasal Swab     Status: None   Collection Time: 12/28/23  1:59 PM   Specimen: Anterior Nasal Swab  Result Value Ref Range Status   SARS Coronavirus 2 by RT PCR NEGATIVE NEGATIVE Final   Influenza A by PCR NEGATIVE NEGATIVE Final   Influenza B by PCR NEGATIVE NEGATIVE Final    Comment: (NOTE) The Xpert Xpress SARS-CoV-2/FLU/RSV plus assay is intended as an aid in the diagnosis of influenza from Nasopharyngeal swab specimens and should not be used as a sole basis for treatment. Nasal washings and aspirates are unacceptable for Xpert Xpress SARS-CoV-2/FLU/RSV testing.  Fact Sheet for Patients: BloggerCourse.com  Fact Sheet for Healthcare Providers: SeriousBroker.it  This test is not yet approved or cleared by the [place] FDA and has been authorized for detection and/or diagnosis of SARS-CoV-2 by FDA under an Emergency Use Authorization (EUA). This EUA will remain in effect (meaning this test can be used) for the duration of the COVID-19 declaration under Section 564(b)(1) of the Act, 21 U.S.C. section 360bbb-3(b)(1), unless the authorization is terminated or revoked.     Resp Syncytial Virus by PCR NEGATIVE NEGATIVE Final    Comment: (NOTE) Fact Sheet for Patients: BloggerCourse.com  Fact Sheet for Healthcare Providers: SeriousBroker.it  This test is not yet approved or cleared by  the [place] FDA and has been authorized for detection and/or diagnosis  of SARS-CoV-2 by FDA under an Emergency Use Authorization (EUA). This EUA will remain in effect (meaning this test can be used) for the duration of the COVID-19 declaration under Section 564(b)(1) of the Act, 21 U.S.C. section 360bbb-3(b)(1), unless the authorization is terminated or revoked.  Performed at [facility], [address], [place], [place] [address]   Respiratory (~20 pathogens) panel by PCR     Status: None   Collection Time: 12/28/23  1:59 PM   Specimen: Nasopharyngeal Swab; Respiratory  Result Value Ref Range Status   Adenovirus NOT DETECTED NOT DETECTED Final   Coronavirus 229E NOT DETECTED NOT DETECTED Final    Comment: (NOTE) The Coronavirus on the Respiratory Panel, DOES NOT test for the novel  Coronavirus (2019 nCoV)    Coronavirus HKU1 NOT DETECTED NOT DETECTED Final   Coronavirus NL63 NOT DETECTED NOT DETECTED Final   Coronavirus OC43 NOT DETECTED NOT DETECTED Final   Metapneumovirus NOT DETECTED NOT DETECTED Final   Rhinovirus / Enterovirus NOT DETECTED NOT DETECTED Final   Influenza A NOT DETECTED NOT DETECTED Final   Influenza B NOT DETECTED NOT DETECTED Final   Parainfluenza Virus 1 NOT DETECTED NOT DETECTED Final   Parainfluenza Virus 2 NOT DETECTED NOT DETECTED Final   Parainfluenza Virus 3 NOT DETECTED NOT DETECTED Final   Parainfluenza Virus 4 NOT DETECTED NOT DETECTED Final   Respiratory Syncytial Virus NOT DETECTED NOT DETECTED Final   Bordetella pertussis NOT DETECTED NOT DETECTED Final   Bordetella Parapertussis NOT DETECTED NOT DETECTED Final   Chlamydophila pneumoniae NOT DETECTED NOT DETECTED Final   Mycoplasma pneumoniae NOT DETECTED NOT DETECTED Final    Comment: Performed at [facility], [address] [place], [place] [address]  Blood Culture (routine x 2)     Status: None (Preliminary result)   Collection Time: 12/28/23  2:16 PM   Specimen:  BLOOD  Result Value Ref Range Status   Specimen Description BLOOD BLOOD RIGHT FOREARM  Final   Special Requests   Final    BOTTLES DRAWN AEROBIC AND ANAEROBIC Blood Culture results may not be optimal due to an inadequate volume of blood received in culture bottles   Culture   Final    NO GROWTH 4 DAYS Performed at [facility], [address], [place], [place] [address]    Report Status PENDING  Incomplete  Blood Culture (routine x 2)     Status: None (Preliminary result)   Collection Time: 12/28/23  2:17 PM   Specimen: BLOOD  Result Value Ref Range Status   Specimen Description BLOOD BLOOD LEFT HAND  Final   Special Requests   Final    BOTTLES DRAWN AEROBIC AND ANAEROBIC Blood Culture results may not be optimal due to an inadequate volume of blood received in culture bottles   Culture   Final    NO GROWTH 4 DAYS Performed at [facility], [address], [place], [place] [address]    Report Status PENDING  Incomplete    RADIOLOGY STUDIES/RESULTS: DG Chest Port 1 View Result Date: 12/31/2023 CLINICAL DATA:  78 year old female with history of shortness of breath. Bradycardia. EXAM: PORTABLE CHEST 1 VIEW COMPARISON:  Chest x-ray 12/28/2023. FINDINGS: Lung volumes are normal. Opacity at the left base which may reflect atelectasis and/or consolidation, with superimposed small left pleural effusion. Right lung appears clear. No right pleural effusion. No pneumothorax. No evidence of pulmonary edema. Heart size appears borderline enlarged. Upper mediastinal contours are within  normal limits. Atherosclerotic calcifications are noted in the thoracic aorta. IMPRESSION: 1. Persistent atelectasis and/or consolidation in the left lower lobe with small left pleural effusion. 2. Aortic atherosclerosis. Electronically Signed   By: Trudie Reed M.D.   On: 12/31/2023 06:49     LOS: 4 days   Jeoffrey Massed, MD  [facility]    To contact the attending provider between 7A-7P  or the covering provider during after hours 7P-7A, please log into the web site [email or web address] and access using universal [place] password for that web site. If you do not have the password, please call the hospital operator.  01/01/2024, 11:38 AM

## 2024-01-01 NOTE — Progress Notes (Signed)
   01/01/24 1434  Mobility  Activity Ambulated with assistance in hallway  Level of Assistance Contact guard assist, steadying assist  Assistive Device Four wheel walker  Distance Ambulated (ft) 275 ft  Activity Response Tolerated fair  Mobility Referral Yes  Mobility visit 1 Mobility  Mobility Specialist Start Time (ACUTE ONLY) 1357  Mobility Specialist Stop Time (ACUTE ONLY) 1427  Mobility Specialist Time Calculation (min) (ACUTE ONLY) 30 min   Mobility Specialist: Progress Note  Pre-Mobility: HR 61, SpO2 95% 3L During Mobility: SpO2 84-94% 4L Post-Mobility: HR 67, SpO2 89% 3L  Pt agreeable to mobility session - received in bed. Required CG using Rollator, one standing break. Pt with SOB and slight dizziness at BOS. Returned to bed with all needs met - call bell within reach. Sister present.   Barnie Mort, BS Mobility Specialist Please contact via SecureChat or Rehab office at [phone].

## 2024-01-01 NOTE — Progress Notes (Signed)
Occupational Therapy Treatment Patient Details Name: Deborah Bryant MRN: [id number] DOB: Oct 09, 1946 Today's Date: 01/01/2024   History of present illness Pt is 78 yo female who presents on 12/28/23 with hypoxia, bradycardia, and AMS. PMH: pneumonia, MRSA 2006, HTN, HLD, GERD, DM, dyspnea, COPD, CKD, L periorbital cellulitis, L4-5 fusion, chronic pain   OT comments  This 78 yo female seen today for energy conservation education and she is doing better from an O2 supplemental standpoint (on 4 liters standard [place] today as opposed to 5 liters HFNC 2 days ago).  Her balance is also getting better. She will continue to benefit from acute OT with follow up OT  from continued inpatient follow up therapy, <3 hours/day to get to a point she will be off of O2 and hopefully off of RW/rollator before discharging home as the primary caregiver for her husband.       If plan is discharge home, recommend the following:  A little help with walking and/or transfers;A little help with bathing/dressing/bathroom;Assistance with cooking/housework;Assist for transportation   Equipment Recommendations  None recommended by OT       Precautions / Restrictions Precautions Precaution Comments: watch O2 sats (stayed 90-92% on 4 liters when up and about in room; on 2 liters she dropped as low as 87% just with talking) Restrictions Weight Bearing Restrictions Per Provider Order: No       Mobility Bed Mobility               General bed mobility comments: pt up in recliner upon arrival    Transfers Overall transfer level: Needs assistance Equipment used: None Transfers: Sit to/from Stand Sit to Stand: Supervision           General transfer comment: CGA to ambulate around in room while also managing O2 tubing (4 liters)     Balance Overall balance assessment: Mild deficits observed, not formally tested (in standing)                                         ADL either performed or  assessed with clinical judgement   ADL Overall ADL's : Needs assistance/impaired     Grooming: Contact guard assist;Standing Grooming Details (indicate cue type and reason): standing at sink to brush teeth             Lower Body Dressing: Contact guard assist;Sit to/from stand Lower Body Dressing Details (indicate cue type and reason): pt can cross legs to get go feet while seated Toilet Transfer: Contact guard assist;Ambulation Toilet Transfer Details (indicate cue type and reason): simulated recliner>around bed to sink>back around bed to recliner           General ADL Comments: CGA while managing her O2 tubing in room (tight spaces, no AD at same time); educated on energy conservation techniques/strategies from handout (5 P's of energy conservation) which includes purse lipped breathing; as well as inspirometer use (10 times very hour awake or 5 times every 30 minutes awake--pt currently able to get to 750 consistently with 5 breaths)    Extremity/Trunk Assessment Upper Extremity Assessment Upper Extremity Assessment: Overall WFL for tasks assessed;Right hand dominant (RA both hands)            Vision Baseline Vision/History: 1 Wears glasses Ability to See in Adequate Light: 0 Adequate Patient Visual Report: No change from baseline  Cognition Arousal: Alert Behavior During Therapy: WFL for tasks assessed/performed Overall Cognitive Status: Within Functional Limits for tasks assessed                                                     Pertinent Vitals/ Pain       Pain Assessment Pain Assessment: No/denies pain         Frequency  Min 1X/week        Progress Toward Goals  OT Goals(current goals can now be found in the care plan section)  Progress towards OT goals: Progressing toward goals  Acute Rehab OT Goals Patient Stated Goal: to not be on O2 when I go home so I and my husband won't trip over it, would also like to  be off RW/rollator as well before going home OT Goal Formulation: With patient/family Time For Goal Achievement: 01/13/24 Potential to Achieve Goals: Good         AM-PAC OT "6 Clicks" Daily Activity     Outcome Measure   Help from another person eating meals?: None Help from another person taking care of personal grooming?: A Little Help from another person toileting, which includes using toliet, bedpan, or urinal?: A Little Help from another person bathing (including washing, rinsing, drying)?: A Little Help from another person to put on and taking off regular upper body clothing?: A Little Help from another person to put on and taking off regular lower body clothing?: A Little 6 Click Score: 19    End of Session Equipment Utilized During Treatment: Oxygen (4 liters)  OT Visit Diagnosis: Unsteadiness on feet (R26.81)   Activity Tolerance Patient tolerated treatment well   Patient Left in chair;with call bell/phone within reach;with family/visitor present (no alarm in chair (where pt was sitting) upon my entry)   Nurse Communication Mobility status (change from [facility] to SNF)        Time: 1308-6578 OT Time Calculation (min): 36 min  Charges: OT General Charges $OT Visit: 1 Visit OT Treatments $Self Care/Home Management : 23-37 mins  Lindon Romp OT Acute Rehabilitation Services Office [phone]    Evette Georges 01/01/2024, 11:34 AM

## 2024-01-01 NOTE — TOC Initial Note (Signed)
Transition of Care [facility]) - Initial/Assessment Note    Patient Details  Name: Deborah Bryant MRN: [id number] Date of Birth: Jul 27, 1946  Transition of Care [facility]) CM/SW Contact:    Lamonte Sakai, Student-Social Work Phone Number: 01/01/2024, 3:51 PM  Clinical Narrative:                 Pt admitted from home with spouse. Received TOC consult from OT requesting SNF placement. MSW Intern met with pt and family at bedside. Pt is in agreement with going to SNF. MSW Intern provided pt with placement options and medicare ratings list.   Expected Discharge Plan: Skilled Nursing Facility     Patient Goals and CMS Choice     Choice offered to / list presented to : Patient      Expected Discharge Plan and Services In-house Referral: Clinical Social Work     Living arrangements for the past 2 months: Single Family Home                                      Prior Living Arrangements/Services Living arrangements for the past 2 months: Single Family Home Lives with:: Spouse                   Activities of Daily Living   ADL Screening (condition at time of admission) Independently performs ADLs?: Yes (appropriate for developmental age) Is the patient deaf or have difficulty hearing?: No Does the patient have difficulty seeing, even when wearing glasses/contacts?: No Does the patient have difficulty concentrating, remembering, or making decisions?: Yes  Permission Sought/Granted                  Emotional Assessment Appearance:: Appears stated age Attitude/Demeanor/Rapport: Engaged Affect (typically observed): Calm, Pleasant Orientation: : Oriented to Self, Oriented to Place, Oriented to  Time, Oriented to Situation      Admission diagnosis:  COPD exacerbation (HCC) [J44.1] Acute respiratory failure with hypoxia (HCC) [J96.01] AKI (acute kidney injury) (HCC) [N17.9] Acute on chronic respiratory failure with hypoxia (HCC) [J96.21] Hypervolemia, unspecified  hypervolemia type [E87.70] Pneumonia of left lower lobe due to infectious organism [J18.9] Patient Active Problem List   Diagnosis Date Noted   Acute respiratory failure with hypoxia (HCC) 12/28/2023   Hyponatremia 12/28/2023   Chronic pain 12/28/2023   Prolonged QT interval 12/28/2023   Acute on chronic diastolic CHF (congestive heart failure) (HCC) 12/28/2023   Left lower lobe pneumonia 12/28/2023   Acute encephalopathy 12/28/2023   Hypothermia 12/28/2023   Bilateral leg edema 08/19/2023   Chronic pain of both knees 08/19/2023   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   Confusion 11/01/2019   Anosmia 11/01/2019   Ageusia 11/01/2019   Loss of smell 02/02/2019   Loss of taste 02/02/2019   Degenerative spondylolisthesis 09/29/2018   Chronic cough 09/09/2018   Low back pain 06/09/2018   Shoulder pain, bilateral 03/13/2018   Acute renal failure superimposed on stage 2 chronic kidney disease (HCC) 07/03/2017   IBS (irritable bowel syndrome) 07/03/2017   Vitamin D deficiency 08/01/2015   Mixed simple and mucopurulent chronic bronchitis (HCC) 06/11/2015   Chorioretinal scar, 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   Nuclear cataract, bilateral 06/16/2014   Smoker 09/07/2010   ANEMIA 03/29/2008   Gout 10/07/2007   Diabetes mellitus without complication (HCC) 07/21/2007   Hyperlipemia 07/21/2007   Essential hypertension 07/21/2007   PCP:  Nelwyn Salisbury, MD Pharmacy:  [facility] [place], [place] - [address] [place] [address] Phone: [phone] Fax: [phone]  [facility] [place] [facility] [address] [place] [address] Phone: [phone] Fax: [phone]     Social Drivers of Health (SDOH) Social History: SDOH Screenings   Food Insecurity: No Food Insecurity (12/28/2023)  Housing: Low Risk  (12/28/2023)  Transportation Needs: No Transportation Needs (12/28/2023)  Utilities: Not At Risk  (12/28/2023)  Depression (PHQ2-9): Medium Risk (12/03/2023)  Social Connections: Moderately Isolated (12/28/2023)  Tobacco Use: High Risk (12/28/2023)   SDOH Interventions:     Readmission Risk Interventions     No data to display

## 2024-01-01 NOTE — Care Management Important Message (Signed)
Important Message  Patient Details  Name: Deborah Bryant MRN: [id number] Date of Birth: November 10, 1946   Important Message Given:  Yes - Medicare IM     Dorena Bodo 01/01/2024, 1:51 PM

## 2024-01-01 NOTE — Progress Notes (Signed)
Heart Failure Navigator Progress Note  Assessed for Heart & Vascular TOC clinic readiness.  Patient does not meet criteria due to EF 60-65%, per Dr. Rennis Golden will arrange a [facility] follow up in 2 weeks. No HF TOC. .   Navigator will sign off at this time.   Rhae Hammock, BSN, Scientist, clinical (histocompatibility and immunogenetics) Only

## 2024-01-01 NOTE — Progress Notes (Signed)
DAILY PROGRESS NOTE   Patient Name: Deborah Bryant Date of Encounter: 01/01/2024 Cardiologist: None  Chief Complaint   No complaints  Patient Profile   Deborah Bryant is a 78 y.o. female with a hx of HTN, DM2, and chronic pain on opioids who is being seen 12/28/2023 for the evaluation of hypoxia, bradycardia, and altered mental status   Subjective   Excellent diuresis yesterday- she was 2.7L negative- now 3.6L overall negative- transitioned to oral lasix today.  Creatinine further improved to 0.83.  Potassium 3.6.  Objective   Vitals:   12/31/23 2344 01/01/24 0306 01/01/24 0500 01/01/24 0808  BP: (!) 155/118 (!) 171/65  (!) 155/64  Pulse: 60 (!) 59  61  Resp: 18 17  20   Temp: (!) 97.5 F (36.4 C) 97.7 F (36.5 C)  97.7 F (36.5 C)  TempSrc: Oral Oral  Oral  SpO2: 96% 95%  94%  Weight:   83.6 kg   Height:        Intake/Output Summary (Last 24 hours) at 01/01/2024 0854 Last data filed at 01/01/2024 0700 Gross per 24 hour  Intake --  Output 2750 ml  Net -2750 ml   Filed Weights   12/28/23 1314 01/01/24 0500  Weight: 84.8 kg 83.6 kg    Physical Exam   General appearance: alert, no distress, and moderately obese Neck: no carotid bruit, no JVD, and thyroid not enlarged, symmetric, no tenderness/mass/nodules Lungs: clear to auscultation bilaterally Heart: regular rate and rhythm, S1, S2 normal, no murmur, click, rub or gallop Abdomen: soft, non-tender; bowel sounds normal; no masses,  no organomegaly Extremities: extremities normal, atraumatic, no cyanosis or edema Pulses: 2+ and symmetric Skin: Skin color, texture, turgor normal. No rashes or lesions Neurologic: Grossly normal Psych: Pleasant  Inpatient Medications    Scheduled Meds:  allopurinol  300 mg Oral Daily   aspirin EC  81 mg Oral Daily   atorvastatin  40 mg Oral Daily   doxycycline  100 mg Oral Q12H   furosemide  80 mg Oral Daily   heparin  5,000 Units Subcutaneous Q8H   hydroxychloroquine   200 mg Oral BID   insulin aspart  0-9 Units Subcutaneous TID WC    Continuous Infusions:  cefTRIAXone (ROCEPHIN)  IV 1 g (01/01/24 0821)    PRN Meds: acetaminophen **OR** acetaminophen, albuterol, benzonatate, diazepam, HYDROcodone-acetaminophen   Labs   Results for orders placed or performed during the hospital encounter of 12/28/23 (from the past 48 hours)  Glucose, capillary     Status: Abnormal   Collection Time: 12/30/23 12:06 PM  Result Value Ref Range   Glucose-Capillary 51 (L) 70 - 99 mg/dL    Comment: Glucose reference range applies only to samples taken after fasting for at least 8 hours.  Glucose, capillary     Status: Abnormal   Collection Time: 12/30/23  4:29 PM  Result Value Ref Range   Glucose-Capillary 108 (H) 70 - 99 mg/dL    Comment: Glucose reference range applies only to samples taken after fasting for at least 8 hours.  Glucose, capillary     Status: None   Collection Time: 12/30/23 10:14 PM  Result Value Ref Range   Glucose-Capillary 94 70 - 99 mg/dL    Comment: Glucose reference range applies only to samples taken after fasting for at least 8 hours.  CBC     Status: Abnormal   Collection Time: 12/31/23  5:00 AM  Result Value Ref Range   WBC 12.3 (  H) 4.0 - 10.5 K/uL   RBC 4.13 3.87 - 5.11 MIL/uL   Hemoglobin 11.9 (L) 12.0 - 15.0 g/dL   HCT 16.1 09.6 - 04.5 %   MCV 88.1 80.0 - 100.0 fL   MCH 28.8 26.0 - 34.0 pg   MCHC 32.7 30.0 - 36.0 g/dL   RDW 40.9 (H) 81.1 - 91.4 %   Platelets 255 150 - 400 K/uL   nRBC 0.0 0.0 - 0.2 %    Comment: Performed at [facility], [address] [place], [place] [address]  Basic metabolic panel     Status: None   Collection Time: 12/31/23  5:00 AM  Result Value Ref Range   Sodium 135 135 - 145 mmol/L   Potassium 4.3 3.5 - 5.1 mmol/L   Chloride 98 98 - 111 mmol/L   CO2 30 22 - 32 mmol/L   Glucose, Bld 80 70 - 99 mg/dL    Comment: Glucose reference range applies only to samples taken after fasting for at least 8  hours.   BUN 18 8 - 23 mg/dL   Creatinine, Ser 6.21 0.44 - 1.00 mg/dL   Calcium 9.2 8.9 - 30.8 mg/dL   GFR, Estimated >65 >78 mL/min    Comment: (NOTE) Calculated using the CKD-EPI Creatinine Equation (2021)    Anion gap 7 5 - 15    Comment: Performed at [facility], [address], [place], [place] [address]  Glucose, capillary     Status: None   Collection Time: 12/31/23  7:49 AM  Result Value Ref Range   Glucose-Capillary 81 70 - 99 mg/dL    Comment: Glucose reference range applies only to samples taken after fasting for at least 8 hours.  Glucose, capillary     Status: Abnormal   Collection Time: 12/31/23  4:31 PM  Result Value Ref Range   Glucose-Capillary 115 (H) 70 - 99 mg/dL    Comment: Glucose reference range applies only to samples taken after fasting for at least 8 hours.  Glucose, capillary     Status: Abnormal   Collection Time: 12/31/23 10:17 PM  Result Value Ref Range   Glucose-Capillary 139 (H) 70 - 99 mg/dL    Comment: Glucose reference range applies only to samples taken after fasting for at least 8 hours.  Basic metabolic panel     Status: Abnormal   Collection Time: 01/01/24  5:10 AM  Result Value Ref Range   Sodium 132 (L) 135 - 145 mmol/L   Potassium 3.6 3.5 - 5.1 mmol/L   Chloride 94 (L) 98 - 111 mmol/L   CO2 30 22 - 32 mmol/L   Glucose, Bld 81 70 - 99 mg/dL    Comment: Glucose reference range applies only to samples taken after fasting for at least 8 hours.   BUN 19 8 - 23 mg/dL   Creatinine, Ser 9.52 0.44 - 1.00 mg/dL   Calcium 8.7 (L) 8.9 - 10.3 mg/dL   GFR, Estimated >84 >13 mL/min    Comment: (NOTE) Calculated using the CKD-EPI Creatinine Equation (2021)    Anion gap 8 5 - 15    Comment: Performed at [facility], [address] [place], [place] [address]  Glucose, capillary     Status: Abnormal   Collection Time: 01/01/24  8:10 AM  Result Value Ref Range   Glucose-Capillary 137 (H) 70 - 99 mg/dL    Comment: Glucose reference  range applies only to samples taken after fasting  for at least 8 hours.    ECG   N/A  Telemetry   Sinus rhythm - Personally Reviewed  Radiology    DG Chest Port 1 View Result Date: 12/31/2023 CLINICAL DATA:  78 year old female with history of shortness of breath. Bradycardia. EXAM: PORTABLE CHEST 1 VIEW COMPARISON:  Chest x-ray 12/28/2023. FINDINGS: Lung volumes are normal. Opacity at the left base which may reflect atelectasis and/or consolidation, with superimposed small left pleural effusion. Right lung appears clear. No right pleural effusion. No pneumothorax. No evidence of pulmonary edema. Heart size appears borderline enlarged. Upper mediastinal contours are within normal limits. Atherosclerotic calcifications are noted in the thoracic aorta. IMPRESSION: 1. Persistent atelectasis and/or consolidation in the left lower lobe with small left pleural effusion. 2. Aortic atherosclerosis. Electronically Signed   By: Trudie Reed M.D.   On: 12/31/2023 06:49    Cardiac Studies   Echo reviewed  Assessment   Principal Problem:   Acute respiratory failure with hypoxia (HCC) Active Problems:   Diabetes mellitus without complication (HCC)   Hyperlipemia   Gout   Essential hypertension   Acute renal failure superimposed on stage 2 chronic kidney disease (HCC)   Seronegative inflammatory arthritis   Hyponatremia   Chronic pain   Prolonged QT interval   Acute on chronic diastolic CHF (congestive heart failure) (HCC)   Left lower lobe pneumonia   Acute encephalopathy   Hypothermia   Plan   Excellent diuresis yesterday- weight is down. Creatinine stable - appears euvolemic on exam. Have switched to oral lasix 80 mg daily. She was previously on 40 mg (20 mg BID) at home. Should have repeat BMET next week and follow-up to evaluate dosing and effect. No further suggestions at this time.  [facility] will sign off.   Medication Recommendations:  as above Other  recommendations (labs, testing, etc):  none Follow up as an outpatient:  Luka Reisch or APP in 2 weeks   Time Spent Directly with Patient:  I have spent a total of 25 minutes with the patient reviewing hospital notes, telemetry, EKGs, labs and examining the patient as well as establishing an assessment and plan that was discussed personally with the patient.  > 50% of time was spent in direct patient care.  Length of Stay:  LOS: 4 days   Chrystie Nose, MD, [facility], FACP  [place]  [facility]  Medical Director of the [facility]  Cardiovascular Risk Reduction Clinic Diplomate of the American Board of Clinical Lipidology Attending Cardiologist  Direct Dial: [phone]  Fax: [phone]  Website:  [email or web address] Nicely Prakriti Carignan 01/01/2024, 8:54 AM

## 2024-01-02 DIAGNOSIS — J9601 Acute respiratory failure with hypoxia: Secondary | ICD-10-CM | POA: Diagnosis not present

## 2024-01-02 DIAGNOSIS — I5033 Acute on chronic diastolic (congestive) heart failure: Secondary | ICD-10-CM | POA: Diagnosis not present

## 2024-01-02 LAB — GLUCOSE, CAPILLARY
Glucose-Capillary: 134 mg/dL — ABNORMAL HIGH (ref 70–99)
Glucose-Capillary: 144 mg/dL — ABNORMAL HIGH (ref 70–99)
Glucose-Capillary: 126 mg/dL — ABNORMAL HIGH (ref 70–99)

## 2024-01-02 LAB — CULTURE, BLOOD (ROUTINE X 2)
Culture: NO GROWTH
Culture: NO GROWTH

## 2024-01-02 LAB — BASIC METABOLIC PANEL
Anion gap: 11 (ref 5–15)
BUN: 18 mg/dL (ref 8–23)
CO2: 30 mmol/L (ref 22–32)
Calcium: 8.7 mg/dL — ABNORMAL LOW (ref 8.9–10.3)
Chloride: 93 mmol/L — ABNORMAL LOW (ref 98–111)
Creatinine, Ser: 0.85 mg/dL (ref 0.44–1.00)
GFR, Estimated: >60 mL/min (ref >60)
Glucose, Bld: 82 mg/dL (ref 70–99)
Potassium: 3.3 mmol/L — ABNORMAL LOW (ref 3.5–5.1)
Sodium: 134 mmol/L — ABNORMAL LOW (ref 135–145)

## 2024-01-02 MED ORDER — POTASSIUM CHLORIDE CRYS ER 20 MEQ PO TBCR
40.0000 meq | EXTENDED_RELEASE_TABLET | Freq: Once | ORAL | Status: AC
  Administered 2024-01-02: 40 meq via ORAL
  Filled 2024-01-02: qty 2

## 2024-01-02 MED ORDER — POTASSIUM CHLORIDE CRYS ER 20 MEQ PO TBCR
40.0000 meq | EXTENDED_RELEASE_TABLET | Freq: Every day | ORAL | Status: DC
  Administered 2024-01-03: 40 meq via ORAL
  Filled 2024-01-02: qty 2

## 2024-01-02 MED ORDER — AMOXICILLIN-POT CLAVULANATE 875-125 MG PO TABS
1.0000 | ORAL_TABLET | Freq: Two times a day (BID) | ORAL | Status: DC
  Administered 2024-01-02 – 2024-01-03 (×3): 1 via ORAL
  Filled 2024-01-02 (×4): qty 1

## 2024-01-02 NOTE — TOC Progression Note (Signed)
Transition of Care [facility]) - Progression Note    Patient Details  Name: Deborah Bryant MRN: [id number] Date of Birth: 01/31/1946  Transition of Care [facility]) CM/SW Contact  Preesha Benjamin Reeves Forth, Student-Social Work Phone Number: 01/02/2024, 3:22 PM  Clinical Narrative:    MSW Intern spoke with pt who expressed wanting to go to [facility] following discharge. Pt confirmed with MSW Intern that is where she would like to go upon discharge tomorrow.   Expected Discharge Plan: Skilled Nursing Facility    Expected Discharge Plan and Services In-house Referral: Clinical Social Work     Living arrangements for the past 2 months: Single Family Home                                       Social Determinants of Health (SDOH) Interventions SDOH Screenings   Food Insecurity: No Food Insecurity (12/28/2023)  Housing: Low Risk  (12/28/2023)  Transportation Needs: No Transportation Needs (12/28/2023)  Utilities: Not At Risk (12/28/2023)  Depression (PHQ2-9): Medium Risk (12/03/2023)  Social Connections: Moderately Isolated (12/28/2023)  Tobacco Use: High Risk (12/28/2023)    Readmission Risk Interventions     No data to display

## 2024-01-02 NOTE — Plan of Care (Signed)
Pt has rested quietly throughout the night with no distress noted. Alert and oriented. On O2 3LNC. SB/SR on the monitor. Up to [facility] to void. Had once BM. Medicated for pain once with relief noted. No other complaints voiced.     Problem: Education: Goal: Knowledge of General Education information will improve Description: Including pain rating scale, medication(s)/side effects and non-pharmacologic comfort measures Outcome: Progressing   Problem: Health Behavior/Discharge Planning: Goal: Ability to manage health-related needs will improve Outcome: Progressing   Problem: Clinical Measurements: Goal: Respiratory complications will improve Outcome: Progressing   Problem: Activity: Goal: Risk for activity intolerance will decrease Outcome: Progressing   Problem: Nutrition: Goal: Adequate nutrition will be maintained Outcome: Progressing   Problem: Pain Management: Goal: General experience of comfort will improve Outcome: Progressing

## 2024-01-02 NOTE — Progress Notes (Addendum)
   01/02/24 0918  Mobility  Activity Ambulated with assistance in hallway  Level of Assistance Contact guard assist, steadying assist  Assistive Device Four wheel walker  Distance Ambulated (ft) 275 ft  Activity Response Tolerated fair  Mobility Referral Yes  Mobility visit 1 Mobility  Mobility Specialist Start Time (ACUTE ONLY) 0850  Mobility Specialist Stop Time (ACUTE ONLY) 0918  Mobility Specialist Time Calculation (min) (ACUTE ONLY) 28 min   Mobility Specialist: Progress Note  Pre-Mobility:      HR 60s, SpO2 97% 3L During Mobility: HR 70s, SpO2 80- 92% 3L Post-Mobility:    HR 60s, SpO2 93% 3L  Pt agreeable to mobility session - received in bed. Required CG using RW, x5 standing rest break for SPO2 recovery. C/o BLE weakness.  Returned to chair with all needs met - call bell within reach.   Barnie Mort, BS Mobility Specialist Please contact via SecureChat or Rehab office at [phone].

## 2024-01-02 NOTE — Progress Notes (Addendum)
PROGRESS NOTE        PATIENT DETAILS Name: Deborah Bryant Age: 78 y.o. Sex: female Date of Birth: 06-26-1946 Admit Date: 12/28/2023 Admitting Physician Orland Mustard, MD PCP:Fry, Tera Mater, MD  Brief Summary: Patient is a 78 y.o.  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  Significant events: 1/12>> admit to [facility]  Significant studies: 1/12>> CXR: Left lower PNA with small pleural effusion 1/12>> CT head: No acute intracranial abnormality 1/13>> echo: EF 60-65%, grade 1 diastolic dysfunction. 1/15>> CXR: Persistent atelectasis/consolidation in the left lower lobe  Significant microbiology data: 1/12>> COVID/influenza/RSV PCR: Negative 1/12>> respiratory virus panel: Negative 1/12>> blood culture: Negative  Procedures: None  Consults: Cardiology  Subjective: No major issues overnight-stable on just 2 L of oxygen.  Objective: Vitals: Blood pressure 139/72, pulse 73, temperature 97.6 F (36.4 C), temperature source Oral, resp. rate 17, height 5\' 2"  (1.575 m), weight 82.7 kg, SpO2 91%.   Exam: Gen Exam:Alert awake-not in any distress HEENT:atraumatic, normocephalic Chest: B/L clear to auscultation anteriorly CVS:S1S2 regular Abdomen:soft non tender, non distended Extremities:no edema Neurology: Non focal Skin: no rash  Pertinent Labs/Radiology:    Latest Ref Rng & Units 12/31/2023    5:00 AM 12/30/2023    4:41 AM 12/29/2023    4:56 AM  CBC  WBC 4.0 - 10.5 K/uL 12.3  11.0  9.8   Hemoglobin 12.0 - 15.0 g/dL 16.1  09.6  04.5   Hematocrit 36.0 - 46.0 % 36.4  32.4  35.0   Platelets 150 - 400 K/uL 255  237  215     Lab Results  Component Value Date   NA 134 (L) 01/02/2024   K 3.3 (L) 01/02/2024   CL 93 (L) 01/02/2024   CO2 30 01/02/2024       Assessment/Plan: Acute hypoxic respiratory failure Felt to be secondary to PNA/HFpEF exacerbation Down to just 2 L of oxygen-was on 12 L on initial presentation. Continue to attempt to slowly titrate down FiO2.  Acute on chronic HFpEF Significant improvement-now euvolemic  Initially on IV Lasix-has been transitioned to oral Lasix.  LLL PNA Clinically improved-will switch to Augmentin for a few more days. Could have aspirated when she was confused Appreciate SLP eval-tolerating regular diet.  Acute metabolic encephalopathy Secondary to PNA/hypothermia/hypoxemia CT imaging negative Encephalopathy has resolved-she is completely awake and alert.  Hyponatremia Secondary to volume overload-improving with diuretics  Hypokalemia Replete/recheck.  AKI Likely hemodynamically mediated Resolved.  Sinus bradycardia Telemetry monitoring TSH minimally elevated-and unlikely causing bradycardia.  Likely due to hypothermia/Cardizem use (on hold).  HLD Statin  DM-2 (A1c 6.3 on 08/19/2023) CBG stable SSI while inpatient Oral hypoglycemic medications to be resumed on discharge  Recent Labs    01/01/24 1654 01/01/24 2114 01/02/24 0759  GLUCAP 93 138* 134*    History of RA Plaquenil  Gout No flare Allopurinol  Chronic pain syndrome As needed narcotics  Vaginal bleeding Occurred on 1/17-upon further evaluation-it appears that this is not a new issue-has been ongoing intermittently for the past several months. Pelvic exam performed with Louretta Parma, RN as chaperone-no active bleeding noted-no blood noted around the rectum. Discussed with patient/multiple family members at bedside-Will need outpatient GYN evaluation  Debility/deconditioning Secondary  to acute illness SNF planned.  Class 1 Obesity: Estimated body mass index is 33.35 kg/m as calculated from the following:   Height as of this encounter: 5\' 2"  (1.575 m).   Weight as of this encounter: 82.7 kg.    Code status:   Code Status: Full Code   DVT Prophylaxis: heparin injection 5,000 Units Start: 12/28/23 2200 Place TED hose Start: 12/28/23 1806   Family Communication: Daughter-Jennifer-[phone]-left voicemail on 1/16   Disposition Plan: Status is: Inpatient Remains inpatient appropriate because: Severity of illness   Planned Discharge Destination:Home health vs SNF   Diet: Diet Order             Diet heart healthy/carb modified Fluid consistency: Thin  Diet effective now                     Antimicrobial agents: Anti-infectives (From admission, onward)    Start     Dose/Rate Route Frequency Ordered Stop   01/02/24 1000  amoxicillin-clavulanate (AUGMENTIN) 875-125 MG per tablet 1 tablet        1 tablet Oral Every 12 hours 01/02/24 0656     12/29/23 1000  doxycycline (VIBRA-TABS) tablet 100 mg  Status:  Discontinued        100 mg Oral Every 12 hours 12/28/23 1823 01/02/24 0656   12/29/23 1000  cefTRIAXone (ROCEPHIN) 1 g in sodium chloride 0.9 % 100 mL IVPB  Status:  Discontinued        1 g 200 mL/hr over 30 Minutes Intravenous Every 24 hours 12/28/23 1823 01/02/24 0656   12/29/23 1000  hydroxychloroquine (PLAQUENIL) tablet 200 mg       Note to Pharmacy: Pt reported changed by Rheumatology     200 mg Oral 2 times daily 12/28/23 2029     12/28/23 1430  cefTRIAXone (ROCEPHIN) 1 g in sodium chloride 0.9 % 100 mL IVPB        1 g 200 mL/hr over 30 Minutes Intravenous  Once 12/28/23 1418 12/28/23 1502   12/28/23 1430  azithromycin (ZITHROMAX) 500 mg in sodium chloride 0.9 % 250 mL IVPB        500 mg 250 mL/hr over 60 Minutes Intravenous  Once 12/28/23 1418 12/28/23 1538        MEDICATIONS: Scheduled Meds:  allopurinol  300 mg Oral Daily   amoxicillin-clavulanate  1 tablet Oral Q12H   aspirin EC  81 mg Oral Daily   atorvastatin  40 mg Oral Daily   furosemide  80 mg Oral Daily   heparin  5,000 Units Subcutaneous Q8H   hydroxychloroquine  200 mg Oral BID    insulin aspart  0-9 Units Subcutaneous TID WC   Continuous Infusions:   PRN Meds:.acetaminophen **OR** acetaminophen, albuterol, benzonatate, diazepam, HYDROcodone-acetaminophen   I have personally reviewed following labs and imaging studies  LABORATORY DATA: CBC: Recent Labs  Lab 12/28/23 1359 12/28/23 1939 12/29/23 0456 12/30/23 0441 12/31/23 0500  WBC 11.1*  --  9.8 11.0* 12.3*  NEUTROABS 9.6*  --   --  9.5*  --   HGB 11.7* 12.2 11.6* 11.1* 11.9*  HCT 35.3* 36.0 35.0* 32.4* 36.4  MCV 86.7  --  86.0 85.7 88.1  PLT 221  --  215 237 255    Basic Metabolic Panel: Recent Labs  Lab 12/28/23 1746 12/28/23 1939 12/29/23 0456 12/30/23 0441 12/31/23 0500 01/01/24 0510 01/02/24 0537  NA  --    < > 128* 134* 135 132* 134*  K  --    < >  4.1 3.3* 4.3 3.6 3.3*  CL  --   --  93* 95* 98 94* 93*  CO2  --   --  21* 30 30 30 30   GLUCOSE  --   --  112* 107* 80 81 82  BUN  --   --  27* 18 18 19 18   CREATININE  --   --  1.20* 0.91 0.94 0.83 0.85  CALCIUM  --   --  9.6 9.4 9.2 8.7* 8.7*  MG 1.9  --   --  1.7  --   --   --   PHOS  --   --   --  4.0  --   --   --    < > = values in this interval not displayed.    GFR: Estimated Creatinine Clearance: 55.2 mL/min (by C-G formula based on SCr of 0.85 mg/dL).  Liver Function Tests: Recent Labs  Lab 12/28/23 1359 12/30/23 0441  AST 34 62*  ALT 31 40  ALKPHOS 139* 122  BILITOT 0.7 0.8  PROT 6.5 5.8*  ALBUMIN 3.8 3.4*   No results for input(s): "LIPASE", "AMYLASE" in the last 168 hours. No results for input(s): "AMMONIA" in the last 168 hours.  Coagulation Profile: Recent Labs  Lab 12/28/23 1359  INR 1.2    Cardiac Enzymes: No results for input(s): "CKTOTAL", "CKMB", "CKMBINDEX", "TROPONINI" in the last 168 hours.  BNP (last 3 results) Recent Labs    08/19/23 1145  PROBNP 237.0*    Lipid Profile: No results for input(s): "CHOL", "HDL", "LDLCALC", "TRIG", "CHOLHDL", "LDLDIRECT" in the last 72  hours.  Thyroid Function Tests: No results for input(s): "TSH", "T4TOTAL", "FREET4", "T3FREE", "THYROIDAB" in the last 72 hours.   Anemia Panel: No results for input(s): "VITAMINB12", "FOLATE", "FERRITIN", "TIBC", "IRON", "RETICCTPCT" in the last 72 hours.   Urine analysis:    Component Value Date/Time   COLORURINE YELLOW 12/29/2023 0104   APPEARANCEUR HAZY (A) 12/29/2023 0104   LABSPEC 1.008 12/29/2023 0104   PHURINE 5.0 12/29/2023 0104   GLUCOSEU NEGATIVE 12/29/2023 0104   GLUCOSEU NEGATIVE 08/27/2021 1045   HGBUR NEGATIVE 12/29/2023 0104   HGBUR large 09/07/2010 1338   BILIRUBINUR NEGATIVE 12/29/2023 0104   BILIRUBINUR n 08/24/2019 1035   KETONESUR NEGATIVE 12/29/2023 0104   PROTEINUR 30 (A) 12/29/2023 0104   UROBILINOGEN 0.2 08/27/2021 1045   NITRITE NEGATIVE 12/29/2023 0104   LEUKOCYTESUR MODERATE (A) 12/29/2023 0104    Sepsis Labs: Lactic Acid, Venous    Component Value Date/Time   LATICACIDVEN 1.9 12/28/2023 1425    MICROBIOLOGY: Recent Results (from the past 240 hours)  Resp panel by RT-PCR (RSV, Flu A&B, Covid) Anterior Nasal Swab     Status: None   Collection Time: 12/28/23  1:59 PM   Specimen: Anterior Nasal Swab  Result Value Ref Range Status   SARS Coronavirus 2 by RT PCR NEGATIVE NEGATIVE Final   Influenza A by PCR NEGATIVE NEGATIVE Final   Influenza B by PCR NEGATIVE NEGATIVE Final    Comment: (NOTE) The Xpert Xpress SARS-CoV-2/FLU/RSV plus assay is intended as an aid in the diagnosis of influenza from Nasopharyngeal swab specimens and should not be used as a sole basis for treatment. Nasal washings and aspirates are unacceptable for Xpert Xpress SARS-CoV-2/FLU/RSV testing.  Fact Sheet for Patients: BloggerCourse.com  Fact Sheet for Healthcare Providers: SeriousBroker.it  This test is not yet approved or cleared by the [place] FDA and has been authorized for detection and/or diagnosis of  SARS-CoV-2  by FDA under an Emergency Use Authorization (EUA). This EUA will remain in effect (meaning this test can be used) for the duration of the COVID-19 declaration under Section 564(b)(1) of the Act, 21 U.S.C. section 360bbb-3(b)(1), unless the authorization is terminated or revoked.     Resp Syncytial Virus by PCR NEGATIVE NEGATIVE Final    Comment: (NOTE) Fact Sheet for Patients: BloggerCourse.com  Fact Sheet for Healthcare Providers: SeriousBroker.it  This test is not yet approved or cleared by the [place] FDA and has been authorized for detection and/or diagnosis of SARS-CoV-2 by FDA under an Emergency Use Authorization (EUA). This EUA will remain in effect (meaning this test can be used) for the duration of the COVID-19 declaration under Section 564(b)(1) of the Act, 21 U.S.C. section 360bbb-3(b)(1), unless the authorization is terminated or revoked.  Performed at [facility], [address] [place], [place] [address]   Respiratory (~20 pathogens) panel by PCR     Status: None   Collection Time: 12/28/23  1:59 PM   Specimen: Nasopharyngeal Swab; Respiratory  Result Value Ref Range Status   Adenovirus NOT DETECTED NOT DETECTED Final   Coronavirus 229E NOT DETECTED NOT DETECTED Final    Comment: (NOTE) The Coronavirus on the Respiratory Panel, DOES NOT test for the novel  Coronavirus (2019 nCoV)    Coronavirus HKU1 NOT DETECTED NOT DETECTED Final   Coronavirus NL63 NOT DETECTED NOT DETECTED Final   Coronavirus OC43 NOT DETECTED NOT DETECTED Final   Metapneumovirus NOT DETECTED NOT DETECTED Final   Rhinovirus / Enterovirus NOT DETECTED NOT DETECTED Final   Influenza A NOT DETECTED NOT DETECTED Final   Influenza B NOT DETECTED NOT DETECTED Final   Parainfluenza Virus 1 NOT DETECTED NOT DETECTED Final   Parainfluenza Virus 2 NOT DETECTED NOT DETECTED Final   Parainfluenza Virus 3 NOT DETECTED NOT  DETECTED Final   Parainfluenza Virus 4 NOT DETECTED NOT DETECTED Final   Respiratory Syncytial Virus NOT DETECTED NOT DETECTED Final   Bordetella pertussis NOT DETECTED NOT DETECTED Final   Bordetella Parapertussis NOT DETECTED NOT DETECTED Final   Chlamydophila pneumoniae NOT DETECTED NOT DETECTED Final   Mycoplasma pneumoniae NOT DETECTED NOT DETECTED Final    Comment: Performed at [facility], [address] [place], [place] [address]  Blood Culture (routine x 2)     Status: None   Collection Time: 12/28/23  2:16 PM   Specimen: BLOOD  Result Value Ref Range Status   Specimen Description BLOOD BLOOD RIGHT FOREARM  Final   Special Requests   Final    BOTTLES DRAWN AEROBIC AND ANAEROBIC Blood Culture results may not be optimal due to an inadequate volume of blood received in culture bottles   Culture   Final    NO GROWTH 5 DAYS Performed at [facility], [address], [place], [place] [address]    Report Status 01/02/2024 FINAL  Final  Blood Culture (routine x 2)     Status: None   Collection Time: 12/28/23  2:17 PM   Specimen: BLOOD  Result Value Ref Range Status   Specimen Description BLOOD BLOOD LEFT HAND  Final   Special Requests   Final    BOTTLES DRAWN AEROBIC AND ANAEROBIC Blood Culture results may not be optimal due to an inadequate volume of blood received in culture bottles   Culture   Final    NO GROWTH 5 DAYS Performed at [facility], [address] [place],  [place] [address]    Report Status 01/02/2024 FINAL  Final    RADIOLOGY STUDIES/RESULTS: No results found.    LOS: 5 days   Jeoffrey Massed, MD  [facility]    To contact the attending provider between 7A-7P or the covering provider during after hours 7P-7A, please log into the web site [email or web address] and access using universal [place] password for that web site. If you do not have the password, please call the hospital operator.  01/02/2024, 11:13 AM

## 2024-01-02 NOTE — TOC Progression Note (Addendum)
Transition of Care [facility]) - Progression Note    Patient Details  Name: Deborah Bryant MRN: [id number] Date of Birth: [birth year]  Transition of [facility]) CM/SW Contact  Mearl Latin, LCSW Phone Number: 01/02/2024, 9:41 AM  Clinical Narrative:    9:41am-CSW and MSW Intern met with patient and made her aware that she is medically stable for discharge. Patient reported that her family is touring facilities today so she does not want to leave today. She stated she will appeal the discharge if it is today. CSW will update MD.   1:30 PM-CSW received call from [facility] stating that patient's family called and requested a bed there. They can offer a bed tomorrow if stable.   Expected Discharge Plan: Skilled Nursing Facility    Expected Discharge Plan and Services In-house Referral: Clinical Social Work     Living arrangements for the past 2 months: Single Family Home                                       Social Determinants of Health (SDOH) Interventions SDOH Screenings   Food Insecurity: No Food Insecurity (12/28/2023)  Housing: Low Risk  (12/28/2023)  Transportation Needs: No Transportation Needs (12/28/2023)  Utilities: Not At Risk (12/28/2023)  Depression (PHQ2-9): Medium Risk (12/03/2023)  Social Connections: Moderately Isolated (12/28/2023)  Tobacco Use: High Risk (12/28/2023)    Readmission Risk Interventions     No data to display

## 2024-01-02 NOTE — Progress Notes (Signed)
Physical Therapy Treatment Patient Details Name: Deborah Bryant MRN: [id number] DOB: [birth year] Today's Date: 01/02/2024   History of Present Illness Pt is 78 yo female who presents on 12/28/23 with hypoxia, bradycardia, and AMS. PMH: pneumonia, MRSA 2006, HTN, HLD, GERD, DM, dyspnea, COPD, CKD, L periorbital cellulitis, L4-5 fusion, chronic pain    PT Comments  Pt received in supine and agreeable to session. Pt able to tolerate increased gait distance this session with 3 standing rest breaks due to fatigue. SpO2 stable on 2L throughout session. Pt demonstrates good stability with rollator support. Pt reports increased fatigue after gait session and defers further mobility. Pt continues to benefit from PT services to progress toward functional mobility goals.     If plan is discharge home, recommend the following: A little help with walking and/or transfers;A little help with bathing/dressing/bathroom;Assist for transportation;Assistance with cooking/housework   Can travel by private vehicle        Equipment Recommendations  None recommended by PT    Recommendations for Other Services       Precautions / Restrictions Precautions Precautions: Other (comment) Precaution Comments: watch O2 sats     Mobility  Bed Mobility Overal bed mobility: Modified Independent                  Transfers Overall transfer level: Needs assistance Equipment used: None Transfers: Sit to/from Stand Sit to Stand: Supervision           General transfer comment: from low EOB    Ambulation/Gait Ambulation/Gait assistance: Supervision Gait Distance (Feet): 200 Feet Assistive device: Rollator (4 wheels) Gait Pattern/deviations: Step-through pattern, Trunk flexed, Decreased stride length       General Gait Details: slow, steady gait with rollator support. Multiple standing rest breaks due to fatigue      Balance Overall balance assessment: Needs assistance Sitting-balance support: No  upper extremity supported, Feet supported Sitting balance-Leahy Scale: Good     Standing balance support: During functional activity, Bilateral upper extremity supported Standing balance-Leahy Scale: Fair Standing balance comment: with rollator support                            Cognition Arousal: Alert Behavior During Therapy: WFL for tasks assessed/performed, Flat affect Overall Cognitive Status: Within Functional Limits for tasks assessed                                 General Comments: delayed response time and few verbalizations during session        Exercises      General Comments General comments (skin integrity, edema, etc.): VSS on 2L. Pt performing pursed lip breathing without cues      Pertinent Vitals/Pain Pain Assessment Pain Assessment: No/denies pain     PT Goals (current goals can now be found in the care plan section) Acute Rehab PT Goals Patient Stated Goal: return home PT Goal Formulation: With patient/family Time For Goal Achievement: 01/12/24 Progress towards PT goals: Progressing toward goals    Frequency    Min 1X/week       AM-PAC PT "6 Clicks" Mobility   Outcome Measure  Help needed turning from your back to your side while in a flat bed without using bedrails?: None Help needed moving from lying on your back to sitting on the side of a flat bed without using bedrails?: None Help needed moving to and  from a bed to a chair (including a wheelchair)?: A Little Help needed standing up from a chair using your arms (e.g., wheelchair or bedside chair)?: A Little Help needed to walk in hospital room?: A Little Help needed climbing 3-5 steps with a railing? : A Little 6 Click Score: 20    End of Session Equipment Utilized During Treatment: Oxygen Activity Tolerance: Patient tolerated treatment well;Patient limited by fatigue Patient left: in bed;with call bell/phone within reach;with family/visitor present Nurse  Communication: Mobility status PT Visit Diagnosis: Muscle weakness (generalized) (M62.81);Difficulty in walking, not elsewhere classified (R26.2)     Time: 1545-1610 PT Time Calculation (min) (ACUTE ONLY): 25 min  Charges:    $Gait Training: 23-37 mins PT General Charges $$ ACUTE PT VISIT: 1 Visit                     Johny Shock, PTA Acute Rehabilitation Services Secure Chat Preferred  [phone]    Johny Shock 01/02/2024, 4:25 PM

## 2024-01-03 DIAGNOSIS — G9340 Encephalopathy, unspecified: Secondary | ICD-10-CM | POA: Diagnosis not present

## 2024-01-03 DIAGNOSIS — E785 Hyperlipidemia, unspecified: Secondary | ICD-10-CM | POA: Diagnosis not present

## 2024-01-03 DIAGNOSIS — E119 Type 2 diabetes mellitus without complications: Secondary | ICD-10-CM | POA: Diagnosis not present

## 2024-01-03 DIAGNOSIS — J189 Pneumonia, unspecified organism: Secondary | ICD-10-CM | POA: Diagnosis not present

## 2024-01-03 DIAGNOSIS — E871 Hypo-osmolality and hyponatremia: Secondary | ICD-10-CM | POA: Diagnosis not present

## 2024-01-03 DIAGNOSIS — G8929 Other chronic pain: Secondary | ICD-10-CM | POA: Diagnosis not present

## 2024-01-03 DIAGNOSIS — I10 Essential (primary) hypertension: Secondary | ICD-10-CM | POA: Diagnosis not present

## 2024-01-03 DIAGNOSIS — E559 Vitamin D deficiency, unspecified: Secondary | ICD-10-CM | POA: Diagnosis not present

## 2024-01-03 DIAGNOSIS — N170 Acute kidney failure with tubular necrosis: Secondary | ICD-10-CM | POA: Diagnosis not present

## 2024-01-03 DIAGNOSIS — N182 Chronic kidney disease, stage 2 (mild): Secondary | ICD-10-CM

## 2024-01-03 DIAGNOSIS — R2681 Unsteadiness on feet: Secondary | ICD-10-CM | POA: Diagnosis not present

## 2024-01-03 DIAGNOSIS — I5033 Acute on chronic diastolic (congestive) heart failure: Secondary | ICD-10-CM | POA: Diagnosis not present

## 2024-01-03 DIAGNOSIS — R2689 Other abnormalities of gait and mobility: Secondary | ICD-10-CM | POA: Diagnosis not present

## 2024-01-03 DIAGNOSIS — D649 Anemia, unspecified: Secondary | ICD-10-CM | POA: Diagnosis not present

## 2024-01-03 DIAGNOSIS — J9601 Acute respiratory failure with hypoxia: Secondary | ICD-10-CM | POA: Diagnosis not present

## 2024-01-03 DIAGNOSIS — M6281 Muscle weakness (generalized): Secondary | ICD-10-CM | POA: Diagnosis not present

## 2024-01-03 DIAGNOSIS — Z7401 Bed confinement status: Secondary | ICD-10-CM | POA: Diagnosis not present

## 2024-01-03 DIAGNOSIS — M109 Gout, unspecified: Secondary | ICD-10-CM | POA: Diagnosis not present

## 2024-01-03 DIAGNOSIS — R0902 Hypoxemia: Secondary | ICD-10-CM | POA: Diagnosis not present

## 2024-01-03 LAB — CBC
HCT: 39.5 % (ref 36.0–46.0)
Hemoglobin: 12.8 g/dL (ref 12.0–15.0)
MCH: 28.6 pg (ref 26.0–34.0)
MCHC: 32.4 g/dL (ref 30.0–36.0)
MCV: 88.2 fL (ref 80.0–100.0)
Platelets: 233 10*3/uL (ref 150–400)
RBC: 4.48 MIL/uL (ref 3.87–5.11)
RDW: 15.6 % — ABNORMAL HIGH (ref 11.5–15.5)
WBC: 10.2 10*3/uL (ref 4.0–10.5)
nRBC: 0 % (ref 0.0–0.2)

## 2024-01-03 LAB — BASIC METABOLIC PANEL
Anion gap: 11 (ref 5–15)
BUN: 17 mg/dL (ref 8–23)
CO2: 30 mmol/L (ref 22–32)
Calcium: 9 mg/dL (ref 8.9–10.3)
Chloride: 95 mmol/L — ABNORMAL LOW (ref 98–111)
Creatinine, Ser: 0.84 mg/dL (ref 0.44–1.00)
GFR, Estimated: >60 mL/min (ref >60)
Glucose, Bld: 93 mg/dL (ref 70–99)
Potassium: 3.6 mmol/L (ref 3.5–5.1)
Sodium: 136 mmol/L (ref 135–145)

## 2024-01-03 LAB — GLUCOSE, CAPILLARY: Glucose-Capillary: 120 mg/dL — ABNORMAL HIGH (ref 70–99)

## 2024-01-03 MED ORDER — HYDROCODONE-ACETAMINOPHEN 5-325 MG PO TABS: 1.0000 | ORAL_TABLET | Freq: Four times a day (QID) | ORAL | 0 refills | Status: DC | PRN

## 2024-01-03 MED ORDER — BENZONATATE 200 MG PO CAPS: 200.0000 mg | ORAL_CAPSULE | Freq: Three times a day (TID) | ORAL | Status: DC | PRN

## 2024-01-03 MED ORDER — DIAZEPAM 5 MG PO TABS: 5.0000 mg | ORAL_TABLET | Freq: Three times a day (TID) | ORAL | 0 refills | Status: DC | PRN

## 2024-01-03 MED ORDER — AMOXICILLIN-POT CLAVULANATE 875-125 MG PO TABS: 1.0000 | ORAL_TABLET | Freq: Two times a day (BID) | ORAL | Status: AC

## 2024-01-03 MED ORDER — LASIX 20 MG PO TABS: 80.0000 mg | ORAL_TABLET | Freq: Every day | ORAL | Status: DC

## 2024-01-03 MED ORDER — POTASSIUM CHLORIDE ER 10 MEQ PO TBCR
20.0000 meq | EXTENDED_RELEASE_TABLET | Freq: Every day | ORAL | Status: AC
Start: 2024-01-03 — End: ?

## 2024-01-03 NOTE — Progress Notes (Signed)
Report given to [facility].

## 2024-01-03 NOTE — Plan of Care (Signed)
Pt has rested quietly throughout the night with no distress noted. Alert and oriented. On O2 3LNC. SR/SB on the monitor. Up to [facility]: [name] to void. Medicated for pain once with relief noted. Medicated for anxiety once with relief noted. No other complaints voiced.     Problem: Clinical Measurements: Goal: Ability to maintain clinical measurements within normal limits will improve Outcome: Progressing Goal: Respiratory complications will improve Outcome: Progressing   Problem: Activity: Goal: Risk for activity intolerance will decrease Outcome: Progressing   Problem: Nutrition: Goal: Adequate nutrition will be maintained Outcome: Progressing   Problem: Coping: Goal: Level of anxiety will decrease Outcome: Progressing   Problem: Elimination: Goal: Will not experience complications related to bowel motility Outcome: Progressing   Problem: Pain Management: Goal: General experience of comfort will improve Outcome: Progressing

## 2024-01-03 NOTE — Discharge Summary (Signed)
PATIENT DETAILS Name: Deborah Bryant Age: 78 y.o. Sex: female Date of Birth: [birth year] MRN: [id number]. Admitting Physician: Orland Mustard, MD PCP:Fry, Tera Mater, MD  Admit Date: 12/28/2023 Discharge date: 01/03/2024  Recommendations for Outpatient Follow-up:  Follow up with PCP in 1-2 weeks Please obtain CMP/CBC in one week Please ensure follow-up with cardiology, GYN Repeat two-view chest x-ray in 4 to 6 weeks Slowly titrate off oxygen-currently requiring just 1-2 L.  Admitted From:  Home  Disposition: Skilled nursing facility   Discharge Condition: good  CODE STATUS:   Code Status: Full Code   Diet recommendation:  Diet Order             Diet - low sodium heart healthy           Diet Carb Modified           Diet heart healthy/carb modified Fluid consistency: Thin  Diet effective now                    Brief Summary: Patient is a 78 y.o.  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   Significant events: 1/12>> admit to [facility]   Significant studies: 1/12>> CXR: Left lower PNA with small pleural effusion 1/12>> CT head: No acute intracranial abnormality 1/13>> echo: EF 60-65%, grade 1 diastolic dysfunction. 1/15>> CXR: Persistent atelectasis/consolidation in the left lower lobe   Significant microbiology data: 1/12>> COVID/influenza/RSV PCR: Negative 1/12>> respiratory virus panel: Negative 1/12>> blood culture: Negative   Procedures: None   Consults: Cardiology  Brief Hospital Course: Acute hypoxic respiratory failure Felt to be secondary to PNA/HFpEF exacerbation Clinically improved after treating underlying etiologies Initially was requiring up to 12 L of oxygen-now down just to 1-2 L of oxygen this morning Continue to slowly titrate off oxygen while at SNF.    Acute on chronic HFpEF Significant improvement-now euvolemic  Initially on IV Lasix-has been transitioned to oral Lasix. Ensure follow-up with cardiology.   LLL PNA Clinically improved-initially on IV antibiotics-has been switched to Augmentin which will be continued for a few more days.   Could have aspirated when she was confused Appreciate SLP eval-tolerating regular diet. PCP to ensure two-view chest x-ray in 4 to 6 weeks to document resolution of pneumonia.  Acute metabolic encephalopathy Secondary to PNA/hypothermia/hypoxemia CT imaging negative Encephalopathy has resolved-she is completely awake and alert.   Hyponatremia Secondary to volume overload-improving with diuretics   Hypokalemia Replete/recheck.   AKI Likely hemodynamically mediated Resolved.   Sinus bradycardia Telemetry monitoring TSH minimally elevated-and unlikely causing bradycardia.  Likely due to hypothermia/Cardizem use (on hold).   HLD Statin   DM-2 (A1c 6.3 on 08/19/2023) CBG stable SSI while inpatient Oral hypoglycemic medications to be resumed on discharge  History of RA Plaquenil   Gout No flare Allopurinol   Chronic pain syndrome As needed narcotics   Vaginal bleeding Occurred on 1/17-upon further evaluation-it appears that this is not a new issue-has been ongoing intermittently for the past several months. Pelvic exam performed with Louretta Parma, RN as chaperone-no active bleeding noted-no blood noted around the rectum. Discussed with patient/multiple family members at bedside-Will need outpatient GYN evaluation   Debility/deconditioning Secondary to acute illness SNF planned.   Class 1 Obesity: Estimated body mass index is 33.35 kg/m as calculated from the following:   Height as of this  encounter: 5\' 2"  (1.575 m).   Weight as of this encounter: 82.7 kg  Discharge Diagnoses:  Principal Problem:   Acute respiratory failure with hypoxia (HCC) Active Problems:   Acute  on chronic diastolic CHF (congestive heart failure) (HCC)   Acute renal failure superimposed on stage 2 chronic kidney disease (HCC)   Hypothermia   Prolonged QT interval   Acute encephalopathy   Left lower lobe pneumonia   Hyponatremia   Essential hypertension   Diabetes mellitus without complication (HCC)   Chronic pain   Seronegative inflammatory arthritis   Hyperlipemia   Gout   Discharge Instructions:  Activity:  As tolerated with Full fall precautions use walker/cane & assistance as needed  Discharge Instructions     (HEART FAILURE PATIENTS) Call MD:  Anytime you have any of the following symptoms: 1) 3 pound weight gain in 24 hours or 5 pounds in 1 week 2) shortness of breath, with or without a dry hacking cough 3) swelling in the hands, feet or stomach 4) if you have to sleep on extra pillows at night in order to breathe.   Complete by: As directed    Call MD for:  difficulty breathing, headache or visual disturbances   Complete by: As directed    Diet - low sodium heart healthy   Complete by: As directed    Diet Carb Modified   Complete by: As directed    Discharge instructions   Complete by: As directed    Follow with Primary MD  Nelwyn Salisbury, MD in 1-2 weeks  Please get a complete blood count and chemistry panel checked by your Primary MD at your next visit, and again as instructed by your Primary MD.  Get Medicines reviewed and adjusted: Please take all your medications with you for your next visit with your Primary MD  Laboratory/radiological data: Please request your Primary MD to go over all hospital tests and procedure/radiological results at the follow up, please ask your Primary MD to get all Hospital records sent to his/her office.  In some cases, they will be blood work, cultures and biopsy results pending at the time of your discharge. Please request that your primary care M.D. follows up on these results.  Also Note the following: If you experience  worsening of your admission symptoms, develop shortness of breath, life threatening emergency, suicidal or homicidal thoughts you must seek medical attention immediately by calling 911 or calling your MD immediately  if symptoms less severe.  You must read complete instructions/literature along with all the possible adverse reactions/side effects for all the Medicines you take and that have been prescribed to you. Take any new Medicines after you have completely understood and accpet all the possible adverse reactions/side effects.   Do not drive when taking Pain medications or sleeping medications (Benzodaizepines)  Do not take more than prescribed Pain, Sleep and Anxiety Medications. It is not advisable to combine anxiety,sleep and pain medications without talking with your primary care practitioner  Special Instructions: If you have smoked or chewed Tobacco  in the last 2 yrs please stop smoking, stop any regular Alcohol  and or any Recreational drug use.  Wear Seat belts while driving.  Please note: You were cared for by a hospitalist during your hospital stay. Once you are discharged, your primary care physician will handle any further medical issues. Please note that NO REFILLS for any discharge medications will be authorized once you are discharged, as it is imperative that you return  to your primary care physician (or establish a relationship with a primary care physician if you do not have one) for your post hospital discharge needs so that they can reassess your need for medications and monitor your lab values.   Increase activity slowly   Complete by: As directed       Allergies as of 01/03/2024       Reactions   Losartan Shortness Of Breath   Benazepril Cough   Lasix [furosemide] Other (See Comments)   Rash, changes in eyesight   Levaquin [levofloxacin] Other (See Comments)   Tendon and muscle pain   Phenobarbital Nausea And Vomiting        Medication List     STOP  taking these medications    diltiazem 360 MG 24 hr capsule Commonly known as: Cardizem CD       TAKE these medications    allopurinol 300 MG tablet Commonly known as: ZYLOPRIM Take 1 tablet (300 mg total) by mouth daily.   amoxicillin-clavulanate 875-125 MG tablet Commonly known as: AUGMENTIN Take 1 tablet by mouth every 12 (twelve) hours for 1 day.   aspirin EC 81 MG tablet Take 81 mg by mouth daily.   atorvastatin 40 MG tablet Commonly known as: LIPITOR Take 1 tablet (40 mg total) by mouth daily.   benzonatate 200 MG capsule Commonly known as: TESSALON Take 1 capsule (200 mg total) by mouth 3 (three) times daily as needed for cough.   diazepam 5 MG tablet Commonly known as: VALIUM Take 1-2 tablets (5-10 mg total) by mouth every 8 (eight) hours as needed for muscle spasms.   glipiZIDE 5 MG 24 hr tablet Commonly known as: GLUCOTROL XL Take 1 tablet (5 mg total) by mouth daily with breakfast.   HYDROcodone-acetaminophen 5-325 MG tablet Commonly known as: Norco Take 1 tablet by mouth every 6 (six) hours as needed for moderate pain (pain score 4-6).   hydroxychloroquine 200 MG tablet Commonly known as: PLAQUENIL Take 200 mg by mouth 2 (two) times daily. Pt reported changed by Rheumatology   Lasix 20 MG tablet Generic drug: furosemide Take 4 tablets (80 mg total) by mouth daily. Taking 2 tablets daily What changed:  how much to take when to take this   polyethylene glycol powder 17 GM/SCOOP powder Commonly known as: GLYCOLAX/MIRALAX Take 8.5-17 g by mouth at bedtime.   potassium chloride 10 MEQ tablet Commonly known as: Klor-Con 10 Take 2 tablets (20 mEq total) by mouth daily. What changed: how much to take   saccharomyces boulardii 250 MG capsule Commonly known as: FLORASTOR Take 250 mg by mouth daily.   vitamin C 1000 MG tablet Take 1,000 mg by mouth daily.   Vitamin D 50 MCG (2000 UT) Caps Take 2,000 Units by mouth daily. OTC        Follow-up  Information     Nelwyn Salisbury, MD. Schedule an appointment as soon as possible for a visit in 1 week(s).   Specialty: Family Medicine Contact information: [address] [place] [address] [phone]         Chrystie Nose, MD. Schedule an appointment as soon as possible for a visit in 2 week(s).   Specialty: Cardiology Contact information: [address] [place] [address] [place] [address] [phone]                Allergies  Allergen Reactions   Losartan Shortness Of Breath   Benazepril Cough   Lasix [Furosemide] Other (See Comments)  Rash, changes in eyesight   Levaquin [Levofloxacin] Other (See Comments)    Tendon and muscle pain   Phenobarbital Nausea And Vomiting     Other Procedures/Studies: DG Chest Port 1 View Result Date: 12/31/2023 CLINICAL DATA:  78 year old female with history of shortness of breath. Bradycardia. EXAM: PORTABLE CHEST 1 VIEW COMPARISON:  Chest x-ray 12/28/2023. FINDINGS: Lung volumes are normal. Opacity at the left base which may reflect atelectasis and/or consolidation, with superimposed small left pleural effusion. Right lung appears clear. No right pleural effusion. No pneumothorax. No evidence of pulmonary edema. Heart size appears borderline enlarged. Upper mediastinal contours are within normal limits. Atherosclerotic calcifications are noted in the thoracic aorta. IMPRESSION: 1. Persistent atelectasis and/or consolidation in the left lower lobe with small left pleural effusion. 2. Aortic atherosclerosis. Electronically Signed   By: Trudie Reed M.D.   On: 12/31/2023 06:49   ECHOCARDIOGRAM COMPLETE Result Date: 12/29/2023    ECHOCARDIOGRAM REPORT   Patient Name:   Deborah Bryant Date of Exam: 12/29/2023 Medical Rec #:  [id number]      Height:       62.0 in Accession #:    [id number]     Weight:       187.0 lb Date of Birth:  1946-04-18       BSA:          1.858 m Patient Age:    77 years       BP:           201/191  mmHg Patient Gender: F              HR:           81 bpm. Exam Location:  Inpatient Procedure: 2D Echo, Cardiac Doppler and Color Doppler Indications:    CHF-Acute Systolic I50.21  History:        Patient has prior history of Echocardiogram examinations, most                 recent 09/08/2023. Risk Factors:Hypertension and Diabetes.  Sonographer:    Harriette Bouillon RDCS Referring Phys: [id number] ALLISON WOLFE IMPRESSIONS  1. Left ventricular ejection fraction, by estimation, is 60 to 65%. The left ventricle has normal function. The left ventricle has no regional wall motion abnormalities. Left ventricular diastolic parameters are consistent with Grade I diastolic dysfunction (impaired relaxation).  2. Right ventricular systolic function is normal. The right ventricular size is normal. There is moderately elevated pulmonary artery systolic pressure.  3. Left atrial size was mildly dilated.  4. The mitral valve is grossly normal. Mild mitral valve regurgitation.  5. The aortic valve was not well visualized. Aortic valve regurgitation is not visualized.  6. The inferior vena cava is dilated in size with <50% respiratory variability, suggesting right atrial pressure of 15 mmHg. FINDINGS  Left Ventricle: Left ventricular ejection fraction, by estimation, is 60 to 65%. The left ventricle has normal function. The left ventricle has no regional wall motion abnormalities. The left ventricular internal cavity size was normal in size. There is  no left ventricular hypertrophy. Left ventricular diastolic parameters are consistent with Grade I diastolic dysfunction (impaired relaxation). Right Ventricle: The right ventricular size is normal. Right ventricular systolic function is normal. There is moderately elevated pulmonary artery systolic pressure. The tricuspid regurgitant velocity is 2.74 m/s, and with an assumed right atrial pressure of 15 mmHg, the estimated right ventricular systolic pressure is 45.0 mmHg. Left Atrium: Left  atrial size was mildly dilated. Right Atrium:  Right atrial size was normal in size. Pericardium: There is no evidence of pericardial effusion. Mitral Valve: The mitral valve is grossly normal. Mild mitral valve regurgitation. Tricuspid Valve: Tricuspid valve regurgitation is mild. Aortic Valve: The aortic valve was not well visualized. Aortic valve regurgitation is not visualized. Pulmonic Valve: Pulmonic valve regurgitation is not visualized. Aorta: The aortic root and ascending aorta are structurally normal, with no evidence of dilitation. Venous: The inferior vena cava is dilated in size with less than 50% respiratory variability, suggesting right atrial pressure of 15 mmHg. IAS/Shunts: The interatrial septum was not well visualized.  LEFT VENTRICLE PLAX 2D LVIDd:         5.60 cm   Diastology LVIDs:         3.50 cm   LV e' medial:    9.36 cm/s LV PW:         0.80 cm   LV E/e' medial:  12.4 LV IVS:        0.80 cm   LV e' lateral:   9.36 cm/s LVOT diam:     1.90 cm   LV E/e' lateral: 12.4 LV SV:         104 LV SV Index:   56 LVOT Area:     2.84 cm  RIGHT VENTRICLE         IVC TAPSE (M-mode): 2.4 cm  IVC diam: 2.60 cm LEFT ATRIUM             Index        RIGHT ATRIUM           Index LA diam:        4.40 cm 2.37 cm/m   RA Area:     19.00 cm LA Vol (A2C):   88.2 ml 47.47 ml/m  RA Volume:   55.90 ml  30.09 ml/m LA Vol (A4C):   72.3 ml 38.92 ml/m LA Biplane Vol: 83.8 ml 45.11 ml/m  AORTIC VALVE LVOT Vmax:   136.00 cm/s LVOT Vmean:  104.000 cm/s LVOT VTI:    0.368 m  AORTA Ao Root diam: 2.60 cm Ao Asc diam:  2.70 cm MITRAL VALVE                TRICUSPID VALVE MV Area (PHT): 4.57 cm     TR Peak grad:   30.0 mmHg MV Decel Time: 166 msec     TR Vmax:        274.00 cm/s MV E velocity: 116.00 cm/s MV A velocity: 125.00 cm/s  SHUNTS MV E/A ratio:  0.93         Systemic VTI:  0.37 m                             Systemic Diam: 1.90 cm Carolan Clines Electronically signed by Carolan Clines Signature Date/Time: 12/29/2023/10:06:47  AM    Final    CT HEAD WO CONTRAST ( ) Result Date: 12/28/2023 CLINICAL DATA:  Delirium. EXAM: CT HEAD WITHOUT CONTRAST TECHNIQUE: Contiguous axial images were obtained from the base of the skull through the vertex without intravenous contrast. RADIATION DOSE REDUCTION: This exam was performed according to the departmental dose-optimization program which includes automated exposure control, adjustment of the mA and/or kV according to patient size and/or use of iterative reconstruction technique. COMPARISON:  Brain MRI 11/01/2018 FINDINGS: Brain: No intracranial hemorrhage, mass effect, or midline shift. Brain volume is normal for age. No hydrocephalus. The basilar cisterns are  patent. Mild periventricular and deep white matter hypodensity typical of chronic small vessel ischemia. No evidence of territorial infarct or acute ischemia. No extra-axial or intracranial fluid collection. Vascular: No hyperdense vessel or unexpected calcification. Skull: No fracture or focal lesion. Sinuses/Orbits: Paranasal sinuses and mastoid air cells are clear. The visualized orbits are unremarkable. Other: None. IMPRESSION: 1.  No acute intracranial abnormality. 2. Mild for age chronic small vessel ischemic change. Electronically Signed   By: Narda Rutherford M.D.   On: 12/28/2023 20:29   DG Chest Port 1 View Result Date: 12/28/2023 CLINICAL DATA:  Shortness of breath and bradycardia. EXAM: PORTABLE CHEST 1 VIEW COMPARISON:  Chest x-ray dated November 02, 2018. FINDINGS: The heart size and mediastinal contours are within normal limits. Chronic interstitial coarsening is similar to prior study and likely smoking-related. Dense left lower lobe consolidation with small left pleural effusion. Clear right lung. No pneumothorax. No acute osseous abnormality. IMPRESSION: 1. Left lower lobe pneumonia with small left pleural effusion. Followup PA and lateral chest X-ray is recommended in 3-4 weeks following trial of antibiotic therapy  to ensure resolution. Electronically Signed   By: Obie Dredge M.D.   On: 12/28/2023 14:05     TODAY-DAY OF DISCHARGE:  Subjective:   Deborah Bryant today has no headache,no chest abdominal pain,no new weakness tingling or numbness, feels much better wants to go home today.   Objective:   Blood pressure (!) 164/73, pulse (!) 58, temperature (!) 97 F (36.1 C), temperature source Axillary, resp. rate 15, height 5\' 2"  (1.575 m), weight 81.8 kg, SpO2 97%.  Intake/Output Summary (Last 24 hours) at 01/03/2024 0901 Last data filed at 01/03/2024 9562 Gross per 24 hour  Intake 240 ml  Output 1200 ml  Net -960 ml   Filed Weights   01/01/24 0500 01/02/24 0557 01/03/24 0553  Weight: 83.6 kg 82.7 kg 81.8 kg    Exam: Awake Alert, Oriented *3, No new F.N deficits, Normal affect [place].AT,PERRAL Supple Neck,No JVD, No cervical lymphadenopathy appriciated.  Symmetrical Chest wall movement, Good air movement bilaterally, CTAB RRR,No Gallops,Rubs or new Murmurs, No Parasternal Heave +ve B.Sounds, Abd Soft, Non tender, No organomegaly appriciated, No rebound -guarding or rigidity. No Cyanosis, Clubbing or edema, No new Rash or bruise   PERTINENT RADIOLOGIC STUDIES: No results found.   PERTINENT LAB RESULTS: CBC: Recent Labs    01/03/24 0524  WBC 10.2  HGB 12.8  HCT 39.5  PLT 233   CMET CMP     Component Value Date/Time   NA 136 01/03/2024 0524   K 3.6 01/03/2024 0524   CL 95 (L) 01/03/2024 0524   CO2 30 01/03/2024 0524   GLUCOSE 93 01/03/2024 0524   BUN 17 01/03/2024 0524   CREATININE 0.84 01/03/2024 0524   CREATININE 0.87 08/25/2020 1047   CALCIUM 9.0 01/03/2024 0524   PROT 5.8 (L) 12/30/2023 0441   ALBUMIN 3.4 (L) 12/30/2023 0441   AST 62 (H) 12/30/2023 0441   ALT 40 12/30/2023 0441   ALKPHOS 122 12/30/2023 0441   BILITOT 0.8 12/30/2023 0441   GFR 72.37 08/19/2023 1145   EGFR 74.0 05/21/2023 1136   GFRNONAA >60 01/03/2024 0524    GFR Estimated Creatinine  Clearance: 55.6 mL/min (by C-G formula based on SCr of 0.84 mg/dL). No results for input(s): "LIPASE", "AMYLASE" in the last 72 hours. No results for input(s): "CKTOTAL", "CKMB", "CKMBINDEX", "TROPONINI" in the last 72 hours. Invalid input(s): "POCBNP" No results for input(s): "DDIMER" in the last 72 hours. No results  for input(s): "HGBA1C" in the last 72 hours. No results for input(s): "CHOL", "HDL", "LDLCALC", "TRIG", "CHOLHDL", "LDLDIRECT" in the last 72 hours. No results for input(s): "TSH", "T4TOTAL", "T3FREE", "THYROIDAB" in the last 72 hours.  Invalid input(s): "FREET3" No results for input(s): "VITAMINB12", "FOLATE", "FERRITIN", "TIBC", "IRON", "RETICCTPCT" in the last 72 hours. Coags: No results for input(s): "INR" in the last 72 hours.  Invalid input(s): "PT" Microbiology: Recent Results (from the past 240 hours)  Resp panel by RT-PCR (RSV, Flu A&B, Covid) Anterior Nasal Swab     Status: None   Collection Time: 12/28/23  1:59 PM   Specimen: Anterior Nasal Swab  Result Value Ref Range Status   SARS Coronavirus 2 by RT PCR NEGATIVE NEGATIVE Final   Influenza A by PCR NEGATIVE NEGATIVE Final   Influenza B by PCR NEGATIVE NEGATIVE Final    Comment: (NOTE) The Xpert Xpress SARS-CoV-2/FLU/RSV plus assay is intended as an aid in the diagnosis of influenza from Nasopharyngeal swab specimens and should not be used as a sole basis for treatment. Nasal washings and aspirates are unacceptable for Xpert Xpress SARS-CoV-2/FLU/RSV testing.  Fact Sheet for Patients: BloggerCourse.com  Fact Sheet for Healthcare Providers: SeriousBroker.it  This test is not yet approved or cleared by the [place] FDA and has been authorized for detection and/or diagnosis of SARS-CoV-2 by FDA under an Emergency Use Authorization (EUA). This EUA will remain in effect (meaning this test can be used) for the duration of the COVID-19 declaration under  Section 564(b)(1) of the Act, 21 U.S.C. section 360bbb-3(b)(1), unless the authorization is terminated or revoked.     Resp Syncytial Virus by PCR NEGATIVE NEGATIVE Final    Comment: (NOTE) Fact Sheet for Patients: BloggerCourse.com  Fact Sheet for Healthcare Providers: SeriousBroker.it  This test is not yet approved or cleared by the [place] FDA and has been authorized for detection and/or diagnosis of SARS-CoV-2 by FDA under an Emergency Use Authorization (EUA). This EUA will remain in effect (meaning this test can be used) for the duration of the COVID-19 declaration under Section 564(b)(1) of the Act, 21 U.S.C. section 360bbb-3(b)(1), unless the authorization is terminated or revoked.  Performed at [facility], [address], [place], [place] [address]   Respiratory (~20 pathogens) panel by PCR     Status: None   Collection Time: 12/28/23  1:59 PM   Specimen: Nasopharyngeal Swab; Respiratory  Result Value Ref Range Status   Adenovirus NOT DETECTED NOT DETECTED Final   Coronavirus 229E NOT DETECTED NOT DETECTED Final    Comment: (NOTE) The Coronavirus on the Respiratory Panel, DOES NOT test for the novel  Coronavirus (2019 nCoV)    Coronavirus HKU1 NOT DETECTED NOT DETECTED Final   Coronavirus NL63 NOT DETECTED NOT DETECTED Final   Coronavirus OC43 NOT DETECTED NOT DETECTED Final   Metapneumovirus NOT DETECTED NOT DETECTED Final   Rhinovirus / Enterovirus NOT DETECTED NOT DETECTED Final   Influenza A NOT DETECTED NOT DETECTED Final   Influenza B NOT DETECTED NOT DETECTED Final   Parainfluenza Virus 1 NOT DETECTED NOT DETECTED Final   Parainfluenza Virus 2 NOT DETECTED NOT DETECTED Final   Parainfluenza Virus 3 NOT DETECTED NOT DETECTED Final   Parainfluenza Virus 4 NOT DETECTED NOT DETECTED Final   Respiratory Syncytial Virus NOT DETECTED NOT DETECTED Final   Bordetella pertussis NOT DETECTED NOT DETECTED  Final   Bordetella Parapertussis NOT DETECTED NOT DETECTED Final   Chlamydophila pneumoniae NOT DETECTED NOT DETECTED Final  Mycoplasma pneumoniae NOT DETECTED NOT DETECTED Final    Comment: Performed at [facility], [address] [place], [place] [address]  Blood Culture (routine x 2)     Status: None   Collection Time: 12/28/23  2:16 PM   Specimen: BLOOD  Result Value Ref Range Status   Specimen Description BLOOD BLOOD RIGHT FOREARM  Final   Special Requests   Final    BOTTLES DRAWN AEROBIC AND ANAEROBIC Blood Culture results may not be optimal due to an inadequate volume of blood received in culture bottles   Culture   Final    NO GROWTH 5 DAYS Performed at [facility], [address], [place], [place] [address]    Report Status 01/02/2024 FINAL  Final  Blood Culture (routine x 2)     Status: None   Collection Time: 12/28/23  2:17 PM   Specimen: BLOOD  Result Value Ref Range Status   Specimen Description BLOOD BLOOD LEFT HAND  Final   Special Requests   Final    BOTTLES DRAWN AEROBIC AND ANAEROBIC Blood Culture results may not be optimal due to an inadequate volume of blood received in culture bottles   Culture   Final    NO GROWTH 5 DAYS Performed at [facility], [address] [place], [place] [address]    Report Status 01/02/2024 FINAL  Final    FURTHER DISCHARGE INSTRUCTIONS:  Get Medicines reviewed and adjusted: Please take all your medications with you for your next visit with your Primary MD  Laboratory/radiological data: Please request your Primary MD to go over all hospital tests and procedure/radiological results at the follow up, please ask your Primary MD to get all Hospital records sent to his/her office.  In some cases, they will be blood work, cultures and biopsy results pending at the time of your discharge. Please request that your primary care M.D. goes through all the records of your hospital data and follows up on these  results.  Also Note the following: If you experience worsening of your admission symptoms, develop shortness of breath, life threatening emergency, suicidal or homicidal thoughts you must seek medical attention immediately by calling 911 or calling your MD immediately  if symptoms less severe.  You must read complete instructions/literature along with all the possible adverse reactions/side effects for all the Medicines you take and that have been prescribed to you. Take any new Medicines after you have completely understood and accpet all the possible adverse reactions/side effects.   Do not drive when taking Pain medications or sleeping medications (Benzodaizepines)  Do not take more than prescribed Pain, Sleep and Anxiety Medications. It is not advisable to combine anxiety,sleep and pain medications without talking with your primary care practitioner  Special Instructions: If you have smoked or chewed Tobacco  in the last 2 yrs please stop smoking, stop any regular Alcohol  and or any Recreational drug use.  Wear Seat belts while driving.  Please note: You were cared for by a hospitalist during your hospital stay. Once you are discharged, your primary care physician will handle any further medical issues. Please note that NO REFILLS for any discharge medications will be authorized once you are discharged, as it is imperative that you return to your primary care physician (or establish a relationship with a primary care physician if you do not have one) for your post hospital discharge needs so that they can reassess your need for medications and monitor your lab  values.  Total Time spent coordinating discharge including counseling, education and face to face time equals greater than 30 minutes.  SignedJeoffrey Massed 01/03/2024 9:01 AM

## 2024-01-03 NOTE — TOC Progression Note (Signed)
Transition of Care [facility]) - Progression Note    Patient Details  Name: Deborah Bryant MRN: [id number] Date of Birth: 1946-03-26  Transition [facility]) CM/SW Contact  Dottie Vaquerano, [place], [place] Phone Number: 01/03/2024, 9:35 AM  Clinical Narrative:     Phone call to [place] admissions coordinator at [facility]. Left message to confirm patient's discharge there today.  Expected Discharge Plan: Skilled Nursing Facility    Expected Discharge Plan and Services In-house Referral: Clinical Social Work     Living arrangements for the past 2 months: Single Family Home Expected Discharge Date: 01/03/24                                     Social Determinants of Health (SDOH) Interventions SDOH Screenings   Food Insecurity: No Food Insecurity (12/28/2023)  Housing: Low Risk  (12/28/2023)  Transportation Needs: No Transportation Needs (12/28/2023)  Utilities: Not At Risk (12/28/2023)  Depression (PHQ2-9): Medium Risk (12/03/2023)  Social Connections: Moderately Isolated (12/28/2023)  Tobacco Use: High Risk (12/28/2023)    Readmission Risk Interventions     No data to display

## 2024-01-03 NOTE — TOC Transition Note (Addendum)
Transition of Care [facility]) - Discharge Note   Patient Details  Name: SAPNA FLORENCE MRN: [id number] Date of Birth: 1946/05/23  Transition of Care [facility]) CM/SW Contact:  Verna Czech [place], [place] Phone Number: 01/03/2024, 10:36 AM   Clinical Narrative:    Patient to discharge today to [facility] for rehab. Patient to be transported by [facility]. Patient will be going to room 407. RN to call report to [phone]. Patient's family member at bedside-informed of discharge plan.  Tailor Lucking, LCSW Transition of Care     Final next level of care: Skilled Nursing Facility     Patient Goals and CMS Choice     Choice offered to / list presented to : Patient      Discharge Placement                       Discharge Plan and Services Additional resources added to the After Visit Summary for   In-house Referral: Clinical Social Work                                   Social Drivers of Health (SDOH) Interventions SDOH Screenings   Food Insecurity: No Food Insecurity (12/28/2023)  Housing: Low Risk  (12/28/2023)  Transportation Needs: No Transportation Needs (12/28/2023)  Utilities: Not At Risk (12/28/2023)  Depression (PHQ2-9): Medium Risk (12/03/2023)  Social Connections: Moderately Isolated (12/28/2023)  Tobacco Use: High Risk (12/28/2023)     Readmission Risk Interventions     No data to display

## 2024-01-03 NOTE — Plan of Care (Signed)
  Problem: Education: Goal: Ability to describe self-care measures that may prevent or decrease complications (Diabetes Survival Skills Education) will improve Outcome: Progressing Goal: Individualized Educational Video(s) Outcome: Progressing   Problem: Coping: Goal: Ability to adjust to condition or change in health will improve Outcome: Progressing   Problem: Fluid Volume: Goal: Ability to maintain a balanced intake and output will improve Outcome: Progressing   Problem: Health Behavior/Discharge Planning: Goal: Ability to identify and utilize available resources and services will improve Outcome: Progressing Goal: Ability to manage health-related needs will improve Outcome: Progressing   Problem: Metabolic: Goal: Ability to maintain appropriate glucose levels will improve Outcome: Progressing   Problem: Nutritional: Goal: Maintenance of adequate nutrition will improve Outcome: Progressing Goal: Progress toward achieving an optimal weight will improve Outcome: Progressing   Problem: Skin Integrity: Goal: Risk for impaired skin integrity will decrease Outcome: Progressing   Problem: Tissue Perfusion: Goal: Adequacy of tissue perfusion will improve Outcome: Progressing   Problem: Education: Goal: Ability to demonstrate management of disease process will improve Outcome: Progressing Goal: Ability to verbalize understanding of medication therapies will improve Outcome: Progressing Goal: Individualized Educational Video(s) Outcome: Progressing   Problem: Activity: Goal: Capacity to carry out activities will improve Outcome: Progressing   Problem: Cardiac: Goal: Ability to achieve and maintain adequate cardiopulmonary perfusion will improve Outcome: Progressing   Problem: Education: Goal: Knowledge of disease or condition will improve Outcome: Progressing Goal: Knowledge of the prescribed therapeutic regimen will improve Outcome: Progressing Goal:  Individualized Educational Video(s) Outcome: Progressing   Problem: Activity: Goal: Ability to tolerate increased activity will improve Outcome: Progressing Goal: Will verbalize the importance of balancing activity with adequate rest periods Outcome: Progressing   Problem: Respiratory: Goal: Ability to maintain a clear airway will improve Outcome: Progressing Goal: Levels of oxygenation will improve Outcome: Progressing Goal: Ability to maintain adequate ventilation will improve Outcome: Progressing   Problem: Activity: Goal: Ability to tolerate increased activity will improve Outcome: Progressing   Problem: Clinical Measurements: Goal: Ability to maintain a body temperature in the normal range will improve Outcome: Progressing   Problem: Respiratory: Goal: Ability to maintain adequate ventilation will improve Outcome: Progressing Goal: Ability to maintain a clear airway will improve Outcome: Progressing   Problem: Education: Goal: Knowledge of General Education information will improve Description: Including pain rating scale, medication(s)/side effects and non-pharmacologic comfort measures Outcome: Progressing   Problem: Health Behavior/Discharge Planning: Goal: Ability to manage health-related needs will improve Outcome: Progressing   Problem: Clinical Measurements: Goal: Ability to maintain clinical measurements within normal limits will improve Outcome: Progressing Goal: Will remain free from infection Outcome: Progressing Goal: Diagnostic test results will improve Outcome: Progressing Goal: Respiratory complications will improve Outcome: Progressing Goal: Cardiovascular complication will be avoided Outcome: Progressing   Problem: Activity: Goal: Risk for activity intolerance will decrease Outcome: Progressing   Problem: Nutrition: Goal: Adequate nutrition will be maintained Outcome: Progressing   Problem: Coping: Goal: Level of anxiety will  decrease Outcome: Progressing   Problem: Elimination: Goal: Will not experience complications related to bowel motility Outcome: Progressing Goal: Will not experience complications related to urinary retention Outcome: Progressing   Problem: Pain Management: Goal: General experience of comfort will improve Outcome: Progressing   Problem: Safety: Goal: Ability to remain free from injury will improve Outcome: Progressing   Problem: Skin Integrity: Goal: Risk for impaired skin integrity will decrease Outcome: Progressing

## 2024-01-05 ENCOUNTER — Encounter: Payer: Self-pay | Admitting: Family Medicine

## 2024-01-05 DIAGNOSIS — E871 Hypo-osmolality and hyponatremia: Secondary | ICD-10-CM | POA: Diagnosis not present

## 2024-01-05 DIAGNOSIS — E559 Vitamin D deficiency, unspecified: Secondary | ICD-10-CM | POA: Diagnosis not present

## 2024-01-05 DIAGNOSIS — E785 Hyperlipidemia, unspecified: Secondary | ICD-10-CM | POA: Diagnosis not present

## 2024-01-05 DIAGNOSIS — D649 Anemia, unspecified: Secondary | ICD-10-CM | POA: Diagnosis not present

## 2024-01-06 NOTE — Telephone Encounter (Signed)
They will send her home with orders for the oxygen. After that I am not sure what will happen. If Pulmonology does not manage the oxygen, then I certainly will

## 2024-01-07 LAB — GLUCOSE, CAPILLARY: Glucose-Capillary: 123 mg/dL — ABNORMAL HIGH (ref 70–99)

## 2024-01-15 NOTE — Progress Notes (Signed)
 HPI: Follow-up respiratory failure, hypertension and chronic diastolic CHF. Nuclear study September 2024 showed ejection fraction 56% and no ischemia or infarction. Echocardiogram January 2025 showed normal LV function, grade 1 diastolic dysfunction, moderate pulmonary hypertension, mild left atrial enlargement, mild mitral regurgitation.  Patient admitted January 2025 with acute hypoxic respiratory failure felt secondary to a combination of pneumonia and CHF.  She was treated with antibiotics and diuretics with improvement.  She did have an acute metabolic encephalopathy which also improved.  Note Cardizem  was discontinued because of bradycardia by report.  Patient now returns for follow-up.  Since discharge she has dyspnea with more vigorous activities but not routine activities.  She is slowly regaining her strength.  She denies orthopnea, PND, pedal edema, chest pain or syncope.  Current Outpatient Medications  Medication Sig Dispense Refill   allopurinol  (ZYLOPRIM ) 300 MG tablet Take 1 tablet (300 mg total) by mouth daily. 90 tablet 3   Ascorbic Acid  (VITAMIN C ) 1000 MG tablet Take 1,000 mg by mouth daily.     aspirin  EC 81 MG tablet Take 81 mg by mouth daily.     atorvastatin  (LIPITOR) 40 MG tablet Take 1 tablet (40 mg total) by mouth daily. 90 tablet 3   Cholecalciferol  (VITAMIN D ) 2000 units CAPS Take 2,000 Units by mouth daily. OTC     diazepam  (VALIUM ) 5 MG tablet Take 1-2 tablets (5-10 mg total) by mouth every 8 (eight) hours as needed for muscle spasms. 20 tablet 0   glipiZIDE  (GLUCOTROL  XL) 5 MG 24 hr tablet Take 1 tablet (5 mg total) by mouth daily with breakfast. 90 tablet 3   HYDROcodone -acetaminophen  (NORCO) 5-325 MG tablet Take 1 tablet by mouth every 6 (six) hours as needed for moderate pain (pain score 4-6). 20 tablet 0   hydroxychloroquine  (PLAQUENIL ) 200 MG tablet Take 200 mg by mouth 2 (two) times daily. Pt reported changed by Rheumatology     LASIX  20 MG tablet Take 4  tablets (80 mg total) by mouth daily. Taking 2 tablets daily     polyethylene glycol powder (GLYCOLAX /MIRALAX ) powder Take 8.5-17 g by mouth at bedtime.      potassium chloride  (KLOR-CON  10) 10 MEQ tablet Take 2 tablets (20 mEq total) by mouth daily.     saccharomyces boulardii (FLORASTOR) 250 MG capsule Take 250 mg by mouth daily.     benzonatate  (TESSALON ) 200 MG capsule Take 1 capsule (200 mg total) by mouth 3 (three) times daily as needed for cough. (Patient not taking: Reported on 01/21/2024)     No current facility-administered medications for this visit.     Past Medical History:  Diagnosis Date   Allergic rhinitis    Anxiety    Arthritis    sees Dr. Jon Jacob    Cellulitis, periorbital 2006   left eye, due to MRSA, saw Dr. Arlana    Chronic kidney disease    sees Dr. Curtis Heman   Complication of anesthesia    COPD (chronic obstructive pulmonary disease) (HCC)    Cough    Diabetes mellitus    Dyspnea    with exertion   GERD (gastroesophageal reflux disease)    OTC  Pepcid    Hemorrhoids    Hyperlipidemia    Hypertension    IBS (irritable bowel syndrome)    Loss of smell    Loss of taste    Memory changes    Menopause    MRSA (methicillin resistant Staphylococcus aureus) 2006   left orbit  Pneumonia    PONV (postoperative nausea and vomiting)    Tubular adenoma of colon 12/2006    Past Surgical History:  Procedure Laterality Date   APPENDECTOMY     BACK SURGERY     COLONOSCOPY  04-01-12   per Dr. Aneita, tubular adenoma, repeat in 5 yrs    CYSTOSCOPY WITH BIOPSY N/A 12/11/2021   Procedure: CYSTOSCOPY WITH BIOPSY;  Surgeon: Elisabeth Valli BIRCH, MD;  Location: [facility];  Service: Urology;  Laterality: N/A;   CYSTOSCOPY WITH FULGERATION N/A 12/11/2021   Procedure: CYSTOSCOPY WITH FULGERATION;  Surgeon: Elisabeth Valli BIRCH, MD;  Location: [facility];  Service: Urology;  Laterality: N/A;   POLYPECTOMY     TUBAL LIGATION      Social History   Socioeconomic  History   Marital status: Married    Spouse name: Tom   Number of children: 1   Years of education: college   Highest education level: Bachelor's degree (e.g., BA, AB, BS)  Occupational History    Employer: RETIRED  Tobacco Use   Smoking status: Every Day    Current packs/day: 0.50    Average packs/day: 0.5 packs/day for 1 year (0.5 ttl pk-yrs)    Types: Cigarettes   Smokeless tobacco: Never  Vaping Use   Vaping status: Never Used  Substance and Sexual Activity   Alcohol use: Not Currently    Comment: noen sinc e2019   Drug use: Never   Sexual activity: Yes    Birth control/protection: Post-menopausal  Other Topics Concern   Not on file  Social History Narrative   Patient is right-handed.she lives with her husband in a one level home. She drinks decaf coffee, 2 cups a day. One cup iced teas per day. She goes to the gym 3 x a week and is doing P/T 1 x a week.     Social Drivers of Corporate Investment Banker Strain: Not on file  Food Insecurity: No Food Insecurity (12/28/2023)   Hunger Vital Sign    Worried About Running Out of Food in the Last Year: Never true    Ran Out of Food in the Last Year: Never true  Transportation Needs: No Transportation Needs (12/28/2023)   PRAPARE - Administrator, Civil Service (Medical): No    Lack of Transportation (Non-Medical): No  Physical Activity: Not on file  Stress: Not on file  Social Connections: Moderately Isolated (12/28/2023)   Social Connection and Isolation Panel [NHANES]    Frequency of Communication with Friends and Family: More than three times a week    Frequency of Social Gatherings with Friends and Family: Never    Attends Religious Services: Never    Database Administrator or Organizations: No    Attends Banker Meetings: Never    Marital Status: Married  Catering Manager Violence: Not At Risk (12/28/2023)   Humiliation, Afraid, Rape, and Kick questionnaire    Fear of Current or Ex-Partner: No     Emotionally Abused: No    Physically Abused: No    Sexually Abused: No    Family History  Problem Relation Age of Onset   Alcohol abuse Mother 40       MVA   Diabetes Mother    Heart disease Father 49       MI   Heart disease Brother    Diabetes Brother    Arthritis Sister    Dementia Maternal Grandfather     ROS: no fevers or chills,  productive cough, hemoptysis, dysphasia, odynophagia, melena, hematochezia, dysuria, hematuria, rash, seizure activity, orthopnea, PND, pedal edema, claudication. Remaining systems are negative.  Physical Exam: Well-developed well-nourished in no acute distress.  Skin is warm and dry.  HEENT is normal.  Neck is supple.  Chest is clear to auscultation with normal expansion.  Cardiovascular exam is regular rate and rhythm.  Abdominal exam nontender or distended. No masses palpated. Extremities show no edema. neuro grossly intact   A/P  1 chronic diastolic congestive heart failure-patient is euvolemic on examination.  Continue Lasix  at present dose.  Check BNP, potassium and renal function.  May need lower dose in the future.  2 hypertension-blood pressure elevated.  Add amlodipine  5 mg daily and follow.  Will avoid diltiazem  as apparently because of bradycardia in the hospital.  3 hyperlipidemia-continue statin.  Redell Shallow, MD

## 2024-01-19 ENCOUNTER — Telehealth: Payer: Self-pay

## 2024-01-19 DIAGNOSIS — M0600 Rheumatoid arthritis without rheumatoid factor, unspecified site: Secondary | ICD-10-CM | POA: Diagnosis not present

## 2024-01-19 DIAGNOSIS — I130 Hypertensive heart and chronic kidney disease with heart failure and stage 1 through stage 4 chronic kidney disease, or unspecified chronic kidney disease: Secondary | ICD-10-CM | POA: Diagnosis not present

## 2024-01-19 DIAGNOSIS — Z7982 Long term (current) use of aspirin: Secondary | ICD-10-CM | POA: Diagnosis not present

## 2024-01-19 DIAGNOSIS — M109 Gout, unspecified: Secondary | ICD-10-CM | POA: Diagnosis not present

## 2024-01-19 DIAGNOSIS — I700 Atherosclerosis of aorta: Secondary | ICD-10-CM | POA: Diagnosis not present

## 2024-01-19 DIAGNOSIS — R001 Bradycardia, unspecified: Secondary | ICD-10-CM | POA: Diagnosis not present

## 2024-01-19 DIAGNOSIS — E1122 Type 2 diabetes mellitus with diabetic chronic kidney disease: Secondary | ICD-10-CM | POA: Diagnosis not present

## 2024-01-19 DIAGNOSIS — I5033 Acute on chronic diastolic (congestive) heart failure: Secondary | ICD-10-CM | POA: Diagnosis not present

## 2024-01-19 DIAGNOSIS — I081 Rheumatic disorders of both mitral and tricuspid valves: Secondary | ICD-10-CM | POA: Diagnosis not present

## 2024-01-19 DIAGNOSIS — E66811 Obesity, class 1: Secondary | ICD-10-CM | POA: Diagnosis not present

## 2024-01-19 DIAGNOSIS — J188 Other pneumonia, unspecified organism: Secondary | ICD-10-CM | POA: Diagnosis not present

## 2024-01-19 DIAGNOSIS — Z7984 Long term (current) use of oral hypoglycemic drugs: Secondary | ICD-10-CM | POA: Diagnosis not present

## 2024-01-19 DIAGNOSIS — Z6831 Body mass index (BMI) 31.0-31.9, adult: Secondary | ICD-10-CM | POA: Diagnosis not present

## 2024-01-19 DIAGNOSIS — J440 Chronic obstructive pulmonary disease with acute lower respiratory infection: Secondary | ICD-10-CM | POA: Diagnosis not present

## 2024-01-19 DIAGNOSIS — N182 Chronic kidney disease, stage 2 (mild): Secondary | ICD-10-CM | POA: Diagnosis not present

## 2024-01-20 ENCOUNTER — Other Ambulatory Visit: Payer: Self-pay | Admitting: *Deleted

## 2024-01-20 NOTE — Patient Outreach (Signed)
 Late entry for 01/19/24. Mrs. Adney resides in [facility]. Screening for potential chronic care management services as a benefit of health plan and primary care provider.  Telephonic meeting with Deseree, SNF social worker. Deseree reports Mrs. Test slated to transition home on Saturday, Feb. 8th. Will have [facility] for PT/OT services. Lives with spouse. Will have DME -walker and home oxygen  supplied by [employer]. Deseree states Mrs. Oros also reportedly has supportive sister in law who is a engineer, civil (consulting).  No identifiable chronic care management needs at this time.    Pablo Hurst, MSN, RN, BSN [place]  [place], [facility] RN Post- Acute Care Manager Direct Dial: [phone]

## 2024-01-21 ENCOUNTER — Telehealth: Payer: Self-pay

## 2024-01-21 ENCOUNTER — Telehealth: Payer: Self-pay | Admitting: Cardiology

## 2024-01-21 ENCOUNTER — Encounter: Payer: Self-pay | Admitting: Cardiology

## 2024-01-21 ENCOUNTER — Ambulatory Visit: Payer: Medicare Other | Attending: Cardiology | Admitting: Cardiology

## 2024-01-21 VITALS — BP 150/78 | HR 79 | Ht 62.0 in | Wt 175.8 lb

## 2024-01-21 DIAGNOSIS — R600 Localized edema: Secondary | ICD-10-CM

## 2024-01-21 MED ORDER — AMLODIPINE BESYLATE 5 MG PO TABS: 5.0000 mg | ORAL_TABLET | Freq: Every day | ORAL | 3 refills | Status: DC

## 2024-01-21 NOTE — Telephone Encounter (Signed)
 Copied from CRM [id number]. Topic: Clinical - Home Health Verbal Orders >> Jan 21, 2024 11:16 AM Viola FALCON wrote: Caller/Agency: [facility]  Callback Number: [phone] Service Requested: Occupational Therapy Frequency:  Any new concerns about the patient? No, delay Occupational Ttherapy evaluation until next week,. Week of 2/10

## 2024-01-21 NOTE — Telephone Encounter (Signed)
 Liji, a PT, called to give verbal orders for patients. Home Health  2 times for 2 weeks 1 time for 5 weeks for balance training and strength training

## 2024-01-21 NOTE — Telephone Encounter (Signed)
 Noted.

## 2024-01-21 NOTE — Telephone Encounter (Signed)
 Discussed at office visit.

## 2024-01-21 NOTE — Telephone Encounter (Signed)
Please advise if okay to give verbal orders for PT:  Liji, a PT, called to give verbal orders for patients. Home Health  2 times for 2 weeks 1 time for 5 weeks for balance training and strength training

## 2024-01-21 NOTE — Patient Instructions (Signed)
 Medication Instructions:   START AMLODIPINE  5 MG ONCE DAILY  *If you need a refill on your cardiac medications before your next appointment, please call your pharmacy*   Follow-Up: At [facility], you and your health needs are our priority.  As part of our continuing mission to provide you with exceptional heart care, we have created designated Provider Care Teams.  These Care Teams include your primary Cardiologist (physician) and Advanced Practice Providers (APPs -  Physician Assistants and Nurse Practitioners) who all work together to provide you with the care you need, when you need it.  We recommend signing up for the patient portal called MyChart.  Sign up information is provided on this After Visit Summary.  MyChart is used to connect with patients for Virtual Visits (Telemedicine).  Patients are able to view lab/test results, encounter notes, upcoming appointments, etc.  Non-urgent messages can be sent to your provider as well.   To learn more about what you can do with MyChart, go to [email or web address].    Your next appointment:   4 month(s)  Provider:   Redell Shallow, MD

## 2024-01-22 ENCOUNTER — Encounter (HOSPITAL_BASED_OUTPATIENT_CLINIC_OR_DEPARTMENT_OTHER): Payer: Self-pay | Admitting: *Deleted

## 2024-01-22 ENCOUNTER — Telehealth: Payer: Self-pay | Admitting: Cardiology

## 2024-01-22 LAB — BASIC METABOLIC PANEL
BUN/Creatinine Ratio: 15 (ref 12–28)
BUN: 16 mg/dL (ref 8–27)
CO2: 19 mmol/L — ABNORMAL LOW (ref 20–29)
Calcium: 9.6 mg/dL (ref 8.7–10.3)
Chloride: 100 mmol/L (ref 96–106)
Creatinine, Ser: 1.09 mg/dL — ABNORMAL HIGH (ref 0.57–1.00)
Glucose: 112 mg/dL — ABNORMAL HIGH (ref 70–99)
Potassium: 4.3 mmol/L (ref 3.5–5.2)
Sodium: 139 mmol/L (ref 134–144)
eGFR: 52 mL/min/{1.73_m2} — ABNORMAL LOW (ref >59)

## 2024-01-22 LAB — PRO B NATRIURETIC PEPTIDE: NT-Pro BNP: 2141 pg/mL — ABNORMAL HIGH (ref 0–738)

## 2024-01-22 NOTE — Telephone Encounter (Signed)
 Caller (Liji) called again to follow-up to get verbal orders for patient home health services.

## 2024-01-23 ENCOUNTER — Telehealth: Payer: Self-pay

## 2024-01-23 DIAGNOSIS — I5033 Acute on chronic diastolic (congestive) heart failure: Secondary | ICD-10-CM

## 2024-01-23 NOTE — Telephone Encounter (Signed)
 Copied from CRM [id number]. Topic: General - Other >> Jan 23, 2024 10:39 AM Benton KIDD wrote: Reason for CRM: liji/[facility] Callback Number: [phone] Placed a home health order on Monday for pt and there where issues concerning the patient but patient just called and everything is good . Ms paralee is requesting to speak with the nurse concerning the patient home health orders

## 2024-01-23 NOTE — Telephone Encounter (Signed)
 Please call in orders for the PT

## 2024-01-23 NOTE — Telephone Encounter (Signed)
 Copied from CRM [id number]. Topic: Referral - Question >> Jan 23, 2024 10:19 AM Deborah Bryant wrote: Reason for CRM: Patient called in saying that she was hospitalized for heart failure. They recommended her to have physical therapy, patient is requesting a referral from Dr. Johnny. Patient states that her home health aid has been calling trying to request a referral.

## 2024-01-26 NOTE — Addendum Note (Signed)
 Addended by: Shakiyla Kook on: 01/26/2024 12:04 PM   Modules accepted: Orders

## 2024-01-26 NOTE — Addendum Note (Signed)
 Addended by: Milia Warth on: 01/26/2024 11:50 AM   Modules accepted: Orders

## 2024-01-26 NOTE — Telephone Encounter (Signed)
 Spoke to Liji, she got the orders from her medical doctor.

## 2024-01-26 NOTE — Telephone Encounter (Signed)
 Liji is calling checking on the status of order

## 2024-01-26 NOTE — Telephone Encounter (Signed)
 Spoke to Liji and gave her verbal order for PT. Please see other encounter.

## 2024-01-27 ENCOUNTER — Ambulatory Visit: Payer: Medicare Other | Admitting: Nurse Practitioner

## 2024-01-28 DIAGNOSIS — J440 Chronic obstructive pulmonary disease with acute lower respiratory infection: Secondary | ICD-10-CM | POA: Diagnosis not present

## 2024-01-28 DIAGNOSIS — E1122 Type 2 diabetes mellitus with diabetic chronic kidney disease: Secondary | ICD-10-CM | POA: Diagnosis not present

## 2024-01-28 DIAGNOSIS — N182 Chronic kidney disease, stage 2 (mild): Secondary | ICD-10-CM | POA: Diagnosis not present

## 2024-01-28 DIAGNOSIS — M0600 Rheumatoid arthritis without rheumatoid factor, unspecified site: Secondary | ICD-10-CM | POA: Diagnosis not present

## 2024-01-28 DIAGNOSIS — I130 Hypertensive heart and chronic kidney disease with heart failure and stage 1 through stage 4 chronic kidney disease, or unspecified chronic kidney disease: Secondary | ICD-10-CM | POA: Diagnosis not present

## 2024-01-28 DIAGNOSIS — I5033 Acute on chronic diastolic (congestive) heart failure: Secondary | ICD-10-CM | POA: Diagnosis not present

## 2024-01-30 DIAGNOSIS — I5033 Acute on chronic diastolic (congestive) heart failure: Secondary | ICD-10-CM | POA: Diagnosis not present

## 2024-01-30 DIAGNOSIS — J440 Chronic obstructive pulmonary disease with acute lower respiratory infection: Secondary | ICD-10-CM | POA: Diagnosis not present

## 2024-01-30 DIAGNOSIS — M0600 Rheumatoid arthritis without rheumatoid factor, unspecified site: Secondary | ICD-10-CM | POA: Diagnosis not present

## 2024-01-30 DIAGNOSIS — I130 Hypertensive heart and chronic kidney disease with heart failure and stage 1 through stage 4 chronic kidney disease, or unspecified chronic kidney disease: Secondary | ICD-10-CM | POA: Diagnosis not present

## 2024-01-30 DIAGNOSIS — E1122 Type 2 diabetes mellitus with diabetic chronic kidney disease: Secondary | ICD-10-CM | POA: Diagnosis not present

## 2024-01-30 DIAGNOSIS — N182 Chronic kidney disease, stage 2 (mild): Secondary | ICD-10-CM | POA: Diagnosis not present

## 2024-02-02 DIAGNOSIS — N182 Chronic kidney disease, stage 2 (mild): Secondary | ICD-10-CM | POA: Diagnosis not present

## 2024-02-02 DIAGNOSIS — J440 Chronic obstructive pulmonary disease with acute lower respiratory infection: Secondary | ICD-10-CM | POA: Diagnosis not present

## 2024-02-02 DIAGNOSIS — I5033 Acute on chronic diastolic (congestive) heart failure: Secondary | ICD-10-CM | POA: Diagnosis not present

## 2024-02-02 DIAGNOSIS — I130 Hypertensive heart and chronic kidney disease with heart failure and stage 1 through stage 4 chronic kidney disease, or unspecified chronic kidney disease: Secondary | ICD-10-CM | POA: Diagnosis not present

## 2024-02-02 DIAGNOSIS — M0600 Rheumatoid arthritis without rheumatoid factor, unspecified site: Secondary | ICD-10-CM | POA: Diagnosis not present

## 2024-02-02 DIAGNOSIS — E1122 Type 2 diabetes mellitus with diabetic chronic kidney disease: Secondary | ICD-10-CM | POA: Diagnosis not present

## 2024-02-04 DIAGNOSIS — J440 Chronic obstructive pulmonary disease with acute lower respiratory infection: Secondary | ICD-10-CM | POA: Diagnosis not present

## 2024-02-04 DIAGNOSIS — N182 Chronic kidney disease, stage 2 (mild): Secondary | ICD-10-CM | POA: Diagnosis not present

## 2024-02-04 DIAGNOSIS — E1122 Type 2 diabetes mellitus with diabetic chronic kidney disease: Secondary | ICD-10-CM | POA: Diagnosis not present

## 2024-02-04 DIAGNOSIS — I130 Hypertensive heart and chronic kidney disease with heart failure and stage 1 through stage 4 chronic kidney disease, or unspecified chronic kidney disease: Secondary | ICD-10-CM | POA: Diagnosis not present

## 2024-02-04 DIAGNOSIS — I5033 Acute on chronic diastolic (congestive) heart failure: Secondary | ICD-10-CM | POA: Diagnosis not present

## 2024-02-04 DIAGNOSIS — M0600 Rheumatoid arthritis without rheumatoid factor, unspecified site: Secondary | ICD-10-CM | POA: Diagnosis not present

## 2024-02-10 DIAGNOSIS — R319 Hematuria, unspecified: Secondary | ICD-10-CM | POA: Diagnosis not present

## 2024-02-10 DIAGNOSIS — Z6831 Body mass index (BMI) 31.0-31.9, adult: Secondary | ICD-10-CM | POA: Diagnosis not present

## 2024-02-10 DIAGNOSIS — N182 Chronic kidney disease, stage 2 (mild): Secondary | ICD-10-CM | POA: Diagnosis not present

## 2024-02-10 DIAGNOSIS — R432 Parageusia: Secondary | ICD-10-CM | POA: Diagnosis not present

## 2024-02-10 DIAGNOSIS — I129 Hypertensive chronic kidney disease with stage 1 through stage 4 chronic kidney disease, or unspecified chronic kidney disease: Secondary | ICD-10-CM | POA: Diagnosis not present

## 2024-02-10 DIAGNOSIS — Z720 Tobacco use: Secondary | ICD-10-CM | POA: Diagnosis not present

## 2024-02-11 DIAGNOSIS — E1122 Type 2 diabetes mellitus with diabetic chronic kidney disease: Secondary | ICD-10-CM | POA: Diagnosis not present

## 2024-02-11 DIAGNOSIS — J440 Chronic obstructive pulmonary disease with acute lower respiratory infection: Secondary | ICD-10-CM | POA: Diagnosis not present

## 2024-02-11 DIAGNOSIS — M0600 Rheumatoid arthritis without rheumatoid factor, unspecified site: Secondary | ICD-10-CM | POA: Diagnosis not present

## 2024-02-11 DIAGNOSIS — N182 Chronic kidney disease, stage 2 (mild): Secondary | ICD-10-CM | POA: Diagnosis not present

## 2024-02-11 DIAGNOSIS — I130 Hypertensive heart and chronic kidney disease with heart failure and stage 1 through stage 4 chronic kidney disease, or unspecified chronic kidney disease: Secondary | ICD-10-CM | POA: Diagnosis not present

## 2024-02-11 DIAGNOSIS — I5033 Acute on chronic diastolic (congestive) heart failure: Secondary | ICD-10-CM | POA: Diagnosis not present

## 2024-02-13 ENCOUNTER — Ambulatory Visit: Payer: Medicare Other | Admitting: Family Medicine

## 2024-02-13 ENCOUNTER — Ambulatory Visit: Payer: Medicare Other

## 2024-02-13 ENCOUNTER — Encounter: Payer: Self-pay | Admitting: Family Medicine

## 2024-02-13 VITALS — BP 130/60 | HR 71 | Temp 97.4°F | Wt 170.8 lb

## 2024-02-13 DIAGNOSIS — J189 Pneumonia, unspecified organism: Secondary | ICD-10-CM

## 2024-02-13 DIAGNOSIS — N170 Acute kidney failure with tubular necrosis: Secondary | ICD-10-CM | POA: Diagnosis not present

## 2024-02-13 DIAGNOSIS — N182 Chronic kidney disease, stage 2 (mild): Secondary | ICD-10-CM

## 2024-02-13 DIAGNOSIS — N950 Postmenopausal bleeding: Secondary | ICD-10-CM

## 2024-02-13 DIAGNOSIS — M47814 Spondylosis without myelopathy or radiculopathy, thoracic region: Secondary | ICD-10-CM | POA: Diagnosis not present

## 2024-02-13 DIAGNOSIS — J449 Chronic obstructive pulmonary disease, unspecified: Secondary | ICD-10-CM | POA: Insufficient documentation

## 2024-02-13 NOTE — Progress Notes (Signed)
   Subjective:    Patient ID: Deborah Bryant, female    DOB: 06-08-1946, 78 y.o.   MRN: [id number]  HPI Here to follow up on a hospital stay from 12-28-23 to 01-03-24 for acute hypoxic respiratory failure due to acute on chronic diastolic CHF and a left lower pneumonia. She also had acute renal failure. She presented with SOB and swelling. Her BNP was 2141. Her initial creatinine was 1.20 and this was down to 1.09 by DC. Her ECHO showed an EF of 60-65% with normal wall motion and Grade I diastolic dysfunction. A CXR revealed a LLL pneumonia as well as a small left lower effusion. She was treated with IV Lasix and antibiotics, and these were switched to oral meds. She was sent home on Augmentin and Lasix 80 mg daily. All cultures remained negative. In addition to all this she mentioned to them that she has had slight vaginal bleeding intermittently for several months. We had discussed this in our office and she was referred to Urology, who did an [place] and a cystoscopy. No source of bleeding was found. After her DC from the hospital she stayed at [facility] for rehab until she went home on 01-17-24. She saw her cardiologist, Dr. Jens Som, on 01-21-24, and he stopped the Diltiazem and started her on Amlodipine 5 mg daily. He also decreased the Lasix to 60 mg daily. She saw her nephrologist, Dr. Ervin Knack, on 02-10-24, and she was satisfied with her renal function since her creatinine and GFR had settled down to her baseline. Today she feels fatigued and she gets mildly SOB on exertion, but there is no chest pain or fever.    Review of Systems  Constitutional:  Positive for fatigue. Negative for fever.  Respiratory:  Positive for cough and shortness of breath. Negative for wheezing.   Cardiovascular: Negative.   Gastrointestinal: Negative.   Neurological: Negative.        Objective:   Physical Exam Constitutional:      Appearance: Normal appearance.  Cardiovascular:     Rate and Rhythm: Normal rate and  regular rhythm.     Pulses: Normal pulses.     Heart sounds: Normal heart sounds.  Pulmonary:     Effort: Pulmonary effort is normal.     Breath sounds: No wheezing, rhonchi or rales.     Comments: BS are absent in the left base  Musculoskeletal:     Comments: 1+ edema in both ankles   Neurological:     Mental Status: She is alert.           Assessment & Plan:  She is recovering from a LLL pneumonia with a pleural effusion, and she is doing as well as we could expect. We will get another CXR today. We will refer her to Pulmonology since she also has a hx of COPD. Her CHF has settled down. Her acute renal failure has reversed, and we will check another BMET in 2 months. We will refer her to GYN for the vaginal  bleeding. We spent a total of ( 35  ) minutes reviewing records and discussing these issues.  Gershon Crane, MD

## 2024-02-18 DIAGNOSIS — E66811 Obesity, class 1: Secondary | ICD-10-CM | POA: Diagnosis not present

## 2024-02-18 DIAGNOSIS — I700 Atherosclerosis of aorta: Secondary | ICD-10-CM | POA: Diagnosis not present

## 2024-02-18 DIAGNOSIS — M0600 Rheumatoid arthritis without rheumatoid factor, unspecified site: Secondary | ICD-10-CM | POA: Diagnosis not present

## 2024-02-18 DIAGNOSIS — Z7982 Long term (current) use of aspirin: Secondary | ICD-10-CM | POA: Diagnosis not present

## 2024-02-18 DIAGNOSIS — R001 Bradycardia, unspecified: Secondary | ICD-10-CM | POA: Diagnosis not present

## 2024-02-18 DIAGNOSIS — Z6831 Body mass index (BMI) 31.0-31.9, adult: Secondary | ICD-10-CM | POA: Diagnosis not present

## 2024-02-18 DIAGNOSIS — Z7984 Long term (current) use of oral hypoglycemic drugs: Secondary | ICD-10-CM | POA: Diagnosis not present

## 2024-02-18 DIAGNOSIS — I130 Hypertensive heart and chronic kidney disease with heart failure and stage 1 through stage 4 chronic kidney disease, or unspecified chronic kidney disease: Secondary | ICD-10-CM | POA: Diagnosis not present

## 2024-02-18 DIAGNOSIS — J188 Other pneumonia, unspecified organism: Secondary | ICD-10-CM | POA: Diagnosis not present

## 2024-02-18 DIAGNOSIS — M109 Gout, unspecified: Secondary | ICD-10-CM | POA: Diagnosis not present

## 2024-02-18 DIAGNOSIS — E1122 Type 2 diabetes mellitus with diabetic chronic kidney disease: Secondary | ICD-10-CM | POA: Diagnosis not present

## 2024-02-18 DIAGNOSIS — I5033 Acute on chronic diastolic (congestive) heart failure: Secondary | ICD-10-CM | POA: Diagnosis not present

## 2024-02-18 DIAGNOSIS — N182 Chronic kidney disease, stage 2 (mild): Secondary | ICD-10-CM | POA: Diagnosis not present

## 2024-02-18 DIAGNOSIS — I081 Rheumatic disorders of both mitral and tricuspid valves: Secondary | ICD-10-CM | POA: Diagnosis not present

## 2024-02-18 DIAGNOSIS — J440 Chronic obstructive pulmonary disease with acute lower respiratory infection: Secondary | ICD-10-CM | POA: Diagnosis not present

## 2024-02-26 DIAGNOSIS — I5033 Acute on chronic diastolic (congestive) heart failure: Secondary | ICD-10-CM | POA: Diagnosis not present

## 2024-02-26 DIAGNOSIS — J440 Chronic obstructive pulmonary disease with acute lower respiratory infection: Secondary | ICD-10-CM | POA: Diagnosis not present

## 2024-02-26 DIAGNOSIS — E1122 Type 2 diabetes mellitus with diabetic chronic kidney disease: Secondary | ICD-10-CM | POA: Diagnosis not present

## 2024-02-26 DIAGNOSIS — M0600 Rheumatoid arthritis without rheumatoid factor, unspecified site: Secondary | ICD-10-CM | POA: Diagnosis not present

## 2024-02-26 DIAGNOSIS — N182 Chronic kidney disease, stage 2 (mild): Secondary | ICD-10-CM | POA: Diagnosis not present

## 2024-02-26 DIAGNOSIS — I130 Hypertensive heart and chronic kidney disease with heart failure and stage 1 through stage 4 chronic kidney disease, or unspecified chronic kidney disease: Secondary | ICD-10-CM | POA: Diagnosis not present

## 2024-03-04 DIAGNOSIS — N182 Chronic kidney disease, stage 2 (mild): Secondary | ICD-10-CM | POA: Diagnosis not present

## 2024-03-04 DIAGNOSIS — I5033 Acute on chronic diastolic (congestive) heart failure: Secondary | ICD-10-CM | POA: Diagnosis not present

## 2024-03-04 DIAGNOSIS — I130 Hypertensive heart and chronic kidney disease with heart failure and stage 1 through stage 4 chronic kidney disease, or unspecified chronic kidney disease: Secondary | ICD-10-CM | POA: Diagnosis not present

## 2024-03-04 DIAGNOSIS — J440 Chronic obstructive pulmonary disease with acute lower respiratory infection: Secondary | ICD-10-CM | POA: Diagnosis not present

## 2024-03-04 DIAGNOSIS — E1122 Type 2 diabetes mellitus with diabetic chronic kidney disease: Secondary | ICD-10-CM | POA: Diagnosis not present

## 2024-03-04 DIAGNOSIS — M0600 Rheumatoid arthritis without rheumatoid factor, unspecified site: Secondary | ICD-10-CM | POA: Diagnosis not present

## 2024-03-09 ENCOUNTER — Telehealth: Payer: Self-pay

## 2024-03-09 NOTE — Telephone Encounter (Signed)
 Copied from CRM [id number]. Topic: General - Other >> Mar 05, 2024  2:56 PM Isabelle Course C wrote: Reason for CRM: Trish from [facility] wants to extend PT for patient 1x a week for 2 week and 2x a week for 4 weeks. [phone] .... Ok to leave message if she does not answer >> Mar 09, 2024 10:22 AM Sim Boast F wrote: Original message from 03/05/24 was routed incorrectly. Patient called to follow up on this since she has an appointment tomorrow with [facility] 03/10/24. Please advise.

## 2024-03-09 NOTE — Telephone Encounter (Signed)
 FYI Spoke with Trish from [facility] advised that Dr Clent Ridges approved VO as requested

## 2024-03-10 DIAGNOSIS — I5033 Acute on chronic diastolic (congestive) heart failure: Secondary | ICD-10-CM | POA: Diagnosis not present

## 2024-03-10 DIAGNOSIS — N182 Chronic kidney disease, stage 2 (mild): Secondary | ICD-10-CM | POA: Diagnosis not present

## 2024-03-10 DIAGNOSIS — J440 Chronic obstructive pulmonary disease with acute lower respiratory infection: Secondary | ICD-10-CM | POA: Diagnosis not present

## 2024-03-10 DIAGNOSIS — M0600 Rheumatoid arthritis without rheumatoid factor, unspecified site: Secondary | ICD-10-CM | POA: Diagnosis not present

## 2024-03-10 DIAGNOSIS — I130 Hypertensive heart and chronic kidney disease with heart failure and stage 1 through stage 4 chronic kidney disease, or unspecified chronic kidney disease: Secondary | ICD-10-CM | POA: Diagnosis not present

## 2024-03-10 DIAGNOSIS — E1122 Type 2 diabetes mellitus with diabetic chronic kidney disease: Secondary | ICD-10-CM | POA: Diagnosis not present

## 2024-03-10 NOTE — Telephone Encounter (Signed)
error 

## 2024-03-12 ENCOUNTER — Other Ambulatory Visit (HOSPITAL_COMMUNITY)
Admission: RE | Admit: 2024-03-12 | Discharge: 2024-03-12 | Disposition: A | Discharge status: Home / Self Care | Source: Ambulatory Visit | Attending: Obstetrics and Gynecology | Admitting: Obstetrics and Gynecology

## 2024-03-12 ENCOUNTER — Encounter: Payer: Self-pay | Admitting: Obstetrics and Gynecology

## 2024-03-12 ENCOUNTER — Ambulatory Visit: Admitting: Obstetrics and Gynecology

## 2024-03-12 VITALS — BP 122/57 | HR 64 | Ht 62.0 in | Wt 171.0 lb

## 2024-03-12 DIAGNOSIS — Z124 Encounter for screening for malignant neoplasm of cervix: Secondary | ICD-10-CM | POA: Insufficient documentation

## 2024-03-12 DIAGNOSIS — N950 Postmenopausal bleeding: Secondary | ICD-10-CM | POA: Insufficient documentation

## 2024-03-12 DIAGNOSIS — N858 Other specified noninflammatory disorders of uterus: Secondary | ICD-10-CM | POA: Diagnosis not present

## 2024-03-12 DIAGNOSIS — Z1151 Encounter for screening for human papillomavirus (HPV): Secondary | ICD-10-CM | POA: Diagnosis not present

## 2024-03-12 NOTE — Progress Notes (Signed)
 NEW GYNECOLOGY PATIENT Patient name: Deborah Bryant MRN [id number]  Date of birth: 1946-02-04 Chief Complaint:   Vaginal Bleeding     History:  Deborah Bryant is a 79 y.o. G1P1001 being seen today for PMB.  Discussed the use of AI scribe software for clinical note transcription with the patient, who gave verbal consent to proceed.  History of Present Illness Deborah Bryant is a 78 year old female who presents with postmenopausal bleeding.  She has been experiencing intermittent bright red vaginal bleeding for the past couple of years, with recent episodes being heavy enough to require frequent pad changes. The bleeding is not constant, and she is unsure of the triggers. There is no associated pain, only the sensation of blood flow. She has not had regular menstrual cycles since her early fifties and has not experienced any bleeding for at least a decade until the recent episodes. She is not currently sexually active due to her husband's prostate cancer. She has not had any abnormal Pap smears in the past and has not had a Pap smear in a while.  She has a history of smoking and quit last year. Her father had bladder cancer, which initially concerned her when she noticed the bleeding. She underwent evaluation by a urologist who found inflammation on the bladder walls but no tumors. A biopsy showed no significant findings. 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  She has a history of fluid retention, which led to pneumonia and congestive heart failure in January, requiring hospitalization for six days and subsequent rehabilitation for two weeks. She continues to do physical therapy.           Gynecologic History No LMP recorded. Patient is postmenopausal. Contraception: post menopausal status Last Pap: non recently Last Mammogram:  10/2023 BIRADS 1 Last Colonoscopy:  2013  Obstetric History OB  History  Gravida Para Term Preterm AB Living  1 1 1   1   SAB IAB Ectopic Multiple Live Births      1    # Outcome Date GA Lbr Len/2nd Weight Sex Type Anes PTL Lv  1 Term 40 [redacted]w[redacted]d   F Vag-Spont None N LIV    Past Medical History:  Diagnosis Date   Allergic rhinitis    Anxiety    Arthritis    sees Dr. Zenovia Jordan    Cellulitis, periorbital 2006   left eye, due to MRSA, saw Dr. Lazarus Salines    Chronic kidney disease    sees Dr. Annie Sable   Complication of anesthesia    COPD (chronic obstructive pulmonary disease) (HCC)    Cough    Diabetes mellitus    Dyspnea    with exertion   GERD (gastroesophageal reflux disease)    OTC  Pepcid   Hemorrhoids    Hyperlipidemia    Hypertension    IBS (irritable bowel syndrome)    Loss of smell    Loss of taste    Memory changes    Menopause    MRSA (methicillin resistant Staphylococcus aureus) 2006   left orbit    Pneumonia    PONV (postoperative nausea and vomiting)    Tubular adenoma of colon 12/2006    Past Surgical History:  Procedure Laterality Date   APPENDECTOMY     BACK SURGERY     COLONOSCOPY  04-01-12   per Dr. Russella Dar, tubular adenoma, repeat in 5 yrs  CYSTOSCOPY WITH BIOPSY N/A 12/11/2021   Procedure: CYSTOSCOPY WITH BIOPSY;  Surgeon: Noel Christmas, MD;  Location: [facility];  Service: Urology;  Laterality: N/A;   CYSTOSCOPY WITH FULGERATION N/A 12/11/2021   Procedure: CYSTOSCOPY WITH FULGERATION;  Surgeon: Noel Christmas, MD;  Location: [facility];  Service: Urology;  Laterality: N/A;   POLYPECTOMY     TUBAL LIGATION      Current Outpatient Medications on File Prior to Visit  Medication Sig Dispense Refill   allopurinol (ZYLOPRIM) 300 MG tablet Take 1 tablet (300 mg total) by mouth daily. 90 tablet 3   amLODipine (NORVASC) 5 MG tablet Take 1 tablet (5 mg total) by mouth daily. 90 tablet 3   Ascorbic Acid (VITAMIN C) 1000 MG tablet Take 1,000 mg by mouth daily.     aspirin EC 81 MG tablet Take 81 mg by  mouth daily.     atorvastatin (LIPITOR) 40 MG tablet Take 1 tablet (40 mg total) by mouth daily. 90 tablet 3   Cholecalciferol (VITAMIN D) 2000 units CAPS Take 2,000 Units by mouth daily. OTC     diazepam (VALIUM) 5 MG tablet Take 1-2 tablets (5-10 mg total) by mouth every 8 (eight) hours as needed for muscle spasms. 20 tablet 0   glipiZIDE (GLUCOTROL XL) 5 MG 24 hr tablet Take 1 tablet (5 mg total) by mouth daily with breakfast. 90 tablet 3   HYDROcodone-acetaminophen (NORCO) 5-325 MG tablet Take 1 tablet by mouth every 6 (six) hours as needed for moderate pain (pain score 4-6). 20 tablet 0   hydroxychloroquine (PLAQUENIL) 200 MG tablet Take 200 mg by mouth 2 (two) times daily. Pt reported changed by Rheumatology     LASIX 20 MG tablet Take 4 tablets (80 mg total) by mouth daily. Taking 2 tablets daily     polyethylene glycol powder (GLYCOLAX/MIRALAX) powder Take 8.5-17 g by mouth at bedtime.      potassium chloride (KLOR-CON 10) 10 MEQ tablet Take 2 tablets (20 mEq total) by mouth daily.     saccharomyces boulardii (FLORASTOR) 250 MG capsule Take 250 mg by mouth daily.     No current facility-administered medications on file prior to visit.    Allergies  Allergen Reactions   Losartan Shortness Of Breath   Benazepril Cough   Lasix [Furosemide] Other (See Comments)    Rash, changes in eyesight   Levaquin [Levofloxacin] Other (See Comments)    Tendon and muscle pain   Phenobarbital Nausea And Vomiting    Social History:  reports that she has been smoking cigarettes. She has a 0.5 pack-year smoking history. She has never used smokeless tobacco. She reports that she does not currently use alcohol. She reports that she does not use drugs.  Family History  Problem Relation Age of Onset   Alcohol abuse Mother 73       MVA   Diabetes Mother    Heart disease Father 80       MI   Heart disease Brother    Diabetes Brother    Arthritis Sister    Dementia Maternal Grandfather     The  following portions of the patient's history were reviewed and updated as appropriate: allergies, current medications, past family history, past medical history, past social history, past surgical history and problem list.  Review of Systems Pertinent items noted in HPI and remainder of comprehensive ROS otherwise negative.  Physical Exam:  BP (!) 122/57 (BP Location: Left Arm, Patient Position: Sitting, Cuff Size: Large)  Pulse 64   Ht 5\' 2"  (1.575 m)   Wt 171 lb (77.6 kg)   BMI 31.28 kg/m  Physical Exam Vitals and nursing note reviewed. Exam conducted with a chaperone present.  Constitutional:      Appearance: Normal appearance.  Pulmonary:     Effort: Pulmonary effort is normal.  Genitourinary:    General: Normal vulva.     Exam position: Lithotomy position.     Comments: Atrophic cervix Large polyp emanating through endocervical canal, ~2.5cm in length visible Neurological:     Mental Status: She is alert.    Endometrial Biopsy Procedure  Patient identified, informed consent performed,  indication reviewed, consent signed.  Reviewed risk of perforation, pain, bleeding, insufficient sample, etc were reviewd. Time out was performed.  Urine pregnancy test negative.  Speculum placed in the vagina.  Cervix visualized.  Cleaned with Betadine x 2.    Paracervical block was not administered.  Endometrial pipelle was used to draw up 1cc of 1% lidocaine, introduced into the cervical os and instilled into the endometrial cavity.  The pipelle was passed twice without difficulty and sample obtained. Tenaculum was removed, good hemostasis noted.  Patient tolerated procedure well.  Patient was given post-procedure instructions.      Assessment and Plan:   Assessment & Plan Postmenopausal bleeding Intermittent bright red vaginal bleeding for years, recently heavy. Differential includes endometrial cancer, polyps, or uterine pathology. Previous CT scan did not show uterine  abnormalities. - Now s/p endometrial biopsy - Order pelvic ultrasound to evaluate uterus. - Recommend polypectomy, preferably with hysteroscope to ensure entire polyp removed and any other polyps present are removed - Patient prefers to not be under any anesthetic, so will proceed with office hysteroscopy   History of smoking Former smoker with significant history, increasing risk for conditions like bladder cancer, previously ruled out.  Routine preventative health maintenance measures emphasized. Please refer to After Visit Summary for other counseling recommendations.   Follow-up: No follow-ups on file.      Lorriane Shire, MD Obstetrician & Gynecologist, Faculty [facility] [employer], [facility]

## 2024-03-14 ENCOUNTER — Ambulatory Visit (HOSPITAL_BASED_OUTPATIENT_CLINIC_OR_DEPARTMENT_OTHER)
Admission: RE | Admit: 2024-03-14 | Discharge: 2024-03-14 | Disposition: A | Discharge status: Home / Self Care | Source: Ambulatory Visit | Attending: Obstetrics and Gynecology | Admitting: Obstetrics and Gynecology

## 2024-03-14 DIAGNOSIS — N950 Postmenopausal bleeding: Secondary | ICD-10-CM | POA: Diagnosis not present

## 2024-03-14 DIAGNOSIS — N939 Abnormal uterine and vaginal bleeding, unspecified: Secondary | ICD-10-CM | POA: Diagnosis not present

## 2024-03-15 ENCOUNTER — Encounter: Payer: Self-pay | Admitting: Obstetrics and Gynecology

## 2024-03-15 ENCOUNTER — Encounter: Payer: Self-pay | Admitting: Family Medicine

## 2024-03-15 ENCOUNTER — Ambulatory Visit (INDEPENDENT_AMBULATORY_CARE_PROVIDER_SITE_OTHER): Admitting: Family Medicine

## 2024-03-15 VITALS — BP 118/60 | HR 61 | Wt 172.8 lb

## 2024-03-15 DIAGNOSIS — F1190 Opioid use, unspecified, uncomplicated: Secondary | ICD-10-CM | POA: Diagnosis not present

## 2024-03-15 DIAGNOSIS — G8929 Other chronic pain: Secondary | ICD-10-CM | POA: Diagnosis not present

## 2024-03-15 DIAGNOSIS — M545 Low back pain, unspecified: Secondary | ICD-10-CM

## 2024-03-15 LAB — CYTOLOGY - PAP
Comment: NEGATIVE
Diagnosis: NEGATIVE
High risk HPV: NEGATIVE

## 2024-03-15 MED ORDER — HYDROCODONE-ACETAMINOPHEN 5-325 MG PO TABS
1.0000 | ORAL_TABLET | Freq: Four times a day (QID) | ORAL | 0 refills | Status: DC | PRN
Start: 2024-03-15 — End: 2024-06-11

## 2024-03-15 MED ORDER — HYDROCODONE-ACETAMINOPHEN 5-325 MG PO TABS: 1.0000 | ORAL_TABLET | Freq: Four times a day (QID) | ORAL | 0 refills | Status: DC | PRN

## 2024-03-15 NOTE — Progress Notes (Signed)
   Subjective:    Patient ID: Deborah Bryant, female    DOB: [birth year], 78 y.o.   MRN: [id number]  HPI Here for pain management. She is doing well.    Review of Systems  Constitutional: Negative.   Musculoskeletal:  Positive for back pain.       Objective:   Physical Exam Constitutional:      Appearance: Normal appearance.  Neurological:     Mental Status: She is alert.           Assessment & Plan:  Pain management.  Indication for chronic opioid: low back pain Medication and dose: Norco 5-325 # pills per month: 120 Last UDS date: 08-19-23 Opioid Treatment Agreement signed (Y/N): 03-19-18 Opioid Treatment Agreement last reviewed with patient:  03-15-24 [facility] reviewed this encounter (include red flags): Yes Meds were refilled.  Gershon Crane, MD

## 2024-03-16 LAB — SURGICAL PATHOLOGY

## 2024-03-17 DIAGNOSIS — E1122 Type 2 diabetes mellitus with diabetic chronic kidney disease: Secondary | ICD-10-CM | POA: Diagnosis not present

## 2024-03-17 DIAGNOSIS — I130 Hypertensive heart and chronic kidney disease with heart failure and stage 1 through stage 4 chronic kidney disease, or unspecified chronic kidney disease: Secondary | ICD-10-CM | POA: Diagnosis not present

## 2024-03-17 DIAGNOSIS — N182 Chronic kidney disease, stage 2 (mild): Secondary | ICD-10-CM | POA: Diagnosis not present

## 2024-03-17 DIAGNOSIS — M0600 Rheumatoid arthritis without rheumatoid factor, unspecified site: Secondary | ICD-10-CM | POA: Diagnosis not present

## 2024-03-17 DIAGNOSIS — I5033 Acute on chronic diastolic (congestive) heart failure: Secondary | ICD-10-CM | POA: Diagnosis not present

## 2024-03-17 DIAGNOSIS — J440 Chronic obstructive pulmonary disease with acute lower respiratory infection: Secondary | ICD-10-CM | POA: Diagnosis not present

## 2024-03-19 ENCOUNTER — Encounter: Payer: Self-pay | Admitting: Cardiology

## 2024-03-19 ENCOUNTER — Encounter: Payer: Self-pay | Admitting: Obstetrics and Gynecology

## 2024-03-19 DIAGNOSIS — I700 Atherosclerosis of aorta: Secondary | ICD-10-CM | POA: Diagnosis not present

## 2024-03-19 DIAGNOSIS — E1122 Type 2 diabetes mellitus with diabetic chronic kidney disease: Secondary | ICD-10-CM | POA: Diagnosis not present

## 2024-03-19 DIAGNOSIS — I5033 Acute on chronic diastolic (congestive) heart failure: Secondary | ICD-10-CM | POA: Diagnosis not present

## 2024-03-19 DIAGNOSIS — Z7984 Long term (current) use of oral hypoglycemic drugs: Secondary | ICD-10-CM | POA: Diagnosis not present

## 2024-03-19 DIAGNOSIS — J440 Chronic obstructive pulmonary disease with acute lower respiratory infection: Secondary | ICD-10-CM | POA: Diagnosis not present

## 2024-03-19 DIAGNOSIS — I081 Rheumatic disorders of both mitral and tricuspid valves: Secondary | ICD-10-CM | POA: Diagnosis not present

## 2024-03-19 DIAGNOSIS — M109 Gout, unspecified: Secondary | ICD-10-CM | POA: Diagnosis not present

## 2024-03-19 DIAGNOSIS — Z6831 Body mass index (BMI) 31.0-31.9, adult: Secondary | ICD-10-CM | POA: Diagnosis not present

## 2024-03-19 DIAGNOSIS — M0600 Rheumatoid arthritis without rheumatoid factor, unspecified site: Secondary | ICD-10-CM | POA: Diagnosis not present

## 2024-03-19 DIAGNOSIS — I130 Hypertensive heart and chronic kidney disease with heart failure and stage 1 through stage 4 chronic kidney disease, or unspecified chronic kidney disease: Secondary | ICD-10-CM | POA: Diagnosis not present

## 2024-03-19 DIAGNOSIS — J188 Other pneumonia, unspecified organism: Secondary | ICD-10-CM | POA: Diagnosis not present

## 2024-03-19 DIAGNOSIS — R001 Bradycardia, unspecified: Secondary | ICD-10-CM | POA: Diagnosis not present

## 2024-03-19 DIAGNOSIS — Z9981 Dependence on supplemental oxygen: Secondary | ICD-10-CM | POA: Diagnosis not present

## 2024-03-19 DIAGNOSIS — E66811 Obesity, class 1: Secondary | ICD-10-CM | POA: Diagnosis not present

## 2024-03-19 DIAGNOSIS — Z7982 Long term (current) use of aspirin: Secondary | ICD-10-CM | POA: Diagnosis not present

## 2024-03-19 DIAGNOSIS — N182 Chronic kidney disease, stage 2 (mild): Secondary | ICD-10-CM | POA: Diagnosis not present

## 2024-03-23 DIAGNOSIS — N182 Chronic kidney disease, stage 2 (mild): Secondary | ICD-10-CM | POA: Diagnosis not present

## 2024-03-23 DIAGNOSIS — M0600 Rheumatoid arthritis without rheumatoid factor, unspecified site: Secondary | ICD-10-CM | POA: Diagnosis not present

## 2024-03-23 DIAGNOSIS — I5033 Acute on chronic diastolic (congestive) heart failure: Secondary | ICD-10-CM | POA: Diagnosis not present

## 2024-03-23 DIAGNOSIS — J440 Chronic obstructive pulmonary disease with acute lower respiratory infection: Secondary | ICD-10-CM | POA: Diagnosis not present

## 2024-03-23 DIAGNOSIS — I130 Hypertensive heart and chronic kidney disease with heart failure and stage 1 through stage 4 chronic kidney disease, or unspecified chronic kidney disease: Secondary | ICD-10-CM | POA: Diagnosis not present

## 2024-03-23 DIAGNOSIS — E1122 Type 2 diabetes mellitus with diabetic chronic kidney disease: Secondary | ICD-10-CM | POA: Diagnosis not present

## 2024-03-24 NOTE — Telephone Encounter (Signed)
 Copied from CRM [id number]. Topic: General - Other >> Mar 24, 2024  9:06 AM Baldemar Lenis P wrote: Reason for CRM: Kathy(Pt Physical therapist) advise she assist pt in physical therapy yesterday, notice there was discoloration in ring finger, pt has not been wearing oxygen often but has started back, Olegario Messier advise pt has no problem with breathing, just wanted to address concern for discoloration of ring finger(turning blue) Provided callback number [phone]

## 2024-03-26 ENCOUNTER — Other Ambulatory Visit: Payer: Self-pay

## 2024-03-26 ENCOUNTER — Emergency Department (HOSPITAL_BASED_OUTPATIENT_CLINIC_OR_DEPARTMENT_OTHER)
Admission: EM | Admit: 2024-03-26 | Discharge: 2024-03-26 | Disposition: A | Discharge status: Home / Self Care | Attending: Emergency Medicine | Admitting: Emergency Medicine

## 2024-03-26 ENCOUNTER — Encounter (HOSPITAL_BASED_OUTPATIENT_CLINIC_OR_DEPARTMENT_OTHER): Payer: Self-pay | Admitting: Emergency Medicine

## 2024-03-26 ENCOUNTER — Other Ambulatory Visit (HOSPITAL_BASED_OUTPATIENT_CLINIC_OR_DEPARTMENT_OTHER): Payer: Self-pay

## 2024-03-26 DIAGNOSIS — D72829 Elevated white blood cell count, unspecified: Secondary | ICD-10-CM | POA: Insufficient documentation

## 2024-03-26 DIAGNOSIS — R350 Frequency of micturition: Secondary | ICD-10-CM | POA: Diagnosis present

## 2024-03-26 DIAGNOSIS — I509 Heart failure, unspecified: Secondary | ICD-10-CM | POA: Insufficient documentation

## 2024-03-26 DIAGNOSIS — N3001 Acute cystitis with hematuria: Secondary | ICD-10-CM | POA: Insufficient documentation

## 2024-03-26 DIAGNOSIS — Z7982 Long term (current) use of aspirin: Secondary | ICD-10-CM | POA: Diagnosis not present

## 2024-03-26 DIAGNOSIS — E119 Type 2 diabetes mellitus without complications: Secondary | ICD-10-CM | POA: Insufficient documentation

## 2024-03-26 LAB — URINALYSIS, MICROSCOPIC (REFLEX): RBC / HPF: >50 RBC/hpf (ref 0–5)

## 2024-03-26 LAB — CBC
HCT: 40.1 % (ref 36.0–46.0)
Hemoglobin: 13 g/dL (ref 12.0–15.0)
MCH: 29.3 pg (ref 26.0–34.0)
MCHC: 32.4 g/dL (ref 30.0–36.0)
MCV: 90.3 fL (ref 80.0–100.0)
Platelets: 243 10*3/uL (ref 150–400)
RBC: 4.44 MIL/uL (ref 3.87–5.11)
RDW: 16.8 % — ABNORMAL HIGH (ref 11.5–15.5)
WBC: 13.2 10*3/uL — ABNORMAL HIGH (ref 4.0–10.5)
nRBC: 0 % (ref 0.0–0.2)

## 2024-03-26 LAB — BASIC METABOLIC PANEL WITH GFR
Anion gap: 13 (ref 5–15)
BUN: 26 mg/dL — ABNORMAL HIGH (ref 8–23)
CO2: 25 mmol/L (ref 22–32)
Calcium: 9.7 mg/dL (ref 8.9–10.3)
Chloride: 100 mmol/L (ref 98–111)
Creatinine, Ser: 1.06 mg/dL — ABNORMAL HIGH (ref 0.44–1.00)
GFR, Estimated: 54 mL/min — ABNORMAL LOW (ref >60)
Glucose, Bld: 98 mg/dL (ref 70–99)
Potassium: 3.6 mmol/L (ref 3.5–5.1)
Sodium: 138 mmol/L (ref 135–145)

## 2024-03-26 LAB — URINALYSIS, ROUTINE W REFLEX MICROSCOPIC
Bilirubin Urine: NEGATIVE
Glucose, UA: NEGATIVE mg/dL
Ketones, ur: NEGATIVE mg/dL
Nitrite: NEGATIVE
Protein, ur: 100 mg/dL — AB
Specific Gravity, Urine: 1.015 (ref 1.005–1.030)
pH: 6 (ref 5.0–8.0)

## 2024-03-26 MED ORDER — CEFADROXIL 500 MG PO CAPS
1000.0000 mg | ORAL_CAPSULE | Freq: Two times a day (BID) | ORAL | 0 refills | Status: AC
  Filled 2024-03-26: qty 28, 7d supply, fill #0

## 2024-03-26 NOTE — ED Notes (Addendum)
 Multiple attempts were made at obtaining pt's temperature in triage. Temporal temperature: 95.3 F and tympanic temperature 93.7 F. Unable to get a reading using oral and axillary temperature probes.

## 2024-03-26 NOTE — ED Triage Notes (Signed)
 Dysuria and hematuria x 1 day. Denies abd pain , Hx UTI

## 2024-03-26 NOTE — Discharge Instructions (Signed)
 You are seen in the emergency department today for concerns of possible UTI.  Your urine testing does appear to be consistent with a UTI infection.  For this reason, I have started you on a course of antibiotics to be taken twice daily for the next 7 days.  Please take this as prescribed.  For any concerns of new or worsening symptoms such as development of fevers, worsening low body temperature, severe pain, or intractable nausea and vomiting, please return the emergency department for assessment.

## 2024-03-26 NOTE — ED Provider Notes (Signed)
 [facility] Provider Note   CSN: [id number] Arrival date & time: 03/26/24  1307     History Chief Complaint  Patient presents with   Urinary Frequency    Deborah Bryant is a 78 y.o. female.  Patient with past history significant for diabetes, IBS, congestive heart failure presents emergency department today with concerns of urinary frequency.  She reports symptoms of dysuria, hematuria, and increased frequency for the last day.  Denies any significant abdominal pain or flank tenderness.  Reports history of prior UTIs and this feels similar to UTIs.  No recent nausea, vomiting, diarrhea, fevers or chills.  Per son at bedside, patient is at baseline mentation with no signs of confusion.   Urinary Frequency       Home Medications Prior to Admission medications   Medication Sig Start Date End Date Taking? Authorizing Provider  cefadroxil (DURICEF) 500 MG capsule Take 2 capsules (1,000 mg total) by mouth 2 (two) times daily for 7 days. 03/26/24 04/02/24 Yes Natahsa Marian A, PA-C  allopurinol (ZYLOPRIM) 300 MG tablet Take 1 tablet (300 mg total) by mouth daily. 08/19/23   Donley Furth, MD  Ascorbic Acid (VITAMIN C) 1000 MG tablet Take 1,000 mg by mouth daily.    [provider]  aspirin EC 81 MG tablet Take 81 mg by mouth daily.    [provider]  atorvastatin (LIPITOR) 40 MG tablet Take 1 tablet (40 mg total) by mouth daily. 08/19/23   Donley Furth, MD  Cholecalciferol (VITAMIN D) 2000 units CAPS Take 2,000 Units by mouth daily. OTC    [provider]  diazepam (VALIUM) 5 MG tablet Take 1-2 tablets (5-10 mg total) by mouth every 8 (eight) hours as needed for muscle spasms. 01/03/24   Ghimire, Estil Heman, MD  glipiZIDE (GLUCOTROL XL) 5 MG 24 hr tablet Take 1 tablet (5 mg total) by mouth daily with breakfast. 08/19/23   Donley Furth, MD  HYDROcodone-acetaminophen (NORCO) 5-325 MG tablet Take 1 tablet by mouth every 6  (six) hours as needed for moderate pain (pain score 4-6). 03/15/24   Donley Furth, MD  HYDROcodone-acetaminophen (NORCO) 5-325 MG tablet Take 1 tablet by mouth every 6 (six) hours as needed for moderate pain (pain score 4-6). 03/15/24   Donley Furth, MD  HYDROcodone-acetaminophen (NORCO) 5-325 MG tablet Take 1 tablet by mouth every 6 (six) hours as needed for moderate pain (pain score 4-6). 03/15/24   Donley Furth, MD  hydroxychloroquine (PLAQUENIL) 200 MG tablet Take 200 mg by mouth 2 (two) times daily. Pt reported changed by Rheumatology    [provider]  LASIX 20 MG tablet Take 4 tablets (80 mg total) by mouth daily. Taking 2 tablets daily 01/03/24   Ghimire, Estil Heman, MD  polyethylene glycol powder (GLYCOLAX/MIRALAX) powder Take 8.5-17 g by mouth at bedtime.     [provider]  potassium chloride (KLOR-CON 10) 10 MEQ tablet Take 2 tablets (20 mEq total) by mouth daily. 01/03/24   Ghimire, Estil Heman, MD  saccharomyces boulardii (FLORASTOR) 250 MG capsule Take 250 mg by mouth daily.    [provider]      Allergies    Losartan, Benazepril, Lasix [furosemide], Levaquin [levofloxacin], and Phenobarbital    Review of Systems   Review of Systems  Genitourinary:  Positive for frequency.  All other systems reviewed and are negative.   Physical Exam Updated Vital Signs BP 124/83   Pulse  64   Temp (!) 94.1 F (34.5 C) (Rectal)   Resp 18   Wt 79 kg   SpO2 100%   BMI 31.85 kg/m  Physical Exam Vitals and nursing note reviewed.  Constitutional:      General: She is not in acute distress.    Appearance: She is well-developed.  HENT:     Head: Normocephalic and atraumatic.  Eyes:     Conjunctiva/sclera: Conjunctivae normal.  Cardiovascular:     Rate and Rhythm: Normal rate and regular rhythm.     Heart sounds: No murmur heard. Pulmonary:     Effort: Pulmonary effort is normal. No respiratory distress.     Breath sounds: No wheezing or rales.   Abdominal:     General: Abdomen is flat. Bowel sounds are normal. There is no distension.     Palpations: Abdomen is soft.     Tenderness: There is no abdominal tenderness. There is no right CVA tenderness, left CVA tenderness or guarding.  Musculoskeletal:        General: No swelling.     Cervical back: Neck supple.  Skin:    General: Skin is warm and dry.     Capillary Refill: Capillary refill takes less than 2 seconds.  Neurological:     Mental Status: She is alert.  Psychiatric:        Mood and Affect: Mood normal.     ED Results / Procedures / Treatments   Labs (all labs ordered are listed, but only abnormal results are displayed) Labs Reviewed  URINALYSIS, ROUTINE W REFLEX MICROSCOPIC - Abnormal; Notable for the following components:      Result Value   Color, Urine AMBER (*)    APPearance CLOUDY (*)    Hgb urine dipstick LARGE (*)    Protein, ur 100 (*)    Leukocytes,Ua SMALL (*)    All other components within normal limits  CBC - Abnormal; Notable for the following components:   WBC 13.2 (*)    RDW 16.8 (*)    All other components within normal limits  BASIC METABOLIC PANEL WITH GFR - Abnormal; Notable for the following components:   BUN 26 (*)    Creatinine, Ser 1.06 (*)    GFR, Estimated 54 (*)    All other components within normal limits  URINALYSIS, MICROSCOPIC (REFLEX) - Abnormal; Notable for the following components:   Bacteria, UA FEW (*)    All other components within normal limits  URINE CULTURE    EKG None  Radiology No results found.  Procedures Procedures    Medications Ordered in ED Medications - No data to display  ED Course/ Medical Decision Making/ A&P                                 Medical Decision Making Amount and/or Complexity of Data Reviewed Labs: ordered.  Risk Prescription drug management.   This patient presents to the ED for concern of urinary frequency.  Differential diagnosis includes UTI, pyelonephritis,  urolithiasis, bowel obstruction, urosepsis   Lab Tests:  I Ordered, and personally interpreted labs.  The pertinent results include: CBC with white count at 13.2, BMP with no evident AKI and baseline renal dysfunction, UA consistent with infection, urine culture pending.   Problem List / ED Course:  Patient presents to the emergency department today with concerns of urinary frequency.  She reports that this has been ongoing for the last  day with increased frequency, hematuria, and some painful urination.  Denies any pain in the abdomen or in the back or flanks.  No recent fever, chills or bodyaches.  Patient's son at bedside who denies any changes of mentation or increased confusion. Physical exam thankfully reassuring.  No abnormal abdominal tenderness or abnormal bowel sounds.  No flank tenderness.  Of note, patient has a decrease in her temperature at 94.1 F on rectal temperature.  She denies any recent outdoor cold exposure.  Unclear source of this temperature.  Patient otherwise well-appearing so unclear if this temperature significant for patient's current illness course or not. Lab workup shows signs of infection on urinalysis.  With mild leukocytosis at 13.2, and no evident AKI, patient likely stable for outpatient follow-up and discharged home with antibiotics.  Discussed this treatment plan with patient and patient's son are agreeable.  Advise return precautions such as concerns for new or worsening symptoms.  Discharged home in stable condition.  Final Clinical Impression(s) / ED Diagnoses Final diagnoses:  Acute cystitis with hematuria    Rx / DC Orders ED Discharge Orders          Ordered    cefadroxil (DURICEF) 500 MG capsule  2 times daily        03/26/24 1504              Jenilee Franey A, PA-C 03/26/24 1602    Almond Army, MD 03/27/24 [phone]

## 2024-03-28 LAB — URINE CULTURE
Culture: 40000 — AB
Special Requests: NORMAL

## 2024-03-29 ENCOUNTER — Telehealth (HOSPITAL_BASED_OUTPATIENT_CLINIC_OR_DEPARTMENT_OTHER): Payer: Self-pay

## 2024-03-29 DIAGNOSIS — M0600 Rheumatoid arthritis without rheumatoid factor, unspecified site: Secondary | ICD-10-CM | POA: Diagnosis not present

## 2024-03-29 DIAGNOSIS — E1122 Type 2 diabetes mellitus with diabetic chronic kidney disease: Secondary | ICD-10-CM | POA: Diagnosis not present

## 2024-03-29 DIAGNOSIS — I130 Hypertensive heart and chronic kidney disease with heart failure and stage 1 through stage 4 chronic kidney disease, or unspecified chronic kidney disease: Secondary | ICD-10-CM | POA: Diagnosis not present

## 2024-03-29 DIAGNOSIS — J440 Chronic obstructive pulmonary disease with acute lower respiratory infection: Secondary | ICD-10-CM | POA: Diagnosis not present

## 2024-03-29 DIAGNOSIS — N182 Chronic kidney disease, stage 2 (mild): Secondary | ICD-10-CM | POA: Diagnosis not present

## 2024-03-29 DIAGNOSIS — I5033 Acute on chronic diastolic (congestive) heart failure: Secondary | ICD-10-CM | POA: Diagnosis not present

## 2024-03-29 NOTE — Telephone Encounter (Signed)
 Post ED Visit - Positive Culture Follow-up  Culture report reviewed by antimicrobial stewardship pharmacist: [facility] [x]  Argentina Bees, Pharm.D. []  Skeet Duke, Pharm.D., BCPS AQ-ID []  Leslee Rase, Pharm.D., BCPS []  Garland Junk, Pharm.D., BCPS []  Morrison, [address].D., BCPS, AAHIVP []  Alcide Aly, Pharm.D., BCPS, AAHIVP []  Jerri Morale, PharmD, BCPS []  Graham Laws, PharmD, BCPS []  Cleda Curly, PharmD, BCPS []  Tamar Fairly, PharmD []  Ballard Levels, PharmD, BCPS []  Ollen Beverage, PharmD  [facility] []  Arlyne Bering, PharmD []  Sherryle Don, PharmD []  [place] Gelinas, PharmD []  Delila Felty, Rph []  [place]) Cleora Daft, PharmD []  Augustina Block, PharmD []  Arie Kurtz, PharmD []  Sharlyn Deaner, PharmD []  Agnes Hose, PharmD []  Kendall Pauls, PharmD []  Gladstone Lamer, PharmD []  Armanda Bern, PharmD []  Tera Fellows, PharmD   Positive urine culture Treated with Cefadroxil, organism sensitive to the same and no further patient follow-up is required at this time.  Delena Feil 03/29/2024, 9:25 AM

## 2024-04-01 DIAGNOSIS — I130 Hypertensive heart and chronic kidney disease with heart failure and stage 1 through stage 4 chronic kidney disease, or unspecified chronic kidney disease: Secondary | ICD-10-CM | POA: Diagnosis not present

## 2024-04-01 DIAGNOSIS — J440 Chronic obstructive pulmonary disease with acute lower respiratory infection: Secondary | ICD-10-CM | POA: Diagnosis not present

## 2024-04-01 DIAGNOSIS — E1122 Type 2 diabetes mellitus with diabetic chronic kidney disease: Secondary | ICD-10-CM | POA: Diagnosis not present

## 2024-04-01 DIAGNOSIS — I5033 Acute on chronic diastolic (congestive) heart failure: Secondary | ICD-10-CM | POA: Diagnosis not present

## 2024-04-01 DIAGNOSIS — M0600 Rheumatoid arthritis without rheumatoid factor, unspecified site: Secondary | ICD-10-CM | POA: Diagnosis not present

## 2024-04-01 DIAGNOSIS — N182 Chronic kidney disease, stage 2 (mild): Secondary | ICD-10-CM | POA: Diagnosis not present

## 2024-04-05 DIAGNOSIS — E1122 Type 2 diabetes mellitus with diabetic chronic kidney disease: Secondary | ICD-10-CM | POA: Diagnosis not present

## 2024-04-05 DIAGNOSIS — J440 Chronic obstructive pulmonary disease with acute lower respiratory infection: Secondary | ICD-10-CM | POA: Diagnosis not present

## 2024-04-05 DIAGNOSIS — N182 Chronic kidney disease, stage 2 (mild): Secondary | ICD-10-CM | POA: Diagnosis not present

## 2024-04-05 DIAGNOSIS — I5033 Acute on chronic diastolic (congestive) heart failure: Secondary | ICD-10-CM | POA: Diagnosis not present

## 2024-04-05 DIAGNOSIS — I130 Hypertensive heart and chronic kidney disease with heart failure and stage 1 through stage 4 chronic kidney disease, or unspecified chronic kidney disease: Secondary | ICD-10-CM | POA: Diagnosis not present

## 2024-04-05 DIAGNOSIS — M0600 Rheumatoid arthritis without rheumatoid factor, unspecified site: Secondary | ICD-10-CM | POA: Diagnosis not present

## 2024-04-07 DIAGNOSIS — E1122 Type 2 diabetes mellitus with diabetic chronic kidney disease: Secondary | ICD-10-CM | POA: Diagnosis not present

## 2024-04-07 DIAGNOSIS — N182 Chronic kidney disease, stage 2 (mild): Secondary | ICD-10-CM | POA: Diagnosis not present

## 2024-04-07 DIAGNOSIS — I130 Hypertensive heart and chronic kidney disease with heart failure and stage 1 through stage 4 chronic kidney disease, or unspecified chronic kidney disease: Secondary | ICD-10-CM | POA: Diagnosis not present

## 2024-04-07 DIAGNOSIS — I5033 Acute on chronic diastolic (congestive) heart failure: Secondary | ICD-10-CM | POA: Diagnosis not present

## 2024-04-07 DIAGNOSIS — J440 Chronic obstructive pulmonary disease with acute lower respiratory infection: Secondary | ICD-10-CM | POA: Diagnosis not present

## 2024-04-07 DIAGNOSIS — M0600 Rheumatoid arthritis without rheumatoid factor, unspecified site: Secondary | ICD-10-CM | POA: Diagnosis not present

## 2024-04-12 DIAGNOSIS — J440 Chronic obstructive pulmonary disease with acute lower respiratory infection: Secondary | ICD-10-CM | POA: Diagnosis not present

## 2024-04-12 DIAGNOSIS — I5033 Acute on chronic diastolic (congestive) heart failure: Secondary | ICD-10-CM | POA: Diagnosis not present

## 2024-04-12 DIAGNOSIS — I130 Hypertensive heart and chronic kidney disease with heart failure and stage 1 through stage 4 chronic kidney disease, or unspecified chronic kidney disease: Secondary | ICD-10-CM | POA: Diagnosis not present

## 2024-04-12 DIAGNOSIS — M0600 Rheumatoid arthritis without rheumatoid factor, unspecified site: Secondary | ICD-10-CM | POA: Diagnosis not present

## 2024-04-12 DIAGNOSIS — N182 Chronic kidney disease, stage 2 (mild): Secondary | ICD-10-CM | POA: Diagnosis not present

## 2024-04-12 DIAGNOSIS — E1122 Type 2 diabetes mellitus with diabetic chronic kidney disease: Secondary | ICD-10-CM | POA: Diagnosis not present

## 2024-04-13 DIAGNOSIS — N182 Chronic kidney disease, stage 2 (mild): Secondary | ICD-10-CM | POA: Diagnosis not present

## 2024-04-13 DIAGNOSIS — E1122 Type 2 diabetes mellitus with diabetic chronic kidney disease: Secondary | ICD-10-CM | POA: Diagnosis not present

## 2024-04-13 DIAGNOSIS — I5033 Acute on chronic diastolic (congestive) heart failure: Secondary | ICD-10-CM | POA: Diagnosis not present

## 2024-04-13 DIAGNOSIS — J440 Chronic obstructive pulmonary disease with acute lower respiratory infection: Secondary | ICD-10-CM | POA: Diagnosis not present

## 2024-04-13 DIAGNOSIS — M0600 Rheumatoid arthritis without rheumatoid factor, unspecified site: Secondary | ICD-10-CM | POA: Diagnosis not present

## 2024-04-13 DIAGNOSIS — I130 Hypertensive heart and chronic kidney disease with heart failure and stage 1 through stage 4 chronic kidney disease, or unspecified chronic kidney disease: Secondary | ICD-10-CM | POA: Diagnosis not present

## 2024-04-27 ENCOUNTER — Ambulatory Visit: Admitting: Pulmonary Disease

## 2024-04-27 ENCOUNTER — Encounter: Payer: Self-pay | Admitting: Pulmonary Disease

## 2024-04-27 VITALS — BP 132/77 | HR 64 | Ht 62.0 in | Wt 172.0 lb

## 2024-04-27 DIAGNOSIS — J9611 Chronic respiratory failure with hypoxia: Secondary | ICD-10-CM | POA: Diagnosis not present

## 2024-04-27 DIAGNOSIS — F17210 Nicotine dependence, cigarettes, uncomplicated: Secondary | ICD-10-CM | POA: Diagnosis not present

## 2024-04-27 DIAGNOSIS — J449 Chronic obstructive pulmonary disease, unspecified: Secondary | ICD-10-CM | POA: Diagnosis not present

## 2024-04-27 MED ORDER — FLUTICASONE-SALMETEROL 115-21 MCG/ACT IN AERO: 2.0000 | INHALATION_SPRAY | Freq: Two times a day (BID) | RESPIRATORY_TRACT | 12 refills | Status: DC

## 2024-04-27 MED ORDER — ALBUTEROL SULFATE HFA 108 (90 BASE) MCG/ACT IN AERS: 2.0000 | INHALATION_SPRAY | Freq: Four times a day (QID) | RESPIRATORY_TRACT | 6 refills | Status: AC | PRN

## 2024-04-27 NOTE — Patient Instructions (Addendum)
 Start advair HFA inhaler 2 puffs twice daily - rinse mouth out after each use  Use albuterol  inhaler 1-2 puffs every 4-6 hours as needed  Continue supplemental oxygen   We will consider checking a CT Chest scan in the future   Follow up in 3 months

## 2024-04-27 NOTE — Progress Notes (Addendum)
 Synopsis: Referred in May 2025 for COPD  Subjective:   PATIENT ID: Deborah Bryant GENDER: female DOB: 05-02-1946, MRN: [id number]   HPI  Chief Complaint  Patient presents with   Consult    Pt states reoccurring pneumonia    Deborah Bryant is a 78 year old woman, daily smoker with GERD, CKD, CHF, rheumatoid arthritis and DMII who is referred to pulmonary clinic for COPD.   She was admitted 1/12 to 1/18 for pneumonia and acute hypoxemic respiratory failure. She was started on 1-2L of oxygen  at that time.   She is not currently using any inhalers. She does have exertional dyspnea.  She has quit smoking since her hospitalization.     Past Medical History:  Diagnosis Date   Allergic rhinitis    Anxiety    Arthritis    sees Dr. Stefan Edge    Cellulitis, periorbital 2006   left eye, due to MRSA, saw Dr. Archer Kobs    Chronic kidney disease    sees Dr. Nicolas Barren   Complication of anesthesia    COPD (chronic obstructive pulmonary disease) (HCC)    Cough    Diabetes mellitus    Dyspnea    with exertion   GERD (gastroesophageal reflux disease)    OTC  Pepcid    Hemorrhoids    Hyperlipidemia    Hypertension    IBS (irritable bowel syndrome)    Loss of smell    Loss of taste    Memory changes    Menopause    MRSA (methicillin resistant Staphylococcus aureus) 2006   left orbit    Pneumonia    PONV (postoperative nausea and vomiting)    Tubular adenoma of colon 12/2006     Family History  Problem Relation Age of Onset   Alcohol abuse Mother 54       MVA   Diabetes Mother    Heart disease Father 46       MI   Heart disease Brother    Diabetes Brother    Arthritis Sister    Dementia Maternal Grandfather      Social History   Socioeconomic History   Marital status: Married    Spouse name: Tom   Number of children: 1   Years of education: college   Highest education level: Bachelor's degree (e.g., BA, AB, BS)  Occupational History    Employer:  RETIRED  Tobacco Use   Smoking status: Every Day    Current packs/day: 0.50    Average packs/day: 0.5 packs/day for 1 year (0.5 ttl pk-yrs)    Types: Cigarettes   Smokeless tobacco: Never  Vaping Use   Vaping status: Never Used  Substance and Sexual Activity   Alcohol use: Not Currently    Comment: noen sinc e2019   Drug use: Never   Sexual activity: Not Currently    Birth control/protection: Post-menopausal  Other Topics Concern   Not on file  Social History Narrative   Patient is right-handed.she lives with her husband in a one level home. She drinks decaf coffee, 2 cups a day. One cup iced teas per day. She goes to the gym 3 x a week and is doing P/T 1 x a week.     Social Drivers of Corporate investment banker Strain: Not on file  Food Insecurity: No Food Insecurity (12/28/2023)   Hunger Vital Sign    Worried About Running Out of Food in the Last Year: Never true    Ran Out of  Food in the Last Year: Never true  Transportation Needs: No Transportation Needs (12/28/2023)   PRAPARE - Administrator, Civil Service (Medical): No    Lack of Transportation (Non-Medical): No  Physical Activity: Not on file  Stress: Not on file  Social Connections: Moderately Isolated (12/28/2023)   Social Connection and Isolation Panel [NHANES]    Frequency of Communication with Friends and Family: More than three times a week    Frequency of Social Gatherings with Friends and Family: Never    Attends Religious Services: Never    Database administrator or Organizations: No    Attends Banker Meetings: Never    Marital Status: Married  Catering manager Violence: Not At Risk (12/28/2023)   Humiliation, Afraid, Rape, and Kick questionnaire    Fear of Current or Ex-Partner: No    Emotionally Abused: No    Physically Abused: No    Sexually Abused: No     Allergies  Allergen Reactions   Losartan  Shortness Of Breath   Benazepril  Cough   Lasix  [Furosemide ] Other (See  Comments)    Rash, changes in eyesight   Levaquin [Levofloxacin] Other (See Comments)    Tendon and muscle pain   Phenobarbital Nausea And Vomiting     Outpatient Medications Prior to Visit  Medication Sig Dispense Refill   allopurinol  (ZYLOPRIM ) 300 MG tablet Take 1 tablet (300 mg total) by mouth daily. 90 tablet 3   Ascorbic Acid  (VITAMIN C ) 1000 MG tablet Take 1,000 mg by mouth daily.     aspirin  EC 81 MG tablet Take 81 mg by mouth daily.     atorvastatin  (LIPITOR) 40 MG tablet Take 1 tablet (40 mg total) by mouth daily. 90 tablet 3   Cholecalciferol  (VITAMIN D ) 2000 units CAPS Take 2,000 Units by mouth daily. OTC     diazepam  (VALIUM ) 5 MG tablet Take 1-2 tablets (5-10 mg total) by mouth every 8 (eight) hours as needed for muscle spasms. 20 tablet 0   glipiZIDE  (GLUCOTROL  XL) 5 MG 24 hr tablet Take 1 tablet (5 mg total) by mouth daily with breakfast. 90 tablet 3   HYDROcodone -acetaminophen  (NORCO) 5-325 MG tablet Take 1 tablet by mouth every 6 (six) hours as needed for moderate pain (pain score 4-6). 120 tablet 0   HYDROcodone -acetaminophen  (NORCO) 5-325 MG tablet Take 1 tablet by mouth every 6 (six) hours as needed for moderate pain (pain score 4-6). 120 tablet 0   HYDROcodone -acetaminophen  (NORCO) 5-325 MG tablet Take 1 tablet by mouth every 6 (six) hours as needed for moderate pain (pain score 4-6). 120 tablet 0   hydroxychloroquine  (PLAQUENIL ) 200 MG tablet Take 200 mg by mouth 2 (two) times daily. Pt reported changed by Rheumatology     LASIX  20 MG tablet Take 4 tablets (80 mg total) by mouth daily. Taking 2 tablets daily     polyethylene glycol powder (GLYCOLAX /MIRALAX ) powder Take 8.5-17 g by mouth at bedtime.      potassium chloride  (KLOR-CON  10) 10 MEQ tablet Take 2 tablets (20 mEq total) by mouth daily.     saccharomyces boulardii (FLORASTOR) 250 MG capsule Take 250 mg by mouth daily.     No facility-administered medications prior to visit.    Review of Systems   Constitutional:  Negative for chills, fever, malaise/fatigue and weight loss.  HENT:  Negative for congestion, sinus pain and sore throat.   Eyes: Negative.   Respiratory:  Positive for shortness of breath. Negative for cough, hemoptysis,  sputum production and wheezing.   Cardiovascular:  Negative for chest pain, palpitations, orthopnea, claudication and leg swelling.  Gastrointestinal:  Negative for abdominal pain, heartburn, nausea and vomiting.  Genitourinary: Negative.   Musculoskeletal:  Negative for joint pain and myalgias.  Skin:  Negative for rash.  Neurological:  Negative for weakness.  Endo/Heme/Allergies: Negative.   Psychiatric/Behavioral: Negative.     Objective:   Vitals:   04/27/24 1400  BP: 132/77  Pulse: 64  SpO2: 90%  Weight: 172 lb (78 kg)  Height: 5\' 2"  (1.575 m)    Physical Exam Constitutional:      General: She is not in acute distress.    Appearance: Normal appearance.  Eyes:     General: No scleral icterus.    Conjunctiva/sclera: Conjunctivae normal.  Cardiovascular:     Rate and Rhythm: Normal rate and regular rhythm.  Pulmonary:     Breath sounds: No wheezing, rhonchi or rales.  Musculoskeletal:     Right lower leg: No edema.     Left lower leg: No edema.  Skin:    General: Skin is warm and dry.  Neurological:     General: No focal deficit present.       CBC    Component Value Date/Time   WBC 13.2 (H) 03/26/2024 1319   RBC 4.44 03/26/2024 1319   HGB 13.0 03/26/2024 1319   HCT 40.1 03/26/2024 1319   PLT 243 03/26/2024 1319   MCV 90.3 03/26/2024 1319   MCH 29.3 03/26/2024 1319   MCHC 32.4 03/26/2024 1319   RDW 16.8 (H) 03/26/2024 1319   LYMPHSABS 0.4 (L) 12/30/2023 0441   MONOABS 1.0 12/30/2023 0441   EOSABS 0.0 12/30/2023 0441   BASOSABS 0.0 12/30/2023 0441      Latest Ref Rng & Units 03/26/2024    1:19 PM 01/21/2024   11:14 AM 01/03/2024    5:24 AM  BMP  Glucose 70 - 99 mg/dL 98  295  93   BUN 8 - 23 mg/dL 26  16  17     Creatinine 0.44 - 1.00 mg/dL 6.21  3.08  6.57   BUN/Creat Ratio 12 - 28  15    Sodium 135 - 145 mmol/L 138  139  136   Potassium 3.5 - 5.1 mmol/L 3.6  4.3  3.6   Chloride 98 - 111 mmol/L 100  100  95   CO2 22 - 32 mmol/L 25  19  30    Calcium  8.9 - 10.3 mg/dL 9.7  9.6  9.0    Chest imaging: CXR 02/13/24 Improved aeration from the prior, with currently no definite evidence of acute cardiopulmonary disease  PFT:     No data to display          Labs:  Path:  Echo:  Heart Catheterization:    Assessment & Plan:   Chronic obstructive pulmonary disease, unspecified COPD type (HCC) - Plan: fluticasone -salmeterol (ADVAIR HFA) 115-21 MCG/ACT inhaler, albuterol  (VENTOLIN  HFA) 108 (90 Base) MCG/ACT inhaler  Chronic hypoxemic respiratory failure (HCC)  Discussion: Deborah Bryant is a 78 year old woman, daily smoker with GERD, CKD, CHF, rheumatoid arthritis and DMII who is referred to pulmonary clinic for COPD.   COPD Chronic Hypoxemic Respiratory Failure - start advair diskus 1 puff twice daily - Use albuterol  inhaler 1-2 puffs every 4-6 hours as needed - continue supplemental oxygen  - will need to perform walk test and overnight oxygen  testing in the future - Consider CT Chest in the future - will need  to schedule for PFTs if able  Duaine German, MD [facility] Office: [phone]    Current Outpatient Medications:    albuterol  (VENTOLIN  HFA) 108 (90 Base) MCG/ACT inhaler, Inhale 2 puffs into the lungs every 6 (six) hours as needed for wheezing or shortness of breath., Disp: 8 g, Rfl: 6   allopurinol  (ZYLOPRIM ) 300 MG tablet, Take 1 tablet (300 mg total) by mouth daily., Disp: 90 tablet, Rfl: 3   Ascorbic Acid  (VITAMIN C ) 1000 MG tablet, Take 1,000 mg by mouth daily., Disp: , Rfl:    aspirin  EC 81 MG tablet, Take 81 mg by mouth daily., Disp: , Rfl:    atorvastatin  (LIPITOR) 40 MG tablet, Take 1 tablet (40 mg total) by mouth daily., Disp: 90 tablet,  Rfl: 3   Cholecalciferol  (VITAMIN D ) 2000 units CAPS, Take 2,000 Units by mouth daily. OTC, Disp: , Rfl:    diazepam  (VALIUM ) 5 MG tablet, Take 1-2 tablets (5-10 mg total) by mouth every 8 (eight) hours as needed for muscle spasms., Disp: 20 tablet, Rfl: 0   fluticasone -salmeterol (ADVAIR HFA) 115-21 MCG/ACT inhaler, Inhale 2 puffs into the lungs 2 (two) times daily., Disp: 1 each, Rfl: 12   glipiZIDE  (GLUCOTROL  XL) 5 MG 24 hr tablet, Take 1 tablet (5 mg total) by mouth daily with breakfast., Disp: 90 tablet, Rfl: 3   HYDROcodone -acetaminophen  (NORCO) 5-325 MG tablet, Take 1 tablet by mouth every 6 (six) hours as needed for moderate pain (pain score 4-6)., Disp: 120 tablet, Rfl: 0   HYDROcodone -acetaminophen  (NORCO) 5-325 MG tablet, Take 1 tablet by mouth every 6 (six) hours as needed for moderate pain (pain score 4-6)., Disp: 120 tablet, Rfl: 0   HYDROcodone -acetaminophen  (NORCO) 5-325 MG tablet, Take 1 tablet by mouth every 6 (six) hours as needed for moderate pain (pain score 4-6)., Disp: 120 tablet, Rfl: 0   hydroxychloroquine  (PLAQUENIL ) 200 MG tablet, Take 200 mg by mouth 2 (two) times daily. Pt reported changed by Rheumatology, Disp: , Rfl:    LASIX  20 MG tablet, Take 4 tablets (80 mg total) by mouth daily. Taking 2 tablets daily, Disp: , Rfl:    polyethylene glycol powder (GLYCOLAX /MIRALAX ) powder, Take 8.5-17 g by mouth at bedtime. , Disp: , Rfl:    potassium chloride  (KLOR-CON  10) 10 MEQ tablet, Take 2 tablets (20 mEq total) by mouth daily., Disp: , Rfl:    saccharomyces boulardii (FLORASTOR) 250 MG capsule, Take 250 mg by mouth daily., Disp: , Rfl:

## 2024-04-28 ENCOUNTER — Encounter: Payer: Self-pay | Admitting: Pulmonary Disease

## 2024-04-28 DIAGNOSIS — N182 Chronic kidney disease, stage 2 (mild): Secondary | ICD-10-CM | POA: Diagnosis not present

## 2024-04-30 ENCOUNTER — Other Ambulatory Visit (HOSPITAL_COMMUNITY)
Admission: RE | Admit: 2024-04-30 | Discharge: 2024-04-30 | Disposition: A | Discharge status: Home / Self Care | Source: Ambulatory Visit | Attending: Obstetrics and Gynecology | Admitting: Obstetrics and Gynecology

## 2024-04-30 ENCOUNTER — Ambulatory Visit: Admitting: Obstetrics and Gynecology

## 2024-04-30 VITALS — BP 130/59 | HR 62 | Wt 170.0 lb

## 2024-04-30 DIAGNOSIS — N840 Polyp of corpus uteri: Secondary | ICD-10-CM | POA: Diagnosis not present

## 2024-04-30 DIAGNOSIS — N950 Postmenopausal bleeding: Secondary | ICD-10-CM | POA: Diagnosis not present

## 2024-04-30 NOTE — Progress Notes (Signed)
 INDICATIONS: 78 y.o. G1P1001  here for scheduled procedure   Risks of surgery were discussed with the patient including but not limited to: bleeding which may require transfusion; infection which may require antibiotics; injury to uterus or surrounding organs; intrauterine scarring which may impair future fertility; need for additional procedures including laparotomy or laparoscopy; and other postoperative/anesthesia complications. Written informed consent was obtained.    FINDINGS:  atrophic cervix with large polyp emanating from os, atrophic endometrial cavity, bilateral tubal ostia visualized, smaller polyp at posterior wall   ANESTHESIA:   paracervical block. ESTIMATED BLOOD LOSS:  Less than 20 ml SPECIMENS: endometrial polyp COMPLICATIONS:  None immediate.  PROCEDURE DETAILS:  The patient was taken to the procedure room.   After an adequate timeout was performed, she was placed in the dorsal lithotomy position and examined.   A speculum was then placed in the patient's vagina.  A paracervical block of 10cc of 1% lidocaine  was placed with 5cc at both 5 and 7 o'clock.  A single tooth tenaculum was applied to the anterior lip of the cervix.   A 5mm hysteroscope was inserted under direct visualization using normal saline as a distending medium.  The uterine cavity was carefully examined, both ostia were recognized.  Hysteroscopic scissors were used to cut the stalk of the higher polyp. Hysteroscopic forceps were used to remove the smaller polyp. The base of the lower polyp was cut. The scope was removed and the larger polyp was grasped with kelly forceps, twisted and removed. The scope was re-introduced and demonstrated an empty endometrial cavity.  The scope was then removed. The tenaculum was removed from the anterior lip of the cervix and the vaginal speculum was removed after noting good hemostasis.  The patient tolerated the procedure well.   There were no immediate complications.  Routine  postoperative instructions given.    Kiki Pelton, MD, FACOG Minimally Invasive Gynecologic Surgery  Obstetrics and Gynecology, [facility], [facility] 04/30/2024

## 2024-05-03 ENCOUNTER — Telehealth: Payer: Self-pay

## 2024-05-03 ENCOUNTER — Other Ambulatory Visit (HOSPITAL_COMMUNITY): Payer: Self-pay

## 2024-05-03 ENCOUNTER — Encounter: Payer: Self-pay | Admitting: Pulmonary Disease

## 2024-05-03 NOTE — Telephone Encounter (Signed)
 Patient has deductible making 3 months $270+, 1 month is $91.00.  Sent to office in original message

## 2024-05-04 LAB — SURGICAL PATHOLOGY

## 2024-05-04 MED ORDER — FLUTICASONE-SALMETEROL 250-50 MCG/ACT IN AEPB: 1.0000 | INHALATION_SPRAY | Freq: Two times a day (BID) | RESPIRATORY_TRACT | 5 refills | Status: DC

## 2024-05-04 NOTE — Addendum Note (Signed)
 Addended by: Leaner Morici on: 05/04/2024 10:39 AM   Modules accepted: Orders

## 2024-05-05 ENCOUNTER — Ambulatory Visit: Payer: Self-pay | Admitting: Obstetrics and Gynecology

## 2024-05-05 NOTE — Telephone Encounter (Signed)
 Called patient, confirmed ID x2. Reviewed pathology results. Reports doing well postprocedure. All questions answered.   Clinical History: PMB, endometrial polyp (cm)  FINAL MICROSCOPIC DIAGNOSIS:   A. ENDOMETRIUM, BIOPSY:  -  Benign endometrial polyp.

## 2024-05-25 DIAGNOSIS — Z79899 Other long term (current) drug therapy: Secondary | ICD-10-CM | POA: Diagnosis not present

## 2024-05-25 DIAGNOSIS — M1991 Primary osteoarthritis, unspecified site: Secondary | ICD-10-CM | POA: Diagnosis not present

## 2024-05-25 DIAGNOSIS — Z6829 Body mass index (BMI) 29.0-29.9, adult: Secondary | ICD-10-CM | POA: Diagnosis not present

## 2024-05-25 DIAGNOSIS — M0579 Rheumatoid arthritis with rheumatoid factor of multiple sites without organ or systems involvement: Secondary | ICD-10-CM | POA: Diagnosis not present

## 2024-05-25 DIAGNOSIS — M1009 Idiopathic gout, multiple sites: Secondary | ICD-10-CM | POA: Diagnosis not present

## 2024-05-25 DIAGNOSIS — E119 Type 2 diabetes mellitus without complications: Secondary | ICD-10-CM | POA: Diagnosis not present

## 2024-05-25 DIAGNOSIS — E663 Overweight: Secondary | ICD-10-CM | POA: Diagnosis not present

## 2024-05-25 DIAGNOSIS — M064 Inflammatory polyarthropathy: Secondary | ICD-10-CM | POA: Diagnosis not present

## 2024-06-01 NOTE — Progress Notes (Signed)
 HPI: Follow-up hypertension and chronic diastolic CHF. Nuclear study September 2024 showed ejection fraction 56% and no ischemia or infarction. Echocardiogram January 2025 showed normal LV function, grade 1 diastolic dysfunction, moderate pulmonary hypertension, mild left atrial enlargement, mild mitral regurgitation. Patient admitted January 2025 with acute hypoxic respiratory failure felt secondary to a combination of pneumonia and CHF. She was treated with antibiotics and diuretics with improvement. She did have acute metabolic encephalopathy which also improved. Note Cardizem  was discontinued because of bradycardia by report. Since last seen, she has some dyspnea on exertion particular without her oxygen .  No orthopnea, PND, pedal edema, chest pain or syncope.  Current Outpatient Medications  Medication Sig Dispense Refill   albuterol  (VENTOLIN  HFA) 108 (90 Base) MCG/ACT inhaler Inhale 2 puffs into the lungs every 6 (six) hours as needed for wheezing or shortness of breath. 8 g 6   allopurinol  (ZYLOPRIM ) 300 MG tablet Take 1 tablet (300 mg total) by mouth daily. 90 tablet 3   Ascorbic Acid  (VITAMIN C ) 1000 MG tablet Take 1,000 mg by mouth daily.     aspirin  EC 81 MG tablet Take 81 mg by mouth daily.     atorvastatin  (LIPITOR) 40 MG tablet Take 1 tablet (40 mg total) by mouth daily. 90 tablet 3   Cholecalciferol  (VITAMIN D ) 2000 units CAPS Take 2,000 Units by mouth daily. OTC     diazepam  (VALIUM ) 5 MG tablet Take 1-2 tablets (5-10 mg total) by mouth every 8 (eight) hours as needed for muscle spasms. 20 tablet 0   fluticasone -salmeterol (ADVAIR DISKUS) 250-50 MCG/ACT AEPB Inhale 1 puff into the lungs in the morning and at bedtime. 60 each 5   glipiZIDE  (GLUCOTROL  XL) 5 MG 24 hr tablet Take 1 tablet (5 mg total) by mouth daily with breakfast. 90 tablet 3   HYDROcodone -acetaminophen  (NORCO) 5-325 MG tablet Take 1 tablet by mouth every 6 (six) hours as needed for moderate pain (pain score 4-6).  120 tablet 0   HYDROcodone -acetaminophen  (NORCO) 5-325 MG tablet Take 1 tablet by mouth every 6 (six) hours as needed for moderate pain (pain score 4-6). 120 tablet 0   HYDROcodone -acetaminophen  (NORCO) 5-325 MG tablet Take 1 tablet by mouth every 6 (six) hours as needed for moderate pain (pain score 4-6). 120 tablet 0   hydroxychloroquine  (PLAQUENIL ) 200 MG tablet Take 200 mg by mouth 2 (two) times daily. Pt reported changed by Rheumatology     LASIX  20 MG tablet Take 4 tablets (80 mg total) by mouth daily. Taking 2 tablets daily     polyethylene glycol powder (GLYCOLAX /MIRALAX ) powder Take 8.5-17 g by mouth at bedtime.      potassium chloride  (KLOR-CON  10) 10 MEQ tablet Take 2 tablets (20 mEq total) by mouth daily.     saccharomyces boulardii (FLORASTOR) 250 MG capsule Take 250 mg by mouth daily.     No current facility-administered medications for this visit.     Past Medical History:  Diagnosis Date   Allergic rhinitis    Anxiety    Arthritis    sees Dr. Jon Jacob    Cellulitis, periorbital 2006   left eye, due to MRSA, saw Dr. Arlana    Chronic kidney disease    sees Dr. Curtis Heman   Complication of anesthesia    COPD (chronic obstructive pulmonary disease) (HCC)    Cough    Diabetes mellitus    Dyspnea    with exertion   GERD (gastroesophageal reflux disease)  OTC  Pepcid    Hemorrhoids    Hyperlipidemia    Hypertension    IBS (irritable bowel syndrome)    Loss of smell    Loss of taste    Memory changes    Menopause    MRSA (methicillin resistant Staphylococcus aureus) 2006   left orbit    Pneumonia    PONV (postoperative nausea and vomiting)    Tubular adenoma of colon 12/2006    Past Surgical History:  Procedure Laterality Date   APPENDECTOMY     BACK SURGERY     COLONOSCOPY  04-01-12   per Dr. Aneita, tubular adenoma, repeat in 5 yrs    CYSTOSCOPY WITH BIOPSY N/A 12/11/2021   Procedure: CYSTOSCOPY WITH BIOPSY;  Surgeon: Elisabeth Valli BIRCH,  MD;  Location: [facility];  Service: Urology;  Laterality: N/A;   CYSTOSCOPY WITH FULGERATION N/A 12/11/2021   Procedure: CYSTOSCOPY WITH FULGERATION;  Surgeon: Elisabeth Valli BIRCH, MD;  Location: [facility];  Service: Urology;  Laterality: N/A;   POLYPECTOMY     TUBAL LIGATION      Social History   Socioeconomic History   Marital status: Married    Spouse name: Tom   Number of children: 1   Years of education: college   Highest education level: Bachelor's degree (e.g., BA, AB, BS)  Occupational History    Employer: RETIRED  Tobacco Use   Smoking status: Every Day    Current packs/day: 0.50    Average packs/day: 0.5 packs/day for 1 year (0.5 ttl pk-yrs)    Types: Cigarettes   Smokeless tobacco: Never  Vaping Use   Vaping status: Never Used  Substance and Sexual Activity   Alcohol use: Not Currently    Comment: noen sinc e2019   Drug use: Never   Sexual activity: Not Currently    Birth control/protection: Post-menopausal  Other Topics Concern   Not on file  Social History Narrative   Patient is right-handed.she lives with her husband in a one level home. She drinks decaf coffee, 2 cups a day. One cup iced teas per day. She goes to the gym 3 x a week and is doing P/T 1 x a week.     Social Drivers of Corporate investment banker Strain: Not on file  Food Insecurity: No Food Insecurity (12/28/2023)   Hunger Vital Sign    Worried About Running Out of Food in the Last Year: Never true    Ran Out of Food in the Last Year: Never true  Transportation Needs: No Transportation Needs (12/28/2023)   PRAPARE - Administrator, Civil Service (Medical): No    Lack of Transportation (Non-Medical): No  Physical Activity: Not on file  Stress: Not on file  Social Connections: Moderately Isolated (12/28/2023)   Social Connection and Isolation Panel    Frequency of Communication with Friends and Family: More than three times a week    Frequency of Social Gatherings with Friends and Family:  Never    Attends Religious Services: Never    Database administrator or Organizations: No    Attends Banker Meetings: Never    Marital Status: Married  Catering manager Violence: Not At Risk (12/28/2023)   Humiliation, Afraid, Rape, and Kick questionnaire    Fear of Current or Ex-Partner: No    Emotionally Abused: No    Physically Abused: No    Sexually Abused: No    Family History  Problem Relation Age of Onset   Alcohol  abuse Mother 11       MVA   Diabetes Mother    Heart disease Father 27       MI   Heart disease Brother    Diabetes Brother    Arthritis Sister    Dementia Maternal Grandfather     ROS: no fevers or chills, productive cough, hemoptysis, dysphasia, odynophagia, melena, hematochezia, dysuria, hematuria, rash, seizure activity, orthopnea, PND, pedal edema, claudication. Remaining systems are negative.  Physical Exam: Well-developed well-nourished in no acute distress.  Skin is warm and dry.  HEENT is normal.  Neck is supple.  Chest is clear to auscultation with normal expansion.  Cardiovascular exam is regular rate and rhythm.  Abdominal exam nontender or distended. No masses palpated. Extremities show no edema. neuro grossly intact   A/P  1 chronic diastolic congestive heart failure-patient remains euvolemic on examination.  Will continue diuretic at present dose.  We discussed SGLT2 inhibitor today but she was hesitant.  She was referred to discuss this with her nephrologist.  2 hypertension-patient's blood pressure is controlled.  Continue present medical regimen.  Note Cardizem  was discontinued in the past due to bradycardia.  3 hyperlipidemia-continue statin.  Redell Shallow, MD

## 2024-06-09 ENCOUNTER — Encounter: Payer: Self-pay | Admitting: Cardiology

## 2024-06-09 ENCOUNTER — Ambulatory Visit: Payer: Medicare Other | Attending: Cardiology | Admitting: Cardiology

## 2024-06-09 VITALS — BP 135/71 | HR 73 | Ht 62.0 in | Wt 170.0 lb

## 2024-06-09 DIAGNOSIS — I5032 Chronic diastolic (congestive) heart failure: Secondary | ICD-10-CM | POA: Insufficient documentation

## 2024-06-09 DIAGNOSIS — I10 Essential (primary) hypertension: Secondary | ICD-10-CM | POA: Insufficient documentation

## 2024-06-09 NOTE — Patient Instructions (Signed)
   Follow-Up: At [facility], you and your health needs are our priority.  As part of our continuing mission to provide you with exceptional heart care, our providers are all part of one team.  This team includes your primary Cardiologist (physician) and Advanced Practice Providers or APPs (Physician Assistants and Nurse Practitioners) who all work together to provide you with the care you need, when you need it.  Your next appointment:   6 month(s)  Provider:   Redell Shallow MD

## 2024-06-11 ENCOUNTER — Encounter: Payer: Self-pay | Admitting: Family Medicine

## 2024-06-11 ENCOUNTER — Ambulatory Visit (INDEPENDENT_AMBULATORY_CARE_PROVIDER_SITE_OTHER): Admitting: Family Medicine

## 2024-06-11 VITALS — BP 120/60 | HR 68 | Temp 97.6°F | Wt 174.0 lb

## 2024-06-11 DIAGNOSIS — G8929 Other chronic pain: Secondary | ICD-10-CM | POA: Diagnosis not present

## 2024-06-11 DIAGNOSIS — M545 Low back pain, unspecified: Secondary | ICD-10-CM | POA: Diagnosis not present

## 2024-06-11 MED ORDER — HYDROCODONE-ACETAMINOPHEN 5-325 MG PO TABS: 1.0000 | ORAL_TABLET | Freq: Four times a day (QID) | ORAL | 0 refills | Status: DC | PRN

## 2024-06-11 NOTE — Progress Notes (Signed)
   Subjective:    Patient ID: Deborah Bryant, female    DOB: [birth year], 78 y.o.   MRN: [id number]  HPI Here for pain management. She is doing well.    Review of Systems  Constitutional: Negative.   Musculoskeletal:  Positive for back pain.       Objective:   Physical Exam Constitutional:      Appearance: Normal appearance.     Comments: Wearing [place] oxygen     Neurological:     Mental Status: She is alert.           Assessment & Plan:  Pain management.  Indication for chronic opioid: low back pain Medication and dose: Norco 5-325 # pills per month: 120 Last UDS date: 08-19-23 Opioid Treatment Agreement signed (Y/N): 03-19-18 Opioid Treatment Agreement last reviewed with patient:  06-11-24 [facility] reviewed this encounter (include red flags): Yes Meds were refilled.  Garnette Olmsted, MD

## 2024-07-05 ENCOUNTER — Encounter: Payer: Self-pay | Admitting: Family Medicine

## 2024-07-07 ENCOUNTER — Encounter: Payer: Self-pay | Admitting: Family Medicine

## 2024-07-07 NOTE — Telephone Encounter (Signed)
The written RX is ready to fax

## 2024-07-07 NOTE — Telephone Encounter (Signed)
 Pt Rx was faxed to [facility]

## 2024-08-04 ENCOUNTER — Ambulatory Visit: Admitting: Pulmonary Disease

## 2024-08-17 DIAGNOSIS — R319 Hematuria, unspecified: Secondary | ICD-10-CM | POA: Diagnosis not present

## 2024-08-17 DIAGNOSIS — Z6831 Body mass index (BMI) 31.0-31.9, adult: Secondary | ICD-10-CM | POA: Diagnosis not present

## 2024-08-17 DIAGNOSIS — I129 Hypertensive chronic kidney disease with stage 1 through stage 4 chronic kidney disease, or unspecified chronic kidney disease: Secondary | ICD-10-CM | POA: Diagnosis not present

## 2024-08-17 DIAGNOSIS — N183 Chronic kidney disease, stage 3 unspecified: Secondary | ICD-10-CM | POA: Diagnosis not present

## 2024-08-19 ENCOUNTER — Encounter: Payer: Self-pay | Admitting: Family Medicine

## 2024-08-19 ENCOUNTER — Ambulatory Visit: Admitting: Family Medicine

## 2024-08-19 VITALS — BP 124/60 | HR 51 | Temp 97.5°F | Wt 176.8 lb

## 2024-08-19 DIAGNOSIS — Z23 Encounter for immunization: Secondary | ICD-10-CM | POA: Diagnosis not present

## 2024-08-19 DIAGNOSIS — I509 Heart failure, unspecified: Secondary | ICD-10-CM

## 2024-08-19 DIAGNOSIS — N189 Chronic kidney disease, unspecified: Secondary | ICD-10-CM

## 2024-08-19 DIAGNOSIS — I10 Essential (primary) hypertension: Secondary | ICD-10-CM

## 2024-08-19 DIAGNOSIS — M1380 Other specified arthritis, unspecified site: Secondary | ICD-10-CM

## 2024-08-19 DIAGNOSIS — E782 Mixed hyperlipidemia: Secondary | ICD-10-CM

## 2024-08-19 DIAGNOSIS — M545 Low back pain, unspecified: Secondary | ICD-10-CM

## 2024-08-19 DIAGNOSIS — E1122 Type 2 diabetes mellitus with diabetic chronic kidney disease: Secondary | ICD-10-CM

## 2024-08-19 DIAGNOSIS — M1000 Idiopathic gout, unspecified site: Secondary | ICD-10-CM

## 2024-08-19 DIAGNOSIS — I130 Hypertensive heart and chronic kidney disease with heart failure and stage 1 through stage 4 chronic kidney disease, or unspecified chronic kidney disease: Secondary | ICD-10-CM

## 2024-08-19 DIAGNOSIS — E559 Vitamin D deficiency, unspecified: Secondary | ICD-10-CM

## 2024-08-19 DIAGNOSIS — J449 Chronic obstructive pulmonary disease, unspecified: Secondary | ICD-10-CM | POA: Diagnosis not present

## 2024-08-19 DIAGNOSIS — G8929 Other chronic pain: Secondary | ICD-10-CM

## 2024-08-19 DIAGNOSIS — E119 Type 2 diabetes mellitus without complications: Secondary | ICD-10-CM

## 2024-08-19 LAB — LIPID PANEL
Cholesterol: 140 mg/dL (ref 0–200)
HDL: 91.7 mg/dL (ref >39.00)
LDL Cholesterol: 40 mg/dL (ref 0–99)
NonHDL: 48.33
Total CHOL/HDL Ratio: 2
Triglycerides: 41 mg/dL (ref 0.0–149.0)
VLDL: 8.2 mg/dL (ref 0.0–40.0)

## 2024-08-19 LAB — BASIC METABOLIC PANEL WITH GFR
BUN: 25 mg/dL — ABNORMAL HIGH (ref 6–23)
CO2: 29 meq/L (ref 19–32)
Calcium: 9.4 mg/dL (ref 8.4–10.5)
Chloride: 98 meq/L (ref 96–112)
Creatinine, Ser: 0.99 mg/dL (ref 0.40–1.20)
GFR: 54.81 mL/min — ABNORMAL LOW (ref >60.00)
Glucose, Bld: 105 mg/dL — ABNORMAL HIGH (ref 70–99)
Potassium: 3.6 meq/L (ref 3.5–5.1)
Sodium: 137 meq/L (ref 135–145)

## 2024-08-19 LAB — URIC ACID: Uric Acid, Serum: 3.4 mg/dL (ref 2.4–7.0)

## 2024-08-19 LAB — CBC WITH DIFFERENTIAL/PLATELET
Basophils Absolute: 0.1 K/uL (ref 0.0–0.1)
Basophils Relative: 0.8 % (ref 0.0–3.0)
Eosinophils Absolute: 0.1 K/uL (ref 0.0–0.7)
Eosinophils Relative: 1.1 % (ref 0.0–5.0)
HCT: 37.3 % (ref 36.0–46.0)
Hemoglobin: 12.1 g/dL (ref 12.0–15.0)
Lymphocytes Relative: 9.8 % — ABNORMAL LOW (ref 12.0–46.0)
Lymphs Abs: 0.8 K/uL (ref 0.7–4.0)
MCHC: 32.3 g/dL (ref 30.0–36.0)
MCV: 91.1 fl (ref 78.0–100.0)
Monocytes Absolute: 0.6 K/uL (ref 0.1–1.0)
Monocytes Relative: 7 % (ref 3.0–12.0)
Neutro Abs: 6.6 K/uL (ref 1.4–7.7)
Neutrophils Relative %: 81.3 % — ABNORMAL HIGH (ref 43.0–77.0)
Platelets: 270 K/uL (ref 150.0–400.0)
RBC: 4.1 Mil/uL (ref 3.87–5.11)
RDW: 16.8 % — ABNORMAL HIGH (ref 11.5–15.5)
WBC: 8.1 K/uL (ref 4.0–10.5)

## 2024-08-19 LAB — HEPATIC FUNCTION PANEL
ALT: 11 U/L (ref 0–35)
AST: 20 U/L (ref 0–37)
Albumin: 4.2 g/dL (ref 3.5–5.2)
Alkaline Phosphatase: 166 U/L — ABNORMAL HIGH (ref 39–117)
Bilirubin, Direct: 0.2 mg/dL (ref 0.0–0.3)
Total Bilirubin: 0.5 mg/dL (ref 0.2–1.2)
Total Protein: 7.6 g/dL (ref 6.0–8.3)

## 2024-08-19 LAB — TSH: TSH: 4.69 u[IU]/mL (ref 0.35–5.50)

## 2024-08-19 LAB — VITAMIN D 25 HYDROXY (VIT D DEFICIENCY, FRACTURES): VITD: 36.64 ng/mL (ref 30.00–100.00)

## 2024-08-19 LAB — HEMOGLOBIN A1C: Hgb A1c MFr Bld: 6.7 % — ABNORMAL HIGH (ref 4.6–6.5)

## 2024-08-19 MED ORDER — GLIPIZIDE ER 5 MG PO TB24: 5.0000 mg | ORAL_TABLET | Freq: Every day | ORAL | 3 refills | Status: DC

## 2024-08-19 MED ORDER — ATORVASTATIN CALCIUM 40 MG PO TABS: 40.0000 mg | ORAL_TABLET | Freq: Every day | ORAL | 3 refills | Status: AC

## 2024-08-19 MED ORDER — ALLOPURINOL 300 MG PO TABS: 300.0000 mg | ORAL_TABLET | Freq: Every day | ORAL | 3 refills | Status: AC

## 2024-08-19 MED ORDER — DIAZEPAM 5 MG PO TABS: 5.0000 mg | ORAL_TABLET | Freq: Three times a day (TID) | ORAL | 5 refills | Status: AC | PRN

## 2024-08-19 NOTE — Addendum Note (Signed)
 Addended by: LADONNA INOCENTE SAILOR on: 08/19/2024 11:09 AM   Modules accepted: Orders

## 2024-08-19 NOTE — Progress Notes (Signed)
 Subjective:    Patient ID: Deborah Bryant, female    DOB: 1946/10/07, 78 y.o.   MRN: [id number]  HPI Here to follow up on issues. She has no specific complaints today. She sees Dr. Kara for COPD and she wears continuous oxygen . She sees Dr. Pietro for CHF and HTN. She sees Dr. Ishmael for arthritis. She sees Dr. Darden for CKD. She always has some baseline SOB, but no recent coughing. She is in pain management with us  for low back pain. Her nephew drove her today since she no longer drives herself.    Review of Systems  Constitutional: Negative.   HENT: Negative.    Eyes: Negative.   Respiratory:  Positive for shortness of breath.   Cardiovascular: Negative.   Gastrointestinal: Negative.   Genitourinary:  Negative for decreased urine volume, difficulty urinating, dyspareunia, dysuria, enuresis, flank pain, frequency, hematuria, pelvic pain and urgency.  Musculoskeletal:  Positive for arthralgias and back pain.  Skin: Negative.   Neurological: Negative.  Negative for headaches.  Psychiatric/Behavioral: Negative.         Objective:   Physical Exam Constitutional:      General: She is not in acute distress.    Appearance: Normal appearance. She is well-developed.     Comments: Wearing [place] oxygen    HENT:     Head: Normocephalic and atraumatic.     Right Ear: External ear normal.     Left Ear: External ear normal.     Nose: Nose normal.     Mouth/Throat:     Pharynx: No oropharyngeal exudate.  Eyes:     General: No scleral icterus.    Conjunctiva/sclera: Conjunctivae normal.     Pupils: Pupils are equal, round, and reactive to light.  Neck:     Thyroid : No thyromegaly.     Vascular: No JVD.  Cardiovascular:     Rate and Rhythm: Normal rate and regular rhythm.     Heart sounds: Normal heart sounds. No murmur heard.    No friction rub. No gallop.  Pulmonary:     Effort: Pulmonary effort is normal. No respiratory distress.     Breath sounds: Normal breath sounds. No  wheezing or rales.  Chest:     Chest wall: No tenderness.  Abdominal:     General: Bowel sounds are normal. There is no distension.     Palpations: Abdomen is soft. There is no mass.     Tenderness: There is no abdominal tenderness. There is no guarding or rebound.  Musculoskeletal:        General: No tenderness. Normal range of motion.     Cervical back: Normal range of motion and neck supple.     Comments: Trace edema in both lower legs  Lymphadenopathy:     Cervical: No cervical adenopathy.  Skin:    General: Skin is warm and dry.     Findings: No erythema or rash.  Neurological:     Mental Status: She is alert and oriented to person, place, and time.     Cranial Nerves: No cranial nerve deficit.     Motor: No abnormal muscle tone.     Coordination: Coordination normal.     Deep Tendon Reflexes: Reflexes are normal and symmetric. Reflexes normal.  Psychiatric:        Behavior: Behavior normal.        Thought Content: Thought content normal.        Judgment: Judgment normal.  Assessment & Plan:  Her COPD is stable and she remains oxygen  dependent. Her HTN and CHF are well controlled. Her arthritis and low back pain are stable. Her CKD is stable. We will get labs to check lipids and an A1c for the type 2 diabetes. Check uric acid for gout. We spent a total of (34   ) minutes reviewing records and discussing these issues.  Garnette Olmsted, MD

## 2024-08-20 ENCOUNTER — Ambulatory Visit: Payer: Self-pay | Admitting: Family Medicine

## 2024-08-21 LAB — DRUG MONITORING, PANEL 8 WITH CONFIRMATION, URINE
6 Acetylmorphine: NEGATIVE ng/mL (ref <10)
Alcohol Metabolites: NEGATIVE ng/mL (ref <500)
Alphahydroxyalprazolam: NEGATIVE ng/mL (ref <25)
Alphahydroxymidazolam: NEGATIVE ng/mL (ref <50)
Alphahydroxytriazolam: NEGATIVE ng/mL (ref <50)
Aminoclonazepam: NEGATIVE ng/mL (ref <25)
Amphetamines: NEGATIVE ng/mL (ref <500)
Benzodiazepines: POSITIVE ng/mL — AB (ref <100)
Buprenorphine, Urine: NEGATIVE ng/mL (ref <5)
Buprenorphine: NEGATIVE ng/mL (ref <2)
Cocaine Metabolite: NEGATIVE ng/mL (ref <150)
Codeine: NEGATIVE ng/mL (ref <50)
Creatinine: 61.1 mg/dL (ref >=20.0)
Hydrocodone: 848 ng/mL — ABNORMAL HIGH (ref <50)
Hydromorphone: 495 ng/mL — ABNORMAL HIGH (ref <50)
Hydroxyethylflurazepam: NEGATIVE ng/mL (ref <50)
Lorazepam: NEGATIVE ng/mL (ref <50)
MDMA: NEGATIVE ng/mL (ref <500)
Marijuana Metabolite: NEGATIVE ng/mL (ref <20)
Morphine: NEGATIVE ng/mL (ref <50)
Naloxone: NEGATIVE ng/mL (ref <2)
Norbuprenorphine: NEGATIVE ng/mL (ref <2)
Nordiazepam: 530 ng/mL — ABNORMAL HIGH (ref <50)
Norhydrocodone: 2101 ng/mL — ABNORMAL HIGH (ref <50)
Opiates: POSITIVE ng/mL — AB (ref <100)
Oxazepam: 756 ng/mL — ABNORMAL HIGH (ref <50)
Oxidant: NEGATIVE ug/mL (ref <200)
Oxycodone: NEGATIVE ng/mL (ref <100)
Temazepam: 806 ng/mL — ABNORMAL HIGH (ref <50)
pH: 6.3 (ref 4.5–9.0)

## 2024-08-21 LAB — DM TEMPLATE

## 2024-09-01 ENCOUNTER — Telehealth: Payer: Self-pay

## 2024-09-01 NOTE — Telephone Encounter (Signed)
 Copied from CRM [id number]. Topic: General - Other >> Sep 01, 2024 10:33 AM Burnard DEL wrote: Reason for CRM: [facility] called to check on status of office notes being faxed to them.They sent a request over on 08/19/2024.

## 2024-09-02 NOTE — Telephone Encounter (Signed)
 Pt last office notes faxed to [facility] for pt Oxygen 

## 2024-09-10 ENCOUNTER — Ambulatory Visit (INDEPENDENT_AMBULATORY_CARE_PROVIDER_SITE_OTHER): Admitting: Pulmonary Disease

## 2024-09-10 ENCOUNTER — Encounter: Payer: Self-pay | Admitting: Pulmonary Disease

## 2024-09-10 VITALS — BP 154/64 | HR 66 | Ht 63.0 in | Wt 176.0 lb

## 2024-09-10 DIAGNOSIS — J449 Chronic obstructive pulmonary disease, unspecified: Secondary | ICD-10-CM | POA: Diagnosis not present

## 2024-09-10 DIAGNOSIS — J9611 Chronic respiratory failure with hypoxia: Secondary | ICD-10-CM

## 2024-09-10 MED ORDER — STIOLTO RESPIMAT 2.5-2.5 MCG/ACT IN AERS: 2.0000 | INHALATION_SPRAY | Freq: Every day | RESPIRATORY_TRACT | 5 refills | Status: AC

## 2024-09-10 NOTE — Progress Notes (Signed)
 Synopsis: Referred in May 2025 for COPD  Subjective:   PATIENT ID: Deborah Bryant Lunger GENDER: female DOB: 07-May-1946, MRN: [id number]   HPI  Chief Complaint  Patient presents with   Medical Management of Chronic Issues    Pt states stopped using advair causing irritation in mouth    Deborah Bryant is a 78 year old woman, daily smoker with GERD, CKD, CHF, rheumatoid arthritis and DMII who returns to pulmonary clinic for COPD.   OV 04/27/24 She was admitted 1/12 to 1/18 for pneumonia and acute hypoxemic respiratory failure. She was started on 1-2L of oxygen  at that time.   She is not currently using any inhalers. She does have exertional dyspnea.  She has quit smoking since her hospitalization.   OV 09/10/24 Mouth irritation and gum swelling began after using Advair for COPD, affecting the front gums with significant swelling and pain. Symptoms disrupted sleep a week ago, leading her to stop the medication, which improved the symptoms.  Advair was effective in reducing coughing and wheezing. Since discontinuing it a week ago, she notices a slight increase in coughing but no worsening of wheezing. She is concerned about the cost of alternative medications due to inadequate insurance coverage.  She uses a concentrator for oxygen  at night, set at two and a half liters, and turns it off during the day for activities. She feels stable without it during these times and has not experienced shortness of breath or nocturnal breathing issues.   Past Medical History:  Diagnosis Date   Allergic rhinitis    Anxiety    Arthritis    sees Dr. Jon Jacob    Cellulitis, periorbital 2006   left eye, due to MRSA, saw Dr. Arlana    Chronic kidney disease    sees Dr. Curtis Heman   Complication of anesthesia    COPD (chronic obstructive pulmonary disease) (HCC)    Cough    Diabetes mellitus    Dyspnea    with exertion   GERD (gastroesophageal reflux disease)    OTC  Pepcid    Hemorrhoids     Hyperlipidemia    Hypertension    IBS (irritable bowel syndrome)    Loss of smell    Loss of taste    Memory changes    Menopause    MRSA (methicillin resistant Staphylococcus aureus) 2006   left orbit    Pneumonia    PONV (postoperative nausea and vomiting)    Tubular adenoma of colon 12/2006     Family History  Problem Relation Age of Onset   Alcohol abuse Mother 68       MVA   Diabetes Mother    Heart disease Father 54       MI   Heart disease Brother    Diabetes Brother    Arthritis Sister    Dementia Maternal Grandfather      Social History   Socioeconomic History   Marital status: Married    Spouse name: Tom   Number of children: 1   Years of education: college   Highest education level: Bachelor's degree (e.g., BA, AB, BS)  Occupational History    Employer: RETIRED  Tobacco Use   Smoking status: Every Day    Current packs/day: 0.50    Average packs/day: 0.5 packs/day for 1 year (0.5 ttl pk-yrs)    Types: Cigarettes   Smokeless tobacco: Never  Vaping Use   Vaping status: Never Used  Substance and Sexual Activity   Alcohol use:  Not Currently    Comment: noen sinc e2019   Drug use: Never   Sexual activity: Not Currently    Birth control/protection: Post-menopausal  Other Topics Concern   Not on file  Social History Narrative   Patient is right-handed.she lives with her husband in a one level home. She drinks decaf coffee, 2 cups a day. One cup iced teas per day. She goes to the gym 3 x a week and is doing P/T 1 x a week.     Social Drivers of Corporate investment banker Strain: Not on file  Food Insecurity: No Food Insecurity (12/28/2023)   Hunger Vital Sign    Worried About Running Out of Food in the Last Year: Never true    Ran Out of Food in the Last Year: Never true  Transportation Needs: No Transportation Needs (12/28/2023)   PRAPARE - Administrator, Civil Service (Medical): No    Lack of Transportation (Non-Medical): No   Physical Activity: Not on file  Stress: Not on file  Social Connections: Moderately Isolated (12/28/2023)   Social Connection and Isolation Panel    Frequency of Communication with Friends and Family: More than three times a week    Frequency of Social Gatherings with Friends and Family: Never    Attends Religious Services: Never    Database administrator or Organizations: No    Attends Banker Meetings: Never    Marital Status: Married  Catering manager Violence: Not At Risk (12/28/2023)   Humiliation, Afraid, Rape, and Kick questionnaire    Fear of Current or Ex-Partner: No    Emotionally Abused: No    Physically Abused: No    Sexually Abused: No     Allergies  Allergen Reactions   Losartan  Shortness Of Breath   Benazepril  Cough   Lasix  [Furosemide ] Other (See Comments)    Rash, changes in eyesight   Levaquin [Levofloxacin] Other (See Comments)    Tendon and muscle pain   Phenobarbital Nausea And Vomiting     Outpatient Medications Prior to Visit  Medication Sig Dispense Refill   albuterol  (VENTOLIN  HFA) 108 (90 Base) MCG/ACT inhaler Inhale 2 puffs into the lungs every 6 (six) hours as needed for wheezing or shortness of breath. 8 g 6   allopurinol  (ZYLOPRIM ) 300 MG tablet Take 1 tablet (300 mg total) by mouth daily. 90 tablet 3   Ascorbic Acid  (VITAMIN C ) 1000 MG tablet Take 1,000 mg by mouth daily.     aspirin  EC 81 MG tablet Take 81 mg by mouth daily.     atorvastatin  (LIPITOR) 40 MG tablet Take 1 tablet (40 mg total) by mouth daily. 90 tablet 3   Cholecalciferol  (VITAMIN D ) 2000 units CAPS Take 2,000 Units by mouth daily. OTC     diazepam  (VALIUM ) 5 MG tablet Take 1 tablet (5 mg total) by mouth every 8 (eight) hours as needed for muscle spasms. 90 tablet 5   glipiZIDE  (GLUCOTROL  XL) 5 MG 24 hr tablet Take 1 tablet (5 mg total) by mouth daily with breakfast. 90 tablet 3   HYDROcodone -acetaminophen  (NORCO) 5-325 MG tablet Take 1 tablet by mouth every 6 (six)  hours as needed for moderate pain (pain score 4-6). 120 tablet 0   HYDROcodone -acetaminophen  (NORCO) 5-325 MG tablet Take 1 tablet by mouth every 6 (six) hours as needed for moderate pain (pain score 4-6). 120 tablet 0   HYDROcodone -acetaminophen  (NORCO) 5-325 MG tablet Take 1 tablet by mouth every 6 (  six) hours as needed for moderate pain (pain score 4-6). 120 tablet 0   hydroxychloroquine  (PLAQUENIL ) 200 MG tablet Take 200 mg by mouth 2 (two) times daily. Pt reported changed by Rheumatology     LASIX  20 MG tablet Take 4 tablets (80 mg total) by mouth daily. Taking 2 tablets daily     polyethylene glycol powder (GLYCOLAX /MIRALAX ) powder Take 8.5-17 g by mouth at bedtime.      potassium chloride  (KLOR-CON  10) 10 MEQ tablet Take 2 tablets (20 mEq total) by mouth daily.     saccharomyces boulardii (FLORASTOR) 250 MG capsule Take 250 mg by mouth daily.     fluticasone -salmeterol (ADVAIR DISKUS) 250-50 MCG/ACT AEPB Inhale 1 puff into the lungs in the morning and at bedtime. (Patient not taking: Reported on 09/10/2024) 60 each 5   No facility-administered medications prior to visit.    Review of Systems  Constitutional:  Negative for chills, fever, malaise/fatigue and weight loss.  HENT:  Negative for congestion, sinus pain and sore throat.   Eyes: Negative.   Respiratory:  Positive for cough, shortness of breath and wheezing. Negative for hemoptysis and sputum production.   Cardiovascular:  Negative for chest pain, palpitations, orthopnea, claudication and leg swelling.  Gastrointestinal:  Negative for abdominal pain, heartburn, nausea and vomiting.  Genitourinary: Negative.   Musculoskeletal:  Negative for joint pain and myalgias.  Skin:  Negative for rash.  Neurological:  Negative for weakness.  Endo/Heme/Allergies: Negative.   Psychiatric/Behavioral: Negative.     Objective:   Vitals:   09/10/24 0938  BP: (!) 154/64  Pulse: 66  SpO2: 95%  Weight: 176 lb (79.8 kg)  Height: 5' 3 (1.6  m)    Physical Exam Constitutional:      General: She is not in acute distress.    Appearance: Normal appearance.  Eyes:     General: No scleral icterus.    Conjunctiva/sclera: Conjunctivae normal.  Cardiovascular:     Rate and Rhythm: Normal rate and regular rhythm.  Pulmonary:     Breath sounds: No wheezing, rhonchi or rales.  Musculoskeletal:     Right lower leg: No edema.     Left lower leg: No edema.  Skin:    General: Skin is warm and dry.  Neurological:     General: No focal deficit present.       CBC    Component Value Date/Time   WBC 8.1 08/19/2024 1041   RBC 4.10 08/19/2024 1041   HGB 12.1 08/19/2024 1041   HCT 37.3 08/19/2024 1041   PLT 270.0 08/19/2024 1041   MCV 91.1 08/19/2024 1041   MCH 29.3 03/26/2024 1319   MCHC 32.3 08/19/2024 1041   RDW 16.8 (H) 08/19/2024 1041   LYMPHSABS 0.8 08/19/2024 1041   MONOABS 0.6 08/19/2024 1041   EOSABS 0.1 08/19/2024 1041   BASOSABS 0.1 08/19/2024 1041      Latest Ref Rng & Units 08/19/2024   10:41 AM 03/26/2024    1:19 PM 01/21/2024   11:14 AM  BMP  Glucose 70 - 99 mg/dL 894  98  887   BUN 6 - 23 mg/dL 25  26  16    Creatinine 0.40 - 1.20 mg/dL 9.00  8.93  8.90   BUN/Creat Ratio 12 - 28   15   Sodium 135 - 145 mEq/L 137  138  139   Potassium 3.5 - 5.1 mEq/L 3.6  3.6  4.3   Chloride 96 - 112 mEq/L 98  100  100  CO2 19 - 32 mEq/L 29  25  19    Calcium  8.4 - 10.5 mg/dL 9.4  9.7  9.6    Chest imaging: CXR 02/13/24 Improved aeration from the prior, with currently no definite evidence of acute cardiopulmonary disease  PFT:     No data to display          Labs:  Path:  Echo:  Heart Catheterization:    Assessment & Plan:   Chronic obstructive pulmonary disease, unspecified COPD type (HCC)  Discussion: Amora Deborah Bryant is a 78 year old woman, daily smoker with GERD, CKD, CHF, rheumatoid arthritis and DMII who is referred to pulmonary clinic for COPD.   COPD Chronic Hypoxemic Respiratory Failure -  start stiolto inhaler 2 puffs daily - avoid inhaled steroids due to thrush - Use albuterol  inhaler 1-2 puffs every 4-6 hours as needed - continue supplemental oxygen  - walk test today confirms ongoing need for oxygen  - check ONO on 2-3L of oxygen  - PFTs ordered  Follow up in 6 months  Dorn Chill, MD [facility] Office: [phone]    Current Outpatient Medications:    albuterol  (VENTOLIN  HFA) 108 (90 Base) MCG/ACT inhaler, Inhale 2 puffs into the lungs every 6 (six) hours as needed for wheezing or shortness of breath., Disp: 8 g, Rfl: 6   allopurinol  (ZYLOPRIM ) 300 MG tablet, Take 1 tablet (300 mg total) by mouth daily., Disp: 90 tablet, Rfl: 3   Ascorbic Acid  (VITAMIN C ) 1000 MG tablet, Take 1,000 mg by mouth daily., Disp: , Rfl:    aspirin  EC 81 MG tablet, Take 81 mg by mouth daily., Disp: , Rfl:    atorvastatin  (LIPITOR) 40 MG tablet, Take 1 tablet (40 mg total) by mouth daily., Disp: 90 tablet, Rfl: 3   Cholecalciferol  (VITAMIN D ) 2000 units CAPS, Take 2,000 Units by mouth daily. OTC, Disp: , Rfl:    diazepam  (VALIUM ) 5 MG tablet, Take 1 tablet (5 mg total) by mouth every 8 (eight) hours as needed for muscle spasms., Disp: 90 tablet, Rfl: 5   glipiZIDE  (GLUCOTROL  XL) 5 MG 24 hr tablet, Take 1 tablet (5 mg total) by mouth daily with breakfast., Disp: 90 tablet, Rfl: 3   HYDROcodone -acetaminophen  (NORCO) 5-325 MG tablet, Take 1 tablet by mouth every 6 (six) hours as needed for moderate pain (pain score 4-6)., Disp: 120 tablet, Rfl: 0   HYDROcodone -acetaminophen  (NORCO) 5-325 MG tablet, Take 1 tablet by mouth every 6 (six) hours as needed for moderate pain (pain score 4-6)., Disp: 120 tablet, Rfl: 0   HYDROcodone -acetaminophen  (NORCO) 5-325 MG tablet, Take 1 tablet by mouth every 6 (six) hours as needed for moderate pain (pain score 4-6)., Disp: 120 tablet, Rfl: 0   hydroxychloroquine  (PLAQUENIL ) 200 MG tablet, Take 200 mg by mouth 2 (two) times daily. Pt  reported changed by Rheumatology, Disp: , Rfl:    LASIX  20 MG tablet, Take 4 tablets (80 mg total) by mouth daily. Taking 2 tablets daily, Disp: , Rfl:    polyethylene glycol powder (GLYCOLAX /MIRALAX ) powder, Take 8.5-17 g by mouth at bedtime. , Disp: , Rfl:    potassium chloride  (KLOR-CON  10) 10 MEQ tablet, Take 2 tablets (20 mEq total) by mouth daily., Disp: , Rfl:    saccharomyces boulardii (FLORASTOR) 250 MG capsule, Take 250 mg by mouth daily., Disp: , Rfl:    fluticasone -salmeterol (ADVAIR DISKUS) 250-50 MCG/ACT AEPB, Inhale 1 puff into the lungs in the morning and at bedtime. (Patient not taking: Reported on 09/10/2024),  Disp: 60 each, Rfl: 5

## 2024-09-10 NOTE — Patient Instructions (Addendum)
 Start stiolto inhaler 2 puffs daily  Stop advair inhaler  Continue to use albuterol  inhaler 1-2 puffs every 4-6 hours as needed  We will schedule you for PFTs at follow up  We will schedule you for overnight oxygen  test on 2-3L of oxygen   Follow up in 6 months

## 2024-09-14 ENCOUNTER — Ambulatory Visit: Admitting: Family Medicine

## 2024-09-14 ENCOUNTER — Encounter: Payer: Self-pay | Admitting: Family Medicine

## 2024-09-14 VITALS — BP 104/58 | HR 59 | Temp 97.5°F | Wt 175.8 lb

## 2024-09-14 DIAGNOSIS — M545 Low back pain, unspecified: Secondary | ICD-10-CM

## 2024-09-14 DIAGNOSIS — G8929 Other chronic pain: Secondary | ICD-10-CM | POA: Diagnosis not present

## 2024-09-14 MED ORDER — HYDROCODONE-ACETAMINOPHEN 5-325 MG PO TABS: 1.0000 | ORAL_TABLET | Freq: Four times a day (QID) | ORAL | 0 refills | Status: DC | PRN

## 2024-09-14 NOTE — Progress Notes (Signed)
   Subjective:    Patient ID: Deborah Bryant, female    DOB: 1946/09/26, 78 y.o.   MRN: [id number]  HPI Here for pain management. She is doing about the same with her back pain and OA.   Review of Systems  Constitutional: Negative.   Musculoskeletal:  Positive for arthralgias and back pain.       Objective:   Physical Exam Constitutional:      Appearance: Normal appearance.  Neurological:     Mental Status: She is alert.           Assessment & Plan:  Pain management.  Indication for chronic opioid: low back pain  Medication and dose: Norco 5-325 # pills per month: 120 Last UDS date: 08-19-24 Opioid Treatment Agreement signed (Y/N): 03-19-18 Opioid Treatment Agreement last reviewed with patient:  09-14-24 [facility] reviewed this encounter (include red flags): Yes Meds were refilled.  Garnette Olmsted, MD

## 2024-09-22 ENCOUNTER — Other Ambulatory Visit: Payer: Self-pay

## 2024-09-22 ENCOUNTER — Encounter: Payer: Self-pay | Admitting: Orthopedic Surgery

## 2024-09-22 ENCOUNTER — Ambulatory Visit: Admitting: Orthopedic Surgery

## 2024-09-22 DIAGNOSIS — M1711 Unilateral primary osteoarthritis, right knee: Secondary | ICD-10-CM

## 2024-09-22 DIAGNOSIS — M25561 Pain in right knee: Secondary | ICD-10-CM

## 2024-09-22 DIAGNOSIS — M170 Bilateral primary osteoarthritis of knee: Secondary | ICD-10-CM | POA: Diagnosis not present

## 2024-09-22 MED ORDER — BUPIVACAINE HCL 0.25 % IJ SOLN
4.0000 mL | INTRAMUSCULAR | Status: AC | PRN
  Administered 2024-09-22: 4 mL via INTRA_ARTICULAR

## 2024-09-22 MED ORDER — TRIAMCINOLONE ACETONIDE 40 MG/ML IJ SUSP
40.0000 mg | INTRAMUSCULAR | Status: AC | PRN
  Administered 2024-09-22: 40 mg via INTRA_ARTICULAR

## 2024-09-22 MED ORDER — LIDOCAINE HCL 1 % IJ SOLN
5.0000 mL | INTRAMUSCULAR | Status: AC | PRN
  Administered 2024-09-22: 5 mL

## 2024-09-22 NOTE — Progress Notes (Signed)
 Office Visit Note   Patient: Deborah Bryant           Date of Birth: [birth year]           MRN: [id number] Visit Date: 09/22/2024 Requested by: Johnny Garnette LABOR, MD [address] [place],  [place] [address] PCP: Johnny Garnette LABOR, MD  Subjective: Chief Complaint  Patient presents with   Right Knee - Pain    Wants cortisone injection and is also requesting xrays today    HPI: LEONILA SPERANZA is a 78 y.o. female who presents to the office reporting right knee pain.  Patient has had CHF pneumonia and is on O2 since her last cortisone injection a year ago.  Cortisone injections have typically helped her significantly in the past..                ROS: All systems reviewed are negative as they relate to the chief complaint within the history of present illness.  Patient denies fevers or chills.  Assessment & Plan: Visit Diagnoses:  1. Right knee pain, unspecified chronicity     Plan: Impression is right knee arthritis little bit worse on the lateral side.  Trace effusion today.  Aspiration injection performed.  When it starts to wear off we will preapproved for for gel shot and we can consider which to do when she comes back. This patient is diagnosed with osteoarthritis of the knee(s).    Radiographs show evidence of joint space narrowing, osteophytes, subchondral sclerosis and/or subchondral cysts.  This patient has knee pain which interferes with functional and activities of daily living.    This patient has experienced inadequate response, adverse effects and/or intolerance with conservative treatments such as acetaminophen , NSAIDS, topical creams, physical therapy or regular exercise, knee bracing and/or weight loss.   This patient has experienced inadequate response or has a contraindication to intra articular steroid injections for at least 3 months.   This patient is not scheduled to have a total knee replacement within 6 months of starting treatment with  viscosupplementation.   Follow-Up Instructions: No follow-ups on file.   Orders:  Orders Placed This Encounter  Procedures   XR Knee 1-2 Views Right   No orders of the defined types were placed in this encounter.     Procedures: Large Joint Inj: R knee on 09/22/2024 8:29 PM Indications: diagnostic evaluation, joint swelling and pain Details: 18 G 1.5 in needle, superolateral approach  Arthrogram: No  Medications: 5 mL lidocaine  1 %; 4 mL bupivacaine  0.25 %; 40 mg triamcinolone acetonide 40 MG/ML Outcome: tolerated well, no immediate complications Procedure, treatment alternatives, risks and benefits explained, specific risks discussed. Consent was given by the patient. Immediately prior to procedure a time out was called to verify the correct patient, procedure, equipment, support staff and site/side marked as required. Patient was prepped and draped in the usual sterile fashion.       Clinical Data: No additional findings.  Objective: Vital Signs: There were no vitals taken for this visit.  Physical Exam:  Constitutional: Patient appears well-developed HEENT:  Head: Normocephalic Eyes:EOM are normal Neck: Normal range of motion Cardiovascular: Normal rate Pulmonary/chest: Effort normal Neurologic: Patient is alert Skin: Skin is warm Psychiatric: Patient has normal mood and affect  Ortho Exam: Ortho exam demonstrates full extension and flexion to about 115.  Trace effusion present.  Extensor mechanism intact.  Mild patellofemoral crepitus is present.  Pedal pulses palpable.  Specialty Comments:  No specialty comments available.  Imaging: No results found.   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   Confusion 11/01/2019   Anosmia 11/01/2019   Ageusia 11/01/2019   Loss of smell 02/02/2019   Loss of taste 02/02/2019   Degenerative spondylolisthesis 09/29/2018   Chronic cough 09/09/2018   Low back pain 06/09/2018   Shoulder pain, bilateral 03/13/2018   Acute renal failure superimposed on stage 2 chronic kidney disease 07/03/2017   IBS (irritable bowel syndrome) 07/03/2017   Vitamin D  deficiency 08/01/2015   Mixed simple and mucopurulent chronic bronchitis (HCC) 06/11/2015   Chorioretinal scar, 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 Jon Jacob    Cellulitis, periorbital 2006   left eye, due to MRSA, saw Dr. Arlana    Chronic kidney disease    sees Dr. Curtis Heman   Complication of anesthesia    COPD (chronic obstructive pulmonary disease) (HCC)    Cough    Diabetes mellitus    Dyspnea    with exertion   GERD (gastroesophageal reflux disease)    OTC  Pepcid    Hemorrhoids    Hyperlipidemia    Hypertension    IBS (irritable bowel syndrome)    Loss of smell    Loss of taste    Memory changes    Menopause    MRSA (methicillin resistant Staphylococcus aureus) 2006   left orbit    Pneumonia    PONV (postoperative nausea and vomiting)    Tubular adenoma of colon 12/2006    Family History  Problem Relation Age of Onset   Alcohol abuse Mother 30       MVA    Diabetes Mother    Heart disease Father 26       MI   Heart disease Brother    Diabetes Brother    Arthritis Sister    Dementia Maternal Grandfather     Past Surgical History:  Procedure Laterality Date   APPENDECTOMY     BACK SURGERY     COLONOSCOPY  04-01-12   per Dr. Aneita, tubular adenoma, repeat in 5 yrs    CYSTOSCOPY WITH BIOPSY N/A 12/11/2021   Procedure: CYSTOSCOPY WITH BIOPSY;  Surgeon: Elisabeth Valli BIRCH, MD;  Location: [facility];  Service: Urology;  Laterality: N/A;   CYSTOSCOPY WITH FULGERATION N/A 12/11/2021   Procedure: CYSTOSCOPY WITH FULGERATION;  Surgeon: Elisabeth Valli BIRCH, MD;  Location: [facility];  Service: Urology;  Laterality: N/A;   POLYPECTOMY     TUBAL LIGATION     Social History   Occupational History    Employer: RETIRED  Tobacco Use   Smoking status: Every Day    Current packs/day: 0.50  Average packs/day: 0.5 packs/day for 1 year (0.5 ttl pk-yrs)    Types: Cigarettes   Smokeless tobacco: Never  Vaping Use   Vaping status: Never Used  Substance and Sexual Activity   Alcohol use: Not Currently    Comment: noen sinc e2019   Drug use: Never   Sexual activity: Not Currently    Birth control/protection: Post-menopausal

## 2024-10-18 ENCOUNTER — Encounter: Payer: Self-pay | Admitting: Radiology

## 2024-10-21 NOTE — Progress Notes (Signed)
 HPI: Follow-up hypertension and chronic diastolic CHF. Nuclear study September 2024 showed ejection fraction 56% and no ischemia or infarction. Echocardiogram January 2025 showed normal LV function, grade 1 diastolic dysfunction, moderate pulmonary hypertension, mild left atrial enlargement, mild mitral regurgitation. Patient admitted January 2025 with acute hypoxic respiratory failure felt secondary to a combination of pneumonia and CHF. She was treated with antibiotics and diuretics with improvement. She did have acute metabolic encephalopathy which also improved. Note Cardizem  was discontinued because of bradycardia by report. Since last seen, she has dyspnea with more vigorous activities but not routine activities.  No chest pain, palpitations or syncope.  Current Outpatient Medications  Medication Sig Dispense Refill   albuterol  (VENTOLIN  HFA) 108 (90 Base) MCG/ACT inhaler Inhale 2 puffs into the lungs every 6 (six) hours as needed for wheezing or shortness of breath. 8 g 6   allopurinol  (ZYLOPRIM ) 300 MG tablet Take 1 tablet (300 mg total) by mouth daily. 90 tablet 3   Ascorbic Acid  (VITAMIN C ) 1000 MG tablet Take 1,000 mg by mouth daily.     aspirin  EC 81 MG tablet Take 81 mg by mouth daily.     atorvastatin  (LIPITOR) 40 MG tablet Take 1 tablet (40 mg total) by mouth daily. 90 tablet 3   Cholecalciferol  (VITAMIN D ) 2000 units CAPS Take 2,000 Units by mouth daily. OTC     diazepam  (VALIUM ) 5 MG tablet Take 1 tablet (5 mg total) by mouth every 8 (eight) hours as needed for muscle spasms. 90 tablet 5   glipiZIDE  (GLUCOTROL  XL) 5 MG 24 hr tablet Take 1 tablet (5 mg total) by mouth daily with breakfast. 90 tablet 3   HYDROcodone -acetaminophen  (NORCO) 5-325 MG tablet Take 1 tablet by mouth every 6 (six) hours as needed for moderate pain (pain score 4-6). 120 tablet 0   HYDROcodone -acetaminophen  (NORCO) 5-325 MG tablet Take 1 tablet by mouth every 6 (six) hours as needed for moderate pain (pain  score 4-6). 120 tablet 0   HYDROcodone -acetaminophen  (NORCO) 5-325 MG tablet Take 1 tablet by mouth every 6 (six) hours as needed for moderate pain (pain score 4-6). 120 tablet 0   hydroxychloroquine  (PLAQUENIL ) 200 MG tablet Take 200 mg by mouth 2 (two) times daily. Pt reported changed by Rheumatology     LASIX  20 MG tablet Take 4 tablets (80 mg total) by mouth daily. Taking 2 tablets daily     polyethylene glycol powder (GLYCOLAX /MIRALAX ) powder Take 8.5-17 g by mouth at bedtime.      potassium chloride  (KLOR-CON  10) 10 MEQ tablet Take 2 tablets (20 mEq total) by mouth daily.     saccharomyces boulardii (FLORASTOR) 250 MG capsule Take 250 mg by mouth daily.     Tiotropium Bromide-Olodaterol (STIOLTO RESPIMAT ) 2.5-2.5 MCG/ACT AERS Inhale 2 puffs into the lungs daily. 4 g 5   No current facility-administered medications for this visit.     Past Medical History:  Diagnosis Date   Allergic rhinitis    Anxiety    Arthritis    sees Dr. Jon Jacob    Cellulitis, periorbital 2006   left eye, due to MRSA, saw Dr. Arlana    Chronic kidney disease    sees Dr. Curtis Heman   Complication of anesthesia    COPD (chronic obstructive pulmonary disease) (HCC)    Cough    Diabetes mellitus    Dyspnea    with exertion   GERD (gastroesophageal reflux disease)    OTC  Pepcid    Hemorrhoids  Hyperlipidemia    Hypertension    IBS (irritable bowel syndrome)    Loss of smell    Loss of taste    Memory changes    Menopause    MRSA (methicillin resistant Staphylococcus aureus) 2006   left orbit    Pneumonia    PONV (postoperative nausea and vomiting)    Tubular adenoma of colon 12/2006    Past Surgical History:  Procedure Laterality Date   APPENDECTOMY     BACK SURGERY     COLONOSCOPY  04-01-12   per Dr. Aneita, tubular adenoma, repeat in 5 yrs    CYSTOSCOPY WITH BIOPSY N/A 12/11/2021   Procedure: CYSTOSCOPY WITH BIOPSY;  Surgeon: Elisabeth Valli BIRCH, MD;  Location: [facility];   Service: Urology;  Laterality: N/A;   CYSTOSCOPY WITH FULGERATION N/A 12/11/2021   Procedure: CYSTOSCOPY WITH FULGERATION;  Surgeon: Elisabeth Valli BIRCH, MD;  Location: [facility];  Service: Urology;  Laterality: N/A;   POLYPECTOMY     TUBAL LIGATION      Social History   Socioeconomic History   Marital status: Married    Spouse name: Tom   Number of children: 1   Years of education: college   Highest education level: Bachelor's degree (e.g., BA, AB, BS)  Occupational History    Employer: RETIRED  Tobacco Use   Smoking status: Every Day    Current packs/day: 0.50    Average packs/day: 0.5 packs/day for 1 year (0.5 ttl pk-yrs)    Types: Cigarettes   Smokeless tobacco: Never  Vaping Use   Vaping status: Never Used  Substance and Sexual Activity   Alcohol use: Not Currently    Comment: noen sinc e2019   Drug use: Never   Sexual activity: Not Currently    Birth control/protection: Post-menopausal  Other Topics Concern   Not on file  Social History Narrative   Patient is right-handed.she lives with her husband in a one level home. She drinks decaf coffee, 2 cups a day. One cup iced teas per day. She goes to the gym 3 x a week and is doing P/T 1 x a week.     Social Drivers of Corporate Investment Banker Strain: Not on file  Food Insecurity: No Food Insecurity (12/28/2023)   Hunger Vital Sign    Worried About Running Out of Food in the Last Year: Never true    Ran Out of Food in the Last Year: Never true  Transportation Needs: No Transportation Needs (12/28/2023)   PRAPARE - Administrator, Civil Service (Medical): No    Lack of Transportation (Non-Medical): No  Physical Activity: Not on file  Stress: Not on file  Social Connections: Moderately Isolated (12/28/2023)   Social Connection and Isolation Panel    Frequency of Communication with Friends and Family: More than three times a week    Frequency of Social Gatherings with Friends and Family: Never    Attends  Religious Services: Never    Database Administrator or Organizations: No    Attends Banker Meetings: Never    Marital Status: Married  Catering Manager Violence: Not At Risk (12/28/2023)   Humiliation, Afraid, Rape, and Kick questionnaire    Fear of Current or Ex-Partner: No    Emotionally Abused: No    Physically Abused: No    Sexually Abused: No    Family History  Problem Relation Age of Onset   Alcohol abuse Mother 57  MVA   Diabetes Mother    Heart disease Father 41       MI   Heart disease Brother    Diabetes Brother    Arthritis Sister    Dementia Maternal Grandfather     ROS: no fevers or chills, productive cough, hemoptysis, dysphasia, odynophagia, melena, hematochezia, dysuria, hematuria, rash, seizure activity, orthopnea, PND, pedal edema, claudication. Remaining systems are negative.  Physical Exam: Well-developed well-nourished in no acute distress.  Skin is warm and dry.  HEENT is normal.  Neck is supple.  Chest is clear to auscultation with normal expansion.  Cardiovascular exam is regular rate and rhythm.  Abdominal exam nontender or distended. No masses palpated. Extremities show no edema. neuro grossly intact  EKG Interpretation Date/Time:  Wednesday October 27 2024 11:16:28 EST Ventricular Rate:  55 PR Interval:  208 QRS Duration:  96 QT Interval:  468 QTC Calculation: 447 R Axis:   85  Text Interpretation: Sinus bradycardia Cannot rule out Anterior infarct , age undetermined Confirmed by Pietro Rogue ([id number]) on 10/27/2024 11:29:31 AM    A/P  1 chronic diastolic congestive heart failure-continue diuretic at present dose.  She is euvolemic on examination.  Previously declined SGLT2 inhibitor.  2 hypertension-blood pressure controlled.  Continue present medical regimen.  3 hyperlipidemia-continue statin.  Rogue Pietro, MD

## 2024-10-27 ENCOUNTER — Ambulatory Visit: Attending: Cardiology | Admitting: Cardiology

## 2024-10-27 ENCOUNTER — Encounter: Payer: Self-pay | Admitting: Cardiology

## 2024-10-27 VITALS — BP 133/74 | HR 55 | Ht 63.0 in | Wt 171.0 lb

## 2024-10-27 DIAGNOSIS — I10 Essential (primary) hypertension: Secondary | ICD-10-CM | POA: Insufficient documentation

## 2024-10-27 DIAGNOSIS — E7800 Pure hypercholesterolemia, unspecified: Secondary | ICD-10-CM | POA: Diagnosis not present

## 2024-10-27 DIAGNOSIS — I5032 Chronic diastolic (congestive) heart failure: Secondary | ICD-10-CM | POA: Insufficient documentation

## 2024-10-27 NOTE — Patient Instructions (Signed)
 Medication Instructions:  No medication changes were made at this visit. Continue current regimen.   *If you need a refill on your cardiac medications before your next appointment, please call your pharmacy*  Lab Work: None ordered today. If you have labs (blood work) drawn today and your tests are completely normal, you will receive your results only by: MyChart Message (if you have MyChart) OR A paper copy in the mail If you have any lab test that is abnormal or we need to change your treatment, we will call you to review the results.  Testing/Procedures: None ordered today.  Follow-Up: At [facility], you and your health needs are our priority.  As part of our continuing mission to provide you with exceptional heart care, our providers are all part of one team.  This team includes your primary Cardiologist (physician) and Advanced Practice Providers or APPs (Physician Assistants and Nurse Practitioners) who all work together to provide you with the care you need, when you need it.  Your next appointment:   6 month(s)  Provider:   Redell Shallow, MD

## 2024-10-31 ENCOUNTER — Inpatient Hospital Stay (HOSPITAL_COMMUNITY)
Admission: EM | Admit: 2024-10-31 | Discharge: 2024-11-04 | DRG: 637 | Disposition: A | Discharge status: Home / Self Care | Attending: Emergency Medicine | Admitting: Emergency Medicine

## 2024-10-31 ENCOUNTER — Other Ambulatory Visit: Payer: Self-pay

## 2024-10-31 ENCOUNTER — Encounter (HOSPITAL_COMMUNITY): Payer: Self-pay

## 2024-10-31 ENCOUNTER — Emergency Department (HOSPITAL_COMMUNITY)

## 2024-10-31 DIAGNOSIS — T502X5A Adverse effect of carbonic-anhydrase inhibitors, benzothiadiazides and other diuretics, initial encounter: Secondary | ICD-10-CM | POA: Diagnosis not present

## 2024-10-31 DIAGNOSIS — E1122 Type 2 diabetes mellitus with diabetic chronic kidney disease: Secondary | ICD-10-CM | POA: Diagnosis present

## 2024-10-31 DIAGNOSIS — R748 Abnormal levels of other serum enzymes: Secondary | ICD-10-CM | POA: Diagnosis present

## 2024-10-31 DIAGNOSIS — Z8249 Family history of ischemic heart disease and other diseases of the circulatory system: Secondary | ICD-10-CM | POA: Diagnosis not present

## 2024-10-31 DIAGNOSIS — I10 Essential (primary) hypertension: Secondary | ICD-10-CM | POA: Diagnosis not present

## 2024-10-31 DIAGNOSIS — Z833 Family history of diabetes mellitus: Secondary | ICD-10-CM | POA: Diagnosis not present

## 2024-10-31 DIAGNOSIS — R001 Bradycardia, unspecified: Secondary | ICD-10-CM | POA: Diagnosis present

## 2024-10-31 DIAGNOSIS — Z860101 Personal history of adenomatous and serrated colon polyps: Secondary | ICD-10-CM

## 2024-10-31 DIAGNOSIS — E785 Hyperlipidemia, unspecified: Secondary | ICD-10-CM | POA: Diagnosis present

## 2024-10-31 DIAGNOSIS — Z881 Allergy status to other antibiotic agents status: Secondary | ICD-10-CM

## 2024-10-31 DIAGNOSIS — R918 Other nonspecific abnormal finding of lung field: Secondary | ICD-10-CM | POA: Diagnosis not present

## 2024-10-31 DIAGNOSIS — T68XXXA Hypothermia, initial encounter: Secondary | ICD-10-CM | POA: Diagnosis not present

## 2024-10-31 DIAGNOSIS — G9341 Metabolic encephalopathy: Secondary | ICD-10-CM | POA: Diagnosis present

## 2024-10-31 DIAGNOSIS — F17210 Nicotine dependence, cigarettes, uncomplicated: Secondary | ICD-10-CM | POA: Diagnosis present

## 2024-10-31 DIAGNOSIS — Z1152 Encounter for screening for COVID-19: Secondary | ICD-10-CM

## 2024-10-31 DIAGNOSIS — N1831 Chronic kidney disease, stage 3a: Secondary | ICD-10-CM | POA: Diagnosis present

## 2024-10-31 DIAGNOSIS — E162 Hypoglycemia, unspecified: Secondary | ICD-10-CM | POA: Diagnosis present

## 2024-10-31 DIAGNOSIS — J449 Chronic obstructive pulmonary disease, unspecified: Secondary | ICD-10-CM | POA: Diagnosis present

## 2024-10-31 DIAGNOSIS — I130 Hypertensive heart and chronic kidney disease with heart failure and stage 1 through stage 4 chronic kidney disease, or unspecified chronic kidney disease: Secondary | ICD-10-CM | POA: Diagnosis present

## 2024-10-31 DIAGNOSIS — M109 Gout, unspecified: Secondary | ICD-10-CM | POA: Diagnosis present

## 2024-10-31 DIAGNOSIS — Z7984 Long term (current) use of oral hypoglycemic drugs: Secondary | ICD-10-CM | POA: Diagnosis not present

## 2024-10-31 DIAGNOSIS — R5383 Other fatigue: Secondary | ICD-10-CM | POA: Diagnosis not present

## 2024-10-31 DIAGNOSIS — Z8614 Personal history of Methicillin resistant Staphylococcus aureus infection: Secondary | ICD-10-CM

## 2024-10-31 DIAGNOSIS — Z9981 Dependence on supplemental oxygen: Secondary | ICD-10-CM | POA: Diagnosis not present

## 2024-10-31 DIAGNOSIS — Z7982 Long term (current) use of aspirin: Secondary | ICD-10-CM | POA: Diagnosis not present

## 2024-10-31 DIAGNOSIS — E11649 Type 2 diabetes mellitus with hypoglycemia without coma: Secondary | ICD-10-CM | POA: Diagnosis present

## 2024-10-31 DIAGNOSIS — G8929 Other chronic pain: Secondary | ICD-10-CM | POA: Diagnosis present

## 2024-10-31 DIAGNOSIS — Z79899 Other long term (current) drug therapy: Secondary | ICD-10-CM | POA: Diagnosis not present

## 2024-10-31 DIAGNOSIS — Z8719 Personal history of other diseases of the digestive system: Secondary | ICD-10-CM

## 2024-10-31 DIAGNOSIS — J9621 Acute and chronic respiratory failure with hypoxia: Secondary | ICD-10-CM | POA: Diagnosis present

## 2024-10-31 DIAGNOSIS — D631 Anemia in chronic kidney disease: Secondary | ICD-10-CM | POA: Diagnosis present

## 2024-10-31 DIAGNOSIS — K580 Irritable bowel syndrome with diarrhea: Secondary | ICD-10-CM | POA: Diagnosis present

## 2024-10-31 DIAGNOSIS — Z888 Allergy status to other drugs, medicaments and biological substances status: Secondary | ICD-10-CM

## 2024-10-31 DIAGNOSIS — Z0389 Encounter for observation for other suspected diseases and conditions ruled out: Secondary | ICD-10-CM | POA: Diagnosis not present

## 2024-10-31 DIAGNOSIS — K219 Gastro-esophageal reflux disease without esophagitis: Secondary | ICD-10-CM | POA: Diagnosis present

## 2024-10-31 DIAGNOSIS — I5189 Other ill-defined heart diseases: Secondary | ICD-10-CM

## 2024-10-31 DIAGNOSIS — E1165 Type 2 diabetes mellitus with hyperglycemia: Secondary | ICD-10-CM | POA: Insufficient documentation

## 2024-10-31 DIAGNOSIS — R680 Hypothermia, not associated with low environmental temperature: Secondary | ICD-10-CM | POA: Diagnosis present

## 2024-10-31 DIAGNOSIS — Z9851 Tubal ligation status: Secondary | ICD-10-CM

## 2024-10-31 DIAGNOSIS — N179 Acute kidney failure, unspecified: Secondary | ICD-10-CM | POA: Diagnosis present

## 2024-10-31 DIAGNOSIS — Z9049 Acquired absence of other specified parts of digestive tract: Secondary | ICD-10-CM

## 2024-10-31 DIAGNOSIS — M1991 Primary osteoarthritis, unspecified site: Secondary | ICD-10-CM | POA: Insufficient documentation

## 2024-10-31 DIAGNOSIS — R0600 Dyspnea, unspecified: Secondary | ICD-10-CM | POA: Diagnosis not present

## 2024-10-31 DIAGNOSIS — R7989 Other specified abnormal findings of blood chemistry: Secondary | ICD-10-CM | POA: Diagnosis present

## 2024-10-31 DIAGNOSIS — D649 Anemia, unspecified: Secondary | ICD-10-CM | POA: Diagnosis present

## 2024-10-31 LAB — CBC WITH DIFFERENTIAL/PLATELET
Abs Immature Granulocytes: 0.06 K/uL (ref 0.00–0.07)
Basophils Absolute: 0.1 K/uL (ref 0.0–0.1)
Basophils Relative: 1 %
Eosinophils Absolute: 0 K/uL (ref 0.0–0.5)
Eosinophils Relative: 0 %
HCT: 35.3 % — ABNORMAL LOW (ref 36.0–46.0)
Hemoglobin: 11.2 g/dL — ABNORMAL LOW (ref 12.0–15.0)
Immature Granulocytes: 1 %
Lymphocytes Relative: 6 %
Lymphs Abs: 0.5 K/uL — ABNORMAL LOW (ref 0.7–4.0)
MCH: 28.9 pg (ref 26.0–34.0)
MCHC: 31.7 g/dL (ref 30.0–36.0)
MCV: 91.2 fL (ref 80.0–100.0)
Monocytes Absolute: 0.2 K/uL (ref 0.1–1.0)
Monocytes Relative: 3 %
Neutro Abs: 7.3 K/uL (ref 1.7–7.7)
Neutrophils Relative %: 89 %
Platelets: 228 K/uL (ref 150–400)
RBC: 3.87 MIL/uL (ref 3.87–5.11)
RDW: 16.6 % — ABNORMAL HIGH (ref 11.5–15.5)
WBC: 8.2 K/uL (ref 4.0–10.5)
nRBC: 0 % (ref 0.0–0.2)

## 2024-10-31 LAB — URINALYSIS, W/ REFLEX TO CULTURE (INFECTION SUSPECTED)
Bacteria, UA: NONE SEEN
Bilirubin Urine: NEGATIVE
Glucose, UA: NEGATIVE mg/dL
Hgb urine dipstick: NEGATIVE
Ketones, ur: NEGATIVE mg/dL
Leukocytes,Ua: NEGATIVE
Nitrite: NEGATIVE
Protein, ur: NEGATIVE mg/dL
Specific Gravity, Urine: 1.008 (ref 1.005–1.030)
pH: 5 (ref 5.0–8.0)

## 2024-10-31 LAB — GLUCOSE, CAPILLARY
Glucose-Capillary: 41 mg/dL — CL (ref 70–99)
Glucose-Capillary: 49 mg/dL — ABNORMAL LOW (ref 70–99)
Glucose-Capillary: 94 mg/dL (ref 70–99)
Glucose-Capillary: 145 mg/dL — ABNORMAL HIGH (ref 70–99)
Glucose-Capillary: 120 mg/dL — ABNORMAL HIGH (ref 70–99)
Glucose-Capillary: 90 mg/dL (ref 70–99)
Glucose-Capillary: 82 mg/dL (ref 70–99)
Glucose-Capillary: 77 mg/dL (ref 70–99)
Glucose-Capillary: 85 mg/dL (ref 70–99)

## 2024-10-31 LAB — I-STAT CG4 LACTIC ACID, ED: Lactic Acid, Venous: 1.1 mmol/L (ref 0.5–1.9)

## 2024-10-31 LAB — BASIC METABOLIC PANEL WITH GFR
Anion gap: 10 (ref 5–15)
BUN: 40 mg/dL — ABNORMAL HIGH (ref 8–23)
CO2: 29 mmol/L (ref 22–32)
Calcium: 10.2 mg/dL (ref 8.9–10.3)
Chloride: 101 mmol/L (ref 98–111)
Creatinine, Ser: 1.31 mg/dL — ABNORMAL HIGH (ref 0.44–1.00)
GFR, Estimated: 42 mL/min — ABNORMAL LOW (ref >60)
Glucose, Bld: 111 mg/dL — ABNORMAL HIGH (ref 70–99)
Potassium: 4.2 mmol/L (ref 3.5–5.1)
Sodium: 140 mmol/L (ref 135–145)

## 2024-10-31 LAB — PROTIME-INR
INR: 1 (ref 0.8–1.2)
Prothrombin Time: 14.2 s (ref 11.4–15.2)

## 2024-10-31 LAB — TSH: TSH: 4.64 u[IU]/mL — ABNORMAL HIGH (ref 0.350–4.500)

## 2024-10-31 LAB — HEPATIC FUNCTION PANEL
ALT: 19 U/L (ref 0–44)
AST: 33 U/L (ref 15–41)
Albumin: 4.1 g/dL (ref 3.5–5.0)
Alkaline Phosphatase: 211 U/L — ABNORMAL HIGH (ref 38–126)
Bilirubin, Direct: 0.1 mg/dL (ref 0.0–0.2)
Indirect Bilirubin: 0.1 mg/dL — ABNORMAL LOW (ref 0.3–0.9)
Total Bilirubin: 0.3 mg/dL (ref 0.0–1.2)
Total Protein: 7.4 g/dL (ref 6.5–8.1)

## 2024-10-31 LAB — RESP PANEL BY RT-PCR (RSV, FLU A&B, COVID)  RVPGX2
Influenza A by PCR: NEGATIVE
Influenza B by PCR: NEGATIVE
Resp Syncytial Virus by PCR: NEGATIVE
SARS Coronavirus 2 by RT PCR: NEGATIVE

## 2024-10-31 LAB — CBG MONITORING, ED: Glucose-Capillary: 93 mg/dL (ref 70–99)

## 2024-10-31 MED ORDER — SODIUM CHLORIDE 0.9 % IV SOLN: 250.0000 mL | INTRAVENOUS | Status: AC | PRN

## 2024-10-31 MED ORDER — ATORVASTATIN CALCIUM 40 MG PO TABS
40.0000 mg | ORAL_TABLET | Freq: Every day | ORAL | Status: DC
  Administered 2024-10-31 – 2024-11-04 (×5): 40 mg via ORAL
  Filled 2024-10-31 (×5): qty 1

## 2024-10-31 MED ORDER — ALLOPURINOL 300 MG PO TABS
150.0000 mg | ORAL_TABLET | Freq: Every day | ORAL | Status: DC
  Administered 2024-10-31 – 2024-11-04 (×5): 150 mg via ORAL
  Filled 2024-10-31: qty 1
  Filled 2024-10-31 (×4): qty 2

## 2024-10-31 MED ORDER — POLYETHYLENE GLYCOL 3350 17 G PO PACK: 17.0000 g | PACK | Freq: Every day | ORAL | Status: DC | PRN

## 2024-10-31 MED ORDER — HYDROCODONE-ACETAMINOPHEN 5-325 MG PO TABS
1.0000 | ORAL_TABLET | Freq: Three times a day (TID) | ORAL | Status: DC | PRN
  Administered 2024-10-31 – 2024-11-03 (×9): 1 via ORAL
  Filled 2024-10-31 (×9): qty 1

## 2024-10-31 MED ORDER — ONDANSETRON HCL 4 MG PO TABS: 4.0000 mg | ORAL_TABLET | Freq: Four times a day (QID) | ORAL | Status: DC | PRN

## 2024-10-31 MED ORDER — DEXTROSE 10 % IV SOLN: INTRAVENOUS | Status: DC

## 2024-10-31 MED ORDER — DEXTROSE-SODIUM CHLORIDE 5-0.9 % IV SOLN: Freq: Once | INTRAVENOUS | Status: AC

## 2024-10-31 MED ORDER — FUROSEMIDE 40 MG PO TABS
40.0000 mg | ORAL_TABLET | Freq: Two times a day (BID) | ORAL | Status: DC
  Administered 2024-10-31 – 2024-11-02 (×4): 40 mg via ORAL
  Filled 2024-10-31 (×4): qty 1

## 2024-10-31 MED ORDER — UMECLIDINIUM BROMIDE 62.5 MCG/ACT IN AEPB
1.0000 | INHALATION_SPRAY | Freq: Every day | RESPIRATORY_TRACT | Status: DC
  Administered 2024-11-01 – 2024-11-04 (×4): 1 via RESPIRATORY_TRACT
  Filled 2024-10-31: qty 7

## 2024-10-31 MED ORDER — ARFORMOTEROL TARTRATE 15 MCG/2ML IN NEBU
15.0000 ug | INHALATION_SOLUTION | Freq: Two times a day (BID) | RESPIRATORY_TRACT | Status: DC
Start: 2024-10-31 — End: 2024-11-04
  Administered 2024-10-31 – 2024-11-04 (×8): 15 ug via RESPIRATORY_TRACT
  Filled 2024-10-31 (×7): qty 2

## 2024-10-31 MED ORDER — CARMEX CLASSIC LIP BALM EX OINT
TOPICAL_OINTMENT | CUTANEOUS | Status: DC | PRN
  Filled 2024-10-31 (×2): qty 10

## 2024-10-31 MED ORDER — ASPIRIN 81 MG PO TBEC
81.0000 mg | DELAYED_RELEASE_TABLET | Freq: Every day | ORAL | Status: DC
  Administered 2024-10-31 – 2024-11-04 (×5): 81 mg via ORAL
  Filled 2024-10-31 (×5): qty 1

## 2024-10-31 MED ORDER — ENOXAPARIN SODIUM 40 MG/0.4ML IJ SOSY
40.0000 mg | PREFILLED_SYRINGE | INTRAMUSCULAR | Status: DC
  Administered 2024-10-31 – 2024-11-02 (×3): 40 mg via SUBCUTANEOUS
  Filled 2024-10-31 (×3): qty 0.4

## 2024-10-31 MED ORDER — ONDANSETRON HCL 4 MG/2ML IJ SOLN: 4.0000 mg | Freq: Four times a day (QID) | INTRAMUSCULAR | Status: DC | PRN

## 2024-10-31 MED ORDER — SODIUM CHLORIDE 0.9 % IV SOLN
2.0000 g | Freq: Once | INTRAVENOUS | Status: AC
  Administered 2024-10-31: 2 g via INTRAVENOUS
  Filled 2024-10-31: qty 20

## 2024-10-31 MED ORDER — SACCHAROMYCES BOULARDII 250 MG PO CAPS
250.0000 mg | ORAL_CAPSULE | Freq: Every day | ORAL | Status: DC
  Administered 2024-10-31 – 2024-11-04 (×5): 250 mg via ORAL
  Filled 2024-10-31 (×6): qty 1

## 2024-10-31 MED ORDER — VITAMIN D 25 MCG (1000 UNIT) PO TABS
2000.0000 [IU] | ORAL_TABLET | Freq: Every day | ORAL | Status: DC
  Administered 2024-10-31 – 2024-11-04 (×5): 2000 [IU] via ORAL
  Filled 2024-10-31 (×5): qty 2

## 2024-10-31 MED ORDER — DEXTROSE 50 % IV SOLN: 25.0000 g | INTRAVENOUS | Status: DC | PRN

## 2024-10-31 MED ORDER — SODIUM CHLORIDE 0.9 % IV BOLUS
1000.0000 mL | Freq: Once | INTRAVENOUS | Status: AC
  Administered 2024-10-31: 1000 mL via INTRAVENOUS

## 2024-10-31 MED ORDER — HYDROXYCHLOROQUINE SULFATE 200 MG PO TABS
400.0000 mg | ORAL_TABLET | Freq: Every day | ORAL | Status: DC
  Administered 2024-10-31 – 2024-11-04 (×5): 400 mg via ORAL
  Filled 2024-10-31 (×7): qty 2

## 2024-10-31 MED ORDER — DEXTROSE 50 % IV SOLN: 25.0000 g | Freq: Once | INTRAVENOUS | Status: DC

## 2024-10-31 MED ORDER — DIAZEPAM 5 MG PO TABS
5.0000 mg | ORAL_TABLET | Freq: Every day | ORAL | Status: AC
  Filled 2024-10-31: qty 1

## 2024-10-31 MED ORDER — ACETAMINOPHEN 650 MG RE SUPP: 650.0000 mg | Freq: Four times a day (QID) | RECTAL | Status: DC | PRN

## 2024-10-31 MED ORDER — DIAZEPAM 5 MG PO TABS
5.0000 mg | ORAL_TABLET | Freq: Once | ORAL | Status: AC
  Administered 2024-10-31: 5 mg via ORAL
  Filled 2024-10-31: qty 1

## 2024-10-31 MED ORDER — ACETAMINOPHEN 325 MG PO TABS: 650.0000 mg | ORAL_TABLET | Freq: Four times a day (QID) | ORAL | Status: DC | PRN

## 2024-10-31 MED ORDER — ALBUTEROL SULFATE (2.5 MG/3ML) 0.083% IN NEBU: 3.0000 mL | INHALATION_SOLUTION | Freq: Four times a day (QID) | RESPIRATORY_TRACT | Status: DC | PRN

## 2024-10-31 MED ORDER — POTASSIUM CHLORIDE CRYS ER 10 MEQ PO TBCR
10.0000 meq | EXTENDED_RELEASE_TABLET | Freq: Two times a day (BID) | ORAL | Status: DC
  Administered 2024-10-31 – 2024-11-04 (×8): 10 meq via ORAL
  Filled 2024-10-31 (×9): qty 1

## 2024-10-31 NOTE — H&P (Signed)
 History and Physical    Patient: Deborah Bryant FMW:[id number] DOB: 1946-10-30 DOA: 10/31/2024 DOS: the patient was seen and examined on 10/31/2024 PCP: Johnny Garnette LABOR, MD  Patient coming from: Home  Chief Complaint:  Chief Complaint  Patient presents with   Hypoglycemia   HPI: Deborah Bryant is a 78 y.o. female with medical history significant of allergic nidus, anxiety, memory change, osteoarthritis, history of MRSA periorbital cellulitis, chronic kidney disease, COPD, cough, type 2 diabetes, GERD, hemorrhoids, hyperlipidemia, hypertension, IBS, history of loss of smell and loss taste, history of pneumonia, history of tubular adenoma of the colon who presented to the emergency department BIBEMS after her husband found her unresponsive at home, called him and started CPR.  When EMS arrived they found the patient to be hypoglycemic.  She has continue her regular glipizide , but has skipped some meals over the last 3 days.  She had an episode of hypothermia in the past when she was ill with and UTI.  She denied fever, chills, rhinorrhea, sore throat, wheezing or hemoptysis.  No chest pain, palpitations, diaphoresis, PND, orthopnea or pitting edema of the lower extremities.  No abdominal pain, nausea, emesis, constipation, melena or hematochezia.  She gets occasional diarrhea due to IBS.  No flank pain, dysuria, frequency or hematuria.  No polyuria, polydipsia, polyphagia or blurred vision.   Lab work: CBC showed a white count of 8.2, hemoglobin 11.2 g/dL platelets 771.  Normal PT and INR.  LFTs show an alkaline phosphatase of 211 units/L and indirect bilirubin of 0.1 mg/dL.  The rest of the LFTs were normal.  BMP showed normal electrolytes, glucose 111, BUN 40 and creatinine 1.31 mg/dL.  Coronavirus, influenza and RSV PCR was negative.  Imaging: Portable 1 view chest radiograph with no acute cardiopulmonary abnormality.   ED course: Initial vital signs were temperature 91.7 F, pulse 50, respiration  15, BP 146/58 mmHg and O2 sat 100% on room air.  The patient was placed on a Bair hugger and given dextrose .  Review of Systems: As mentioned in the history of present illness. All other systems reviewed and are negative. Past Medical History:  Diagnosis Date   Allergic rhinitis    Anxiety    Arthritis    sees Dr. Jon Jacob    Cellulitis, periorbital 2006   left eye, due to MRSA, saw Dr. Arlana    Chronic kidney disease    sees Dr. Curtis Heman   Complication of anesthesia    COPD (chronic obstructive pulmonary disease) (HCC)    Cough    Diabetes mellitus    Dyspnea    with exertion   GERD (gastroesophageal reflux disease)    OTC  Pepcid    Hemorrhoids    Hyperlipidemia    Hypertension    IBS (irritable bowel syndrome)    Loss of smell    Loss of taste    Memory changes    Menopause    MRSA (methicillin resistant Staphylococcus aureus) 2006   left orbit    Pneumonia    PONV (postoperative nausea and vomiting)    Tubular adenoma of colon 12/2006   Past Surgical History:  Procedure Laterality Date   APPENDECTOMY     BACK SURGERY     COLONOSCOPY  04-01-12   per Dr. Aneita, tubular adenoma, repeat in 5 yrs    CYSTOSCOPY WITH BIOPSY N/A 12/11/2021   Procedure: CYSTOSCOPY WITH BIOPSY;  Surgeon: Elisabeth Valli BIRCH, MD;  Location: [facility];  Service: Urology;  Laterality: N/A;  CYSTOSCOPY WITH FULGERATION N/A 12/11/2021   Procedure: CYSTOSCOPY WITH FULGERATION;  Surgeon: Elisabeth Valli BIRCH, MD;  Location: [facility];  Service: Urology;  Laterality: N/A;   POLYPECTOMY     TUBAL LIGATION     Social History:  reports that she has been smoking cigarettes. She has a 0.5 pack-year smoking history. She has never used smokeless tobacco. She reports that she does not currently use alcohol. She reports that she does not use drugs.  Allergies  Allergen Reactions   Losartan  Shortness Of Breath   Benazepril  Cough   Lasix  [Furosemide ] Other (See Comments)    Rash, changes in  eyesight   Levaquin [Levofloxacin] Other (See Comments)    Tendon and muscle pain   Phenobarbital Nausea And Vomiting    Family History  Problem Relation Age of Onset   Alcohol abuse Mother 65       MVA   Diabetes Mother    Heart disease Father 72       MI   Heart disease Brother    Diabetes Brother    Arthritis Sister    Dementia Maternal Grandfather     Prior to Admission medications   Medication Sig Start Date End Date Taking? Authorizing Provider  albuterol  (VENTOLIN  HFA) 108 (90 Base) MCG/ACT inhaler Inhale 2 puffs into the lungs every 6 (six) hours as needed for wheezing or shortness of breath. 04/27/24   Kara Dorn NOVAK, MD  allopurinol  (ZYLOPRIM ) 300 MG tablet Take 1 tablet (300 mg total) by mouth daily. 08/19/24   Johnny Garnette LABOR, MD  Ascorbic Acid  (VITAMIN C ) 1000 MG tablet Take 1,000 mg by mouth daily.    [provider]  aspirin  EC 81 MG tablet Take 81 mg by mouth daily.    [provider]  atorvastatin  (LIPITOR) 40 MG tablet Take 1 tablet (40 mg total) by mouth daily. 08/19/24   Johnny Garnette LABOR, MD  Cholecalciferol  (VITAMIN D ) 2000 units CAPS Take 2,000 Units by mouth daily. OTC    [provider]  diazepam  (VALIUM ) 5 MG tablet Take 1 tablet (5 mg total) by mouth every 8 (eight) hours as needed for muscle spasms. 08/19/24   Johnny Garnette LABOR, MD  glipiZIDE  (GLUCOTROL  XL) 5 MG 24 hr tablet Take 1 tablet (5 mg total) by mouth daily with breakfast. 08/19/24   Johnny Garnette LABOR, MD  HYDROcodone -acetaminophen  (NORCO) 5-325 MG tablet Take 1 tablet by mouth every 6 (six) hours as needed for moderate pain (pain score 4-6). 09/14/24   Johnny Garnette LABOR, MD  HYDROcodone -acetaminophen  (NORCO) 5-325 MG tablet Take 1 tablet by mouth every 6 (six) hours as needed for moderate pain (pain score 4-6). 09/14/24   Johnny Garnette LABOR, MD  HYDROcodone -acetaminophen  (NORCO) 5-325 MG tablet Take 1 tablet by mouth every 6 (six) hours as needed for moderate pain (pain score 4-6). 09/14/24    Johnny Garnette LABOR, MD  hydroxychloroquine  (PLAQUENIL ) 200 MG tablet Take 200 mg by mouth 2 (two) times daily. Pt reported changed by Rheumatology    [provider]  LASIX  20 MG tablet Take 4 tablets (80 mg total) by mouth daily. Taking 2 tablets daily 01/03/24   Ghimire, Donalda HERO, MD  polyethylene glycol powder (GLYCOLAX /MIRALAX ) powder Take 8.5-17 g by mouth at bedtime.     [provider]  potassium chloride  (KLOR-CON  10) 10 MEQ tablet Take 2 tablets (20 mEq total) by mouth daily. 01/03/24   Ghimire, Donalda HERO, MD  saccharomyces boulardii (FLORASTOR) 250 MG capsule Take 250  mg by mouth daily.    [provider]  Tiotropium Bromide-Olodaterol (STIOLTO RESPIMAT ) 2.5-2.5 MCG/ACT AERS Inhale 2 puffs into the lungs daily. 09/10/24   Kara Dorn NOVAK, MD    Physical Exam: Vitals:   10/31/24 1100 10/31/24 1107 10/31/24 1253 10/31/24 1300  BP: (!) 146/58   (!) 125/52  Pulse: (!) 50  (!) 53 (!) 51  Resp: 15  14 14   Temp: (!) 91.7 F (33.2 C)  (!) 91.5 F (33.1 C)   TempSrc: Rectal Rectal Rectal   SpO2: 100%  98% 97%  Weight:      Height:       Physical Exam Vitals reviewed.  Constitutional:      General: She is awake. She is not in acute distress.    Appearance: She is ill-appearing.  HENT:     Head: Normocephalic.     Nose: No rhinorrhea.     Mouth/Throat:     Mouth: Mucous membranes are moist.  Eyes:     General: No scleral icterus.    Pupils: Pupils are equal, round, and reactive to light.  Neck:     Vascular: No JVD.  Cardiovascular:     Rate and Rhythm: Normal rate and regular rhythm.     Heart sounds: S1 normal and S2 normal.  Pulmonary:     Effort: Pulmonary effort is normal.     Breath sounds: Normal breath sounds. No wheezing, rhonchi or rales.  Abdominal:     General: Bowel sounds are normal. There is no distension.     Palpations: Abdomen is soft.     Tenderness: There is no abdominal tenderness. There is no right CVA tenderness or left CVA  tenderness.  Musculoskeletal:     Cervical back: Neck supple.     Right lower leg: No edema.     Left lower leg: No edema.  Skin:    General: Skin is warm and dry.  Neurological:     General: No focal deficit present.     Mental Status: She is alert and oriented to person, place, and time.  Psychiatric:        Behavior: Behavior normal. Behavior is cooperative.    Data Reviewed:  Results are pending, will review when available. 12/29/2023 transthoracic echocardiogram report. IMPRESSIONS:   1. Left ventricular ejection fraction, by estimation, is 60 to 65%. The  left ventricle has normal function. The left ventricle has no regional  wall motion abnormalities. Left ventricular diastolic parameters are  consistent with Grade I diastolic  dysfunction (impaired relaxation).   2. Right ventricular systolic function is normal. The right ventricular  size is normal. There is moderately elevated pulmonary artery systolic  pressure.   3. Left atrial size was mildly dilated.   4. The mitral valve is grossly normal. Mild mitral valve regurgitation.   5. The aortic valve was not well visualized. Aortic valve regurgitation  is not visualized.   6. The inferior vena cava is dilated in size with <50% respiratory  variability, suggesting right atrial pressure of 15 mmHg.   EKG: Vent. rate 56 BPM PR interval 213 ms QRS duration 105 ms QT/QTcB 486/470 ms P-R-T axes 66 30 63 Sinus rhythm Borderline prolonged PR interval  Assessment and Plan: Principal Problem:   Hypoglycemia Observation/stepdown. Hold glipizide . Dextrose  10% and 100 mL/h. CBG every 1 hour or as needed. Dextrose  50% as needed for hypoglycemic CBG.  Active Problems:   Hypothermia Likely due to hypoglycemia. No obvious signs of infection.  Essential hypertension Continue furosemide  60 mg p.o. twice daily. Amlodipine  may be inducing lower extremity edema. Continue potassium supplementation.    Grade I  diastolic dysfunction  Continue diuretic as above. Consider switching amlodipine . -Would benefit from ARB/ACE and beta-blocker therapy. - Follow-up with PCP and/or cardiology    Gout Continue allopurinol  150 mg p.o. daily.    Hyperlipemia Continue atorvastatin  40 mg p.o. daily.    Normocytic anemia Monitor hematocrit and hemoglobin.    Chronic pain of both knees Analgesics as needed.    Chronic obstructive pulmonary disease (HCC) Bronchodilators as needed. Continue Stiolto Respimat  twice daily.    Elevated alkaline phosphatase level Will recheck in AM.    Elevated serum creatinine Likely due to diuretic use. Monitor renal function electrolytes.    Advance Care Planning:   Code Status: Full Code   Consults:   Family Communication: Her husband was at bedside.  Severity of Illness: The appropriate patient status for this patient is observation.  Author: Alm Dorn Castor, MD 10/31/2024 1:07 PM  For on call review [email or web address].   This document was prepared using Dragon voice recognition software and may contain some unintended transcription errors.

## 2024-10-31 NOTE — ED Provider Notes (Signed)
 Deborah Bryant EMERGENCY DEPARTMENT AT [facility] Provider Note   CSN: [id number] Arrival date & time: 10/31/24  1042     Patient presents with: Hypoglycemia   Deborah Bryant is a 78 y.o. female.  {Add pertinent medical, surgical, social history, OB history to YEP:67052} Patient was found unresponsive by husband in the bed.  He attempted CPR.  When the paramedics arrived they noticed that her glucose was 33 and they gave her some D50 and she awoke   Hypoglycemia      Prior to Admission medications   Medication Sig Start Date End Date Taking? Authorizing Provider  albuterol  (VENTOLIN  HFA) 108 (90 Base) MCG/ACT inhaler Inhale 2 puffs into the lungs every 6 (six) hours as needed for wheezing or shortness of breath. 04/27/24   Kara Dorn NOVAK, MD  allopurinol  (ZYLOPRIM ) 300 MG tablet Take 1 tablet (300 mg total) by mouth daily. 08/19/24   Johnny Garnette LABOR, MD  Ascorbic Acid  (VITAMIN C ) 1000 MG tablet Take 1,000 mg by mouth daily.    [provider]  aspirin  EC 81 MG tablet Take 81 mg by mouth daily.    [provider]  atorvastatin  (LIPITOR) 40 MG tablet Take 1 tablet (40 mg total) by mouth daily. 08/19/24   Johnny Garnette LABOR, MD  Cholecalciferol  (VITAMIN D ) 2000 units CAPS Take 2,000 Units by mouth daily. OTC    [provider]  diazepam  (VALIUM ) 5 MG tablet Take 1 tablet (5 mg total) by mouth every 8 (eight) hours as needed for muscle spasms. 08/19/24   Johnny Garnette LABOR, MD  glipiZIDE  (GLUCOTROL  XL) 5 MG 24 hr tablet Take 1 tablet (5 mg total) by mouth daily with breakfast. 08/19/24   Johnny Garnette LABOR, MD  HYDROcodone -acetaminophen  (NORCO) 5-325 MG tablet Take 1 tablet by mouth every 6 (six) hours as needed for moderate pain (pain score 4-6). 09/14/24   Johnny Garnette LABOR, MD  HYDROcodone -acetaminophen  (NORCO) 5-325 MG tablet Take 1 tablet by mouth every 6 (six) hours as needed for moderate pain (pain score 4-6). 09/14/24   Johnny Garnette LABOR, MD  HYDROcodone -acetaminophen   (NORCO) 5-325 MG tablet Take 1 tablet by mouth every 6 (six) hours as needed for moderate pain (pain score 4-6). 09/14/24   Johnny Garnette LABOR, MD  hydroxychloroquine  (PLAQUENIL ) 200 MG tablet Take 200 mg by mouth 2 (two) times daily. Pt reported changed by Rheumatology    [provider]  LASIX  20 MG tablet Take 4 tablets (80 mg total) by mouth daily. Taking 2 tablets daily 01/03/24   Ghimire, Donalda HERO, MD  polyethylene glycol powder (GLYCOLAX /MIRALAX ) powder Take 8.5-17 g by mouth at bedtime.     [provider]  potassium chloride  (KLOR-CON  10) 10 MEQ tablet Take 2 tablets (20 mEq total) by mouth daily. 01/03/24   Ghimire, Donalda HERO, MD  saccharomyces boulardii (FLORASTOR) 250 MG capsule Take 250 mg by mouth daily.    [provider]  Tiotropium Bromide-Olodaterol (STIOLTO RESPIMAT ) 2.5-2.5 MCG/ACT AERS Inhale 2 puffs into the lungs daily. 09/10/24   Kara Dorn NOVAK, MD    Allergies: Losartan , Benazepril , Lasix  [furosemide ], Levaquin [levofloxacin], and Phenobarbital    Review of Systems  Updated Vital Signs BP (!) 125/52   Pulse (!) 51   Temp (!) 91.5 F (33.1 C) (Rectal)   Resp 14   Ht 5' 2 (1.575 m)   Wt 77.6 kg   SpO2 97%   BMI 31.28 kg/m   Physical Exam  (all labs ordered are  listed, but only abnormal results are displayed) Labs Reviewed  BASIC METABOLIC PANEL WITH GFR - Abnormal; Notable for the following components:      Result Value   Glucose, Bld 111 (*)    BUN 40 (*)    Creatinine, Ser 1.31 (*)    GFR, Estimated 42 (*)    All other components within normal limits  CBC WITH DIFFERENTIAL/PLATELET - Abnormal; Notable for the following components:   Hemoglobin 11.2 (*)    HCT 35.3 (*)    RDW 16.6 (*)    Lymphs Abs 0.5 (*)    All other components within normal limits  HEPATIC FUNCTION PANEL - Abnormal; Notable for the following components:   Alkaline Phosphatase 211 (*)    Indirect Bilirubin 0.1 (*)    All other components within normal  limits  RESP PANEL BY RT-PCR (RSV, FLU A&B, COVID)  [id number]  CULTURE, BLOOD (ROUTINE X 2)  CULTURE, BLOOD (ROUTINE X 2)  PROTIME-INR  URINALYSIS, W/ REFLEX TO CULTURE (INFECTION SUSPECTED)  CBG MONITORING, ED  I-STAT CG4 LACTIC ACID, ED  CBG MONITORING, ED  CBG MONITORING, ED  CBG MONITORING, ED  I-STAT CG4 LACTIC ACID, ED    EKG: None  Radiology: DG Chest Port 1 View Result Date: 10/31/2024 EXAM: 1 VIEW(S) XRAY OF THE CHEST 10/31/2024 11:46:00 AM COMPARISON: 02/13/2024 CLINICAL HISTORY: Questionable sepsis - evaluate for abnormality. FINDINGS: LUNGS AND PLEURA: Unchanged linear scarring in the left lung base. No focal pulmonary opacity. No pleural effusion. No pneumothorax. HEART AND MEDIASTINUM: No acute abnormality of the cardiac and mediastinal silhouettes. BONES AND SOFT TISSUES: Degenerative changes noted within the thoracic spine. No acute osseous abnormality. IMPRESSION: 1. No acute cardiopulmonary abnormality. Electronically signed by: Waddell Calk MD 10/31/2024 11:51 AM EST RP Workstation: [id number]    {Document cardiac monitor, telemetry assessment procedure when appropriate:32947} Procedures   Medications Ordered in the ED  0.9 %  sodium chloride  infusion (has no administration in time range)  dextrose  50 % solution 25 g (has no administration in time range)  dextrose  10 % infusion (has no administration in time range)  dextrose  50 % solution 25 g (has no administration in time range)  sodium chloride  0.9 % bolus 1,000 mL (1,000 mLs Intravenous New Bag/Given 10/31/24 1212)  dextrose  5 %-0.9 % sodium chloride  infusion ( Intravenous New Bag/Given 10/31/24 1213)  cefTRIAXone  (ROCEPHIN ) 2 g in sodium chloride  0.9 % 100 mL IVPB (2 g Intravenous New Bag/Given 10/31/24 1212)   CRITICAL CARE Performed by: Fairy Sermon Total critical care time: 45 minutes Critical care time was exclusive of separately billable procedures and treating other patients. Critical care was  necessary to treat or prevent imminent or life-threatening deterioration. Critical care was time spent personally by me on the following activities: development of treatment plan with patient and/or surrogate as well as nursing, discussions with consultants, evaluation of patient's response to treatment, examination of patient, obtaining history from patient or surrogate, ordering and performing treatments and interventions, ordering and review of laboratory studies, ordering and review of radiographic studies, pulse oximetry and re-evaluation of patient's condition.    {Click here for ABCD2, HEART and other calculators REFRESH Note before signing:1}                              Medical Decision Making Amount and/or Complexity of Data Reviewed Labs: ordered. Radiology: ordered.  Risk Prescription drug management. Decision regarding hospitalization.   Hypoglycemia and hypothermia.  Patient is admitted to medicine  {Document critical care time when appropriate  Document review of labs and clinical decision tools ie CHADS2VASC2, etc  Document your independent review of radiology images and any outside records  Document your discussion with family members, caretakers and with consultants  Document social determinants of health affecting pt's care  Document your decision making why or why not admission, treatments were needed:32947:::1}   Final diagnoses:  Hypoglycemia  Hypothermia, initial encounter    ED Discharge Orders     None

## 2024-10-31 NOTE — ED Triage Notes (Addendum)
 Patient BIB [facility] from home. Has type 2 diabetes. Slept in today. Husband tried to wake patient up and called 911. Husband did compressions on patient thinking she was unresponsive. (Unknown how long he did CPR). Patient denies chest pain right now. EMS arrived, snoring respirations, blood sugar was 33. EMS gave D10. Sugar increased to 143. Family said patient has been eating less and earlier than normal. Patient takes oral diabetes medication.

## 2024-10-31 NOTE — ED Notes (Signed)
 Blood cultures were collected before antibiotics started. Error in charting. Will not let me adjust times.

## 2024-10-31 NOTE — Sepsis Progress Note (Signed)
 Sepsis protocol is being followed by [facility].

## 2024-11-01 DIAGNOSIS — I130 Hypertensive heart and chronic kidney disease with heart failure and stage 1 through stage 4 chronic kidney disease, or unspecified chronic kidney disease: Secondary | ICD-10-CM | POA: Diagnosis present

## 2024-11-01 DIAGNOSIS — Z79899 Other long term (current) drug therapy: Secondary | ICD-10-CM | POA: Diagnosis not present

## 2024-11-01 DIAGNOSIS — M109 Gout, unspecified: Secondary | ICD-10-CM | POA: Diagnosis present

## 2024-11-01 DIAGNOSIS — Z1152 Encounter for screening for COVID-19: Secondary | ICD-10-CM | POA: Diagnosis not present

## 2024-11-01 DIAGNOSIS — G8929 Other chronic pain: Secondary | ICD-10-CM | POA: Diagnosis present

## 2024-11-01 DIAGNOSIS — R001 Bradycardia, unspecified: Secondary | ICD-10-CM | POA: Diagnosis present

## 2024-11-01 DIAGNOSIS — J449 Chronic obstructive pulmonary disease, unspecified: Secondary | ICD-10-CM | POA: Diagnosis present

## 2024-11-01 DIAGNOSIS — Z833 Family history of diabetes mellitus: Secondary | ICD-10-CM | POA: Diagnosis not present

## 2024-11-01 DIAGNOSIS — K219 Gastro-esophageal reflux disease without esophagitis: Secondary | ICD-10-CM | POA: Diagnosis present

## 2024-11-01 DIAGNOSIS — G9341 Metabolic encephalopathy: Secondary | ICD-10-CM | POA: Diagnosis present

## 2024-11-01 DIAGNOSIS — R680 Hypothermia, not associated with low environmental temperature: Secondary | ICD-10-CM | POA: Diagnosis present

## 2024-11-01 DIAGNOSIS — K580 Irritable bowel syndrome with diarrhea: Secondary | ICD-10-CM | POA: Diagnosis present

## 2024-11-01 DIAGNOSIS — Z7982 Long term (current) use of aspirin: Secondary | ICD-10-CM | POA: Diagnosis not present

## 2024-11-01 DIAGNOSIS — E11649 Type 2 diabetes mellitus with hypoglycemia without coma: Secondary | ICD-10-CM | POA: Diagnosis present

## 2024-11-01 DIAGNOSIS — Z7984 Long term (current) use of oral hypoglycemic drugs: Secondary | ICD-10-CM | POA: Diagnosis not present

## 2024-11-01 DIAGNOSIS — J9621 Acute and chronic respiratory failure with hypoxia: Secondary | ICD-10-CM | POA: Diagnosis present

## 2024-11-01 DIAGNOSIS — E1122 Type 2 diabetes mellitus with diabetic chronic kidney disease: Secondary | ICD-10-CM | POA: Diagnosis present

## 2024-11-01 DIAGNOSIS — N179 Acute kidney failure, unspecified: Secondary | ICD-10-CM | POA: Diagnosis present

## 2024-11-01 DIAGNOSIS — Z9981 Dependence on supplemental oxygen: Secondary | ICD-10-CM | POA: Diagnosis not present

## 2024-11-01 DIAGNOSIS — E162 Hypoglycemia, unspecified: Secondary | ICD-10-CM | POA: Diagnosis present

## 2024-11-01 DIAGNOSIS — N1831 Chronic kidney disease, stage 3a: Secondary | ICD-10-CM | POA: Diagnosis present

## 2024-11-01 DIAGNOSIS — Z8249 Family history of ischemic heart disease and other diseases of the circulatory system: Secondary | ICD-10-CM | POA: Diagnosis not present

## 2024-11-01 DIAGNOSIS — E785 Hyperlipidemia, unspecified: Secondary | ICD-10-CM | POA: Diagnosis present

## 2024-11-01 DIAGNOSIS — D631 Anemia in chronic kidney disease: Secondary | ICD-10-CM | POA: Diagnosis present

## 2024-11-01 DIAGNOSIS — F17210 Nicotine dependence, cigarettes, uncomplicated: Secondary | ICD-10-CM | POA: Diagnosis present

## 2024-11-01 LAB — COMPREHENSIVE METABOLIC PANEL WITH GFR
ALT: 17 U/L (ref 0–44)
AST: 31 U/L (ref 15–41)
Albumin: 3.5 g/dL (ref 3.5–5.0)
Alkaline Phosphatase: 176 U/L — ABNORMAL HIGH (ref 38–126)
Anion gap: 11 (ref 5–15)
BUN: 31 mg/dL — ABNORMAL HIGH (ref 8–23)
CO2: 27 mmol/L (ref 22–32)
Calcium: 9.3 mg/dL (ref 8.9–10.3)
Chloride: 100 mmol/L (ref 98–111)
Creatinine, Ser: 1 mg/dL (ref 0.44–1.00)
GFR, Estimated: 57 mL/min — ABNORMAL LOW (ref >60)
Glucose, Bld: 106 mg/dL — ABNORMAL HIGH (ref 70–99)
Potassium: 4.3 mmol/L (ref 3.5–5.1)
Sodium: 137 mmol/L (ref 135–145)
Total Bilirubin: 0.2 mg/dL (ref 0.0–1.2)
Total Protein: 6.3 g/dL — ABNORMAL LOW (ref 6.5–8.1)

## 2024-11-01 LAB — GLUCOSE, CAPILLARY
Glucose-Capillary: 116 mg/dL — ABNORMAL HIGH (ref 70–99)
Glucose-Capillary: 118 mg/dL — ABNORMAL HIGH (ref 70–99)
Glucose-Capillary: 128 mg/dL — ABNORMAL HIGH (ref 70–99)
Glucose-Capillary: 134 mg/dL — ABNORMAL HIGH (ref 70–99)
Glucose-Capillary: 144 mg/dL — ABNORMAL HIGH (ref 70–99)
Glucose-Capillary: 163 mg/dL — ABNORMAL HIGH (ref 70–99)
Glucose-Capillary: 169 mg/dL — ABNORMAL HIGH (ref 70–99)
Glucose-Capillary: 82 mg/dL (ref 70–99)
Glucose-Capillary: 95 mg/dL (ref 70–99)

## 2024-11-01 LAB — T4, FREE: Free T4: 0.76 ng/dL (ref 0.61–1.12)

## 2024-11-01 LAB — CBC
HCT: 34 % — ABNORMAL LOW (ref 36.0–46.0)
Hemoglobin: 10.9 g/dL — ABNORMAL LOW (ref 12.0–15.0)
MCH: 29.5 pg (ref 26.0–34.0)
MCHC: 32.1 g/dL (ref 30.0–36.0)
MCV: 91.9 fL (ref 80.0–100.0)
Platelets: 203 K/uL (ref 150–400)
RBC: 3.7 MIL/uL — ABNORMAL LOW (ref 3.87–5.11)
RDW: 16.6 % — ABNORMAL HIGH (ref 11.5–15.5)
WBC: 8 K/uL (ref 4.0–10.5)
nRBC: 0 % (ref 0.0–0.2)

## 2024-11-01 LAB — VITAMIN B12: Vitamin B-12: 867 pg/mL (ref 180–914)

## 2024-11-01 LAB — CORTISOL: Cortisol, Plasma: 14 ug/dL

## 2024-11-01 MED ORDER — CHLORHEXIDINE GLUCONATE CLOTH 2 % EX PADS
6.0000 | MEDICATED_PAD | Freq: Every day | CUTANEOUS | Status: DC
  Administered 2024-11-02: 6 via TOPICAL

## 2024-11-01 NOTE — Progress Notes (Signed)
 PROGRESS NOTE    Deborah Bryant  FMW:[id number] DOB: October 01, 1946 DOA: 10/31/2024 PCP: Johnny Garnette LABOR, MD   Brief Narrative: 78 year old with past medical history significant for anxiety, history of MRSA periorbital cellulitis, chronic kidney disease, COPD, diabetes type 2, GERD, hyperlipidemia, hypertension, IBS who presented by EMS after patient was found unresponsive by husband.  Husband started CPR on EMS arrival patient was hypoglycemic.  Patient was taking glipizide , and was having poor oral intake.  Evaluation in the ED found to have normal electrolytes, glucose 111, BUN 40, creatinine 1.3, coronavirus influenza and RSV negative.  Portable chest x-ray no acute cardiopulmonary abnormality.  She was found to be hypothermic with a temperature of 91 and she was placed on Bair hugger and admitted for further care.   Assessment & Plan:   Principal Problem:   Hypoglycemia Active Problems:   Hypothermia   Essential hypertension   Hyperlipemia   Gout   Normocytic anemia   Chronic pain of both knees   Chronic obstructive pulmonary disease (HCC)   Elevated alkaline phosphatase level   Elevated serum creatinine   Grade I diastolic dysfunction  1-Hypoglycemia: - Presented with hypoglycemia, in the setting of poor oral intake and glipizide .  - Currently on D10 infusion -change diet to regular.  -Discontinue glipizide .   Hypothermia: - Could be in the setting of hypoglycemia. -Didn't like  bear huger.  -Follow Blood cultures.  -TSH 4.6 -UA negative.   Altered mental status: -in setting of hypoglycemia.  -Resolved   Hypertension: -Continue with lasix .   Bradycardia: -Asymptomatic. Chronic.  Exacerbated by hypothermia.   Grade 1 diastolic dysfunction: -Continue Lasix  twice daily  Gout: Continue allopurinol .  Hyperlipidemia: Continue atorvastatin .  Normocytic anemia: Monitor hemoglobin.  Chronic pain: Continue pain management.  Chronic obstructive pulmonary  disease: Continue Brovana, Incruse  Elevated alkaline phosphatase: Chronic previous level 166- follow up out patient  -Down to 176.  Elevated serum creatinine: CKD IIIa: Creatinine range 0.8-1.0     Estimated body mass index is 33.02 kg/m as calculated from the following:   Height as of this encounter: 5' 2 (1.575 m).   Weight as of this encounter: 81.9 kg.   DVT prophylaxis: Lovenox Code Status: Full code Family Communication: care discussed with patient.  Disposition Plan:  Status is: Observation The patient remains OBS appropriate and will d/c before 2 midnights.    Consultants:  none  Procedures:  none  Antimicrobials:    Subjective: She is alert, no new complaints. She has been eating less, she is not able to cook anymore.  She doesn't like frozen food.   Objective: Vitals:   11/01/24 0200 11/01/24 0300 11/01/24 0400 11/01/24 0500  BP: (!) 132/38 (!) 140/47 (!) 132/33 (!) 120/35  Pulse: (!) 50 (!) 53 (!) 51   Resp: 14 17 17    Temp:   (!) 96.1 F (35.6 C)   TempSrc:   Axillary   SpO2: 93% 96% 95%   Weight:      Height:        Intake/Output Summary (Last 24 hours) at 11/01/2024 0708 Last data filed at 11/01/2024 0658 Gross per 24 hour  Intake 2620.66 ml  Output 2950 ml  Net -329.34 ml   Filed Weights   10/31/24 1058 10/31/24 1529  Weight: 77.6 kg 81.9 kg    Examination:  General exam: Appears calm and comfortable  Respiratory system: Clear to auscultation. Respiratory effort normal. Cardiovascular system: S1 & S2 heard, RRR. No JVD, murmurs, rubs, gallops or  clicks. No pedal edema. Gastrointestinal system: Abdomen is nondistended, soft and nontender. No organomegaly or masses felt. Normal bowel sounds heard. Central nervous system: Alert and oriented. No focal neurological deficits. Extremities: Symmetric 5 x 5 power.    Data Reviewed: I have personally reviewed following labs and imaging studies  CBC: Recent Labs  Lab  10/31/24 1218 11/01/24 0304  WBC 8.2 8.0  NEUTROABS 7.3  --   HGB 11.2* 10.9*  HCT 35.3* 34.0*  MCV 91.2 91.9  PLT 228 203   Basic Metabolic Panel: Recent Labs  Lab 10/31/24 1219 11/01/24 0304  NA 140 137  K 4.2 4.3  CL 101 100  CO2 29 27  GLUCOSE 111* 106*  BUN 40* 31*  CREATININE 1.31* 1.00  CALCIUM  10.2 9.3   GFR: Estimated Creatinine Clearance: 46 mL/min (by C-G formula based on SCr of 1 mg/dL). Liver Function Tests: Recent Labs  Lab 10/31/24 1219 11/01/24 0304  AST 33 31  ALT 19 17  ALKPHOS 211* 176*  BILITOT 0.3 0.2  PROT 7.4 6.3*  ALBUMIN 4.1 3.5   No results for input(s): LIPASE, AMYLASE in the last 168 hours. No results for input(s): AMMONIA in the last 168 hours. Coagulation Profile: Recent Labs  Lab 10/31/24 1218  INR 1.0   Cardiac Enzymes: No results for input(s): CKTOTAL, CKMB, CKMBINDEX, TROPONINI in the last 168 hours. BNP (last 3 results) Recent Labs    01/21/24 1114  PROBNP 2,141*   HbA1C: No results for input(s): HGBA1C in the last 72 hours. CBG: Recent Labs  Lab 10/31/24 2301 11/01/24 0009 11/01/24 0105 11/01/24 0205 11/01/24 0334  GLUCAP 85 82 95 118* 116*   Lipid Profile: No results for input(s): CHOL, HDL, LDLCALC, TRIG, CHOLHDL, LDLDIRECT in the last 72 hours. Thyroid  Function Tests: Recent Labs    10/31/24 1539  TSH 4.640*   Anemia Panel: No results for input(s): VITAMINB12, FOLATE, FERRITIN, TIBC, IRON, RETICCTPCT in the last 72 hours. Sepsis Labs: Recent Labs  Lab 10/31/24 1211  LATICACIDVEN 1.1    Recent Results (from the past 240 hours)  Resp panel by RT-PCR (RSV, Flu A&B, Covid) Anterior Nasal Swab     Status: None   Collection Time: 10/31/24 12:17 PM   Specimen: Anterior Nasal Swab  Result Value Ref Range Status   SARS Coronavirus 2 by RT PCR NEGATIVE NEGATIVE Final    Comment: (NOTE) SARS-CoV-2 target nucleic acids are NOT DETECTED.  The SARS-CoV-2 RNA is  generally detectable in upper respiratory specimens during the acute phase of infection. The lowest concentration of SARS-CoV-2 viral copies this assay can detect is 138 copies/mL. A negative result does not preclude SARS-Cov-2 infection and should not be used as the sole basis for treatment or other patient management decisions. A negative result may occur with  improper specimen collection/handling, submission of specimen other than nasopharyngeal swab, presence of viral mutation(s) within the areas targeted by this assay, and inadequate number of viral copies(<138 copies/mL). A negative result must be combined with clinical observations, patient history, and epidemiological information. The expected result is Negative.  Fact Sheet for Patients:  bloggercourse.com  Fact Sheet for Healthcare Providers:  seriousbroker.it  This test is no t yet approved or cleared by the [place]  FDA and  has been authorized for detection and/or diagnosis of SARS-CoV-2 by FDA under an Emergency Use Authorization (EUA). This EUA will remain  in effect (meaning this test can be used) for the duration of the COVID-19 declaration under Section 564(b)(1) of  the Act, 21 U.S.C.section 360bbb-3(b)(1), unless the authorization is terminated  or revoked sooner.       Influenza A by PCR NEGATIVE NEGATIVE Final   Influenza B by PCR NEGATIVE NEGATIVE Final    Comment: (NOTE) The Xpert Xpress SARS-CoV-2/FLU/RSV plus assay is intended as an aid in the diagnosis of influenza from Nasopharyngeal swab specimens and should not be used as a sole basis for treatment. Nasal washings and aspirates are unacceptable for Xpert Xpress SARS-CoV-2/FLU/RSV testing.  Fact Sheet for Patients: bloggercourse.com  Fact Sheet for Healthcare Providers: seriousbroker.it  This test is not yet approved or cleared by the [place]  [place] FDA and has been authorized for detection and/or diagnosis of SARS-CoV-2 by FDA under an Emergency Use Authorization (EUA). This EUA will remain in effect (meaning this test can be used) for the duration of the COVID-19 declaration under Section 564(b)(1) of the Act, 21 U.S.C. section 360bbb-3(b)(1), unless the authorization is terminated or revoked.     Resp Syncytial Virus by PCR NEGATIVE NEGATIVE Final    Comment: (NOTE) Fact Sheet for Patients: bloggercourse.com  Fact Sheet for Healthcare Providers: seriousbroker.it  This test is not yet approved or cleared by the [place]  FDA and has been authorized for detection and/or diagnosis of SARS-CoV-2 by FDA under an Emergency Use Authorization (EUA). This EUA will remain in effect (meaning this test can be used) for the duration of the COVID-19 declaration under Section 564(b)(1) of the Act, 21 U.S.C. section 360bbb-3(b)(1), unless the authorization is terminated or revoked.  Performed at [facility], [address] [place], [place] [address]          Radiology Studies: DG Chest Port 1 View Result Date: 10/31/2024 EXAM: 1 VIEW(S) XRAY OF THE CHEST 10/31/2024 11:46:00 AM COMPARISON: 02/13/2024 CLINICAL HISTORY: Questionable sepsis - evaluate for abnormality. FINDINGS: LUNGS AND PLEURA: Unchanged linear scarring in the left lung base. No focal pulmonary opacity. No pleural effusion. No pneumothorax. HEART AND MEDIASTINUM: No acute abnormality of the cardiac and mediastinal silhouettes. BONES AND SOFT TISSUES: Degenerative changes noted within the thoracic spine. No acute osseous abnormality. IMPRESSION: 1. No acute cardiopulmonary abnormality. Electronically signed by: Taylor Stroud MD 10/31/2024 11:51 AM EST RP Workstation: [id number]        Scheduled Meds:  allopurinol   150 mg Oral Daily   arformoterol  15 mcg Nebulization BID   And    umeclidinium bromide  1 puff Inhalation Daily   aspirin  EC  81 mg Oral Daily   atorvastatin   40 mg Oral Daily   Chlorhexidine  Gluconate Cloth  6 each Topical Q2200   cholecalciferol   2,000 Units Oral Daily   dextrose   25 g Intravenous Once   diazepam   5 mg Oral QHS   enoxaparin (LOVENOX) injection  40 mg Subcutaneous Q24H   furosemide   40 mg Oral BID   hydroxychloroquine   400 mg Oral Daily   potassium chloride   10 mEq Oral BID   saccharomyces boulardii  250 mg Oral Daily   Continuous Infusions:  sodium chloride      dextrose  100 mL/hr at 11/01/24 0658     LOS: 0 days    Time spent: 35 Minutes,.     Owen DELENA Lore, MD [facility]   If 7PM-7AM, please contact night-coverage [email or web address]  11/01/2024, 7:08 AM

## 2024-11-01 NOTE — Progress Notes (Signed)
 Patient's temp sustaining at 95-96. Patient states this is her normal body temp and is refusing to have extra blankets or the heat adjusted because she is hot.

## 2024-11-01 NOTE — Plan of Care (Signed)
  Problem: Clinical Measurements: Goal: Ability to maintain clinical measurements within normal limits will improve Outcome: Progressing   Problem: Pain Managment: Goal: General experience of comfort will improve and/or be controlled Outcome: Progressing   Problem: Safety: Goal: Ability to remain free from injury will improve Outcome: Progressing

## 2024-11-02 ENCOUNTER — Inpatient Hospital Stay (HOSPITAL_COMMUNITY)

## 2024-11-02 DIAGNOSIS — E162 Hypoglycemia, unspecified: Secondary | ICD-10-CM | POA: Diagnosis not present

## 2024-11-02 LAB — BASIC METABOLIC PANEL WITH GFR
Anion gap: 10 (ref 5–15)
BUN: 28 mg/dL — ABNORMAL HIGH (ref 8–23)
CO2: 26 mmol/L (ref 22–32)
Calcium: 9.1 mg/dL (ref 8.9–10.3)
Chloride: 94 mmol/L — ABNORMAL LOW (ref 98–111)
Creatinine, Ser: 1.14 mg/dL — ABNORMAL HIGH (ref 0.44–1.00)
GFR, Estimated: 49 mL/min — ABNORMAL LOW (ref >60)
Glucose, Bld: 124 mg/dL — ABNORMAL HIGH (ref 70–99)
Potassium: 4.4 mmol/L (ref 3.5–5.1)
Sodium: 130 mmol/L — ABNORMAL LOW (ref 135–145)

## 2024-11-02 LAB — GLUCOSE, CAPILLARY
Glucose-Capillary: 142 mg/dL — ABNORMAL HIGH (ref 70–99)
Glucose-Capillary: 140 mg/dL — ABNORMAL HIGH (ref 70–99)
Glucose-Capillary: 131 mg/dL — ABNORMAL HIGH (ref 70–99)
Glucose-Capillary: 106 mg/dL — ABNORMAL HIGH (ref 70–99)
Glucose-Capillary: 132 mg/dL — ABNORMAL HIGH (ref 70–99)
Glucose-Capillary: 159 mg/dL — ABNORMAL HIGH (ref 70–99)
Glucose-Capillary: 110 mg/dL — ABNORMAL HIGH (ref 70–99)

## 2024-11-02 LAB — CBC
HCT: 30.9 % — ABNORMAL LOW (ref 36.0–46.0)
Hemoglobin: 10.2 g/dL — ABNORMAL LOW (ref 12.0–15.0)
MCH: 29.1 pg (ref 26.0–34.0)
MCHC: 33 g/dL (ref 30.0–36.0)
MCV: 88 fL (ref 80.0–100.0)
Platelets: 226 K/uL (ref 150–400)
RBC: 3.51 MIL/uL — ABNORMAL LOW (ref 3.87–5.11)
RDW: 16.3 % — ABNORMAL HIGH (ref 11.5–15.5)
WBC: 8.6 K/uL (ref 4.0–10.5)
nRBC: 0 % (ref 0.0–0.2)

## 2024-11-02 LAB — T3, FREE: T3, Free: 2.1 pg/mL (ref 2.0–4.4)

## 2024-11-02 LAB — MAGNESIUM: Magnesium: 1.9 mg/dL (ref 1.7–2.4)

## 2024-11-02 MED ORDER — FUROSEMIDE 40 MG PO TABS
40.0000 mg | ORAL_TABLET | Freq: Two times a day (BID) | ORAL | Status: DC
Start: 2024-11-03 — End: 2024-11-03

## 2024-11-02 MED ORDER — FUROSEMIDE 10 MG/ML IJ SOLN
40.0000 mg | Freq: Once | INTRAMUSCULAR | Status: AC
  Administered 2024-11-02: 40 mg via INTRAVENOUS
  Filled 2024-11-02: qty 4

## 2024-11-02 NOTE — Plan of Care (Signed)
   Problem: Education: Goal: Knowledge of General Education information will improve Description Including pain rating scale, medication(s)/side effects and non-pharmacologic comfort measures Outcome: Progressing   Problem: Clinical Measurements: Goal: Diagnostic test results will improve Outcome: Progressing Goal: Respiratory complications will improve Outcome: Progressing

## 2024-11-02 NOTE — TOC Initial Note (Signed)
 Transition of Care [facility]) - Initial/Assessment Note    Patient Details  Name: Deborah Bryant MRN: [id number] Date of Birth: September 25, 1946  Transition of Care [facility]) CM/SW Contact:    Deborah ONEIDA Anon, RN Phone Number: 11/02/2024, 3:42 PM  Clinical Narrative:                 Pt is from home with spouse. Admitted and found to have a low blood glucose. Pt needing continued medical workup, not medically ready for discharge. IP CM will follow for any DC needs.     Expected Discharge Plan:  (TBD) Barriers to Discharge: Continued Medical Work up   Patient Goals and CMS Choice Patient states their goals for this hospitalization and ongoing recovery are:: Home CMS Medicare.gov Compare Post Acute Care list provided to:: Patient Choice offered to / list presented to : Patient [place] ownership interest in [facility].provided to:: Patient    Expected Discharge Plan and Services In-house Referral: NA Discharge Planning Services: CM Consult Post Acute Care Choice: Durable Medical Equipment Living arrangements for the past 2 months: Single Family Home                 DME Arranged: N/A DME Agency: NA       HH Arranged: NA HH Agency: NA        Prior Living Arrangements/Services Living arrangements for the past 2 months: Single Family Home Lives with:: Spouse Patient language and need for interpreter reviewed:: Yes Do you feel safe going back to the place where you live?: Yes      Need for Family Participation in Patient Care: Yes (Comment) Care giver support system in place?: Yes (comment) Current home services: DME Criminal Activity/Legal Involvement Pertinent to Current Situation/Hospitalization: No - Comment as needed  Activities of Daily Living   ADL Screening (condition at time of admission) Independently performs ADLs?: Yes (appropriate for developmental age) Is the patient deaf or have difficulty hearing?: No Does the patient have difficulty seeing, even when  wearing glasses/contacts?: No Does the patient have difficulty concentrating, remembering, or making decisions?: No  Permission Sought/Granted Permission sought to share information with : Family Supports    Share Information with NAME: Deborah Bryant (Daughter)  [phone]           Emotional Assessment Appearance:: Appears stated age Attitude/Demeanor/Rapport: Unable to Assess Affect (typically observed): Unable to Assess Orientation: : Oriented to Self, Oriented to Place, Oriented to  Time, Oriented to Situation Alcohol / Substance Use: Not Applicable Psych Involvement: No (comment)  Admission diagnosis:  Hypoglycemia [E16.2] Hypothermia, initial encounter [T68.XXXA] Patient Active Problem List   Diagnosis Date Noted   Hypoglycemia 10/31/2024   Hyperglycemia due to type 2 diabetes mellitus (HCC) 10/31/2024   Primary localized osteoarthrosis of multiple sites 10/31/2024   Elevated alkaline phosphatase level 10/31/2024   Elevated serum creatinine 10/31/2024   Grade I diastolic dysfunction 10/31/2024   Postmenopausal vaginal bleeding 02/13/2024   Chronic obstructive pulmonary disease (HCC) 02/13/2024   Acute respiratory failure with hypoxia (HCC) 12/28/2023   Hyponatremia 12/28/2023   Chronic pain 12/28/2023   Prolonged QT interval 12/28/2023   Acute on chronic diastolic CHF (congestive heart failure) (HCC) 12/28/2023   Left lower lobe pneumonia 12/28/2023   Acute encephalopathy 12/28/2023   Hypothermia 12/28/2023   Bilateral leg edema 08/19/2023   Chronic pain of both knees 08/19/2023   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   Confusion 11/01/2019   Anosmia 11/01/2019   Ageusia 11/01/2019   Loss of  smell 02/02/2019   Loss of taste 02/02/2019   Degenerative spondylolisthesis 09/29/2018   Chronic cough 09/09/2018   Low back pain 06/09/2018   Shoulder pain, bilateral 03/13/2018   Acute renal failure superimposed on stage 2 chronic kidney disease 07/03/2017    IBS (irritable bowel syndrome) 07/03/2017   Vitamin D  deficiency 08/01/2015   Mixed simple and mucopurulent chronic bronchitis (HCC) 06/11/2015   Chorioretinal scar, 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   Nuclear cataract, bilateral 06/16/2014   Smoker 09/07/2010   Normocytic anemia 03/29/2008   Gout 10/07/2007   Diabetes mellitus without complication (HCC) 07/21/2007   Hyperlipemia 07/21/2007   Essential hypertension 07/21/2007   PCP:  Johnny Garnette LABOR, MD Pharmacy:   [facility] [place], [place] - [address] [place] [address] Phone: [phone] Fax: [phone]  [facility] [place] [facility] [address] [place] [address] Phone: [phone] Fax: [phone]     Social Drivers of Health (SDOH) Social History: SDOH Screenings   Food Insecurity: No Food Insecurity (10/31/2024)  Housing: Low Risk  (10/31/2024)  Transportation Needs: No Transportation Needs (10/31/2024)  Utilities: Not At Risk (10/31/2024)  Depression (PHQ2-9): Medium Risk (03/15/2024)  Social Connections: Moderately Isolated (10/31/2024)  Tobacco Use: High Risk (10/31/2024)   SDOH Interventions:     Readmission Risk Interventions    11/02/2024    3:40 PM  Readmission Risk Prevention Plan  Transportation Screening Complete  PCP or Specialist Appt within 5-7 Days Complete  Home Care Screening Complete  Medication Review (RN CM) Complete

## 2024-11-02 NOTE — Progress Notes (Signed)
 PROGRESS NOTE    Deborah Bryant  FMW:[id number] DOB: [birth year] DOA: 10/31/2024 PCP: Johnny Garnette LABOR, MD   Brief Narrative: 78 year old with past medical history significant for anxiety, history of MRSA periorbital cellulitis, chronic kidney disease, COPD, diabetes type 2, GERD, hyperlipidemia, hypertension, IBS who presented by EMS after patient was found unresponsive by husband.  Husband started CPR on EMS arrival as  patient was hypoglycemic.  Patient was taking glipizide , and was having poor oral intake.  Evaluation in the ED found to have normal electrolytes, glucose 111, BUN 40, creatinine 1.3, coronavirus influenza and RSV negative.  Portable chest x-ray no acute cardiopulmonary abnormality.  She was found to be hypothermic with a temperature of 91 and she was placed on Bair hugger and admitted for further care.   Assessment & Plan:   Principal Problem:   Hypoglycemia Active Problems:   Hypothermia   Essential hypertension   Hyperlipemia   Gout   Normocytic anemia   Chronic pain of both knees   Chronic obstructive pulmonary disease (HCC)   Elevated alkaline phosphatase level   Elevated serum creatinine   Grade I diastolic dysfunction  1-Hypoglycemia: - Presented with hypoglycemia, in the setting of poor oral intake and glipizide .  - Treated with D10 infusion. CBG improved. NSL fluids and monitor.  -Discontinue glipizide . Will need to consider other option for DM>   Hypothermia: - Could be in the setting of hypoglycemia. She has had prior history of hypothermia.  -Didn't like  bear huger.  -Follow Blood cultures. No growth to date.  -TSH 4.6 -UA negative.  Improved.   Acute on Chronic Hypoxic Respiratory failure.  Chronic obstructive pulmonary disease: Continue Brovana, Incruse -She is requiring 4 L oxygen  to keep sat 93 % on 11/18. She usually uses 2 L oxygen .  -Will stop D 10. Will give One dose IV lasix .  -Check Chest x ray. Incentive spirometry.   Altered  mental status: -in setting of hypoglycemia.  -Resolved   Hypertension: -Continue with lasix .   Bradycardia: -Asymptomatic. Chronic.  Exacerbated by hypothermia.   Grade 1 diastolic dysfunction: -Continue Lasix  twice daily Worsening Hypoxia. Will Give afternoon dose of lasix  IV> Check Chest x ray and adjust further.   Gout: Continue Allopurinol .  Hyperlipidemia: Continue atorvastatin .  Normocytic anemia: Monitor hemoglobin.  Chronic pain: Continue pain management.  Elevated alkaline phosphatase: Chronic previous level 166- follow up out patient  -Down to 176.  Elevated serum creatinine: CKD IIIa: Creatinine range 0.8-1.0     Estimated body mass index is 33.02 kg/m as calculated from the following:   Height as of this encounter: 5' 2 (1.575 m).   Weight as of this encounter: 81.9 kg.   DVT prophylaxis: Lovenox Code Status: Full code Family Communication: care discussed with patient. No family at bedside.  Disposition Plan:  Status is: Observation The patient remains OBS appropriate and will d/c before 2 midnights.    Consultants:  none  Procedures:  none  Antimicrobials:    Subjective: She is alert, conversant, oriented times 3.  Report breakfast was cold, bagel hard.  She denies dyspnea. She is requiring 4 L today.  Report no cough.   Objective: Vitals:   11/02/24 0600 11/02/24 0700 11/02/24 0805 11/02/24 0806  BP: (!) 142/51 (!) 136/39    Pulse: (!) 49 (!) 54    Resp: 13 18    Temp: (!) 97.5 F (36.4 C) (!) 97.5 F (36.4 C)    TempSrc:      SpO2: 93%  94% 93% 92%  Weight:      Height:        Intake/Output Summary (Last 24 hours) at 11/02/2024 0903 Last data filed at 11/01/2024 1600 Gross per 24 hour  Intake 651.39 ml  Output 1250 ml  Net -598.61 ml   Filed Weights   10/31/24 1058 10/31/24 1529  Weight: 77.6 kg 81.9 kg    Examination:  General exam: NAD Respiratory system: Normal resp effort, BL crackles.  Cardiovascular  system: S 1, S 2 RRR Gastrointestinal system: BS present, soft, nt Central nervous system:alert Extremities: no edema    Data Reviewed: I have personally reviewed following labs and imaging studies  CBC: Recent Labs  Lab 10/31/24 1218 11/01/24 0304  WBC 8.2 8.0  NEUTROABS 7.3  --   HGB 11.2* 10.9*  HCT 35.3* 34.0*  MCV 91.2 91.9  PLT 228 203   Basic Metabolic Panel: Recent Labs  Lab 10/31/24 1219 11/01/24 0304  NA 140 137  K 4.2 4.3  CL 101 100  CO2 29 27  GLUCOSE 111* 106*  BUN 40* 31*  CREATININE 1.31* 1.00  CALCIUM  10.2 9.3   GFR: Estimated Creatinine Clearance: 46 mL/min (by C-G formula based on SCr of 1 mg/dL). Liver Function Tests: Recent Labs  Lab 10/31/24 1219 11/01/24 0304  AST 33 31  ALT 19 17  ALKPHOS 211* 176*  BILITOT 0.3 0.2  PROT 7.4 6.3*  ALBUMIN 4.1 3.5   No results for input(s): LIPASE, AMYLASE in the last 168 hours. No results for input(s): AMMONIA in the last 168 hours. Coagulation Profile: Recent Labs  Lab 10/31/24 1218  INR 1.0   Cardiac Enzymes: No results for input(s): CKTOTAL, CKMB, CKMBINDEX, TROPONINI in the last 168 hours. BNP (last 3 results) Recent Labs    01/21/24 1114  PROBNP 2,141*   HbA1C: No results for input(s): HGBA1C in the last 72 hours. CBG: Recent Labs  Lab 11/01/24 1641 11/01/24 1949 11/01/24 2332 11/02/24 0345 11/02/24 0736  GLUCAP 134* 163* 169* 140* 131*   Lipid Profile: No results for input(s): CHOL, HDL, LDLCALC, TRIG, CHOLHDL, LDLDIRECT in the last 72 hours. Thyroid  Function Tests: Recent Labs    10/31/24 1539 11/01/24 0742  TSH 4.640*  --   FREET4  --  0.76   Anemia Panel: Recent Labs    11/01/24 0742  VITAMINB12 867   Sepsis Labs: Recent Labs  Lab 10/31/24 1211  LATICACIDVEN 1.1    Recent Results (from the past 240 hours)  Blood Culture (routine x 2)     Status: None (Preliminary result)   Collection Time: 10/31/24 12:15 PM   Specimen:  BLOOD  Result Value Ref Range Status   Specimen Description   Final    BLOOD RIGHT ANTECUBITAL Performed at [facility], [address], [place], [place] [address]    Special Requests   Final    BOTTLES DRAWN AEROBIC AND ANAEROBIC Blood Culture adequate volume Performed at [facility], [address], [place], [place] [address]    Culture   Final    NO GROWTH 2 DAYS Performed at [facility], [address] [place], [place] [address]    Report Status PENDING  Incomplete  Blood Culture (routine x 2)     Status: None (Preliminary result)   Collection Time: 10/31/24 12:16 PM   Specimen: BLOOD  Result Value Ref Range Status   Specimen Description   Final    BLOOD SITE NOT SPECIFIED Performed at  [facility], [address] [place], [place] [address]    Special Requests   Final    BOTTLES DRAWN AEROBIC AND ANAEROBIC Blood Culture adequate volume Performed at [facility], [address], [place], [place] [address]    Culture   Final    NO GROWTH 2 DAYS Performed at [facility], [address], [place], [place] [address]    Report Status PENDING  Incomplete  Resp panel by RT-PCR (RSV, Flu A&B, Covid) Anterior Nasal Swab     Status: None   Collection Time: 10/31/24 12:17 PM   Specimen: Anterior Nasal Swab  Result Value Ref Range Status   SARS Coronavirus 2 by RT PCR NEGATIVE NEGATIVE Final    Comment: (NOTE) SARS-CoV-2 target nucleic acids are NOT DETECTED.  The SARS-CoV-2 RNA is generally detectable in upper respiratory specimens during the acute phase of infection. The lowest concentration of SARS-CoV-2 viral copies this assay can detect is 138 copies/mL. A negative result does not preclude SARS-Cov-2 infection and should not be used as the sole basis for treatment or other patient management decisions. A negative result may occur with  improper specimen collection/handling,  submission of specimen other than nasopharyngeal swab, presence of viral mutation(s) within the areas targeted by this assay, and inadequate number of viral copies(<138 copies/mL). A negative result must be combined with clinical observations, patient history, and epidemiological information. The expected result is Negative.  Fact Sheet for Patients:  bloggercourse.com  Fact Sheet for Healthcare Providers:  seriousbroker.it  This test is no t yet approved or cleared by the [place]  FDA and  has been authorized for detection and/or diagnosis of SARS-CoV-2 by FDA under an Emergency Use Authorization (EUA). This EUA will remain  in effect (meaning this test can be used) for the duration of the COVID-19 declaration under Section 564(b)(1) of the Act, 21 U.S.C.section 360bbb-3(b)(1), unless the authorization is terminated  or revoked sooner.       Influenza A by PCR NEGATIVE NEGATIVE Final   Influenza B by PCR NEGATIVE NEGATIVE Final    Comment: (NOTE) The Xpert Xpress SARS-CoV-2/FLU/RSV plus assay is intended as an aid in the diagnosis of influenza from Nasopharyngeal swab specimens and should not be used as a sole basis for treatment. Nasal washings and aspirates are unacceptable for Xpert Xpress SARS-CoV-2/FLU/RSV testing.  Fact Sheet for Patients: bloggercourse.com  Fact Sheet for Healthcare Providers: seriousbroker.it  This test is not yet approved or cleared by the [place]  FDA and has been authorized for detection and/or diagnosis of SARS-CoV-2 by FDA under an Emergency Use Authorization (EUA). This EUA will remain in effect (meaning this test can be used) for the duration of the COVID-19 declaration under Section 564(b)(1) of the Act, 21 U.S.C. section 360bbb-3(b)(1), unless the authorization is terminated or revoked.     Resp Syncytial Virus by PCR NEGATIVE  NEGATIVE Final    Comment: (NOTE) Fact Sheet for Patients: bloggercourse.com  Fact Sheet for Healthcare Providers: seriousbroker.it  This test is not yet approved or cleared by the [place]  FDA and has been authorized for detection and/or diagnosis of SARS-CoV-2 by FDA under an Emergency Use Authorization (EUA). This EUA will remain in effect (meaning this test can be used) for the duration of the COVID-19 declaration under Section 564(b)(1) of the Act, 21 U.S.C. section 360bbb-3(b)(1), unless the authorization is terminated or revoked.  Performed at [facility], [address] [place], [place] [address]  Radiology Studies: DG Chest Port 1 View Result Date: 10/31/2024 EXAM: 1 VIEW(S) XRAY OF THE CHEST 10/31/2024 11:46:00 AM COMPARISON: 02/13/2024 CLINICAL HISTORY: Questionable sepsis - evaluate for abnormality. FINDINGS: LUNGS AND PLEURA: Unchanged linear scarring in the left lung base. No focal pulmonary opacity. No pleural effusion. No pneumothorax. HEART AND MEDIASTINUM: No acute abnormality of the cardiac and mediastinal silhouettes. BONES AND SOFT TISSUES: Degenerative changes noted within the thoracic spine. No acute osseous abnormality. IMPRESSION: 1. No acute cardiopulmonary abnormality. Electronically signed by: Waddell Calk MD 10/31/2024 11:51 AM EST RP Workstation: [id number]        Scheduled Meds:  allopurinol   150 mg Oral Daily   arformoterol  15 mcg Nebulization BID   And   umeclidinium bromide  1 puff Inhalation Daily   aspirin  EC  81 mg Oral Daily   atorvastatin   40 mg Oral Daily   Chlorhexidine  Gluconate Cloth  6 each Topical Q2200   cholecalciferol   2,000 Units Oral Daily   dextrose   25 g Intravenous Once   enoxaparin (LOVENOX) injection  40 mg Subcutaneous Q24H   furosemide   40 mg Oral BID   hydroxychloroquine   400 mg Oral Daily   potassium chloride   10 mEq Oral BID    saccharomyces boulardii  250 mg Oral Daily   Continuous Infusions:     LOS: 1 day    Time spent: 35 Minutes,.     Owen DELENA Lore, MD [facility]   If 7PM-7AM, please contact night-coverage [email or web address]  11/02/2024, 9:03 AM

## 2024-11-02 NOTE — Plan of Care (Signed)

## 2024-11-03 ENCOUNTER — Other Ambulatory Visit (HOSPITAL_COMMUNITY): Payer: Self-pay

## 2024-11-03 ENCOUNTER — Telehealth (HOSPITAL_COMMUNITY): Payer: Self-pay

## 2024-11-03 DIAGNOSIS — E162 Hypoglycemia, unspecified: Secondary | ICD-10-CM | POA: Diagnosis not present

## 2024-11-03 LAB — BASIC METABOLIC PANEL WITH GFR
Anion gap: 11 (ref 5–15)
BUN: 31 mg/dL — ABNORMAL HIGH (ref 8–23)
CO2: 27 mmol/L (ref 22–32)
Calcium: 9.4 mg/dL (ref 8.9–10.3)
Chloride: 95 mmol/L — ABNORMAL LOW (ref 98–111)
Creatinine, Ser: 1.29 mg/dL — ABNORMAL HIGH (ref 0.44–1.00)
GFR, Estimated: 42 mL/min — ABNORMAL LOW (ref >60)
Glucose, Bld: 114 mg/dL — ABNORMAL HIGH (ref 70–99)
Potassium: 3.9 mmol/L (ref 3.5–5.1)
Sodium: 132 mmol/L — ABNORMAL LOW (ref 135–145)

## 2024-11-03 LAB — GLUCOSE, CAPILLARY
Glucose-Capillary: 104 mg/dL — ABNORMAL HIGH (ref 70–99)
Glucose-Capillary: 104 mg/dL — ABNORMAL HIGH (ref 70–99)
Glucose-Capillary: 120 mg/dL — ABNORMAL HIGH (ref 70–99)

## 2024-11-03 MED ORDER — HYDRALAZINE HCL 20 MG/ML IJ SOLN: 10.0000 mg | INTRAMUSCULAR | Status: DC | PRN

## 2024-11-03 MED ORDER — METOPROLOL TARTRATE 5 MG/5ML IV SOLN: 5.0000 mg | INTRAVENOUS | Status: DC | PRN

## 2024-11-03 MED ORDER — AMLODIPINE BESYLATE 5 MG PO TABS
5.0000 mg | ORAL_TABLET | Freq: Every day | ORAL | Status: DC
  Administered 2024-11-03 – 2024-11-04 (×2): 5 mg via ORAL
  Filled 2024-11-03 (×2): qty 1

## 2024-11-03 MED ORDER — DIPHENHYDRAMINE HCL 25 MG PO CAPS
25.0000 mg | ORAL_CAPSULE | Freq: Once | ORAL | Status: AC | PRN
  Administered 2024-11-03: 25 mg via ORAL
  Filled 2024-11-03: qty 1

## 2024-11-03 MED ORDER — FAMOTIDINE 20 MG PO TABS
20.0000 mg | ORAL_TABLET | Freq: Every day | ORAL | Status: DC
  Administered 2024-11-03 – 2024-11-04 (×2): 20 mg via ORAL
  Filled 2024-11-03 (×2): qty 1

## 2024-11-03 MED ORDER — GLUCAGON HCL RDNA (DIAGNOSTIC) 1 MG IJ SOLR: 1.0000 mg | INTRAMUSCULAR | Status: DC | PRN

## 2024-11-03 MED ORDER — VITAMIN C 500 MG PO TABS
1000.0000 mg | ORAL_TABLET | Freq: Every day | ORAL | Status: DC
  Administered 2024-11-03 – 2024-11-04 (×2): 1000 mg via ORAL
  Filled 2024-11-03 (×2): qty 2

## 2024-11-03 NOTE — Evaluation (Signed)
 Physical Therapy Evaluation Patient Details Name: Deborah Bryant MRN: [id number] DOB: 1946-11-28 Today's Date: 11/03/2024  History of Present Illness  78 year old presented  by EMS after patient was found unresponsive by spouse, Admited  hypoglycemia, hypothermic,.PMH:r anxiety, history of MRSA periorbital cellulitis, chronic kidney disease, COPD, on 2 L, diabetes type 2, GERD, hyperlipidemia, hypertension, IBS  Clinical Impression  Pt admitted with above diagnosis.  Pt currently with functional limitations due to the deficits listed below (see PT Problem List). Pt will benefit from acute skilled PT to increase their independence and safety with mobility to allow discharge.     The patient  is eager to ambulate. Patient does  present with weakness, gait slow. Patient reports feeling stiff.  Patient resides with spouse and is independent at baseline, using RW as needed. Patient's nephew supportive.  Patient on  Home O2 at 2 L.  Patient  ambulated x 90' on 3L, spo2 dropped down to 85 %(was talking and encouraged not to).  Returned to 95% with rest. Recommend HHPT at DC with family support.      If plan is discharge home, recommend the following: A little help with walking and/or transfers;A little help with bathing/dressing/bathroom;Assistance with cooking/housework;Help with stairs or ramp for entrance   Can travel by private vehicle        Equipment Recommendations None recommended by PT  Recommendations for Other Services       Functional Status Assessment Patient has had a recent decline in their functional status and demonstrates the ability to make significant improvements in function in a reasonable and predictable amount of time.     Precautions / Restrictions Precautions Precautions: Fall Precaution/Restrictions Comments: Know CBG Restrictions Weight Bearing Restrictions Per Provider Order: No      Mobility  Bed Mobility               General bed mobility  comments: in recliner    Transfers Overall transfer level: Needs assistance Equipment used: Rolling walker (2 wheels) Transfers: Sit to/from Stand Sit to Stand: Contact guard assist           General transfer comment: extra effort, stiff/sore    Ambulation/Gait Ambulation/Gait assistance: Min assist Gait Distance (Feet): 90 Feet Assistive device: Rolling walker (2 wheels) Gait Pattern/deviations: Step-through pattern Gait velocity: decr     General Gait Details: gait is slow, mildly unsteady and English As A Second Language Teacher     Tilt Bed    Modified Rankin (Stroke Patients Only)       Balance Overall balance assessment: Needs assistance Sitting-balance support: Bilateral upper extremity supported, Feet supported Sitting balance-Leahy Scale: Good     Standing balance support: Reliant on assistive device for balance, During functional activity, Bilateral upper extremity supported Standing balance-Leahy Scale: Poor                               Pertinent Vitals/Pain Pain Assessment Pain Assessment: Faces Faces Pain Scale: Hurts little more Pain Location: generalized, stiffness Pain Descriptors / Indicators: Discomfort Pain Intervention(s): Monitored during session    Home Living Family/patient expects to be discharged to:: Private residence Living Arrangements: Spouse/significant other Available Help at Discharge: Family;Available PRN/intermittently Type of Home: House Home Access: Stairs to enter   Entrance Stairs-Number of Steps: 1   Home Layout: One level Home Equipment: Grab bars - tub/shower;Rolling Walker (2 wheels);Cane - single point;Shower  seat - built in Additional Comments: Pt lives with husband that she needs to A for some things. Nephew helps them as needed.    Prior Function Prior Level of Function : Independent/Modified Independent;Driving             Mobility Comments: does not usually use an  AD, independent ADLs Comments: independent     Extremity/Trunk Assessment   Upper Extremity Assessment Upper Extremity Assessment: Defer to OT evaluation    Lower Extremity Assessment Lower Extremity Assessment: Generalized weakness    Cervical / Trunk Assessment Cervical / Trunk Assessment: Kyphotic  Communication   Communication Communication: No apparent difficulties    Cognition Arousal: Alert Behavior During Therapy: WFL for tasks assessed/performed   PT - Cognitive impairments: No apparent impairments                         Following commands: Intact       Cueing       General Comments      Exercises     Assessment/Plan    PT Assessment Patient needs continued PT services  PT Problem List Decreased strength;Decreased activity tolerance;Decreased balance;Decreased mobility;Cardiopulmonary status limiting activity;Decreased knowledge of precautions;Decreased safety awareness       PT Treatment Interventions DME instruction;Gait training;Functional mobility training;Therapeutic activities;Therapeutic exercise;Patient/family education    PT Goals (Current goals can be found in the Care Plan section)  Acute Rehab PT Goals Patient Stated Goal: to get stronger an go home PT Goal Formulation: With patient/family Time For Goal Achievement: 11/17/24 Potential to Achieve Goals: Good    Frequency Min 3X/week     Co-evaluation               AM-PAC PT 6 Clicks Mobility  Outcome Measure Help needed turning from your back to your side while in a flat bed without using bedrails?: A Little Help needed moving from lying on your back to sitting on the side of a flat bed without using bedrails?: A Little Help needed moving to and from a bed to a chair (including a wheelchair)?: A Little Help needed standing up from a chair using your arms (e.g., wheelchair or bedside chair)?: A Little Help needed to walk in hospital room?: A Little Help needed  climbing 3-5 steps with a railing? : A Lot 6 Click Score: 17    End of Session Equipment Utilized During Treatment: Gait belt;Oxygen  Activity Tolerance: Patient limited by fatigue;Patient tolerated treatment well Patient left: in chair;with call bell/phone within reach;with chair alarm set;with family/visitor present Nurse Communication: Mobility status PT Visit Diagnosis: Unsteadiness on feet (R26.81);Muscle weakness (generalized) (M62.81)    Time: 9151-9081 PT Time Calculation (min) (ACUTE ONLY): 30 min   Charges:   PT Evaluation $PT Eval Low Complexity: 1 Low PT Treatments $Gait Training: 8-22 mins PT General Charges $$ ACUTE PT VISIT: 1 Visit         Darice Potters PT Acute Rehabilitation Services Office [phone]   Potters Darice Norris 11/03/2024, 1:52 PM

## 2024-11-03 NOTE — Progress Notes (Signed)
 Pt arrived to unit ~2230, on 3LNC. Telemetry initiated. Discussed POC. Pt able to ambulate with difficulty to bed, c/o arthritis, chronic pain and weakness. Legs noted to be weak. Skin intact aside from multiple bruises from sugar checks' and 'labs'. Pt refuses CBG check, non ordered. Pt states she has new CGM, visible and verified, on LUA and shows phone with monitoring app. Blood glucose reading 127 at this time. Limb alert placed on L arm as pt refuses BP on L arm.   Pt states she has poor appetite and not much interest in eating currently but has had issues in the past d/t multiple factors: She lost sense of taste and smell in 2018 after back surgery, pt has no desire to cook anymore so unless its a scrambled egg and english muffin for breakfast, sandwich for lunch or lean cuisine for dinner, pt doesn't cook.   Telemetry called @2350  stating pt HR maintains 49 to mid 50's. Telemetry showing 1st degree AVB with frequent PVC's. Looking back, tele monitor states PR has been between 0.20 and .25 since admission. Pt sleeping at this time.

## 2024-11-03 NOTE — Telephone Encounter (Signed)
 Pharmacy Patient Advocate Encounter  Insurance verification completed.    The patient is insured through [facility]. Patient has Medicare and is not eligible for a copay card, but may be able to apply for patient assistance or Medicare RX Payment Plan (Patient Must reach out to their plan, if eligible for payment plan), if available.    Ran test claim for Tradjenta 5mg  and the current 30 day co-pay is $89.62.   This test claim was processed through [facility]- copay amounts may vary at other pharmacies due to pharmacy/plan contracts, or as the patient moves through the different stages of their insurance plan.

## 2024-11-03 NOTE — Progress Notes (Signed)
 PROGRESS NOTE    Deborah Bryant  FMW:[id number] DOB: 05/27/1946 DOA: 10/31/2024 PCP: Johnny Garnette LABOR, MD    Brief Narrative:   78 year old with past medical history significant for anxiety, history of MRSA periorbital cellulitis, chronic kidney disease, COPD, diabetes type 2, GERD, hyperlipidemia, hypertension, IBS who presented by EMS after patient was found unresponsive by husband.  Husband started CPR on EMS arrival as  patient was hypoglycemic.  Patient was taking glipizide , and was having poor oral intake.  Upon admission patient was found to be hypothermic and hypoglycemic.  Initially had altered mental status which resolved with improvement in blood glucose.    Evaluation in the ED found to have normal electrolytes, glucose 111, BUN 40, creatinine 1.3, coronavirus influenza and RSV negative.  Portable chest x-ray no acute cardiopulmonary abnormality.  She was found to be hypothermic with a temperature of 91 and she was placed on Bair hugger and admitted for further care.  Assessment & Plan:   Hypoglycemia and hypothermia Acute metabolic encephalopathy, resolved -No obvious evidence of infection noted.  Patient is on glipizide  at home.  For now this has been discontinued.  Slightly elevated TSH but normal free T4. Normal Cortisol.  Cultures remain negative. -A1c 2 months ago 6.7    Acute on Chronic Hypoxic Respiratory failure.  Chronic obstructive pulmonary disease: Chronically on 2 L nasal cannula, currently on 4 L slowly improving.  Continue bronchodilators.  Chest x-ray showing atelectasis, will advise to aggressively use incentive spirometer    Hypertension Congestive heart failure with diastolic CHF -Continue Norvasc .  Hold Lasix  today -Echo January 2025 showing preserved EF with grade 1 DD   Bradycardia: -Asymptomatic. Chronic.  Exacerbated by hypothermia.     Gout: Continue Allopurinol .   Hyperlipidemia: Continue atorvastatin .   Normocytic anemia: Monitor  hemoglobin.   Chronic pain: Continue pain management.   Elevated alkaline phosphatase: Chronic previous level 166- follow up out patient  -Down to 176.   AKI on CKD stage II Baseline creatinine 1.0, today 1.2     Estimated body mass index is 33.02 kg/m as calculated from the following:   Height as of this encounter: 5' 2 (1.575 m).   Weight as of this encounter: 81.9 kg.    DVT prophylaxis: enoxaparin  (LOVENOX ) injection 40 mg Start: 10/31/24 1700      Code Status: Full Code Family Communication:   Status is: Inpatient Remains inpatient appropriate because: Continue hospital stay   PT Follow up Recs:   Subjective: Feeling well no complaints. Husband and son at bedside   Examination:  General exam: Appears calm and comfortable, 3L nasal cannula Respiratory system: Clear to auscultation. Respiratory effort normal. Cardiovascular system: S1 & S2 heard, RRR. No JVD, murmurs, rubs, gallops or clicks. No pedal edema. Gastrointestinal system: Abdomen is nondistended, soft and nontender. No organomegaly or masses felt. Normal bowel sounds heard. Central nervous system: Alert and oriented. No focal neurological deficits. Extremities: Symmetric 5 x 5 power. Skin: No rashes, lesions or ulcers Psychiatry: Judgement and insight appear normal. Mood & affect appropriate.                Diet Orders (From admission, onward)     Start     Ordered   11/01/24 0729  Diet regular Room service appropriate? Yes; Fluid consistency: Thin  Diet effective now       Question Answer Comment  Room service appropriate? Yes   Fluid consistency: Thin      11/01/24 0728  Objective: Vitals:   11/03/24 0000 11/03/24 0400 11/03/24 0700 11/03/24 1145  BP: (!) 157/46 (!) 131/33 (!) 136/58   Pulse: (!) 54 (!) 52 (!) 58   Resp: 12 14 19    Temp: 97.7 F (36.5 C) 97.9 F (36.6 C) 98.1 F (36.7 C) (!) 97.5 F (36.4 C)  TempSrc: Bladder Bladder  Oral  SpO2: 93% 92%  91%   Weight:      Height:        Intake/Output Summary (Last 24 hours) at 11/03/2024 1234 Last data filed at 11/03/2024 0705 Gross per 24 hour  Intake 240 ml  Output 3450 ml  Net -3210 ml   Filed Weights   10/31/24 1058 10/31/24 1529  Weight: 77.6 kg 81.9 kg    Scheduled Meds:  allopurinol   150 mg Oral Daily   amLODipine   5 mg Oral Daily   arformoterol   15 mcg Nebulization BID   And   umeclidinium bromide   1 puff Inhalation Daily   vitamin C   1,000 mg Oral Daily   aspirin  EC  81 mg Oral Daily   atorvastatin   40 mg Oral Daily   Chlorhexidine  Gluconate Cloth  6 each Topical Q2200   cholecalciferol   2,000 Units Oral Daily   dextrose   25 g Intravenous Once   enoxaparin  (LOVENOX ) injection  40 mg Subcutaneous Q24H   famotidine   20 mg Oral Daily   hydroxychloroquine   400 mg Oral Daily   potassium chloride   10 mEq Oral BID   saccharomyces boulardii  250 mg Oral Daily   Continuous Infusions:  Nutritional status     Body mass index is 33.02 kg/m.  Data Reviewed:   CBC: Recent Labs  Lab 10/31/24 1218 11/01/24 0304 11/02/24 1042  WBC 8.2 8.0 8.6  NEUTROABS 7.3  --   --   HGB 11.2* 10.9* 10.2*  HCT 35.3* 34.0* 30.9*  MCV 91.2 91.9 88.0  PLT 228 203 226   Basic Metabolic Panel: Recent Labs  Lab 10/31/24 1219 11/01/24 0304 11/02/24 1042 11/03/24 0306  NA 140 137 130* 132*  K 4.2 4.3 4.4 3.9  CL 101 100 94* 95*  CO2 29 27 26 27   GLUCOSE 111* 106* 124* 114*  BUN 40* 31* 28* 31*  CREATININE 1.31* 1.00 1.14* 1.29*  CALCIUM  10.2 9.3 9.1 9.4  MG  --   --  1.9  --    GFR: Estimated Creatinine Clearance: 35.6 mL/min (A) (by C-G formula based on SCr of 1.29 mg/dL (H)). Liver Function Tests: Recent Labs  Lab 10/31/24 1219 11/01/24 0304  AST 33 31  ALT 19 17  ALKPHOS 211* 176*  BILITOT 0.3 0.2  PROT 7.4 6.3*  ALBUMIN 4.1 3.5   No results for input(s): LIPASE, AMYLASE in the last 168 hours. No results for input(s): AMMONIA in the last 168  hours. Coagulation Profile: Recent Labs  Lab 10/31/24 1218  INR 1.0   Cardiac Enzymes: No results for input(s): CKTOTAL, CKMB, CKMBINDEX, TROPONINI in the last 168 hours. BNP (last 3 results) Recent Labs    01/21/24 1114  PROBNP 2,141*   HbA1C: No results for input(s): HGBA1C in the last 72 hours. CBG: Recent Labs  Lab 11/02/24 1933 11/02/24 2322 11/03/24 0314 11/03/24 0745 11/03/24 1126  GLUCAP 159* 110* 104* 104* 120*   Lipid Profile: No results for input(s): CHOL, HDL, LDLCALC, TRIG, CHOLHDL, LDLDIRECT in the last 72 hours. Thyroid  Function Tests: Recent Labs    10/31/24 1539 11/01/24 0742  TSH 4.640*  --  FREET4  --  0.76  T3FREE  --  2.1   Anemia Panel: Recent Labs    11/01/24 0742  VITAMINB12 867   Sepsis Labs: Recent Labs  Lab 10/31/24 1211  LATICACIDVEN 1.1    Recent Results (from the past 240 hours)  Blood Culture (routine x 2)     Status: None (Preliminary result)   Collection Time: 10/31/24 12:15 PM   Specimen: BLOOD  Result Value Ref Range Status   Specimen Description   Final    BLOOD RIGHT ANTECUBITAL Performed at [facility], [address], [place], [place] [address]    Special Requests   Final    BOTTLES DRAWN AEROBIC AND ANAEROBIC Blood Culture adequate volume Performed at [facility], [address], [place], [place] [address]    Culture   Final    NO GROWTH 3 DAYS Performed at [facility], [address] [place], [place] [address]    Report Status PENDING  Incomplete  Blood Culture (routine x 2)     Status: None (Preliminary result)   Collection Time: 10/31/24 12:16 PM   Specimen: BLOOD  Result Value Ref Range Status   Specimen Description   Final    BLOOD SITE NOT SPECIFIED Performed at [facility], [address], [place], [place] [address]    Special Requests   Final    BOTTLES DRAWN AEROBIC AND ANAEROBIC Blood Culture adequate  volume Performed at [facility], [address] [place], [place] [address]    Culture   Final    NO GROWTH 3 DAYS Performed at [facility], [address], [place], [place] [address]    Report Status PENDING  Incomplete  Resp panel by RT-PCR (RSV, Flu A&B, Covid) Anterior Nasal Swab     Status: None   Collection Time: 10/31/24 12:17 PM   Specimen: Anterior Nasal Swab  Result Value Ref Range Status   SARS Coronavirus 2 by RT PCR NEGATIVE NEGATIVE Final    Comment: (NOTE) SARS-CoV-2 target nucleic acids are NOT DETECTED.  The SARS-CoV-2 RNA is generally detectable in upper respiratory specimens during the acute phase of infection. The lowest concentration of SARS-CoV-2 viral copies this assay can detect is 138 copies/mL. A negative result does not preclude SARS-Cov-2 infection and should not be used as the sole basis for treatment or other patient management decisions. A negative result may occur with  improper specimen collection/handling, submission of specimen other than nasopharyngeal swab, presence of viral mutation(s) within the areas targeted by this assay, and inadequate number of viral copies(<138 copies/mL). A negative result must be combined with clinical observations, patient history, and epidemiological information. The expected result is Negative.  Fact Sheet for Patients:  bloggercourse.com  Fact Sheet for Healthcare Providers:  seriousbroker.it  This test is no t yet approved or cleared by the [place]  FDA and  has been authorized for detection and/or diagnosis of SARS-CoV-2 by FDA under an Emergency Use Authorization (EUA). This EUA will remain  in effect (meaning this test can be used) for the duration of the COVID-19 declaration under Section 564(b)(1) of the Act, 21 U.S.C.section 360bbb-3(b)(1), unless the authorization is terminated  or revoked sooner.       Influenza A by  PCR NEGATIVE NEGATIVE Final   Influenza B by PCR NEGATIVE NEGATIVE Final    Comment: (NOTE) The Xpert Xpress SARS-CoV-2/FLU/RSV plus assay is intended as an aid in the diagnosis of influenza  from Nasopharyngeal swab specimens and should not be used as a sole basis for treatment. Nasal washings and aspirates are unacceptable for Xpert Xpress SARS-CoV-2/FLU/RSV testing.  Fact Sheet for Patients: bloggercourse.com  Fact Sheet for Healthcare Providers: seriousbroker.it  This test is not yet approved or cleared by the [place]  FDA and has been authorized for detection and/or diagnosis of SARS-CoV-2 by FDA under an Emergency Use Authorization (EUA). This EUA will remain in effect (meaning this test can be used) for the duration of the COVID-19 declaration under Section 564(b)(1) of the Act, 21 U.S.C. section 360bbb-3(b)(1), unless the authorization is terminated or revoked.     Resp Syncytial Virus by PCR NEGATIVE NEGATIVE Final    Comment: (NOTE) Fact Sheet for Patients: bloggercourse.com  Fact Sheet for Healthcare Providers: seriousbroker.it  This test is not yet approved or cleared by the [place]  FDA and has been authorized for detection and/or diagnosis of SARS-CoV-2 by FDA under an Emergency Use Authorization (EUA). This EUA will remain in effect (meaning this test can be used) for the duration of the COVID-19 declaration under Section 564(b)(1) of the Act, 21 U.S.C. section 360bbb-3(b)(1), unless the authorization is terminated or revoked.  Performed at [facility], [address] [place], [place] [address]          Radiology Studies: DG CHEST PORT 1 VIEW Result Date: 11/02/2024 CLINICAL DATA:  Dyspnea EXAM: PORTABLE CHEST 1 VIEW COMPARISON:  Chest radiograph dated 10/31/2024 FINDINGS: Normal lung volumes. Bibasilar linear opacities. No  pleural effusion or pneumothorax. Similar enlarged cardiomediastinal silhouette. No acute osseous abnormality. IMPRESSION: 1. Bibasilar linear opacities, likely atelectasis. 2. Similar cardiomegaly. Electronically Signed   By: Limin  Xu M.D.   On: 11/02/2024 14:02           LOS: 2 days   Time spent= 35 mins    Burgess JAYSON Dare, MD [facility]  If 7PM-7AM, please contact night-coverage  11/03/2024, 12:34 PM

## 2024-11-03 NOTE — Hospital Course (Addendum)
 Brief Narrative:   78 year old with past medical history significant for anxiety, history of MRSA periorbital cellulitis, chronic kidney disease, COPD, diabetes type 2, GERD, hyperlipidemia, hypertension, IBS who presented by EMS after patient was found unresponsive by husband.  Husband started CPR on EMS arrival as  patient was hypoglycemic.  Patient was taking glipizide , and was having poor oral intake.  Upon admission patient was found to be hypothermic and hypoglycemic.  Initially had altered mental status which resolved with improvement in blood glucose.    Evaluation in the ED found to have normal electrolytes, glucose 111, BUN 40, creatinine 1.3, coronavirus influenza and RSV negative.  Portable chest x-ray no acute cardiopulmonary abnormality.  She was found to be hypothermic with a temperature of 91 and she was placed on Bair hugger and admitted for further care.  Eventually after discussing with diabetic or later she was taken off any diabetic medications.  [name] was given.  She significantly well and wishing to go home today.  Assessment & Plan:   Hypoglycemia and hypothermia, resolved Acute metabolic encephalopathy, resolved -No obvious evidence of infection noted.  Patient is on glipizide  at home.  For now this has been discontinued.  Slightly elevated TSH but normal free T4. Normal Cortisol.  Cultures remain negative.  After discussing with diabetic coordinator, no further antidiabetic medications for home for now.  [name] has been given -A1c 2 months ago 6.7    Acute on Chronic Hypoxic Respiratory failure.  Chronic obstructive pulmonary disease: She is back down to 2 L nasal cannula which is her chronic regimen.  Okay to discharge home today    Hypertension Congestive heart failure with diastolic CHF - Zoom home regimen and follow-up outpatient cardiology -Echo January 2025 showing preserved EF with grade 1 DD   Bradycardia: -Asymptomatic. Chronic.  Exacerbated by  hypothermia.     Gout: Continue Allopurinol .   Hyperlipidemia: Continue atorvastatin .   Normocytic anemia: Monitor hemoglobin.   Chronic pain: Continue pain management.   Elevated alkaline phosphatase: Chronic previous level 166- follow up out patient  -Down to 176.   AKI on CKD stage II Baseline creatinine 1.0, today 1.2     Estimated body mass index is 33.02 kg/m as calculated from the following:   Height as of this encounter: 5' 2 (1.575 m).   Weight as of this encounter: 81.9 kg.    DVT prophylaxis: enoxaparin (LOVENOX) injection 40 mg Start: 10/31/24 1700      Code Status: Full Code Family Communication:   Status is: Inpatient Remains inpatient appropriate because: Discharge   PT Follow up Recs: Home Health Pt11/19/2025 1351  Subjective: Seen at bedside wishing to go home.  Family also present   Examination:  General exam: Appears calm and comfortable, 3L nasal cannula Respiratory system: Clear to auscultation. Respiratory effort normal. Cardiovascular system: S1 & S2 heard, RRR. No JVD, murmurs, rubs, gallops or clicks. No pedal edema. Gastrointestinal system: Abdomen is nondistended, soft and nontender. No organomegaly or masses felt. Normal bowel sounds heard. Central nervous system: Alert and oriented. No focal neurological deficits. Extremities: Symmetric 5 x 5 power. Skin: No rashes, lesions or ulcers Psychiatry: Judgement and insight appear normal. Mood & affect appropriate.

## 2024-11-03 NOTE — Plan of Care (Signed)
  Problem: Health Behavior/Discharge Planning: Goal: Ability to manage health-related needs will improve Outcome: Progressing   Problem: Clinical Measurements: Goal: Ability to maintain clinical measurements within normal limits will improve Outcome: Progressing   Problem: Clinical Measurements: Goal: Will remain free from infection Outcome: Progressing   Problem: Clinical Measurements: Goal: Diagnostic test results will improve Outcome: Progressing   

## 2024-11-03 NOTE — Inpatient Diabetes Management (Addendum)
 Inpatient Diabetes Program Recommendations  AACE/ADA: New Consensus Statement on Inpatient Glycemic Control (2015)  Target Ranges:  Prepandial:   less than 140 mg/dL      Peak postprandial:   less than 180 mg/dL (1-2 hours)      Critically ill patients:  140 - 180 mg/dL   Lab Results  Component Value Date   GLUCAP 120 (H) 11/03/2024   HGBA1C 6.7 (H) 08/19/2024    Review of Glycemic Control  Latest Reference Range & Units 11/02/24 19:33 11/02/24 23:22 11/03/24 03:14 11/03/24 07:45 11/03/24 11:26  Glucose-Capillary 70 - 99 mg/dL 840 (H) 889 (H) 895 (H) 104 (H) 120 (H)  (H): Data is abnormally high   Latest Reference Range & Units 11/03/24 03:06  GFR, Estimated >60 mL/min 42 (L)  (L): Data is abnormally low  Diabetes history: DM2 Outpatient Diabetes medications: Glipizide  5 mg QD Current orders for Inpatient glycemic control: CBGs  Admitted with severe hypoglycemia at home.  Met with patient and family at bedside.  Husband was unable to wake her.  When EMS arrived BG was 33 mg/dL.  Was administered dextrose  and became alert and oriented.    Please DC Glipizide .  Would not recommend Metformin at this time due to kidney function.   CBGs trending well inpatient.  Would recommend F/U with PCP in 1-2 weeks and bring her phone with her to PCP visit for review of glucose trends.    Asked TOC to run a benefit check on the [employer] 3 and Tradjenta.  Tradjenta is $89.00 monthly.  FSL3 is run through part B of her Medicare; our [facility] pharmacy is unable to run a benefit check.  Family knows they can get the FSL3 at the pharmacy with the built in coupon for $75/month.  She will need a prescription.    Placed a sample FSL3 on the back of her left arm.  Educated them on use, application and removal.    Discussed following a carb modified diet and eliminating caloric beverages.    Discharge Recommendations: Other recommendations: For discharge:  Please discontinue Glipizide  5 mg.   Follow up  with PCP in 1-2 weeks Needs a Glucometer and supplies.  Please order [name] 3 Order # [id number] Supply/Referral recommendations: Glucometer Test strips Lancet device Lancets   Use Adult Diabetes Insulin  Treatment Post Discharge order set.  Thank you, [name], MSN, CDCES Diabetes Coordinator Inpatient Diabetes Program [phone] (team pager from 8a-5p)

## 2024-11-03 NOTE — Evaluation (Signed)
 Occupational Therapy Evaluation Patient Details Name: Deborah Bryant MRN: [id number] DOB: 19-Sep-1946 Today's Date: 11/03/2024   History of Present Illness   78 year old female who presented  by EMS after patient was found unresponsive by spouse, Admited with  hypoglycemia and hypothermia,.PMH: anxiety, history of MRSA periorbital cellulitis, chronic kidney disease, COPD, on 2 L, diabetes type 2, GERD, hyperlipidemia, hypertension, IBS, arthritis     Clinical Impressions The pt is currently presenting below her baseline level of functioning for self care management. She is limited by the below listed deficits (see OT problem list). During the session today, she required CGA for sit to stand using a RW and for toileting management at bathroom level. She was noted to be with mild deconditioning and slight generalized weakness. She reported as history of chronic RA and OA, causing generalized stiffness and joint pain. She will benefit from further OT services to maximize her independence with daily activities and to decease the risk for restricted participation in meaningful activities. Home health OT vs. no follow-up OT services are recommended.      If plan is discharge home, recommend the following:   Help with stairs or ramp for entrance;Assist for transportation;Assistance with cooking/housework     Functional Status Assessment   Patient has had a recent decline in their functional status and demonstrates the ability to make significant improvements in function in a reasonable and predictable amount of time.     Equipment Recommendations   None recommended by OT     Recommendations for Other Services         Precautions/Restrictions   Precautions Precautions: Fall Restrictions Weight Bearing Restrictions Per Provider Order: No     Mobility Bed Mobility Overal bed mobility: Needs Assistance Bed Mobility: Sit to Supine, Supine to Sit     Supine to sit:  Supervision, HOB elevated, Used rails Sit to supine: Supervision        Transfers Overall transfer level: Needs assistance Equipment used: Rolling walker (2 wheels) Transfers: Sit to/from Stand Sit to Stand: Contact guard assist           Balance     Sitting balance-Leahy Scale: Good         Standing balance comment: CGA with RW          ADL either performed or assessed with clinical judgement   ADL Overall ADL's : Needs assistance/impaired Eating/Feeding: Independent;Sitting   Grooming: Contact guard assist;Standing Grooming Details (indicate cue type and reason): at sink level Upper Body Bathing: Set up;Sitting   Lower Body Bathing: Contact guard assist;Sit to/from stand;Sitting/lateral leans   Upper Body Dressing : Set up;Sitting   Lower Body Dressing: Contact guard assist;Sitting/lateral leans;Sit to/from stand   Toilet Transfer: Contact guard assist;Ambulation;Rolling walker (2 wheels);Grab bars Toilet Transfer Details (indicate cue type and reason): She transfered onto and off the toilet in her room. Toileting- Clothing Manipulation and Hygiene: Contact guard assist;Sit to/from stand Toileting - Clothing Manipulation Details (indicate cue type and reason): She required steadying assist for dynamic toileting management tasks.              Pertinent Vitals/Pain Pain Assessment Pain Assessment: Faces Pain Score: 4  Pain Location: chronic generalized pain she attributes to chronic RA and OA Pain Intervention(s): Limited activity within patient's tolerance, Monitored during session, Repositioned     Extremity/Trunk Assessment Upper Extremity Assessment Upper Extremity Assessment: Overall WFL for tasks assessed;Right hand dominant;RUE deficits/detail;LUE deficits/detail RUE Deficits / Details: AROM WFL. Gross strength 4+/5 LUE Deficits /  Details: AROM WFL. Gross strength 4+/5   Lower Extremity Assessment Lower Extremity Assessment: RLE  deficits/detail;LLE deficits/detail;Generalized weakness RLE Deficits / Details: AROM WFL LLE Deficits / Details: AROM WFL       Communication Communication Communication: No apparent difficulties   Cognition Arousal: Alert Behavior During Therapy: WFL for tasks assessed/performed               OT - Cognition Comments: Oriented x4      Following commands: Intact       Cueing  General Comments   Cueing Techniques: Verbal cues              Home Living Family/patient expects to be discharged to:: Private residence Living Arrangements: Spouse/significant other Available Help at Discharge: Family Type of Home: House Home Access: Stairs to enter Secretary/administrator of Steps: 1   Home Layout: One level     Bathroom Shower/Tub: Walk-in shower         Home Equipment: Agricultural Consultant (2 wheels);Shower seat          Prior Functioning/Environment Prior Level of Function : Independent/Modified Independent;Driving             Mobility Comments:  (She used a RW when first waking up in the a.m. due to generalized stiffness from arthritis, otherwise she did not use an assistive device.) ADLs Comments:  (She was independent with ADLs and performing light meal prep. They had hired assist twice a month for cleaning.)    OT Problem List: Decreased strength;Impaired balance (sitting and/or standing);Decreased coordination;Pain   OT Treatment/Interventions: Self-care/ADL training;Therapeutic exercise;Therapeutic activities;Energy conservation;DME and/or AE instruction;Patient/family education;Balance training      OT Goals(Current goals can be found in the care plan section)   Acute Rehab OT Goals Patient Stated Goal: to get stronger and to return home OT Goal Formulation: With patient Time For Goal Achievement: 11/17/24 Potential to Achieve Goals: Good ADL Goals Pt Will Perform Grooming: with set-up;standing Pt Will Perform Lower Body Dressing: with  set-up;sitting/lateral leans;sit to/from stand Pt Will Transfer to Toilet: with set-up;with supervision;ambulating Pt Will Perform Toileting - Clothing Manipulation and hygiene: with set-up;with supervision;sit to/from stand   OT Frequency:  Min 2X/week       AM-PAC OT 6 Clicks Daily Activity     Outcome Measure Help from another person eating meals?: None Help from another person taking care of personal grooming?: A Little Help from another person toileting, which includes using toliet, bedpan, or urinal?: A Little Help from another person bathing (including washing, rinsing, drying)?: A Little Help from another person to put on and taking off regular upper body clothing?: A Little Help from another person to put on and taking off regular lower body clothing?: A Little 6 Click Score: 19   End of Session Equipment Utilized During Treatment: Gait belt;Rolling walker (2 wheels);Oxygen  Nurse Communication: Mobility status  Activity Tolerance: Patient tolerated treatment well Patient left: in bed;with call bell/phone within reach;with bed alarm set  OT Visit Diagnosis: Muscle weakness (generalized) (M62.81);Pain;Other abnormalities of gait and mobility (R26.89) Pain - part of body:  (generalized due to arthritis)                Time: 8497-8478 OT Time Calculation (min): 19 min Charges:  OT General Charges $OT Visit: 1 Visit OT Evaluation $OT Eval Moderate Complexity: 1 Mod    Quintez Maselli J Harris, OTR/L 11/03/2024, 5:49 PM

## 2024-11-04 ENCOUNTER — Other Ambulatory Visit (HOSPITAL_COMMUNITY): Payer: Self-pay

## 2024-11-04 DIAGNOSIS — E162 Hypoglycemia, unspecified: Secondary | ICD-10-CM | POA: Diagnosis not present

## 2024-11-04 LAB — CBC
HCT: 30.4 % — ABNORMAL LOW (ref 36.0–46.0)
Hemoglobin: 10.1 g/dL — ABNORMAL LOW (ref 12.0–15.0)
MCH: 30 pg (ref 26.0–34.0)
MCHC: 33.2 g/dL (ref 30.0–36.0)
MCV: 90.2 fL (ref 80.0–100.0)
Platelets: 211 K/uL (ref 150–400)
RBC: 3.37 MIL/uL — ABNORMAL LOW (ref 3.87–5.11)
RDW: 15.9 % — ABNORMAL HIGH (ref 11.5–15.5)
WBC: 9.1 K/uL (ref 4.0–10.5)
nRBC: 0 % (ref 0.0–0.2)

## 2024-11-04 LAB — BASIC METABOLIC PANEL WITH GFR
Anion gap: 8 (ref 5–15)
BUN: 28 mg/dL — ABNORMAL HIGH (ref 8–23)
CO2: 26 mmol/L (ref 22–32)
Calcium: 9.5 mg/dL (ref 8.9–10.3)
Chloride: 97 mmol/L — ABNORMAL LOW (ref 98–111)
Creatinine, Ser: 1.12 mg/dL — ABNORMAL HIGH (ref 0.44–1.00)
GFR, Estimated: 50 mL/min — ABNORMAL LOW (ref >60)
Glucose, Bld: 137 mg/dL — ABNORMAL HIGH (ref 70–99)
Potassium: 3.8 mmol/L (ref 3.5–5.1)
Sodium: 131 mmol/L — ABNORMAL LOW (ref 135–145)

## 2024-11-04 LAB — PRO BRAIN NATRIURETIC PEPTIDE: Pro Brain Natriuretic Peptide: 921 pg/mL — ABNORMAL HIGH (ref <300.0)

## 2024-11-04 LAB — MAGNESIUM: Magnesium: 2 mg/dL (ref 1.7–2.4)

## 2024-11-04 LAB — PHOSPHORUS: Phosphorus: 3.1 mg/dL (ref 2.5–4.6)

## 2024-11-04 MED ORDER — FUROSEMIDE 40 MG PO TABS
40.0000 mg | ORAL_TABLET | Freq: Two times a day (BID) | ORAL | Status: DC
  Administered 2024-11-04: 40 mg via ORAL
  Filled 2024-11-04: qty 1

## 2024-11-04 MED ORDER — LANCETS MISC
1.0000 | 0 refills | Status: DC
  Filled 2024-11-04: qty 100, 25d supply, fill #0

## 2024-11-04 MED ORDER — PEN NEEDLES 31G X 5 MM MISC
1.0000 | 0 refills | Status: DC
  Filled 2024-11-04: qty 100, 25d supply, fill #0

## 2024-11-04 MED ORDER — BLOOD GLUCOSE TEST VI STRP
1.0000 | ORAL_STRIP | 0 refills | Status: DC
  Filled 2024-11-04: qty 100, 25d supply, fill #0

## 2024-11-04 MED ORDER — BLOOD GLUCOSE MONITORING SUPPL DEVI
1.0000 | 0 refills | Status: DC
  Filled 2024-11-04: qty 1, fill #0

## 2024-11-04 MED ORDER — LANCET DEVICE MISC
1.0000 | 0 refills | Status: DC
  Filled 2024-11-04: qty 1, fill #0

## 2024-11-04 MED ORDER — FREESTYLE LIBRE 3 PLUS SENSOR MISC
3 refills | Status: AC
  Filled 2024-11-04: qty 2, 30d supply, fill #0

## 2024-11-04 NOTE — Progress Notes (Signed)
 I attest to student documentation.  Brek Reece V. Tashaun Obey, MSN-RN Nursing Faculty/Clinical Instructor [facility] Associate Degree Nursing Program

## 2024-11-04 NOTE — TOC Transition Note (Signed)
 Transition of Care [facility]) - Discharge Note   Patient Details  Name: Deborah Bryant MRN: [id number] Date of Birth: [birth year]  Transition of Care [facility]) CM/SW Contact:  Sonda Manuella Quill, RN Phone Number: 11/04/2024, 11:15 AM   Clinical Narrative:    Orders received for HHPT/OT; spoke w/ pt and family in room; pt declined having this RN CM arrange [facility] services; she was advised to contact her PCP to set up these services if desired; also pt has Inogen concentrator in room; no IP CM needs.   Final next level of care: Home/Self Care Barriers to Discharge: No Barriers Identified   Patient Goals and CMS Choice Patient states their goals for this hospitalization and ongoing recovery are:: Home CMS Medicare.gov Compare Post Acute Care list provided to:: Patient Choice offered to / list presented to : Patient [place] ownership interest in [facility].provided to:: Patient    Discharge Placement                       Discharge Plan and Services Additional resources added to the After Visit Summary for   In-house Referral: NA Discharge Planning Services: CM Consult Post Acute Care Choice: Durable Medical Equipment          DME Arranged: N/A DME Agency: NA       HH Arranged: NA HH Agency: NA        Social Drivers of Health (SDOH) Interventions SDOH Screenings   Food Insecurity: No Food Insecurity (10/31/2024)  Housing: Low Risk  (10/31/2024)  Transportation Needs: No Transportation Needs (10/31/2024)  Utilities: Not At Risk (10/31/2024)  Depression (PHQ2-9): Medium Risk (03/15/2024)  Social Connections: Moderately Isolated (10/31/2024)  Tobacco Use: High Risk (10/31/2024)     Readmission Risk Interventions    11/02/2024    3:40 PM  Readmission Risk Prevention Plan  Transportation Screening Complete  PCP or Specialist Appt within 5-7 Days Complete  Home Care Screening Complete  Medication Review (RN CM) Complete

## 2024-11-04 NOTE — Discharge Summary (Signed)
 Physician Discharge Summary  Deborah Bryant FMW:[id number] DOB: 1946-05-22 DOA: 10/31/2024  PCP: Johnny Garnette LABOR, MD  Admit date: 10/31/2024 Discharge date: 11/04/2024  Admitted From: Home Disposition: Home  Recommendations for Outpatient Follow-up:  Follow up with PCP in 1-2 weeks Please obtain BMP/CBC in one week your next doctors visit.  Discontinue diabetic medications including glipizide . Continuous glucose monitoring has been given.   Discharge Condition: Stable CODE STATUS: Full Diet recommendation: Diabetic  Brief/Interim Summary: Brief Narrative:   78 year old with past medical history significant for anxiety, history of MRSA periorbital cellulitis, chronic kidney disease, COPD, diabetes type 2, GERD, hyperlipidemia, hypertension, IBS who presented by EMS after patient was found unresponsive by husband.  Husband started CPR on EMS arrival as  patient was hypoglycemic.  Patient was taking glipizide , and was having poor oral intake.  Upon admission patient was found to be hypothermic and hypoglycemic.  Initially had altered mental status which resolved with improvement in blood glucose.    Evaluation in the ED found to have normal electrolytes, glucose 111, BUN 40, creatinine 1.3, coronavirus influenza and RSV negative.  Portable chest x-ray no acute cardiopulmonary abnormality.  She was found to be hypothermic with a temperature of 91 and she was placed on Bair hugger and admitted for further care.  Eventually after discussing with diabetic or later she was taken off any diabetic medications.  Deborah Bryant was given.  She significantly well and wishing to go home today.  Assessment & Plan:   Hypoglycemia and hypothermia, resolved Acute metabolic encephalopathy, resolved -No obvious evidence of infection noted.  Patient is on glipizide  at home.  For now this has been discontinued.  Slightly elevated TSH but normal free T4. Normal Cortisol.  Cultures remain negative.  After  discussing with diabetic coordinator, no further antidiabetic medications for home for now.  Deborah Bryant has been given -A1c 2 months ago 6.7    Acute on Chronic Hypoxic Respiratory failure.  Chronic obstructive pulmonary disease: She is back down to 2 L nasal cannula which is her chronic regimen.  Okay to discharge home today    Hypertension Congestive heart failure with diastolic CHF - Zoom home regimen and follow-up outpatient cardiology -Echo January 2025 showing preserved EF with grade 1 DD   Bradycardia: -Asymptomatic. Chronic.  Exacerbated by hypothermia.     Gout: Continue Allopurinol .   Hyperlipidemia: Continue atorvastatin .   Normocytic anemia: Monitor hemoglobin.   Chronic pain: Continue pain management.   Elevated alkaline phosphatase: Chronic previous level 166- follow up out patient  -Down to 176.   AKI on CKD stage II Baseline creatinine 1.0, today 1.2     Estimated body mass index is 33.02 kg/m as calculated from the following:   Height as of this encounter: 5' 2 (1.575 m).   Weight as of this encounter: 81.9 kg.    DVT prophylaxis: enoxaparin  (LOVENOX ) injection 40 mg Start: 10/31/24 1700      Code Status: Full Code Family Communication:   Status is: Inpatient Remains inpatient appropriate because: Discharge   PT Follow up Recs: Home Health Pt11/19/2025 1351  Subjective: Seen at bedside wishing to go home.  Family also present   Examination:  General exam: Appears calm and comfortable, 3L nasal cannula Respiratory system: Clear to auscultation. Respiratory effort normal. Cardiovascular system: S1 & S2 heard, RRR. No JVD, murmurs, rubs, gallops or clicks. No pedal edema. Gastrointestinal system: Abdomen is nondistended, soft and nontender. No organomegaly or masses felt. Normal bowel sounds heard. Central nervous system: Alert  and oriented. No focal neurological deficits. Extremities: Symmetric 5 x 5 power. Skin: No rashes, lesions or  ulcers Psychiatry: Judgement and insight appear normal. Mood & affect appropriate.    Discharge Diagnoses:  Principal Problem:   Hypoglycemia Active Problems:   Hypothermia   Essential hypertension   Hyperlipemia   Gout   Normocytic anemia   Chronic pain of both knees   Chronic obstructive pulmonary disease (HCC)   Elevated alkaline phosphatase level   Elevated serum creatinine   Grade I diastolic dysfunction      Discharge Exam: Vitals:   11/04/24 0900 11/04/24 0918  BP: 135/68   Pulse: (!) 55   Resp:    Temp: 98.4 F (36.9 C)   SpO2: 99% 94%   Vitals:   11/03/24 2225 11/04/24 0512 11/04/24 0900 11/04/24 0918  BP: (!) 143/54 (!) 158/56 135/68   Pulse: 66 (!) 53 (!) 55   Resp: 18 15    Temp: 98.3 F (36.8 C) (!) 97.5 F (36.4 C) 98.4 F (36.9 C)   TempSrc: Oral Oral Oral   SpO2: 94% 100% 99% 94%  Weight:      Height:          Discharge Instructions   Allergies as of 11/04/2024       Reactions   Losartan  Shortness Of Breath   Benazepril  Cough   Lasix  [furosemide ] Other (See Comments)   Rash, changes in eyesight. Currently taking this at home per pt.    Levaquin [levofloxacin] Other (See Comments)   Tendon and muscle pain   Phenobarbital Nausea And Vomiting        Medication List     STOP taking these medications    glipiZIDE  5 MG 24 hr tablet Commonly known as: GLUCOTROL  XL       TAKE these medications    albuterol  108 (90 Base) MCG/ACT inhaler Commonly known as: VENTOLIN  HFA Inhale 2 puffs into the lungs every 6 (six) hours as needed for wheezing or shortness of breath.   allopurinol  300 MG tablet Commonly known as: ZYLOPRIM  Take 1 tablet (300 mg total) by mouth daily. What changed: how much to take   amLODipine  5 MG tablet Commonly known as: NORVASC  Take 5 mg by mouth daily.   aspirin  EC 81 MG tablet Take 81 mg by mouth daily.   atorvastatin  40 MG tablet Commonly known as: LIPITOR Take 1 tablet (40 mg total) by mouth  daily.   Blood Glucose Monitoring Suppl Devi 1 each by Does not apply route as directed. Dispense based on patient and insurance preference. Use up to four times daily as directed. (FOR ICD-10 E10.9, E11.9).   BLOOD GLUCOSE TEST STRIPS Strp 1 each by Does not apply route as directed. Dispense based on patient and insurance preference. Use up to four times daily as directed. (FOR ICD-10 E10.9, E11.9).   diazepam  5 MG tablet Commonly known as: VALIUM  Take 1 tablet (5 mg total) by mouth every 8 (eight) hours as needed for muscle spasms. What changed: when to take this   famotidine  20 MG tablet Commonly known as: PEPCID  Take 20 mg by mouth daily.   FreeStyle Libre 3 Plus Sensor Misc Change sensor every 15 days.   HYDROcodone -acetaminophen  5-325 MG tablet Commonly known as: Norco Take 1 tablet by mouth every 6 (six) hours as needed for moderate pain (pain score 4-6). What changed: when to take this   hydroxychloroquine  200 MG tablet Commonly known as: PLAQUENIL  Take 400 mg by mouth daily.  Lancet Device Misc 1 each by Does not apply route as directed. Dispense based on patient and insurance preference. Use up to four times daily as directed. (FOR ICD-10 E10.9, E11.9).   Lancets Misc 1 each by Does not apply route as directed. Dispense based on patient and insurance preference. Use up to four times daily as directed. (FOR ICD-10 E10.9, E11.9).   furosemide  40 MG tablet Commonly known as: LASIX  Take 40 mg by mouth 2 (two) times daily. What changed: Another medication with the same name was changed. Make sure you understand how and when to take each.   Lasix  20 MG tablet Generic drug: furosemide  Take 4 tablets (80 mg total) by mouth daily. Taking 2 tablets daily What changed:  how much to take additional instructions   Pen Needles 31G X 5 MM Misc 1 each by Does not apply route as directed. Dispense based on patient and insurance preference. Use up to four times daily as  directed. (FOR ICD-10 E10.9, E11.9).   polyethylene glycol powder 17 GM/SCOOP powder Commonly known as: GLYCOLAX /MIRALAX  Take 17 g by mouth daily as needed for mild constipation or moderate constipation.   potassium chloride  10 MEQ tablet Commonly known as: Klor-Con  10 Take 2 tablets (20 mEq total) by mouth daily. What changed:  how much to take when to take this   saccharomyces boulardii 250 MG capsule Commonly known as: FLORASTOR Take 250 mg by mouth daily.   Stiolto Respimat  2.5-2.5 MCG/ACT Aers Generic drug: Tiotropium Bromide-Olodaterol Inhale 2 puffs into the lungs daily.   vitamin C  1000 MG tablet Take 1,000 mg by mouth daily.   Vitamin D  50 MCG (2000 UT) Caps Take 2,000 Units by mouth daily. OTC        Follow-up Information     Johnny Garnette LABOR, MD Follow up in 1 week(s).   Specialty: Family Medicine Contact information: [address] [place] [address] [phone]                Allergies  Allergen Reactions   Losartan  Shortness Of Breath   Benazepril  Cough   Lasix  [Furosemide ] Other (See Comments)    Rash, changes in eyesight. Currently taking this at home per pt.    Levaquin [Levofloxacin] Other (See Comments)    Tendon and muscle pain   Phenobarbital Nausea And Vomiting    You were cared for by a hospitalist during your hospital stay. If you have any questions about your discharge medications or the care you received while you were in the hospital after you are discharged, you can call the unit and asked to speak with the hospitalist on call if the hospitalist that took care of you is not available. Once you are discharged, your primary care physician will handle any further medical issues. Please note that no refills for any discharge medications will be authorized once you are discharged, as it is imperative that you return to your primary care physician (or establish a relationship with a primary care physician if you do not have  one) for your aftercare needs so that they can reassess your need for medications and monitor your lab values.  You were cared for by a hospitalist during your hospital stay. If you have any questions about your discharge medications or the care you received while you were in the hospital after you are discharged, you can call the unit and asked to speak with the hospitalist on call if the hospitalist that took care of you is not  available. Once you are discharged, your primary care physician will handle any further medical issues. Please note that NO REFILLS for any discharge medications will be authorized once you are discharged, as it is imperative that you return to your primary care physician (or establish a relationship with a primary care physician if you do not have one) for your aftercare needs so that they can reassess your need for medications and monitor your lab values.  Please request your Prim.MD to go over all Hospital Tests and Procedure/Radiological results at the follow up, please get all Hospital records sent to your Prim MD by signing hospital release before you go home.  Get CBC, CMP, 2 view Chest X ray checked  by Primary MD during your next visit or SNF MD in 5-7 days ( we routinely change or add medications that can affect your baseline labs and fluid status, therefore we recommend that you get the mentioned basic workup next visit with your PCP, your PCP may decide not to get them or add new tests based on their clinical decision)  On your next visit with your primary care physician please Get Medicines reviewed and adjusted.  If you experience worsening of your admission symptoms, develop shortness of breath, life threatening emergency, suicidal or homicidal thoughts you must seek medical attention immediately by calling 911 or calling your MD immediately  if symptoms less severe.  You Must read complete instructions/literature along with all the possible adverse reactions/side  effects for all the Medicines you take and that have been prescribed to you. Take any new Medicines after you have completely understood and accpet all the possible adverse reactions/side effects.   Do not drive, operate heavy machinery, perform activities at heights, swimming or participation in water activities or provide baby sitting services if your were admitted for syncope or siezures until you have seen by Primary MD or a Neurologist and advised to do so again.  Do not drive when taking Pain medications.   Procedures/Studies: DG CHEST PORT 1 VIEW Result Date: 11/02/2024 CLINICAL DATA:  Dyspnea EXAM: PORTABLE CHEST 1 VIEW COMPARISON:  Chest radiograph dated 10/31/2024 FINDINGS: Normal lung volumes. Bibasilar linear opacities. No pleural effusion or pneumothorax. Similar enlarged cardiomediastinal silhouette. No acute osseous abnormality. IMPRESSION: 1. Bibasilar linear opacities, likely atelectasis. 2. Similar cardiomegaly. Electronically Signed   By: Limin  Xu M.D.   On: 11/02/2024 14:02   DG Chest Port 1 View Result Date: 10/31/2024 EXAM: 1 VIEW(S) XRAY OF THE CHEST 10/31/2024 11:46:00 AM COMPARISON: 02/13/2024 CLINICAL HISTORY: Questionable sepsis - evaluate for abnormality. FINDINGS: LUNGS AND PLEURA: Unchanged linear scarring in the left lung base. No focal pulmonary opacity. No pleural effusion. No pneumothorax. HEART AND MEDIASTINUM: No acute abnormality of the cardiac and mediastinal silhouettes. BONES AND SOFT TISSUES: Degenerative changes noted within the thoracic spine. No acute osseous abnormality. IMPRESSION: 1. No acute cardiopulmonary abnormality. Electronically signed by: Waddell Calk MD 10/31/2024 11:51 AM EST RP Workstation: [id number]     The results of significant diagnostics from this hospitalization (including imaging, microbiology, ancillary and laboratory) are listed below for reference.     Microbiology: Recent Results (from the past 240 hours)  Blood Culture  (routine x 2)     Status: None (Preliminary result)   Collection Time: 10/31/24 12:15 PM   Specimen: BLOOD  Result Value Ref Range Status   Specimen Description   Final    BLOOD RIGHT ANTECUBITAL Performed at [facility], [address] [place], [place] [address]  Special Requests   Final    BOTTLES DRAWN AEROBIC AND ANAEROBIC Blood Culture adequate volume Performed at [facility], [address] [place], [place] [address]    Culture   Final    NO GROWTH 4 DAYS Performed at [facility], [address] [place], [place] [address]    Report Status PENDING  Incomplete  Blood Culture (routine x 2)     Status: None (Preliminary result)   Collection Time: 10/31/24 12:16 PM   Specimen: BLOOD  Result Value Ref Range Status   Specimen Description   Final    BLOOD SITE NOT SPECIFIED Performed at [facility], [address] [place], [place] [address]    Special Requests   Final    BOTTLES DRAWN AEROBIC AND ANAEROBIC Blood Culture adequate volume Performed at [facility], [address] [place], [place] [address]    Culture   Final    NO GROWTH 4 DAYS Performed at [facility], [address], [place], [place] [address]    Report Status PENDING  Incomplete  Resp panel by RT-PCR (RSV, Flu A&B, Covid) Anterior Nasal Swab     Status: None   Collection Time: 10/31/24 12:17 PM   Specimen: Anterior Nasal Swab  Result Value Ref Range Status   SARS Coronavirus 2 by RT PCR NEGATIVE NEGATIVE Final    Comment: (NOTE) SARS-CoV-2 target nucleic acids are NOT DETECTED.  The SARS-CoV-2 RNA is generally detectable in upper respiratory specimens during the acute phase of infection. The lowest concentration of SARS-CoV-2 viral copies this assay can detect is 138 copies/mL. A negative result does not preclude SARS-Cov-2 infection and should not be used as the sole basis for treatment or other patient  management decisions. A negative result may occur with  improper specimen collection/handling, submission of specimen other than nasopharyngeal swab, presence of viral mutation(s) within the areas targeted by this assay, and inadequate number of viral copies(<138 copies/mL). A negative result must be combined with clinical observations, patient history, and epidemiological information. The expected result is Negative.  Fact Sheet for Patients:  bloggercourse.com  Fact Sheet for Healthcare Providers:  seriousbroker.it  This test is no t yet approved or cleared by the [place]  FDA and  has been authorized for detection and/or diagnosis of SARS-CoV-2 by FDA under an Emergency Use Authorization (EUA). This EUA will remain  in effect (meaning this test can be used) for the duration of the COVID-19 declaration under Section 564(b)(1) of the Act, 21 U.S.C.section 360bbb-3(b)(1), unless the authorization is terminated  or revoked sooner.       Influenza A by PCR NEGATIVE NEGATIVE Final   Influenza B by PCR NEGATIVE NEGATIVE Final    Comment: (NOTE) The Xpert Xpress SARS-CoV-2/FLU/RSV plus assay is intended as an aid in the diagnosis of influenza from Nasopharyngeal swab specimens and should not be used as a sole basis for treatment. Nasal washings and aspirates are unacceptable for Xpert Xpress SARS-CoV-2/FLU/RSV testing.  Fact Sheet for Patients: bloggercourse.com  Fact Sheet for Healthcare Providers: seriousbroker.it  This test is not yet approved or cleared by the [place]  FDA and has been authorized for detection and/or diagnosis of SARS-CoV-2 by FDA under an Emergency Use Authorization (EUA). This EUA will remain in effect (meaning this test can be used) for the duration of the COVID-19 declaration under Section 564(b)(1) of the Act, 21 U.S.C. section 360bbb-3(b)(1),  unless the authorization is terminated or revoked.  Resp Syncytial Virus by PCR NEGATIVE NEGATIVE Final    Comment: (NOTE) Fact Sheet for Patients: bloggercourse.com  Fact Sheet for Healthcare Providers: seriousbroker.it  This test is not yet approved or cleared by the [place]  FDA and has been authorized for detection and/or diagnosis of SARS-CoV-2 by FDA under an Emergency Use Authorization (EUA). This EUA will remain in effect (meaning this test can be used) for the duration of the COVID-19 declaration under Section 564(b)(1) of the Act, 21 U.S.C. section 360bbb-3(b)(1), unless the authorization is terminated or revoked.  Performed at [facility], [address], [place], [place] [address]      Labs: BNP (last 3 results) Recent Labs    12/28/23 1359  BNP 1,370.4*   Basic Metabolic Panel: Recent Labs  Lab 10/31/24 1219 11/01/24 0304 11/02/24 1042 11/03/24 0306 11/04/24 0032  NA 140 137 130* 132* 131*  K 4.2 4.3 4.4 3.9 3.8  CL 101 100 94* 95* 97*  CO2 29 27 26 27 26   GLUCOSE 111* 106* 124* 114* 137*  BUN 40* 31* 28* 31* 28*  CREATININE 1.31* 1.00 1.14* 1.29* 1.12*  CALCIUM  10.2 9.3 9.1 9.4 9.5  MG  --   --  1.9  --  2.0  PHOS  --   --   --   --  3.1   Liver Function Tests: Recent Labs  Lab 10/31/24 1219 11/01/24 0304  AST 33 31  ALT 19 17  ALKPHOS 211* 176*  BILITOT 0.3 0.2  PROT 7.4 6.3*  ALBUMIN 4.1 3.5   No results for input(s): LIPASE, AMYLASE in the last 168 hours. No results for input(s): AMMONIA in the last 168 hours. CBC: Recent Labs  Lab 10/31/24 1218 11/01/24 0304 11/02/24 1042 11/04/24 0032  WBC 8.2 8.0 8.6 9.1  NEUTROABS 7.3  --   --   --   HGB 11.2* 10.9* 10.2* 10.1*  HCT 35.3* 34.0* 30.9* 30.4*  MCV 91.2 91.9 88.0 90.2  PLT 228 203 226 211   Cardiac Enzymes: No results for input(s): CKTOTAL, CKMB, CKMBINDEX, TROPONINI in the last  168 hours. BNP: Invalid input(s): POCBNP CBG: Recent Labs  Lab 11/02/24 1933 11/02/24 2322 11/03/24 0314 11/03/24 0745 11/03/24 1126  GLUCAP 159* 110* 104* 104* 120*   D-Dimer No results for input(s): DDIMER in the last 72 hours. Hgb A1c No results for input(s): HGBA1C in the last 72 hours. Lipid Profile No results for input(s): CHOL, HDL, LDLCALC, TRIG, CHOLHDL, LDLDIRECT in the last 72 hours. Thyroid  function studies No results for input(s): TSH, T4TOTAL, T3FREE, THYROIDAB in the last 72 hours.  Invalid input(s): FREET3 Anemia work up No results for input(s): VITAMINB12, FOLATE, FERRITIN, TIBC, IRON, RETICCTPCT in the last 72 hours. Urinalysis    Component Value Date/Time   COLORURINE STRAW (A) 10/31/2024 1422   APPEARANCEUR CLEAR 10/31/2024 1422   LABSPEC 1.008 10/31/2024 1422   PHURINE 5.0 10/31/2024 1422   GLUCOSEU NEGATIVE 10/31/2024 1422   GLUCOSEU NEGATIVE 08/27/2021 1045   HGBUR NEGATIVE 10/31/2024 1422   HGBUR large 09/07/2010 1338   BILIRUBINUR NEGATIVE 10/31/2024 1422   BILIRUBINUR n 08/24/2019 1035   KETONESUR NEGATIVE 10/31/2024 1422   PROTEINUR NEGATIVE 10/31/2024 1422   UROBILINOGEN 0.2 08/27/2021 1045   NITRITE NEGATIVE 10/31/2024 1422   LEUKOCYTESUR NEGATIVE 10/31/2024 1422   Sepsis Labs Recent Labs  Lab 10/31/24 1218 11/01/24 0304 11/02/24 1042 11/04/24 0032  WBC 8.2 8.0 8.6 9.1   Microbiology Recent Results (from the past 240 hours)  Blood Culture (routine x 2)     Status: None (Preliminary result)   Collection Time: 10/31/24 12:15 PM   Specimen: BLOOD  Result Value Ref Range Status   Specimen Description   Final    BLOOD RIGHT ANTECUBITAL Performed at [facility], [address], [place], [place] [address]    Special Requests   Final    BOTTLES DRAWN AEROBIC AND ANAEROBIC Blood Culture adequate volume Performed at [facility], [address],  [place], [place] [address]    Culture   Final    NO GROWTH 4 DAYS Performed at [facility], [address] [place], [place] [address]    Report Status PENDING  Incomplete  Blood Culture (routine x 2)     Status: None (Preliminary result)   Collection Time: 10/31/24 12:16 PM   Specimen: BLOOD  Result Value Ref Range Status   Specimen Description   Final    BLOOD SITE NOT SPECIFIED Performed at [facility], [address], [place], [place] [address]    Special Requests   Final    BOTTLES DRAWN AEROBIC AND ANAEROBIC Blood Culture adequate volume Performed at [facility], [address], [place], [place] [address]    Culture   Final    NO GROWTH 4 DAYS Performed at [facility], [address], [place], [place] [address]    Report Status PENDING  Incomplete  Resp panel by RT-PCR (RSV, Flu A&B, Covid) Anterior Nasal Swab     Status: None   Collection Time: 10/31/24 12:17 PM   Specimen: Anterior Nasal Swab  Result Value Ref Range Status   SARS Coronavirus 2 by RT PCR NEGATIVE NEGATIVE Final    Comment: (NOTE) SARS-CoV-2 target nucleic acids are NOT DETECTED.  The SARS-CoV-2 RNA is generally detectable in upper respiratory specimens during the acute phase of infection. The lowest concentration of SARS-CoV-2 viral copies this assay can detect is 138 copies/mL. A negative result does not preclude SARS-Cov-2 infection and should not be used as the sole basis for treatment or other patient management decisions. A negative result may occur with  improper specimen collection/handling, submission of specimen other than nasopharyngeal swab, presence of viral mutation(s) within the areas targeted by this assay, and inadequate number of viral copies(<138 copies/mL). A negative result must be combined with clinical observations, patient history, and epidemiological information. The expected result is Negative.  Fact Sheet for Patients:   bloggercourse.com  Fact Sheet for Healthcare Providers:  seriousbroker.it  This test is no t yet approved or cleared by the [place]  FDA and  has been authorized for detection and/or diagnosis of SARS-CoV-2 by FDA under an Emergency Use Authorization (EUA). This EUA will remain  in effect (meaning this test can be used) for the duration of the COVID-19 declaration under Section 564(b)(1) of the Act, 21 U.S.C.section 360bbb-3(b)(1), unless the authorization is terminated  or revoked sooner.       Influenza A by PCR NEGATIVE NEGATIVE Final   Influenza B by PCR NEGATIVE NEGATIVE Final    Comment: (NOTE) The Xpert Xpress SARS-CoV-2/FLU/RSV plus assay is intended as an aid in the diagnosis of influenza from Nasopharyngeal swab specimens and should not be used as a sole basis for treatment. Nasal washings and aspirates are unacceptable for Xpert Xpress SARS-CoV-2/FLU/RSV testing.  Fact Sheet for Patients: bloggercourse.com  Fact Sheet for Healthcare Providers: seriousbroker.it  This test is not yet approved or cleared by  the [place]  FDA and has been authorized for detection and/or diagnosis of SARS-CoV-2 by FDA under an Emergency Use Authorization (EUA). This EUA will remain in effect (meaning this test can be used) for the duration of the COVID-19 declaration under Section 564(b)(1) of the Act, 21 U.S.C. section 360bbb-3(b)(1), unless the authorization is terminated or revoked.     Resp Syncytial Virus by PCR NEGATIVE NEGATIVE Final    Comment: (NOTE) Fact Sheet for Patients: bloggercourse.com  Fact Sheet for Healthcare Providers: seriousbroker.it  This test is not yet approved or cleared by the [place]  FDA and has been authorized for detection and/or diagnosis of SARS-CoV-2 by FDA under an Emergency Use  Authorization (EUA). This EUA will remain in effect (meaning this test can be used) for the duration of the COVID-19 declaration under Section 564(b)(1) of the Act, 21 U.S.C. section 360bbb-3(b)(1), unless the authorization is terminated or revoked.  Performed at [facility], [address] [place], [place] [address]      Time coordinating discharge:  I have spent 35 minutes face to face with the patient and on the ward discussing the patients care, assessment, plan and disposition with other care givers. >50% of the time was devoted counseling the patient about the risks and benefits of treatment/Discharge disposition and coordinating care.   SIGNED:   Burgess JAYSON Dare, MD  [facility] 11/04/2024, 11:03 AM   If 7PM-7AM, please contact night-coverage

## 2024-11-04 NOTE — Inpatient Diabetes Management (Signed)
 Inpatient Diabetes Program Recommendations  AACE/ADA: New Consensus Statement on Inpatient Glycemic Control (2015)  Target Ranges:  Prepandial:   less than 140 mg/dL      Peak postprandial:   less than 180 mg/dL (1-2 hours)      Critically ill patients:  140 - 180 mg/dL   Lab Results  Component Value Date   GLUCAP 120 (H) 11/03/2024   HGBA1C 6.7 (H) 08/19/2024    Met with Deborah Bryant and family at bedside this morning. Helped them connect family members to [employer].  Provided her with an extra sample.    Discharge Recommendations: Other recommendations: For discharge:  Please discontinue Glipizide  5 mg.   Follow up with PCP in 1-2 weeks Needs a Glucometer and supplies.  Please order [name] 3 Order # [id number] Supply/Referral recommendations: Glucometer Test strips Lancet device Lancets   Use Adult Diabetes Insulin  Treatment Post Discharge order set.  Thank you, [name], MSN, CDCES Diabetes Coordinator Inpatient Diabetes Program [phone] (team pager from 8a-5p)

## 2024-11-05 ENCOUNTER — Telehealth: Payer: Self-pay

## 2024-11-05 LAB — CULTURE, BLOOD (ROUTINE X 2)
Culture: NO GROWTH
Culture: NO GROWTH
Special Requests: ADEQUATE
Special Requests: ADEQUATE

## 2024-11-05 NOTE — Transitions of Care (Post Inpatient/ED Visit) (Signed)
 11/05/2024  Name: Deborah Bryant MRN: [id number] DOB: 1946/11/14  Today's TOC FU Call Status: Today's TOC FU Call Status:: Successful TOC FU Call Completed TOC FU Call Complete Date: 11/05/24  Patient's Name and Date of Birth confirmed. Name, DOB  Transition Care Management Follow-up Telephone Call Date of Discharge: 11/04/24 Discharge Facility: [facility]) Type of Discharge: Inpatient Admission Primary Inpatient Discharge Diagnosis:: Hypoglycemia How have you been since you were released from the hospital?: Same Any questions or concerns?: No  Items Reviewed: Did you receive and understand the discharge instructions provided?: Yes Medications obtained,verified, and reconciled?: Yes (Medications Reviewed) Any new allergies since your discharge?: No Dietary orders reviewed?: NA (on a regular diet) Do you have support at home?: Yes People in Home [RPT]: spouse Name of Support/Comfort Primary Source: husband, nephew  Medications Reviewed Today: Medications Reviewed Today     Reviewed by Rumalda Alan PENNER, RN (Registered Nurse) on 11/05/24 at 1001  Med List Status: <None>   Medication Order Taking? Sig Documenting Provider Last Dose Status Informant  albuterol  (VENTOLIN  HFA) 108 (90 Base) MCG/ACT inhaler [id number] Yes Inhale 2 puffs into the lungs every 6 (six) hours as needed for wheezing or shortness of breath. Kara Dorn NOVAK, MD  Active Self, Pharmacy Records  allopurinol  (ZYLOPRIM ) 300 MG tablet [id number] Yes Take 1 tablet (300 mg total) by mouth daily.  Patient taking differently: Take 150 mg by mouth daily.   Johnny Garnette LABOR, MD  Active Self, Pharmacy Records           Med Note (CRUTHIS, SHEFFIELD JAYSON Repress Oct 31, 2024  1:56 PM) Pt is adamant this is how she is taking this medication.   amLODipine  (NORVASC ) 5 MG tablet [id number] Yes Take 5 mg by mouth daily. [provider]  Active Self, Pharmacy Records  Ascorbic Acid  (VITAMIN C ) 1000 MG tablet [id number]  Take  1,000 mg by mouth daily.  Patient not taking: Reported on 11/05/2024   [provider]  Active Self, Pharmacy Records  aspirin  EC 81 MG tablet [id number] Yes Take 81 mg by mouth daily. [provider]  Active Self, Pharmacy Records  atorvastatin  (LIPITOR) 40 MG tablet [id number] Yes Take 1 tablet (40 mg total) by mouth daily. Johnny Garnette LABOR, MD  Active Self, Pharmacy Records  Blood Glucose Monitoring Sidney FRENCH [id number] Yes Dispense based on patient and insurance preference. Use up to four times daily as directed. (FOR ICD-10 E10.9, E11.9). Caleen Burgess JAYSON, MD  Active   Cholecalciferol  (VITAMIN D ) 2000 units CAPS [id number] Yes Take 2,000 Units by mouth daily. OTC [provider]  Active Self, Pharmacy Records  Continuous Glucose Sensor (FREESTYLE LIBRE 3 PLUS SENSOR) MISC [id number] Yes Change sensor every 15 days. Caleen Burgess JAYSON, MD  Active   diazepam  (VALIUM ) 5 MG tablet [id number] Yes Take 1 tablet (5 mg total) by mouth every 8 (eight) hours as needed for muscle spasms. Johnny Garnette LABOR, MD  Active Self, Pharmacy Records  famotidine  (PEPCID ) 20 MG tablet [id number] Yes Take 20 mg by mouth daily. [provider]  Active Self, Pharmacy Records  furosemide  (LASIX ) 40 MG tablet [id number] Yes Take 40 mg by mouth 2 (two) times daily. [provider]  Active Self, Pharmacy Records           Med Note (CRUTHIS, SHEFFIELD JAYSON Repress Oct 31, 2024  1:57 PM) Pt she takes both the 40 mg and 20 mg tablets daily.   Glucose Blood (BLOOD  GLUCOSE TEST STRIPS) STRP [id number] Yes Use up to four times daily as directed. Caleen Burgess BROCKS, MD  Active   HYDROcodone -acetaminophen  (NORCO) 5-325 MG tablet [id number] Yes Take 1 tablet by mouth every 6 (six) hours as needed for moderate pain (pain score 4-6). Johnny Garnette LABOR, MD  Active Self, Pharmacy Records  hydroxychloroquine  (PLAQUENIL ) 200 MG tablet [id number] Yes Take 400 mg by mouth daily. [provider]  Active Self, Pharmacy Records   Insulin  Pen Needle (PEN NEEDLES) 31G X 5 MM MISC [id number]  Use up to four times daily as directed.  Patient not taking: Reported on 11/05/2024   Caleen Burgess BROCKS, MD  Active   Lancet Device MISC [id number] Yes Dispense based on patient and insurance preference. Use up to four times daily as directed. (FOR ICD-10 E10.9, E11.9). Caleen Burgess BROCKS, MD  Active   Lancets MISC [id number] Yes Use up to four times daily as directed. Caleen Burgess BROCKS, MD  Active   LASIX  20 MG tablet [id number]  Take 4 tablets (80 mg total) by mouth daily. Taking 2 tablets daily  Patient not taking: Reported on 11/05/2024   Raenelle Donalda HERO, MD  Active Self, Pharmacy Records           Med Note (CRUTHIS, SHEFFIELD BROCKS Repress Oct 31, 2024  1:57 PM) Pt she takes both the 40 mg and 20 mg tablets daily.   polyethylene glycol powder (GLYCOLAX /MIRALAX ) powder [id number] Yes Take 17 g by mouth daily as needed for mild constipation or moderate constipation. [provider]  Active Self, Pharmacy Records           Med Note (CRUTHIS, SHEFFIELD BROCKS Repress Oct 31, 2024  1:30 PM)    potassium chloride  (KLOR-CON  10) 10 MEQ tablet [id number] Yes Take 2 tablets (20 mEq total) by mouth daily. Raenelle Donalda HERO, MD  Active Self, Pharmacy Records           Med Note (CRUTHIS, SHEFFIELD BROCKS Repress Oct 31, 2024  1:58 PM) Pt is adamant she is still taking this medication BID. Dispense report does not support this claim.   saccharomyces boulardii (FLORASTOR) 250 MG capsule [id number] Yes Take 250 mg by mouth daily. [provider]  Active Self, Pharmacy Records  Tiotropium Bromide-Olodaterol (STIOLTO RESPIMAT ) 2.5-2.5 MCG/ACT AERS [id number] Yes Inhale 2 puffs into the lungs daily. Kara Dorn NOVAK, MD  Active Self, Pharmacy Records           Med Note (CRUTHIS, SHEFFIELD BROCKS Repress Oct 31, 2024  2:00 PM) Pt is adamant she is still using this medication. Dispense report does not this claim.             Home Care and Equipment/Supplies: Were Home Health Services  Ordered?: No Any new equipment or medical supplies ordered?: No  Functional Questionnaire: Do you need assistance with bathing/showering or dressing?: No Do you need assistance with meal preparation?: No Do you need assistance with eating?: No Do you have difficulty maintaining continence: No Do you need assistance with getting out of bed/getting out of a chair/moving?: No Do you have difficulty managing or taking your medications?: No  Follow up appointments reviewed: PCP Follow-up appointment confirmed?: No (will call today and schedule.) MD Provider Line Number:[phone] Given: No Specialist Hospital Follow-up appointment confirmed?: No Reason Specialist Follow-Up Not Confirmed: Patient has Specialist Provider Number and will Call for Appointment Do you need transportation to your follow-up appointment?: No Do you understand care  options if your condition(s) worsen?: Yes-patient verbalized understanding  SDOH Interventions Today    Flowsheet Row Most Recent Value  SDOH Interventions   Food Insecurity Interventions Intervention Not Indicated  Housing Interventions Intervention Not Indicated  Transportation Interventions Intervention Not Indicated  Utilities Interventions Intervention Not Indicated   Placed call to patient today and reviewed reason for call.  Patient reports that her continuous blood glucose monitor went off 4 times last night with readings less than 69.   She reports that she was able to get something to eat and drink. Reports CBG this morning is better after breakfast. States that her sister is a engineer, civil (consulting) and is helping her with good protein choices.  Reviewed with patient to keep log of CBG to show trend. Reviewed importance of having proteins to stabilize her blood sugar.  Reviewed importance of having foods at her bedside so she does not have to get up if her blood sugar drops in the middle of the night.  ( This would be a fall risk) . Reviewed all medications.   Noted that patient in a hurry to get off the phone.  Reviewed importance of timely follow up with PCP in 1 week. Patient reports that she will handle this herself.   Reviewed and offered 30 day TOC program and patient declined.  Provided my contact information for patient to call back if needed.   Alan Ee, RN, BSN, CEN [employer]- Transition of Care Team.  [facility] [phone]

## 2024-11-09 ENCOUNTER — Ambulatory Visit (INDEPENDENT_AMBULATORY_CARE_PROVIDER_SITE_OTHER): Admitting: Family Medicine

## 2024-11-09 ENCOUNTER — Encounter: Payer: Self-pay | Admitting: Family Medicine

## 2024-11-09 VITALS — BP 140/60 | HR 51 | Temp 95.9°F | Wt 174.2 lb

## 2024-11-09 DIAGNOSIS — E119 Type 2 diabetes mellitus without complications: Secondary | ICD-10-CM

## 2024-11-09 DIAGNOSIS — E162 Hypoglycemia, unspecified: Secondary | ICD-10-CM

## 2024-11-09 DIAGNOSIS — N170 Acute kidney failure with tubular necrosis: Secondary | ICD-10-CM

## 2024-11-09 DIAGNOSIS — I10 Essential (primary) hypertension: Secondary | ICD-10-CM

## 2024-11-09 DIAGNOSIS — G9340 Encephalopathy, unspecified: Secondary | ICD-10-CM

## 2024-11-09 NOTE — Progress Notes (Signed)
   Subjective:    Patient ID: Deborah Bryant, female    DOB: [birth year], 78 y.o.   MRN: [id number]  HPI Here with her sister-in-law to follow up a hospital stay from 10-31-24 to 11-04-24 for hypoglycemia, hypothermia ,and metabolic encephalopathy. On the day of admission her husband found her unresponsive on the floor. He called EMS and started CPR. When they arrived her glucose was 33, so they administered glucose and glucagon . She regained consciousness quickly, and they took her to the ED. Other being hypothermic her exam was unremarkable. She was taken off Glipizide , and her glucoses were monitored closely. She is wearing a CGM and it notifies her if the glucose gets below 70. In the hospital her creatinine on admission was 1.31 and GFR was 42. By DC the creatinine was down to 1.12 and the GFR was up to 50. Her Hgb was low at 10.1. All other labs were normal. A CXR was clear. Of note her oral intake had dropped quite a bit in the last few months because her loss of taste and smell has decreased her appetite. Since going home she is eating 3 meals a day, and she adds 1-2 protein shakes a day. Her glucoses have ranged from 69 to 150.    Review of Systems  Constitutional:  Positive for fatigue. Negative for fever.  Respiratory:  Positive for shortness of breath.   Cardiovascular: Negative.   Gastrointestinal: Negative.   Genitourinary: Negative.   Neurological: Negative.        Objective:   Physical Exam Constitutional:      Appearance: She is not ill-appearing.     Comments: Wearing her [place] oxygen    Cardiovascular:     Rate and Rhythm: Normal rate and regular rhythm.     Pulses: Normal pulses.     Heart sounds: Normal heart sounds.  Pulmonary:     Effort: Pulmonary effort is normal.     Breath sounds: Normal breath sounds.  Musculoskeletal:     Right lower leg: No edema.     Left lower leg: No edema.  Neurological:     Mental Status: She is alert and oriented to person, place, and  time. Mental status is at baseline.           Assessment & Plan:  She has suddenly developed a tendency to hypoglycemia. She is off all diabetic medications. She is trying to increase her oral intake. We will refer her to Endocrinology and to Nutrition to help manage this. Her encephalopathy has resolved. Her HTN is stable. Her CKD is back to baseline. We will check a BMET and CBC today. Garnette Olmsted, MD

## 2024-11-10 ENCOUNTER — Ambulatory Visit: Payer: Self-pay | Admitting: Family Medicine

## 2024-11-10 LAB — CBC WITH DIFFERENTIAL/PLATELET
Basophils Absolute: 0.1 K/uL (ref 0.0–0.1)
Basophils Relative: 0.7 % (ref 0.0–3.0)
Eosinophils Absolute: 0.1 K/uL (ref 0.0–0.7)
Eosinophils Relative: 1.1 % (ref 0.0–5.0)
HCT: 35.3 % — ABNORMAL LOW (ref 36.0–46.0)
Hemoglobin: 11.5 g/dL — ABNORMAL LOW (ref 12.0–15.0)
Lymphocytes Relative: 11.3 % — ABNORMAL LOW (ref 12.0–46.0)
Lymphs Abs: 1.1 K/uL (ref 0.7–4.0)
MCHC: 32.7 g/dL (ref 30.0–36.0)
MCV: 90.5 fl (ref 78.0–100.0)
Monocytes Absolute: 0.7 K/uL (ref 0.1–1.0)
Monocytes Relative: 7 % (ref 3.0–12.0)
Neutro Abs: 7.6 K/uL (ref 1.4–7.7)
Neutrophils Relative %: 79.9 % — ABNORMAL HIGH (ref 43.0–77.0)
Platelets: 358 K/uL (ref 150.0–400.0)
RBC: 3.9 Mil/uL (ref 3.87–5.11)
RDW: 17.8 % — ABNORMAL HIGH (ref 11.5–15.5)
WBC: 9.6 K/uL (ref 4.0–10.5)

## 2024-11-10 LAB — BASIC METABOLIC PANEL WITH GFR
BUN: 43 mg/dL — ABNORMAL HIGH (ref 6–23)
CO2: 28 meq/L (ref 19–32)
Calcium: 10 mg/dL (ref 8.4–10.5)
Chloride: 100 meq/L (ref 96–112)
Creatinine, Ser: 1.18 mg/dL (ref 0.40–1.20)
GFR: 44.33 mL/min — ABNORMAL LOW (ref >60.00)
Glucose, Bld: 110 mg/dL — ABNORMAL HIGH (ref 70–99)
Potassium: 3.7 meq/L (ref 3.5–5.1)
Sodium: 140 meq/L (ref 135–145)

## 2024-11-16 ENCOUNTER — Telehealth: Payer: Self-pay | Admitting: Family Medicine

## 2024-11-16 NOTE — Telephone Encounter (Unsigned)
 Copied from CRM #[id number]. Topic: Medical Record Request - Provider/Facility Request >> Nov 16, 2024  8:19 AM Robinson H wrote: Reason for CRM: [facility] states they received referral for patient but needs a demographic page sent.  [facility] [phone] Ext [phone] fax

## 2024-11-17 NOTE — Telephone Encounter (Signed)
 Pt demographics faxed to [facility] as requested, left detailed message on [place] ext regarding this message

## 2024-11-18 ENCOUNTER — Ambulatory Visit (INDEPENDENT_AMBULATORY_CARE_PROVIDER_SITE_OTHER): Admitting: Family Medicine

## 2024-11-18 ENCOUNTER — Encounter: Payer: Self-pay | Admitting: Family Medicine

## 2024-11-18 VITALS — BP 110/60 | HR 52 | Temp 97.8°F | Wt 171.4 lb

## 2024-11-18 DIAGNOSIS — M545 Low back pain, unspecified: Secondary | ICD-10-CM

## 2024-11-18 DIAGNOSIS — G8929 Other chronic pain: Secondary | ICD-10-CM | POA: Diagnosis not present

## 2024-11-18 MED ORDER — HYDROCODONE-ACETAMINOPHEN 5-325 MG PO TABS: 1.0000 | ORAL_TABLET | Freq: Four times a day (QID) | ORAL | 0 refills | Status: AC | PRN

## 2024-11-18 MED ORDER — HYDROCODONE-ACETAMINOPHEN 5-325 MG PO TABS: 1.0000 | ORAL_TABLET | Freq: Four times a day (QID) | ORAL | 0 refills | Status: DC | PRN

## 2024-11-18 NOTE — Progress Notes (Signed)
   Subjective:    Patient ID: Deborah Bryant, female    DOB: 1946/04/04, 78 y.o.   MRN: [id number]  HPI Here for pain management. She is doing well.    Review of Systems  Constitutional: Negative.   Musculoskeletal:  Positive for arthralgias and back pain.       Objective:   Physical Exam Constitutional:      Appearance: Normal appearance.  Neurological:     Mental Status: She is alert.           Assessment & Plan:  Pain management. Indication for chronic opioid: low back pain Medication and dose: Norco 5-325 # pills per month: 120 Last UDS date: 08-19-24 Opioid Treatment Agreement signed (Y/N): 03-19-18 Opioid Treatment Agreement last reviewed with patient:  11-18-24 [facility] reviewed this encounter (include red flags): Yes Meds were refilled.  Garnette Olmsted, MD

## 2024-11-25 ENCOUNTER — Encounter: Payer: Self-pay | Admitting: Cardiology

## 2024-12-22 ENCOUNTER — Telehealth: Payer: Self-pay | Admitting: *Deleted

## 2024-12-22 NOTE — Telephone Encounter (Signed)
 Copied from CRM [id number]. Topic: Clinical - Prescription Issue >> Dec 21, 2024  5:33 PM Delon DASEN wrote: Reason for CRM: fax sent regarding medical necessity was received but  not clear, need it resent - [phone]

## 2024-12-27 NOTE — Telephone Encounter (Signed)
 Spoke with [employer] regarding this encounter, agent stated that pt chart show a request for ORO order that was voided. Agent advised not to worry about message since nothing further is needed.

## 2024-12-30 LAB — LAB REPORT - SCANNED: EGFR: 40

## 2025-01-12 ENCOUNTER — Other Ambulatory Visit: Payer: Self-pay | Admitting: Cardiology

## 2025-01-14 ENCOUNTER — Encounter: Payer: Self-pay | Admitting: Cardiology

## 2025-01-17 MED ORDER — AMLODIPINE BESYLATE 5 MG PO TABS: 5.0000 mg | ORAL_TABLET | Freq: Every day | ORAL | 2 refills | Status: AC

## 2025-01-19 ENCOUNTER — Inpatient Hospital Stay (HOSPITAL_COMMUNITY)

## 2025-01-19 ENCOUNTER — Inpatient Hospital Stay (HOSPITAL_COMMUNITY)
Admission: EM | Admit: 2025-01-19 | Source: Home / Self Care | Attending: Internal Medicine | Admitting: Internal Medicine

## 2025-01-19 ENCOUNTER — Emergency Department (HOSPITAL_COMMUNITY)

## 2025-01-19 ENCOUNTER — Other Ambulatory Visit: Payer: Self-pay

## 2025-01-19 ENCOUNTER — Telehealth: Payer: Self-pay | Admitting: *Deleted

## 2025-01-19 DIAGNOSIS — N1831 Chronic kidney disease, stage 3a: Secondary | ICD-10-CM

## 2025-01-19 DIAGNOSIS — I5031 Acute diastolic (congestive) heart failure: Secondary | ICD-10-CM

## 2025-01-19 DIAGNOSIS — E039 Hypothyroidism, unspecified: Secondary | ICD-10-CM | POA: Diagnosis not present

## 2025-01-19 DIAGNOSIS — G9340 Encephalopathy, unspecified: Secondary | ICD-10-CM

## 2025-01-19 DIAGNOSIS — S065XAA Traumatic subdural hemorrhage with loss of consciousness status unknown, initial encounter: Secondary | ICD-10-CM | POA: Diagnosis present

## 2025-01-19 DIAGNOSIS — R001 Bradycardia, unspecified: Secondary | ICD-10-CM

## 2025-01-19 DIAGNOSIS — D696 Thrombocytopenia, unspecified: Secondary | ICD-10-CM | POA: Diagnosis not present

## 2025-01-19 DIAGNOSIS — T68XXXA Hypothermia, initial encounter: Secondary | ICD-10-CM | POA: Diagnosis not present

## 2025-01-19 DIAGNOSIS — I6200 Nontraumatic subdural hemorrhage, unspecified: Principal | ICD-10-CM

## 2025-01-19 DIAGNOSIS — E871 Hypo-osmolality and hyponatremia: Secondary | ICD-10-CM

## 2025-01-19 DIAGNOSIS — J9601 Acute respiratory failure with hypoxia: Secondary | ICD-10-CM

## 2025-01-19 LAB — GLUCOSE, CAPILLARY
Glucose-Capillary: 140 mg/dL — ABNORMAL HIGH (ref 70–99)
Glucose-Capillary: 147 mg/dL — ABNORMAL HIGH (ref 70–99)
Glucose-Capillary: 161 mg/dL — ABNORMAL HIGH (ref 70–99)
Glucose-Capillary: 52 mg/dL — ABNORMAL LOW (ref 70–99)
Glucose-Capillary: 55 mg/dL — ABNORMAL LOW (ref 70–99)
Glucose-Capillary: 71 mg/dL (ref 70–99)
Glucose-Capillary: 92 mg/dL (ref 70–99)
Glucose-Capillary: 95 mg/dL (ref 70–99)

## 2025-01-19 LAB — URINALYSIS, ROUTINE W REFLEX MICROSCOPIC
Bilirubin Urine: NEGATIVE
Glucose, UA: NEGATIVE mg/dL
Hgb urine dipstick: NEGATIVE
Ketones, ur: NEGATIVE mg/dL
Leukocytes,Ua: NEGATIVE
Nitrite: NEGATIVE
Protein, ur: NEGATIVE mg/dL
Specific Gravity, Urine: 1.009 (ref 1.005–1.030)
pH: 6 (ref 5.0–8.0)

## 2025-01-19 LAB — CBC WITH DIFFERENTIAL/PLATELET
Abs Immature Granulocytes: 0.07 10*3/uL (ref 0.00–0.07)
Basophils Absolute: 0.1 10*3/uL (ref 0.0–0.1)
Basophils Relative: 1 %
Eosinophils Absolute: 0 10*3/uL (ref 0.0–0.5)
Eosinophils Relative: 0 %
HCT: 30.2 % — ABNORMAL LOW (ref 36.0–46.0)
Hemoglobin: 10.1 g/dL — ABNORMAL LOW (ref 12.0–15.0)
Immature Granulocytes: 1 %
Lymphocytes Relative: 7 %
Lymphs Abs: 0.7 10*3/uL (ref 0.7–4.0)
MCH: 30.2 pg (ref 26.0–34.0)
MCHC: 33.4 g/dL (ref 30.0–36.0)
MCV: 90.4 fL (ref 80.0–100.0)
Monocytes Absolute: 0.6 10*3/uL (ref 0.1–1.0)
Monocytes Relative: 7 %
Neutro Abs: 8.1 10*3/uL — ABNORMAL HIGH (ref 1.7–7.7)
Neutrophils Relative %: 84 %
Platelets: 127 10*3/uL — ABNORMAL LOW (ref 150–400)
RBC: 3.34 MIL/uL — ABNORMAL LOW (ref 3.87–5.11)
RDW: 17.2 % — ABNORMAL HIGH (ref 11.5–15.5)
WBC: 9.6 10*3/uL (ref 4.0–10.5)
nRBC: 0 % (ref 0.0–0.2)

## 2025-01-19 LAB — FIBRINOGEN: Fibrinogen: 470 mg/dL (ref 210–475)

## 2025-01-19 LAB — CBG MONITORING, ED: Glucose-Capillary: 105 mg/dL — ABNORMAL HIGH (ref 70–99)

## 2025-01-19 LAB — COMPREHENSIVE METABOLIC PANEL WITH GFR
ALT: 27 U/L (ref 0–44)
AST: 41 U/L (ref 15–41)
Albumin: 4.1 g/dL (ref 3.5–5.0)
Alkaline Phosphatase: 174 U/L — ABNORMAL HIGH (ref 38–126)
Anion gap: 13 (ref 5–15)
BUN: 58 mg/dL — ABNORMAL HIGH (ref 8–23)
CO2: 25 mmol/L (ref 22–32)
Calcium: 9.7 mg/dL (ref 8.9–10.3)
Chloride: 95 mmol/L — ABNORMAL LOW (ref 98–111)
Creatinine, Ser: 1.3 mg/dL — ABNORMAL HIGH (ref 0.44–1.00)
GFR, Estimated: 42 mL/min — ABNORMAL LOW
Glucose, Bld: 109 mg/dL — ABNORMAL HIGH (ref 70–99)
Potassium: 4.7 mmol/L (ref 3.5–5.1)
Sodium: 133 mmol/L — ABNORMAL LOW (ref 135–145)
Total Bilirubin: 0.3 mg/dL (ref 0.0–1.2)
Total Protein: 7.1 g/dL (ref 6.5–8.1)

## 2025-01-19 LAB — TROPONIN T, HIGH SENSITIVITY
Troponin T High Sensitivity: 64 ng/L — ABNORMAL HIGH (ref 0–19)
Troponin T High Sensitivity: 67 ng/L — ABNORMAL HIGH (ref 0–19)

## 2025-01-19 LAB — I-STAT VENOUS BLOOD GAS, ED
Acid-Base Excess: 4 mmol/L — ABNORMAL HIGH (ref 0.0–2.0)
Bicarbonate: 28.5 mmol/L — ABNORMAL HIGH (ref 20.0–28.0)
Calcium, Ion: 1.14 mmol/L — ABNORMAL LOW (ref 1.15–1.40)
HCT: 32 % — ABNORMAL LOW (ref 36.0–46.0)
Hemoglobin: 10.9 g/dL — ABNORMAL LOW (ref 12.0–15.0)
O2 Saturation: 88 %
Potassium: 4.4 mmol/L (ref 3.5–5.1)
Sodium: 132 mmol/L — ABNORMAL LOW (ref 135–145)
TCO2: 30 mmol/L (ref 22–32)
pCO2, Ven: 42.1 mmHg — ABNORMAL LOW (ref 44–60)
pH, Ven: 7.439 — ABNORMAL HIGH (ref 7.25–7.43)
pO2, Ven: 54 mmHg — ABNORMAL HIGH (ref 32–45)

## 2025-01-19 LAB — CORTISOL: Cortisol, Plasma: 15.7 ug/dL

## 2025-01-19 LAB — PROTIME-INR
INR: 1.1 (ref 0.8–1.2)
Prothrombin Time: 15.1 s (ref 11.4–15.2)

## 2025-01-19 LAB — I-STAT CG4 LACTIC ACID, ED
Lactic Acid, Venous: 1.9 mmol/L (ref 0.5–1.9)
Lactic Acid, Venous: 1.9 mmol/L (ref 0.5–1.9)

## 2025-01-19 LAB — ECHOCARDIOGRAM COMPLETE
AR max vel: 1.77 cm2
AV Area VTI: 1.95 cm2
AV Area mean vel: 1.9 cm2
AV Mean grad: 7 mmHg
AV Peak grad: 12.1 mmHg
Ao pk vel: 1.74 m/s
Area-P 1/2: 2.83 cm2
Calc EF: 50.2 %
Height: 62 in
S' Lateral: 3.5 cm
Single Plane A2C EF: 50.1 %
Single Plane A4C EF: 49.2 %
Weight: 2740.76 [oz_av]

## 2025-01-19 LAB — TSH: TSH: 11.7 u[IU]/mL — ABNORMAL HIGH (ref 0.350–4.500)

## 2025-01-19 LAB — TYPE AND SCREEN
ABO/RH(D): O POS
Antibody Screen: NEGATIVE

## 2025-01-19 LAB — GAMMA GT: GGT: 11 U/L (ref 7–50)

## 2025-01-19 LAB — MRSA NEXT GEN BY PCR, NASAL: MRSA by PCR Next Gen: NOT DETECTED

## 2025-01-19 LAB — AMMONIA: Ammonia: 22 umol/L (ref 9–35)

## 2025-01-19 LAB — MAGNESIUM: Magnesium: 2 mg/dL (ref 1.7–2.4)

## 2025-01-19 LAB — APTT: aPTT: 36 s (ref 24–36)

## 2025-01-19 LAB — PRO BRAIN NATRIURETIC PEPTIDE: Pro Brain Natriuretic Peptide: 653 pg/mL — ABNORMAL HIGH

## 2025-01-19 LAB — PHOSPHORUS: Phosphorus: 3.3 mg/dL (ref 2.5–4.6)

## 2025-01-19 LAB — T4, FREE: Free T4: 0.93 ng/dL (ref 0.80–2.00)

## 2025-01-19 MED ORDER — PROSOURCE TF20 ENFIT COMPATIBL EN LIQD
60.0000 mL | Freq: Two times a day (BID) | ENTERAL | Status: AC
  Administered 2025-01-20 – 2025-01-21 (×4): 60 mL
  Filled 2025-01-19 (×4): qty 60

## 2025-01-19 MED ORDER — SENNA 8.6 MG PO TABS
1.0000 | ORAL_TABLET | Freq: Two times a day (BID) | ORAL | Status: AC | PRN
  Filled 2025-01-19: qty 1

## 2025-01-19 MED ORDER — SODIUM CHLORIDE 0.9 % IV SOLN
2.0000 g | Freq: Once | INTRAVENOUS | Status: AC
  Administered 2025-01-19: 2 g via INTRAVENOUS
  Filled 2025-01-19: qty 12.5

## 2025-01-19 MED ORDER — PROSOURCE TF20 ENFIT COMPATIBL EN LIQD
60.0000 mL | Freq: Every day | ENTERAL | Status: DC
  Administered 2025-01-19: 60 mL
  Filled 2025-01-19: qty 60

## 2025-01-19 MED ORDER — ORAL CARE MOUTH RINSE
15.0000 mL | OROMUCOSAL | Status: DC
  Administered 2025-01-19 – 2025-01-20 (×2): 15 mL via OROMUCOSAL

## 2025-01-19 MED ORDER — POLYETHYLENE GLYCOL 3350 17 G PO PACK: 17.0000 g | PACK | Freq: Every day | ORAL | Status: AC | PRN

## 2025-01-19 MED ORDER — IOHEXOL 350 MG/ML SOLN
75.0000 mL | Freq: Once | INTRAVENOUS | Status: AC | PRN
  Administered 2025-01-19: 75 mL via INTRAVENOUS

## 2025-01-19 MED ORDER — ORAL CARE MOUTH RINSE: 15.0000 mL | OROMUCOSAL | Status: DC | PRN

## 2025-01-19 MED ORDER — LEVETIRACETAM (KEPPRA) 500 MG/5 ML ADULT IV PUSH
500.0000 mg | Freq: Two times a day (BID) | INTRAVENOUS | Status: AC
  Administered 2025-01-19 – 2025-01-21 (×6): 500 mg via INTRAVENOUS
  Filled 2025-01-19 (×6): qty 5

## 2025-01-19 MED ORDER — LACTATED RINGERS IV SOLN: INTRAVENOUS | Status: DC

## 2025-01-19 MED ORDER — VANCOMYCIN HCL 1750 MG/350ML IV SOLN
1750.0000 mg | Freq: Once | INTRAVENOUS | Status: AC
  Administered 2025-01-19: 1750 mg via INTRAVENOUS
  Filled 2025-01-19: qty 350

## 2025-01-19 MED ORDER — ATROPINE SULFATE 1 MG/10ML IJ SOSY: 0.4000 mg | PREFILLED_SYRINGE | INTRAMUSCULAR | Status: AC | PRN

## 2025-01-19 MED ORDER — CHLORHEXIDINE GLUCONATE CLOTH 2 % EX PADS
6.0000 | MEDICATED_PAD | Freq: Every day | CUTANEOUS | Status: AC
  Administered 2025-01-19 – 2025-01-21 (×3): 6 via TOPICAL

## 2025-01-19 MED ORDER — OSMOLITE 1.5 CAL PO LIQD
1000.0000 mL | ORAL | Status: DC
  Administered 2025-01-19: 1000 mL

## 2025-01-19 MED ORDER — LACTATED RINGERS IV BOLUS
1000.0000 mL | Freq: Once | INTRAVENOUS | Status: AC
  Administered 2025-01-19: 1000 mL via INTRAVENOUS

## 2025-01-19 MED ORDER — DEXTROSE 50 % IV SOLN
25.0000 g | INTRAVENOUS | Status: AC
  Administered 2025-01-19: 25 g via INTRAVENOUS
  Filled 2025-01-19: qty 50

## 2025-01-19 NOTE — Progress Notes (Signed)
 Echocardiogram 2D Echocardiogram has been performed.  Deborah Bryant 01/19/2025, 2:13 PM

## 2025-01-19 NOTE — Progress Notes (Signed)
 Pt presented to [facility] after a fall. No AC. She is hypothemic but is alert with a nonfocal neurologic exam. Workup revealing acute R frontal SDH, large falcine SDH. Recommend obtaining CTA head, coag labs, repeat CTH 6H, Keppra  500mg  BID, SBP<150.   Jyrah Blye CAYLIN Zora Glendenning, PA-C

## 2025-01-19 NOTE — Evaluation (Signed)
 Speech Language Pathology Evaluation Patient Details Name: Deborah Bryant MRN: [id number] DOB: 1946/03/30 Today's Date: 01/19/2025 Time: 1130-1141 SLP Time Calculation (min) (ACUTE ONLY): 11 min  Problem List:  Patient Active Problem List   Diagnosis Date Noted   SDH (subdural hematoma) (HCC) 01/19/2025   Bradycardia 01/19/2025   Encephalopathy acute 01/19/2025   Subdural bleeding (HCC) 01/19/2025   Hypoglycemia 10/31/2024   Hyperglycemia due to type 2 diabetes mellitus (HCC) 10/31/2024   Primary localized osteoarthrosis of multiple sites 10/31/2024   Elevated alkaline phosphatase level 10/31/2024   Elevated serum creatinine 10/31/2024   Grade I diastolic dysfunction 10/31/2024   Postmenopausal vaginal bleeding 02/13/2024   Chronic obstructive pulmonary disease (HCC) 02/13/2024   Acute respiratory failure with hypoxia (HCC) 12/28/2023   Hyponatremia 12/28/2023   Chronic pain 12/28/2023   Prolonged QT interval 12/28/2023   Acute on chronic diastolic CHF (congestive heart failure) (HCC) 12/28/2023   Left lower lobe pneumonia 12/28/2023   Acute encephalopathy 12/28/2023   Hypothermia 12/28/2023   Bilateral leg edema 08/19/2023   Chronic pain of both knees 08/19/2023   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   Confusion 11/01/2019   Anosmia 11/01/2019   Ageusia 11/01/2019   Loss of smell 02/02/2019   Loss of taste 02/02/2019   Degenerative spondylolisthesis 09/29/2018   Chronic cough 09/09/2018   Low back pain 06/09/2018   Shoulder pain, bilateral 03/13/2018   Acute renal failure superimposed on stage 2 chronic kidney disease 07/03/2017   IBS (irritable bowel syndrome) 07/03/2017   Vitamin D  deficiency 08/01/2015   Mixed simple and mucopurulent chronic bronchitis (HCC) 06/11/2015   Chorioretinal scar, 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 Jon Jacob    Cellulitis, periorbital 2006   left eye, due to MRSA, saw Dr. Arlana    Chronic kidney disease    sees Dr. Curtis Heman   Complication of anesthesia    COPD (chronic obstructive pulmonary disease) (HCC)    Cough    Diabetes mellitus    Dyspnea    with exertion   GERD (gastroesophageal reflux disease)    OTC  Pepcid    Hemorrhoids    Hyperlipidemia    Hypertension    IBS (irritable bowel syndrome)    Loss of smell    Loss of taste    Memory changes    Menopause    MRSA (methicillin resistant Staphylococcus aureus) 2006   left orbit    Pneumonia    PONV (postoperative nausea and vomiting)    Tubular adenoma of colon 12/2006   Past Surgical History:  Past Surgical History:  Procedure Laterality Date   APPENDECTOMY     BACK SURGERY     COLONOSCOPY  04-01-12   per Dr. Aneita, tubular adenoma, repeat in 5 yrs    CYSTOSCOPY WITH BIOPSY N/A 12/11/2021   Procedure: CYSTOSCOPY WITH BIOPSY;  Surgeon: Elisabeth Valli BIRCH, MD;  Location: [facility];  Service: Urology;  Laterality: N/A;   CYSTOSCOPY WITH FULGERATION N/A 12/11/2021   Procedure: CYSTOSCOPY WITH FULGERATION;  Surgeon: Elisabeth Valli BIRCH, MD;  Location: [facility];  Service: Urology;  Laterality: N/A;   POLYPECTOMY     TUBAL  LIGATION     HPI:  Deborah Bryant is a 79 y.o. female who was admitted to ED from home 2/4 after recent falls, deterioration in speech.  Dx acute R frontal SDH, large falcine SDH. Admitted in Nov 2025 for hypothermia, hypoglycemia and has not returned to baseline after admission. PMH includes PNA, MRSA 2006, HTN, HLD, GERD, T2DM, dyspnea, COPD, CKD.   Assessment / Plan / Recommendation Clinical Impression  Pt participated in limited cognitive assessment.  She is abulic - non-verbal,  demonstrates severe deficits in initiation and responses are either absent or only after significant delay. Despite multimodal cues, she did not verbalize during our session. She followed single step commands inconsistently. Tendency was to look at examiner with no initiation, no spontaneous movements or voicing noted. SLP will follow for cognition and ongoing assessment.    SLP Assessment  SLP Recommendation/Assessment: Patient needs continued Speech Language Pathology Services SLP Visit Diagnosis: Cognitive communication deficit (R41.841)     Assistance Recommended at Discharge     Functional Status Assessment Patient has had a recent decline in their functional status and demonstrates the ability to make significant improvements in function in a reasonable and predictable amount of time.  Frequency and Duration min 2x/week  2 weeks      SLP Evaluation Cognition  Overall Cognitive Status: Impaired/Different from baseline Arousal/Alertness: Awake/alert Orientation Level: Disoriented X4 Attention:  (difficulty initiating, sustaining, and shifting attention) Awareness: Impaired Safety/Judgment: Impaired       Comprehension       Expression Verbal Expression Overall Verbal Expression:  (non verbal)   Oral / Motor  Oral Motor/Sensory Function Overall Oral Motor/Sensory Function: Within functional limits Motor Speech Overall Motor Speech:  (non verbal)            Deborah Bryant 01/19/2025, 12:07 PM Deborah Bryant L. Vona, MA CCC/SLP Clinical Specialist - Acute Care SLP Acute Rehabilitation Services Office number [phone]

## 2025-01-19 NOTE — H&P (Signed)
 "  NAME:  Deborah Bryant, MRN:  [id number], DOB:  1946-06-24, LOS: 0 ADMISSION DATE:  01/19/2025, CONSULTATION DATE:  01/19/25 REFERRING MD:  EDP, CHIEF COMPLAINT:  SDH   History of Present Illness:  79 yo female presented 2/2 ams. Arrives via EMS who provides history. Pt is altered and unreliable historian at this time so all information is obtained from chart. Pt in hospital in November for hypothermia and hypoglycemia. She was in hospital for about 1 week and discharged. Pt has not returned to her baseline since discharge but has declined further over past week or 2 with increasing falls.   Pt has becoming increasingly somnolent with incomprehensible speech. She has not been able to fluently or rapidly communicate since her admission for hypoglycemia in November.   Over the past 24 hours pt has had 2 falls. Denies pain. Updated pt's nephew at bedside.   Pertinent  Medical History  Htn Hyperlipidemia Gerd Chronic pain T2dm with hypoglycemia Copd IBS ckd3a  Significant Hospital Events: Including procedures, antibiotic start and stop dates in addition to other pertinent events   Admitted to ICU after sdh 2/6  Interim History / Subjective:    Objective    Blood pressure (!) 131/48, pulse (!) 50, temperature (!) 93.1 F (33.9 C), resp. rate 18, height 5' 2 (1.575 m), weight 77.7 kg, SpO2 96%.       No intake or output data in the 24 hours ending 01/19/25 0511 Filed Weights   01/19/25 0208  Weight: 77.7 kg    Examination: General: nad, laying flat supine in bed, slowed speech and delayed responses HENT: [place], eomi, perrla, mmmp, rosacea noted Lungs: coarse bilaterally Cardiovascular: bradycardic but regular Abdomen: soft, nt/nd bs+ Extremities: + pitting edema to knee bilaterally, + petechiae bilaterally and on neck Neuro: follows commands weakly and with delay. Delayed responses, awake and can answer her name but does not respond to place or time GU: deferred  Resolved  problem list   Assessment and Plan   Acute sdh Acute encephalopathy 2/2 above Fall Hypothermia Hypothyroidism Hyponatremia Ckd3a Elevated alk phos Normocytic anemia Thrombocytopenia petechiae -per neurosx -cta pending -repeat cth 6 hours after initial -keppra  -q1h neurochecks -sbp <150 goal -t4 just within normal range at 0.9, may warrant levothyroxine, check t3. Tsh >11 -check ggt for elevated alk phos -trend hgb/plts -cont volume to help with hyponatremia -follow cr/uop -will need pt/ot   Labs   CBC: Recent Labs  Lab 01/19/25 0216 01/19/25 0222  WBC 9.6  --   NEUTROABS 8.1*  --   HGB 10.1* 10.9*  HCT 30.2* 32.0*  MCV 90.4  --   PLT 127*  --     Basic Metabolic Panel: Recent Labs  Lab 01/19/25 0216 01/19/25 0222  NA 133* 132*  K 4.7 4.4  CL 95*  --   CO2 25  --   GLUCOSE 109*  --   BUN 58*  --   CREATININE 1.30*  --   CALCIUM  9.7  --   MG 2.0  --    GFR: Estimated Creatinine Clearance: 34.4 mL/min (A) (by C-G formula based on SCr of 1.3 mg/dL (H)). Recent Labs  Lab 01/19/25 0216 01/19/25 0223 01/19/25 0428  WBC 9.6  --   --   LATICACIDVEN  --  1.9 1.9    Liver Function Tests: Recent Labs  Lab 01/19/25 0216  AST 41  ALT 27  ALKPHOS 174*  BILITOT 0.3  PROT 7.1  ALBUMIN 4.1   No  results for input(s): LIPASE, AMYLASE in the last 168 hours. Recent Labs  Lab 01/19/25 0322  AMMONIA 22    ABG    Component Value Date/Time   HCO3 28.5 (H) 01/19/2025 0222   TCO2 30 01/19/2025 0222   ACIDBASEDEF 4.0 (H) 12/28/2023 1939   O2SAT 88 01/19/2025 0222     Coagulation Profile: No results for input(s): INR, PROTIME in the last 168 hours.  Cardiac Enzymes: No results for input(s): CKTOTAL, CKMB, CKMBINDEX, TROPONINI in the last 168 hours.  HbA1C: Hgb A1c MFr Bld  Date/Time Value Ref Range Status  08/19/2024 10:41 AM 6.7 (H) 4.6 - 6.5 % Final    Comment:    Glycemic Control Guidelines for People with Diabetes:Non  Diabetic:  <6%Goal of Therapy: <7%Additional Action Suggested:  >8%   08/19/2023 11:45 AM 6.3 4.6 - 6.5 % Final    Comment:    Glycemic Control Guidelines for People with Diabetes:Non Diabetic:  <6%Goal of Therapy: <7%Additional Action Suggested:  >8%     CBG: Recent Labs  Lab 01/19/25 0206  GLUCAP 105*    Review of Systems:   As per HPI  Past Medical History:  She,  has a past medical history of Allergic rhinitis, Anxiety, Arthritis, Cellulitis, periorbital (2006), Chronic kidney disease, Complication of anesthesia, COPD (chronic obstructive pulmonary disease) (HCC), Cough, Diabetes mellitus, Dyspnea, GERD (gastroesophageal reflux disease), Hemorrhoids, Hyperlipidemia, Hypertension, IBS (irritable bowel syndrome), Loss of smell, Loss of taste, Memory changes, Menopause, MRSA (methicillin resistant Staphylococcus aureus) (2006), Pneumonia, PONV (postoperative nausea and vomiting), and Tubular adenoma of colon (12/2006).   Surgical History:   Past Surgical History:  Procedure Laterality Date   APPENDECTOMY     BACK SURGERY     COLONOSCOPY  04-01-12   per Dr. Aneita, tubular adenoma, repeat in 5 yrs    CYSTOSCOPY WITH BIOPSY N/A 12/11/2021   Procedure: CYSTOSCOPY WITH BIOPSY;  Surgeon: Elisabeth Valli BIRCH, MD;  Location: [facility];  Service: Urology;  Laterality: N/A;   CYSTOSCOPY WITH FULGERATION N/A 12/11/2021   Procedure: CYSTOSCOPY WITH FULGERATION;  Surgeon: Elisabeth Valli BIRCH, MD;  Location: [facility];  Service: Urology;  Laterality: N/A;   POLYPECTOMY     TUBAL LIGATION       Social History:   reports that she has been smoking cigarettes. She has a 0.5 pack-year smoking history. She has never used smokeless tobacco. She reports that she does not currently use alcohol. She reports that she does not use drugs.   Family History:  Her family history includes Alcohol abuse (age of onset: 7) in her mother; Arthritis in her sister; Dementia in her maternal grandfather; Diabetes in her  brother and mother; Heart disease in her brother; Heart disease (age of onset: 71) in her father.   Allergies Allergies[1]   Home Medications  Prior to Admission medications  Medication Sig Start Date End Date Taking? Authorizing Provider  albuterol  (VENTOLIN  HFA) 108 (90 Base) MCG/ACT inhaler Inhale 2 puffs into the lungs every 6 (six) hours as needed for wheezing or shortness of breath. 04/27/24   Kara Dorn NOVAK, MD  allopurinol  (ZYLOPRIM ) 300 MG tablet Take 1 tablet (300 mg total) by mouth daily. Patient taking differently: Take 150 mg by mouth daily. 08/19/24   Johnny Garnette LABOR, MD  amLODipine  (NORVASC ) 5 MG tablet Take 1 tablet (5 mg total) by mouth daily. 01/17/25   Pietro Redell RAMAN, MD  aspirin  EC 81 MG tablet Take 81 mg by mouth daily.    [provider]  atorvastatin  (LIPITOR) 40 MG tablet Take 1 tablet (40 mg total) by mouth daily. 08/19/24   Johnny Garnette LABOR, MD  Blood Glucose Monitoring Suppl DEVI Dispense based on patient and insurance preference. Use up to four times daily as directed. (FOR ICD-10 E10.9, E11.9). 11/04/24   Amin, Ankit C, MD  Cholecalciferol  (VITAMIN D ) 2000 units CAPS Take 2,000 Units by mouth daily. OTC    [provider]  Continuous Glucose Sensor (FREESTYLE LIBRE 3 PLUS SENSOR) MISC Change sensor every 15 days. 11/04/24   Amin, Ankit C, MD  diazepam  (VALIUM ) 5 MG tablet Take 1 tablet (5 mg total) by mouth every 8 (eight) hours as needed for muscle spasms. 08/19/24   Johnny Garnette LABOR, MD  famotidine  (PEPCID ) 20 MG tablet Take 20 mg by mouth daily.    [provider]  furosemide  (LASIX ) 40 MG tablet Take 40 mg by mouth 2 (two) times daily.    [provider]  Glucose Blood (BLOOD GLUCOSE TEST STRIPS) STRP Use up to four times daily as directed. 11/04/24   Amin, Burgess BROCKS, MD  HYDROcodone -acetaminophen  (NORCO) 5-325 MG tablet Take 1 tablet by mouth every 6 (six) hours as needed for moderate pain (pain score 4-6). 11/18/24   Johnny Garnette LABOR, MD   hydroxychloroquine  (PLAQUENIL ) 200 MG tablet Take 400 mg by mouth daily.    [provider]  Insulin  Pen Needle (PEN NEEDLES) 31G X 5 MM MISC Use up to four times daily as directed. 11/04/24   Caleen Burgess BROCKS, MD  Lancet Device MISC Dispense based on patient and insurance preference. Use up to four times daily as directed. (FOR ICD-10 E10.9, E11.9). 11/04/24   Caleen Burgess BROCKS, MD  Lancets MISC Use up to four times daily as directed. 11/04/24   Amin, Ankit C, MD  LASIX  20 MG tablet Take 4 tablets (80 mg total) by mouth daily. Taking 2 tablets daily 01/03/24   Ghimire, Donalda HERO, MD  polyethylene glycol powder (GLYCOLAX /MIRALAX ) powder Take 17 g by mouth daily as needed for mild constipation or moderate constipation.    [provider]  potassium chloride  (KLOR-CON  10) 10 MEQ tablet Take 2 tablets (20 mEq total) by mouth daily. 01/03/24   Ghimire, Donalda HERO, MD  saccharomyces boulardii (FLORASTOR) 250 MG capsule Take 250 mg by mouth daily.    [provider]  Tiotropium Bromide-Olodaterol (STIOLTO RESPIMAT ) 2.5-2.5 MCG/ACT AERS Inhale 2 puffs into the lungs daily. 09/10/24   Kara Dorn NOVAK, MD     Critical care time: The patient is critically ill with multiple organ systems failure and requires high complexity decision making for assessment and support, frequent evaluation and titration of therapies, application of advanced monitoring technologies and extensive interpretation of multiple databases.  Critical care time 40 mins. This represents my time independent of the NPs time taking care of the pt. This is excluding procedures.    Harlene Na DO  Pulmonary and Critical Care 01/19/2025, 5:11 AM See Amion for pager If no response to pager, please call [phone] until 1900 After 1900 please call EMA [phone]             [1]  Allergies Allergen Reactions   Losartan  Shortness Of Breath   Benazepril  Cough   Lasix  [Furosemide ] Other (See Comments)     Rash, changes in eyesight. Currently taking this at home per pt.    Levaquin [Levofloxacin] Other (See Comments)    Tendon and muscle pain   Phenobarbital Nausea And  Vomiting   "

## 2025-01-19 NOTE — Progress Notes (Signed)
 eLink Physician-Brief Progress Note Patient Name: Deborah Bryant DOB: [birth year] MRN: [id number]   Date of Service  01/19/2025  HPI/Events of Note  68F with DM2 and prior hospitalization in Nov for hypoglycemia, bradycardia and hypothermia who presents with confusion after recent falls fall last night. CT head with R frontal and large falcine SDH and CTA neg. NSGY consulted. PCCM admitted  No vent or pressor need on camera check  eICU Interventions  NPO Neuro checks SBP goal <150 CT head 6 h after initial imaging Keppra      Intervention Category Evaluation Type: New Patient Evaluation  Harlan Ervine Slater Staff 01/19/2025, 6:16 AM

## 2025-01-19 NOTE — ED Provider Notes (Signed)
 " [place] EM[facility]GENC[facility] DEPARTMENT AT [facility] Provider Note   CSN: [id number] Arrival date & time: 01/19/25  0154     Patient presents with: [place]   Deborah Bryant is a 79 y.o. female.   79 year old female brought in by EMS secondary to altered mental status.  Patient not able to give much history.  Initially obtained from EMS and then her husband on the phone and subsequently her nephew in the room.  Patient was admitted in November for hypoglycemia.  At that time she did have hypothermia and bradycardia as well but not as bad as today.  She was in hospital for about a week and got somewhat better and was discharged.  Since that time she is never really returned to her prehypoglycemia self.  She is remained pretty sleepy and just not quick to respond like she normally would.  However over the last 2 weeks she has had a more persistent decline until tonight where she was very sleepy, incomprehensible speech.  She had 2 falls in last 24 hours but multiple falls over the last couple weeks.  EMS was called.  Her blood sugar with them was normal.  Brought here for further evaluation.  Patient is able to answer yes/no questions but not able to really give a history.  She denies fever or cough.  She denies any pain anywhere.   Fall       Prior to Admission medications  Medication Sig Start Date End Date Taking? Authorizing Provider  albuterol  (VENTOLIN  HFA) 108 (90 Base) MCG/ACT inhaler Inhale 2 puffs into the lungs every 6 (six) hours as needed for wheezing or shortness of breath. 04/27/24   Kara Dorn NOVAK, MD  allopurinol  (ZYLOPRIM ) 300 MG tablet Take 1 tablet (300 mg total) by mouth daily. Patient taking differently: Take 150 mg by mouth daily. 08/19/24   Johnny Garnette LABOR, MD  amLODipine  (NORVASC ) 5 MG tablet Take 1 tablet (5 mg total) by mouth daily. 01/17/25   Pietro Redell GORMAN, MD  aspirin  EC 81 MG tablet Take 81 mg by mouth daily.    [provider]  atorvastatin   (LIPITOR) 40 MG tablet Take 1 tablet (40 mg total) by mouth daily. 08/19/24   Johnny Garnette LABOR, MD  Blood Glucose Monitoring Suppl DEVI Dispense based on patient and insurance preference. Use up to four times daily as directed. (FOR ICD-10 E10.9, E11.9). 11/04/24   Amin, Ankit C, MD  Cholecalciferol  (VITAMIN D ) 2000 units CAPS Take 2,000 Units by mouth daily. OTC    [provider]  Continuous Glucose Sensor (FREESTYLE LIBRE 3 PLUS SENSOR) MISC Change sensor every 15 days. 11/04/24   Amin, Ankit C, MD  diazepam  (VALIUM ) 5 MG tablet Take 1 tablet (5 mg total) by mouth every 8 (eight) hours as needed for muscle spasms. 08/19/24   Johnny Garnette LABOR, MD  famotidine  (PEPCID ) 20 MG tablet Take 20 mg by mouth daily.    [provider]  furosemide  (LASIX ) 40 MG tablet Take 40 mg by mouth 2 (two) times daily.    [provider]  Glucose Blood (BLOOD GLUCOSE TEST STRIPS) STRP Use up to four times daily as directed. 11/04/24   Amin, Ankit C, MD  HYDROcodone -acetaminophen  (NORCO) 5-325 MG tablet Take 1 tablet by mouth every 6 (six) hours as needed for moderate pain (pain score 4-6). 11/18/24   Johnny Garnette LABOR, MD  HYDROcodone -acetaminophen  (NORCO) 5-325 MG tablet Take 1 tablet by mouth every 6 (six) hours as  needed for moderate pain (pain score 4-6). 11/18/24   Johnny Garnette LABOR, MD  HYDROcodone -acetaminophen  (NORCO) 5-325 MG tablet Take 1 tablet by mouth every 6 (six) hours as needed for moderate pain (pain score 4-6). 11/18/24   Johnny Garnette LABOR, MD  hydroxychloroquine  (PLAQUENIL ) 200 MG tablet Take 400 mg by mouth daily.    [provider]  Insulin  Pen Needle (PEN NEEDLES) 31G X 5 MM MISC Use up to four times daily as directed. 11/04/24   Caleen Burgess BROCKS, MD  Lancet Device MISC Dispense based on patient and insurance preference. Use up to four times daily as directed. (FOR ICD-10 E10.9, E11.9). 11/04/24   Caleen Burgess BROCKS, MD  Lancets MISC Use up to four times daily as directed. 11/04/24   Amin,  Ankit C, MD  LASIX  20 MG tablet Take 4 tablets (80 mg total) by mouth daily. Taking 2 tablets daily 01/03/24   Ghimire, Donalda HERO, MD  polyethylene glycol powder (GLYCOLAX /MIRALAX ) powder Take 17 g by mouth daily as needed for mild constipation or moderate constipation.    [provider]  potassium chloride  (KLOR-CON  10) 10 MEQ tablet Take 2 tablets (20 mEq total) by mouth daily. 01/03/24   Ghimire, Donalda HERO, MD  saccharomyces boulardii (FLORASTOR) 250 MG capsule Take 250 mg by mouth daily.    [provider]  Tiotropium Bromide-Olodaterol (STIOLTO RESPIMAT ) 2.5-2.5 MCG/ACT AERS Inhale 2 puffs into the lungs daily. 09/10/24   Kara Dorn NOVAK, MD    Allergies: Losartan , Benazepril , Lasix  [furosemide ], Levaquin [levofloxacin], and Phenobarbital    Review of Systems  Updated Vital Signs BP (!) 136/47   Pulse (!) 43   Temp (!) 90.2 F (32.3 C)   Resp 16   Ht 5' 2 (1.575 m)   Wt 77.7 kg   SpO2 97%   BMI 31.33 kg/m   Physical Exam Vitals and nursing note reviewed.  Constitutional:      Appearance: She is well-developed.  HENT:     Head: Normocephalic and atraumatic.  Cardiovascular:     Rate and Rhythm: Regular rhythm. Bradycardia present.  Pulmonary:     Effort: No respiratory distress.     Breath sounds: No stridor.  Abdominal:     General: There is no distension.  Musculoskeletal:     Cervical back: Normal range of motion.  Neurological:     Mental Status: She is alert.     (all labs ordered are listed, but only abnormal results are displayed) Labs Reviewed  CBC WITH DIFFERENTIAL/PLATELET - Abnormal; Notable for the following components:      Result Value   RBC 3.34 (*)    Hemoglobin 10.1 (*)    HCT 30.2 (*)    RDW 17.2 (*)    Platelets 127 (*)    Neutro Abs 8.1 (*)    All other components within normal limits  COMPREHENSIVE METABOLIC PANEL WITH GFR - Abnormal; Notable for the following components:   Sodium 133 (*)    Chloride 95 (*)     Glucose, Bld 109 (*)    BUN 58 (*)    Creatinine, Ser 1.30 (*)    Alkaline Phosphatase 174 (*)    GFR, Estimated 42 (*)    All other components within normal limits  URINALYSIS, ROUTINE W REFLEX MICROSCOPIC - Abnormal; Notable for the following components:   Color, Urine STRAW (*)    All other components within normal limits  PRO BRAIN NATRIURETIC PEPTIDE - Abnormal; Notable for the following components:  Pro Brain Natriuretic Peptide 653.0 (*)    All other components within normal limits  TSH - Abnormal; Notable for the following components:   TSH 11.700 (*)    All other components within normal limits  CBG MONITORING, ED - Abnormal; Notable for the following components:   Glucose-Capillary 105 (*)    All other components within normal limits  I-STAT VENOUS BLOOD GAS, ED - Abnormal; Notable for the following components:   pH, Ven 7.439 (*)    pCO2, Ven 42.1 (*)    pO2, Ven 54 (*)    Bicarbonate 28.5 (*)    Acid-Base Excess 4.0 (*)    Sodium 132 (*)    Calcium , Ion 1.14 (*)    HCT 32.0 (*)    Hemoglobin 10.9 (*)    All other components within normal limits  TROPONIN T, HIGH SENSITIVITY - Abnormal; Notable for the following components:   Troponin T High Sensitivity 64 (*)    All other components within normal limits  CULTURE, BLOOD (ROUTINE X 2)  CULTURE, BLOOD (ROUTINE X 2)  MAGNESIUM   T4, FREE  AMMONIA  PROTIME-INR  APTT  I-STAT CG4 LACTIC ACID, ED  I-STAT CG4 LACTIC ACID, ED  TROPONIN T, HIGH SENSITIVITY    EKG: None  Radiology: CT Head Wo Contrast Result Date: 01/19/2025 EXAM: CT HEAD WITHOUT CONTRAST 01/19/2025 03:10:23 AM TECHNIQUE: CT of the head was performed without the administration of intravenous contrast. Automated exposure control, iterative reconstruction, and/or weight based adjustment of the mA/kV was utilized to reduce the radiation dose to as low as reasonably achievable. COMPARISON: 12/28/2023 CLINICAL HISTORY: Head trauma, minor (age >= 65 years).  FINDINGS: BRAIN AND VENTRICLES: Acute subdural hemorrhage along the right frontal lobe and along the anterior falx measuring up to 1.7 cm in thickness. There is also acute subdural blood along the left tentorial leaflet. Partial effacement of the right lateral ventricle. No evidence of acute infarct. No hydrocephalus. No extra-axial collection. No mass effect. ORBITS: No acute abnormality. SINUSES: No acute abnormality. SOFT TISSUES AND SKULL: No acute soft tissue abnormality. Traumatic brain injury risk stratification: Subdural hematoma (SDH): >8 mm (MBIG 3). Subarachnoid hemorrhage [facility]): None (MBIG 1). Epidural hematoma (EDH): No (MBIG 1). Cerebral contusion, intra-axial, intraparenchymal hemorrhage (IPH): None (MBIG 1). Intraventricular hemorrhage (IVH): No (MBIG 1). Midline shift > 1mm or edema/effacement of sulci/vents: no Skull fracture: none (MBIG 1). IMPRESSION: 1. Acute subdural hemorrhage along the right frontal lobe and anterior falx measuring up to 1.7 cm in thickness, also involving the left tentorial leaflet. 2. Findings discussed with Dr. Selinda Sias at 3:23 AM on 01/19/25. Electronically signed by: Franky Stanford MD 01/19/2025 03:24 AM EST RP Workstation: [id number]   DG Chest Portable 1 View Result Date: 01/19/2025 EXAM: 1 VIEW(S) XRAY OF THE CHEST 01/19/2025 02:26:00 AM COMPARISON: 11/02/2024 CLINICAL HISTORY: Evaluation for rales. FINDINGS: LUNGS AND PLEURA: Pulmonary interstitial prominence with mild pulmonary edema. Persistent linear opacities at left base. No pleural effusion. No pneumothorax. HEART AND MEDIASTINUM: Cardiomegaly. Calcified aorta. BONES AND SOFT TISSUES: No acute osseous abnormality. IMPRESSION: 1. Mild pulmonary edema with persistent left basilar subsegmental atelectasis or scarring. 2. Cardiomegaly and aortic atherosclerosis. Electronically signed by: Greig Pique MD 01/19/2025 02:32 AM EST RP Workstation: [id number]     Procedures   Medications Ordered in the ED   vancomycin  (VANCOREADY) IVPB 1750 mg/350 mL (has no administration in time range)  ceFEPIme  (MAXIPIME ) 2 g in sodium chloride  0.9 % 100 mL IVPB (has no administration in time range)  atropine  1 MG/10ML injection 0.4 mg (has no administration in time range)  lactated ringers  bolus 1,000 mL (0 mLs Intravenous Stopped 01/19/25 0430)  iohexol  (OMNIPAQUE ) 350 MG/ML injection 75 mL (75 mLs Intravenous Contrast Given 01/19/25 0452)                                    Medical Decision Making Amount and/or Complexity of Data Reviewed Labs: ordered. Radiology: ordered. ECG/medicine tests: ordered.  Risk Prescription drug management. Decision regarding hospitalization.   Patient bradycardic in the 30s and hypothermic to 88 on arrival.  Awake and alert and oriented but just very very slow to answer questions.  Initial thought was hypothyroidism versus sepsis.  Altered mental status workup initiated as well.  Antibiotics started after blood cultures.  Bair hugger applied.  Pacer pads applied.  Her laboratory workup ultimately was relatively baseline.  Her heart rate improved as her temperature improved but was still bradycardic.  No beta-blockers or other medications that was suspected be related to that.  She is slightly hypothyroid but T4 is normal.  CT head showed a pretty large subdural that was acute or subacute no chronic component.  Consulted neurosurgery who recommended CTA, PT/PTT/INR which were ordered.  Discussed with Dr. Layman for admission to the ICU for monitoring.  Patient improving but still relatively unstable at time of admission.  Final diagnoses:  Subdural bleeding (HCC)  Bradycardia  Hypothermia, initial encounter    ED Discharge Orders     None          Darwyn Ponzo, Selinda, MD 01/19/25 0502  "

## 2025-01-19 NOTE — Progress Notes (Signed)
 LTM EEG hooked up and running - no initial skin breakdown - push button tested - [facility] monitoring.CT leads used

## 2025-01-19 NOTE — Progress Notes (Signed)
 Initial Nutrition Assessment  DOCUMENTATION CODES:   Not applicable  INTERVENTION:   Initiate tube feeding via Cortrak tube: Osmolite 1.5 at 40 ml/h (960 ml per day)  Prosource TF20 60 ml daily  Provides 1600 kcal, 100 gm protein, 729 ml free water  daily  Monitor magnesium  and phosphorus daily x 4 occurrences or until WNL per adult tube feeding protocol, MD to replete as needed.    NUTRITION DIAGNOSIS:   Inadequate oral intake related to acute illness as evidenced by meal completion < 50%.  GOAL:   Patient will meet greater than or equal to 90% of their needs   MONITOR:   PO intake, TF tolerance, Labs  REASON FOR ASSESSMENT:   Consult Enteral/tube feeding initiation and management  ASSESSMENT:   Pt with PMH of current smoker, PNA, HTN, HLD, GERD, DM, COPD, CKD, hypoglycemia (dx Nov 2025), CHF on lasix , IBS admitted after falls at home with acute R frontal SDH.   Pt discussed during ICU rounds and with RN and MD.  Pt seen by SLP for swallow eval, per SLP pt will need cortrak for nutrition due to her cognitive deficits (poor initiation, oral holding of all POs). Supplement with full liquid diet to allow opportunities to eat/drink.   Pt did not respond to any questions during my visit. Nutrition hx provided by husband and nephew.  Pt eats 3 times per day Breakfast: bagel and coffee Lunch: fast food (nuggets, salad or burger and baked potato  Dinner: cheese and crackers Pt snacks on candy, cookies.  She drinks water , unsweet tea, and OJ Per family she now snacks a lot after being diagnosed with hypoglycemia.  Family reports BLE swelling due to fluid PTA on lasix  at home. Uses miralax  when has constipation.   2/4 - s/p cortrak placement; tip gastric   Medications reviewed and include: Keppra  LR @ 75 ml/hr  Labs reviewed:  Na 132 TSH 11.7 T4 0.93  Refeeding Labs: Recent Labs  Lab 01/19/25 0216 01/19/25 0222  K 4.7 4.4  MG 2.0  --    Glucose Profile:   Recent Labs    01/19/25 1118 01/19/25 1154 01/19/25 1502  GLUCAP 55* 71 92   Lab Results  Component Value Date   HGBA1C 6.7 (H) 08/19/2024     NUTRITION - FOCUSED PHYSICAL EXAM:  Flowsheet Row Most Recent Value  Orbital Region No depletion  Upper Arm Region No depletion  Thoracic and Lumbar Region No depletion  Buccal Region No depletion  Temple Region No depletion  Clavicle Bone Region No depletion  Clavicle and Acromion Bone Region No depletion  Scapular Bone Region No depletion  Dorsal Hand Mild depletion  Patellar Region No depletion  Anterior Thigh Region No depletion  Posterior Calf Region Unable to assess  Edema (RD Assessment) Moderate  [BLE]  Hair Reviewed  Eyes Reviewed  Mouth Unable to assess  Skin Reviewed  Nails Reviewed    Diet Order:   Diet Order             Diet full liquid Room service appropriate? No; Fluid consistency: Thin  Diet effective now                   EDUCATION NEEDS:   Education needs have been addressed (with family)  Skin:  Skin Assessment: Reviewed RN Assessment  Last BM:  unknown; per husband takes miralax  when constipated at home  Height:   Ht Readings from Last 1 Encounters:  01/19/25 5' 2 (1.575 m)  Weight:   Wt Readings from Last 1 Encounters:  01/19/25 77.7 kg    BMI:  Body mass index is 31.33 kg/m.  Estimated Nutritional Needs:   Kcal:  1500-1700  Protein:  80-100 grams  Fluid:  > 1.5 L/day  Powell SQUIBB., RD, LDN, CNSC See AMiON for contact information

## 2025-01-19 NOTE — Progress Notes (Signed)
 Pts dexcom sensor removed from right posterior upper arm.

## 2025-01-19 NOTE — Progress Notes (Signed)
 Echocardiogram Attempted exam at 935am. Patient not in room; will attempt again later.  Juliene JINNY Rucks 01/19/2025, 9:36 AM

## 2025-01-19 NOTE — Procedures (Signed)
 Cortrak  Person Inserting Tube:  Deborah Bryant, Olivia SAUNDERS, RD Tube Type:  Cortrak - 43 inches Tube Size:  10 Tube Location:  Left nare Secured by: Bridle Initial Placement:  Gastric Technique Used to Measure Tube Placement:  Marking at nare/corner of mouth Cortrak Secured At:  65 cm Initial Placement Verification:  Cortrak device (Registered Dieticians Only)  Cortrak Tube Team Note:  Consult received to place a Cortrak feeding tube.   No x-ray is required. RN may begin using tube.   If the tube becomes dislodged please keep the tube and contact the Cortrak team at [email or web address] for replacement.  If after hours and replacement cannot be delayed, place a NG tube and confirm placement with an abdominal x-ray.    Olivia Kenning, RD Registered Dietitian  See Amion for more information

## 2025-01-19 NOTE — Progress Notes (Signed)
 "  NAME:  Deborah Bryant, MRN:  [id number], DOB:  1946/01/10, LOS: 0 ADMISSION DATE:  01/19/2025, CONSULTATION DATE:  01/19/25 REFERRING MD:  EDP, CHIEF COMPLAINT:  SDH   History of Present Illness:  79 yo female presented 2/2 ams. Arrives via EMS who provides history. Pt is altered and unreliable historian at this time so all information is obtained from chart. Pt in hospital in November for hypothermia and hypoglycemia. She was in hospital for about 1 week and discharged. Pt has not returned to her baseline since discharge but has declined further over past week or 2 with increasing falls.  Pt has becoming increasingly somnolent with incomprehensible speech. She has not been able to fluently or rapidly communicate since her admission for hypoglycemia in November.  Over the past 24 hours pt has had 2 falls. Denies pain. Updated pt's nephew at bedside.   Pertinent  Medical History  Htn Hyperlipidemia Gerd Chronic pain T2dm with hypoglycemia Copd IBS [id number]  Significant Hospital Events: Including procedures, antibiotic start and stop dates in addition to other pertinent events   Admitted to ICU after sdh 2/6  Interim History / Subjective:   Patient awake and alert, states her name, slow to respond.  Objective    Blood pressure (!) 106/27, pulse (!) 53, temperature (!) 95.7 F (35.4 C), resp. rate 17, height 5' 2 (1.575 m), weight 77.7 kg, SpO2 92%.        Intake/Output Summary (Last 24 hours) at 01/19/2025 1025 Last data filed at 01/19/2025 1000 Gross per 24 hour  Intake 1452.41 ml  Output 1275 ml  Net 177.41 ml   Filed Weights   01/19/25 0208  Weight: 77.7 kg   Physical Exam Vitals and nursing note reviewed.  Constitutional:      Comments: Acute on chronically ill appearing. Awake and alert  HENT:     Head: Normocephalic and atraumatic.     Right Ear: External ear normal.     Left Ear: External ear normal.     Nose: Nose normal.  Cardiovascular:     Rate and Rhythm:  Regular rhythm. Bradycardia present.     Pulses: Normal pulses.     Heart sounds: Normal heart sounds.  Pulmonary:     Effort: Pulmonary effort is normal.     Breath sounds: Normal breath sounds.  Abdominal:     General: Abdomen is flat. Bowel sounds are normal. There is no distension.     Palpations: Abdomen is soft.     Tenderness: There is no abdominal tenderness.  Musculoskeletal:     Right lower leg: Edema present.     Left lower leg: Edema present.  Skin:    General: Skin is warm and dry.  Neurological:     Mental Status: She is alert.     Comments: Awake, pupils equal and reactive. States her first name, but does not answer further questions. Squeezes hands bilaterally to command, slowly raises bilateral upper extremities, negative pronator drift. Moves bilateral lower extremities but unable to fully lift off of bed.         Resolved problem list   Assessment and Plan   Acute Right Frontal Subdural Hematoma  Acute Encephalopathy 2/2 above  Ground level Fall at home  -NSGY following, SBP goal <150 -Neuro checks, neuroprotective measures  -Keppra  for seizure prophylaxis -cEEG ordered  -Repeat CTH ordered  -SLP ordered, consider coretrak  Hypothermia Elevated TSH -TSH 11.7, T4 0.93, check T3 and cortisol  Bradycardia  -Cardizem  discontinued  in early 2025 due to bradycardia  -Recent admission for bradycardia (HR 50s) felt to be secondary to hypothermia -HR 40s at rest, improves to 50s with stimulation  -Continue telemetry monitoring -Consider checking orthostatics when able to work with PT  Chronic hypoxic respiratory failure History of COPD -2L [place] baseline   Hypertension Hyperlipidemia  Chronic diastolic congestive heart failure  -PTA: amlodipine  on hold for hypotension/normotension -Pending med rec confirmation, resume lasix  due to LE edema   Chronic Pain -Norco on hold  CKD 3A -monitor  Elevated alk phos -174, has been elevated since 2019; ggt  11 -consider outpatient follow up  Chronic Normocytic anemia Thrombocytopenia -monitor   Review of Systems:   See subjective  Past Medical History:  She,  has a past medical history of Allergic rhinitis, Anxiety, Arthritis, Cellulitis, periorbital (2006), Chronic kidney disease, Complication of anesthesia, COPD (chronic obstructive pulmonary disease) (HCC), Cough, Diabetes mellitus, Dyspnea, GERD (gastroesophageal reflux disease), Hemorrhoids, Hyperlipidemia, Hypertension, IBS (irritable bowel syndrome), Loss of smell, Loss of taste, Memory changes, Menopause, MRSA (methicillin resistant Staphylococcus aureus) (2006), Pneumonia, PONV (postoperative nausea and vomiting), and Tubular adenoma of colon (12/2006).   Surgical History:   Past Surgical History:  Procedure Laterality Date   APPENDECTOMY     BACK SURGERY     COLONOSCOPY  04-01-12   per Dr. Aneita, tubular adenoma, repeat in 5 yrs    CYSTOSCOPY WITH BIOPSY N/A 12/11/2021   Procedure: CYSTOSCOPY WITH BIOPSY;  Surgeon: Elisabeth Valli BIRCH, MD;  Location: [facility];  Service: Urology;  Laterality: N/A;   CYSTOSCOPY WITH FULGERATION N/A 12/11/2021   Procedure: CYSTOSCOPY WITH FULGERATION;  Surgeon: Elisabeth Valli BIRCH, MD;  Location: [facility];  Service: Urology;  Laterality: N/A;   POLYPECTOMY     TUBAL LIGATION      Critical care time: 40 minutes  Keven Fila, PA-C [facility] For pager details, please see AMION or use Epic chat  After 1900, please call [facility] for cross coverage needs 01/19/2025, 10:47 AM         "

## 2025-01-19 NOTE — Telephone Encounter (Unsigned)
 Copied from CRM #[id number]. Topic: Clinical - Order For Equipment >> Jan 19, 2025  9:50 AM Robinson DEL wrote: Reason for CRM: [facility] states they are in the process of oxygen  re certification for patient and certificate of medical necessity for oxygen  was sent to office on 12/23/2024. Following up on request.  [facility] [phone] fax

## 2025-01-19 NOTE — Progress Notes (Signed)
 Patients clinical exam remained minimal for the majority of the day. She was alert only to self and exhibited significant expressive delay, though she continued to follow commands. At approximately 1600, she became increasingly somnolent, was no longer able to verbalize her name, and demonstrated further slowing in her ability to follow commands. CCM notified. EEG initiated for overnight monitoring. Neurosurgery NP informed of the change in clinical status. No new orders at this time.

## 2025-01-19 NOTE — ED Triage Notes (Addendum)
 Pt BIB [facility] from home c/o AMS. Pts husband stated that pt has been confused since a fall around 10pm on 2/3. No known LOC. Pt is cold to touch. Denies thinners.  CBG 151

## 2025-01-19 NOTE — Plan of Care (Signed)
" °  Problem: Nutrition: Goal: Adequate nutrition will be maintained Outcome: Progressing, Cortrack placed today and tube feed initiated   Problem: Activity: Goal: Risk for activity intolerance will decrease Outcome: Not Progressing   Problem: Elimination: Goal: Will not experience complications related to bowel motility Outcome: Not Progressing   "

## 2025-01-19 NOTE — Consult Note (Cosign Needed)
 "  HPI:     Patient is a 79 y.o. female presented to [facility] ED after a fall. Was apparently admitted in Nov for hypothermia and hypoglycemia. Was discharged after about a week, but never returned to mental baseline. Has been falling more frequently.    Patient Active Problem List   Diagnosis Date Noted   SDH (subdural hematoma) (HCC) 01/19/2025   Hypoglycemia 10/31/2024   Hyperglycemia due to type 2 diabetes mellitus (HCC) 10/31/2024   Primary localized osteoarthrosis of multiple sites 10/31/2024   Elevated alkaline phosphatase level 10/31/2024   Elevated serum creatinine 10/31/2024   Grade I diastolic dysfunction 10/31/2024   Postmenopausal vaginal bleeding 02/13/2024   Chronic obstructive pulmonary disease (HCC) 02/13/2024   Acute respiratory failure with hypoxia (HCC) 12/28/2023   Hyponatremia 12/28/2023   Chronic pain 12/28/2023   Prolonged QT interval 12/28/2023   Acute on chronic diastolic CHF (congestive heart failure) (HCC) 12/28/2023   Left lower lobe pneumonia 12/28/2023   Acute encephalopathy 12/28/2023   Hypothermia 12/28/2023   Bilateral leg edema 08/19/2023   Chronic pain of both knees 08/19/2023   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   Confusion 11/01/2019   Anosmia 11/01/2019   Ageusia 11/01/2019   Loss of smell 02/02/2019   Loss of taste 02/02/2019   Degenerative spondylolisthesis 09/29/2018   Chronic cough 09/09/2018   Low back pain 06/09/2018   Shoulder pain, bilateral 03/13/2018   Acute renal failure superimposed on stage 2 chronic kidney disease 07/03/2017   IBS (irritable bowel syndrome) 07/03/2017   Vitamin D  deficiency 08/01/2015   Mixed simple and mucopurulent chronic bronchitis (HCC) 06/11/2015   Chorioretinal scar, 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   Nuclear cataract, bilateral 06/16/2014   Smoker 09/07/2010   Normocytic anemia 03/29/2008   Gout 10/07/2007   Diabetes mellitus without complication (HCC) 07/21/2007   Hyperlipemia 07/21/2007   Essential  hypertension 07/21/2007   Past Medical History:  Diagnosis Date   Allergic rhinitis    Anxiety    Arthritis    sees Dr. Jon Jacob    Cellulitis, periorbital 2006   left eye, due to MRSA, saw Dr. Arlana    Chronic kidney disease    sees Dr. Curtis Heman   Complication of anesthesia    COPD (chronic obstructive pulmonary disease) (HCC)    Cough    Diabetes mellitus    Dyspnea    with exertion   GERD (gastroesophageal reflux disease)    OTC  Pepcid    Hemorrhoids    Hyperlipidemia    Hypertension    IBS (irritable bowel syndrome)    Loss of smell    Loss of taste    Memory changes    Menopause    MRSA (methicillin resistant Staphylococcus aureus) 2006   left orbit    Pneumonia    PONV (postoperative nausea and vomiting)    Tubular adenoma of colon 12/2006    Past Surgical History:  Procedure Laterality Date   APPENDECTOMY     BACK SURGERY     COLONOSCOPY  04-01-12   per Dr. Aneita, tubular adenoma, repeat in 5 yrs    CYSTOSCOPY WITH BIOPSY N/A 12/11/2021   Procedure: CYSTOSCOPY WITH BIOPSY;  Surgeon: Elisabeth Valli BIRCH, MD;  Location: [facility];  Service: Urology;  Laterality: N/A;   CYSTOSCOPY WITH FULGERATION N/A 12/11/2021   Procedure: CYSTOSCOPY WITH FULGERATION;  Surgeon: Elisabeth Valli BIRCH, MD;  Location: [facility];  Service: Urology;  Laterality: N/A;   POLYPECTOMY     TUBAL LIGATION  Medications Prior to Admission  Medication Sig Dispense Refill Last Dose/Taking   albuterol  (VENTOLIN  HFA) 108 (90 Base) MCG/ACT inhaler Inhale 2 puffs into the lungs every 6 (six) hours as needed for wheezing or shortness of breath. 8 g 6    allopurinol  (ZYLOPRIM ) 300 MG tablet Take 1 tablet (300 mg total) by mouth daily. (Patient taking differently: Take 150 mg by mouth daily.) 90 tablet 3    amLODipine  (NORVASC ) 5 MG tablet Take 1 tablet (5 mg total) by mouth daily. 90 tablet 2    aspirin  EC 81 MG tablet Take 81 mg by mouth daily.      atorvastatin  (LIPITOR) 40 MG tablet  Take 1 tablet (40 mg total) by mouth daily. 90 tablet 3    Cholecalciferol  (VITAMIN D ) 2000 units CAPS Take 2,000 Units by mouth daily. OTC      Continuous Glucose Sensor (FREESTYLE LIBRE 3 PLUS SENSOR) MISC Change sensor every 15 days. 6 each 3    diazepam  (VALIUM ) 5 MG tablet Take 1 tablet (5 mg total) by mouth every 8 (eight) hours as needed for muscle spasms. 90 tablet 5    famotidine  (PEPCID ) 20 MG tablet Take 20 mg by mouth daily.      furosemide  (LASIX ) 40 MG tablet Take 40 mg by mouth 2 (two) times daily.      HYDROcodone -acetaminophen  (NORCO) 5-325 MG tablet Take 1 tablet by mouth every 6 (six) hours as needed for moderate pain (pain score 4-6). 120 tablet 0    hydroxychloroquine  (PLAQUENIL ) 200 MG tablet Take 400 mg by mouth daily.      polyethylene glycol powder (GLYCOLAX /MIRALAX ) powder Take 17 g by mouth daily as needed for mild constipation or moderate constipation.      potassium chloride  (KLOR-CON  10) 10 MEQ tablet Take 2 tablets (20 mEq total) by mouth daily.      saccharomyces boulardii (FLORASTOR) 250 MG capsule Take 250 mg by mouth daily.      Tiotropium Bromide-Olodaterol (STIOLTO RESPIMAT ) 2.5-2.5 MCG/ACT AERS Inhale 2 puffs into the lungs daily. 4 g 5    Allergies[1]  Social History   Tobacco Use   Smoking status: Every Day    Current packs/day: 0.50    Average packs/day: 0.5 packs/day for 1 year (0.5 ttl pk-yrs)    Types: Cigarettes   Smokeless tobacco: Never  Substance Use Topics   Alcohol use: Not Currently    Comment: noen sinc e2019    Family History  Problem Relation Age of Onset   Alcohol abuse Mother 67       MVA   Diabetes Mother    Heart disease Father 53       MI   Heart disease Brother    Diabetes Brother    Arthritis Sister    Dementia Maternal Grandfather      Objective:   Patient Vitals for the past 8 hrs:  BP Temp Temp src Pulse Resp SpO2 Height Weight  01/19/25 0900 (!) 106/27 (!) 95.7 F (35.4 C) -- (!) 53 17 92 % -- --  01/19/25  0800 (!) 110/42 (!) 95.5 F (35.3 C) -- (!) 46 16 94 % -- --  01/19/25 0700 (!) 119/43 (!) 95.4 F (35.2 C) -- (!) 49 19 92 % -- --  01/19/25 0620 -- (!) 95.4 F (35.2 C) -- -- -- -- -- --  01/19/25 0612 -- -- -- (!) 56 (!) 22 92 % -- --  01/19/25 0603 (!) 128/42 -- -- 60 (!) 22 ROLLEN)  84 % -- --  01/19/25 0530 (!) 130/48 (!) 94.1 F (34.5 C) -- (!) 54 17 95 % -- --  01/19/25 0500 (!) 131/48 (!) 93.1 F (33.9 C) -- (!) 50 18 96 % -- --  01/19/25 0330 (!) 136/47 (!) 90.2 F (32.3 C) -- (!) 43 16 97 % -- --  01/19/25 0210 (!) 133/46 (!) 88.5 F (31.4 C) Rectal (!) 36 15 94 % -- --  01/19/25 0208 -- -- -- -- -- -- 5' 2 (1.575 m) 77.7 kg   I/O last 3 completed shifts: In: 1265 [IV Piggyback:1265] Out: 1025 [Urine:1025] Total I/O In: 187.4 [IV Piggyback:187.4] Out: 250 [Urine:250]  CT HEAD WO CONTRAST ( ) Result Date: 01/19/2025 EXAM: CT HEAD WITHOUT CONTRAST 01/19/2025 09:47:00 AM TECHNIQUE: CT of the head was performed without the administration of intravenous contrast. Automated exposure control, iterative reconstruction, and/or weight based adjustment of the mA/kV was utilized to reduce the radiation dose to as low as reasonably achievable. COMPARISON: CTA head and neck this morning, CT head 01/19/2025 02:58 AM. CLINICAL HISTORY: 79 year old female. Status post fall with acute intracranial hemorrhage. FINDINGS: BRAIN AND VENTRICLES: Thick and hyperdense parafalcine extra-axial hemorrhage, appearing as a combination of subdural and subarachnoid blood, measures up to 18 mm thickness in the midline (coronal image 26), stable. Superimposed more mixed and isodense right-sided subdural hematoma is estimated at 4 to 5 mm thickness on coronal images (coronal image 42). Asymmetric left tentorial hyperdense subdural blood measures up to 6 mm. Small volume right greater than left additional scattered subarachnoid hemorrhage. No significant midline shift although there is mild asymmetric mass effect on  the right lateral ventricle (series 2 image 17). No parenchymal contusion or intraaxial blood is identified. No cerebral edema. Stable gray white differentiation. Basilar cisterns remain normal. No hydrocephalus. ORBITS: No acute abnormality. SINUSES: Well aerated paranasal sinuses, tympanic cavities, and mastoids. SOFT TISSUES AND SKULL: No acute soft tissue abnormality. No skull fracture. IMPRESSION: 1. Stable thick parafalcine hemorrhage (18 mm). More iso-intense peripheral right SDH is estimated at 5 mm. And stable similar left tentorial SDH. Stable scattered SAH. 2. Mild right lateral ventricle mass effect with No significant midline shift. 3. No new intracranial abnormality. Electronically signed by: Helayne Hurst MD 01/19/2025 10:00 AM EST RP Workstation: [id number]   CT Angio Head W or Wo Contrast Result Date: 01/19/2025 EXAM: CTA Head without and with Intravenous Contrast CLINICAL HISTORY: 78y 41m F. Subarachnoid hemorrhage [facility]), status post fall with acute intracranial hemorrhage. TECHNIQUE: Axial CTA images of the head without and with intravenous contrast. MIP reconstructed images were created and reviewed. Dose reduction technique was used including one or more of the following: automated exposure control, adjustment of mA and kV according to patient size, and/or iterative reconstruction. CONTRAST: Without and with; 75mL (iohexol  (OMNIPAQUE ) 350 MG/ML injection 75 mL IOHEXOL  350 MG/ML SOLN) COMPARISON: CT head 01/19/2025 04:44:41 AM, brain MRI 11/01/2008. FINDINGS: INTERNAL CAROTID ARTERIES: Visible cervical ICAs are patent with no significant atherosclerosis or stenosis. Mild ICA siphon tortuosity and mild siphon calcified plaque with no ICA siphon stenosis. Normal ophthalmic and posterior communicating artery origins. No aneurysm. ANTERIOR CEREBRAL ARTERIES: Normal anterior communicating artery. Normal ACA origins. No significant stenosis. No occlusion. No aneurysm. MIDDLE CEREBRAL ARTERIES: Normal  MCA origins. No significant stenosis. No occlusion. No aneurysm. POSTERIOR CEREBRAL ARTERIES: Small posterior communicating arteries, the left is larger. Normal PCA origins. No significant stenosis. No occlusion. No aneurysm. BASILAR ARTERY: Patent vertebrobasilar junction with no significant atherosclerosis. No significant stenosis. No  occlusion. No aneurysm. VERTEBRAL ARTERIES: Patent distal vertebral arteries with no significant atherosclerosis. No significant stenosis. No occlusion. No aneurysm. Left vertebral artery appears mildly dominant. Normal PICA origins. INTRACRANIAL VENOUS: Major dural venous sinuses are enhancing and patent. GENERAL: No contrast extravasation or CTA spot sign identified in association with the intracranial hemorrhage better demonstrated on non-contrast CT. No significant midline shift or ventriculomegaly. BONES: Severe upper cervical spine degeneration at C1-C2 with evidence of C2-C3 degenerative ankylosis. No skull fracture identified. IMPRESSION: 1. No intracranial aneurysm, CTA spot sign, or contrast extravasation associated with the acute intracranial hemorrhage. 2. No hemodynamically significant intracranial artery stenosis. Electronically signed by: Helayne Hurst MD 01/19/2025 05:46 AM EST RP Workstation: [id number]   CT Head Wo Contrast Result Date: 01/19/2025 EXAM: CT HEAD WITHOUT CONTRAST 01/19/2025 03:10:23 AM TECHNIQUE: CT of the head was performed without the administration of intravenous contrast. Automated exposure control, iterative reconstruction, and/or weight based adjustment of the mA/kV was utilized to reduce the radiation dose to as low as reasonably achievable. COMPARISON: 12/28/2023 CLINICAL HISTORY: Head trauma, minor (age >= 65 years). FINDINGS: BRAIN AND VENTRICLES: Acute subdural hemorrhage along the right frontal lobe and along the anterior falx measuring up to 1.7 cm in thickness. There is also acute subdural blood along the left tentorial leaflet. Partial  effacement of the right lateral ventricle. No evidence of acute infarct. No hydrocephalus. No extra-axial collection. No mass effect. ORBITS: No acute abnormality. SINUSES: No acute abnormality. SOFT TISSUES AND SKULL: No acute soft tissue abnormality. Traumatic brain injury risk stratification: Subdural hematoma (SDH): >8 mm (MBIG 3). Subarachnoid hemorrhage [employer]): None (MBIG 1). Epidural hematoma (EDH): No (MBIG 1). Cerebral contusion, intra-axial, intraparenchymal hemorrhage (IPH): None (MBIG 1). Intraventricular hemorrhage (IVH): No (MBIG 1). Midline shift > 1mm or edema/effacement of sulci/vents: no Skull fracture: none (MBIG 1). IMPRESSION: 1. Acute subdural hemorrhage along the right frontal lobe and anterior falx measuring up to 1.7 cm in thickness, also involving the left tentorial leaflet. 2. Findings discussed with Dr. Selinda Sias at 3:23 AM on 01/19/25. Electronically signed by: Franky Stanford MD 01/19/2025 03:24 AM EST RP Workstation: [id number]   DG Chest Portable 1 View Result Date: 01/19/2025 EXAM: 1 VIEW(S) XRAY OF THE CHEST 01/19/2025 02:26:00 AM COMPARISON: 11/02/2024 CLINICAL HISTORY: Evaluation for rales. FINDINGS: LUNGS AND PLEURA: Pulmonary interstitial prominence with mild pulmonary edema. Persistent linear opacities at left base. No pleural effusion. No pneumothorax. HEART AND MEDIASTINUM: Cardiomegaly. Calcified aorta. BONES AND SOFT TISSUES: No acute osseous abnormality. IMPRESSION: 1. Mild pulmonary edema with persistent left basilar subsegmental atelectasis or scarring. 2. Cardiomegaly and aortic atherosclerosis. Electronically signed by: Greig Pique MD 01/19/2025 02:32 AM EST RP Workstation: [id number]    Awake, alert Speech limited, answers simple questions.  CN grossly intact Strength grossly intact BUE/BLE FCx4  Assessment:   Principal Problem:   SDH (subdural hematoma) (HCC)  This is a 79yo female with acute to subacute R frontal SDH and large acute appearing falcine  SDH. Stable on repeat CTH, CTA w/o acute findings.   Plan:   -Continue Keppra  -Continue BP control -Supportive care per primary -Will need f/u CTH in 4 weeks.   Timi Reeser CAYLIN Dashel Goines, PA-C       [1]  Allergies Allergen Reactions   Losartan  Shortness Of Breath   Benazepril  Cough   Lasix  [Furosemide ] Other (See Comments)    Rash, changes in eyesight. Currently taking this at home per pt.    Levaquin [Levofloxacin] Other (See Comments)    Tendon and muscle pain  Phenobarbital Nausea And Vomiting   "

## 2025-01-19 NOTE — Evaluation (Signed)
 Clinical/Bedside Swallow Evaluation Patient Details  Name: MAXIMINA PIROZZI MRN: [id number] Date of Birth: 1946/09/01  Today's Date: 01/19/2025 Time: SLP Start Time (ACUTE ONLY): 1120 SLP Stop Time (ACUTE ONLY): 1130 SLP Time Calculation (min) (ACUTE ONLY): 10 min  Past Medical History:  Past Medical History:  Diagnosis Date   Allergic rhinitis    Anxiety    Arthritis    sees Dr. Jon Jacob    Cellulitis, periorbital 2006   left eye, due to MRSA, saw Dr. Arlana    Chronic kidney disease    sees Dr. Curtis Heman   Complication of anesthesia    COPD (chronic obstructive pulmonary disease) (HCC)    Cough    Diabetes mellitus    Dyspnea    with exertion   GERD (gastroesophageal reflux disease)    OTC  Pepcid    Hemorrhoids    Hyperlipidemia    Hypertension    IBS (irritable bowel syndrome)    Loss of smell    Loss of taste    Memory changes    Menopause    MRSA (methicillin resistant Staphylococcus aureus) 2006   left orbit    Pneumonia    PONV (postoperative nausea and vomiting)    Tubular adenoma of colon 12/2006   Past Surgical History:  Past Surgical History:  Procedure Laterality Date   APPENDECTOMY     BACK SURGERY     COLONOSCOPY  04-01-12   per Dr. Aneita, tubular adenoma, repeat in 5 yrs    CYSTOSCOPY WITH BIOPSY N/A 12/11/2021   Procedure: CYSTOSCOPY WITH BIOPSY;  Surgeon: Elisabeth Valli BIRCH, MD;  Location: [facility];  Service: Urology;  Laterality: N/A;   CYSTOSCOPY WITH FULGERATION N/A 12/11/2021   Procedure: CYSTOSCOPY WITH FULGERATION;  Surgeon: Elisabeth Valli BIRCH, MD;  Location: [facility];  Service: Urology;  Laterality: N/A;   POLYPECTOMY     TUBAL LIGATION     HPI:  Niva Murren is a 79 y.o. female who was admitted to ED from home 2/4 after recent falls, deterioration in speech.  Dx acute R frontal SDH, large falcine SDH. Admitted in Nov 2025 for hypothermia, hypoglycemia and has not returned to baseline after admission. PMH includes PNA, MRSA 2006,  HTN, HLD, GERD, T2DM, dyspnea, COPD, CKD.    Assessment / Plan / Recommendation  Clinical Impression  PLAN: Pt will need cortrak for nutrition due to her cognitive deficits (poor initiation, oral holding of all POs).  Supplement with full liquid diet to allow opportunities to eat/drink.    Pt alert, followed occasional one-step commands, often after significant delay.  Pt consistently held POs in mouth, requiring verbal cues to swallow (pudding, thin liquids). There were no s/s of aspiration. No focal CN deficits.  SLP will follow for diet advancement as MS allows. D/W APP and RN.   SLP Visit Diagnosis: Dysphagia, unspecified (R13.10)    Aspiration Risk       Diet Recommendation   Other (Comment) (full liquids)  Medication Administration: Via alternative means    Other Recommendations Oral Care Recommendations: Oral care BID     Swallow Evaluation Recommendations     Assistance Recommended at Discharge    Functional Status Assessment Patient has had a recent decline in their functional status and demonstrates the ability to make significant improvements in function in a reasonable and predictable amount of time.  Frequency and Duration min 2x/week  2 weeks       Prognosis Prognosis for improved oropharyngeal function: Good Barriers to Reach  Goals: Cognitive deficits      Swallow Study   General Date of Onset: 01/19/25 HPI: Georgianne Gritz is a 79 y.o. female who was admitted to ED from home 2/4 after recent falls, deterioration in speech.  Dx acute R frontal SDH, large falcine SDH. Admitted in Nov 2025 for hypothermia, hypoglycemia and has not returned to baseline after admission. PMH includes PNA, MRSA 2006, HTN, HLD, GERD, T2DM, dyspnea, COPD, CKD. Type of Study: Bedside Swallow Evaluation Previous Swallow Assessment: 12/2023 normal findings at bedside Diet Prior to this Study: NPO Temperature Spikes Noted: No Respiratory Status: Nasal cannula History of Recent Intubation:  No Behavior/Cognition: Alert Oral Cavity Assessment: Within Functional Limits Oral Care Completed by SLP: Recent completion by staff Oral Cavity - Dentition: Adequate natural dentition Self-Feeding Abilities: Needs assist Patient Positioning: Upright in bed Baseline Vocal Quality: Not observed Volitional Cough: Cognitively unable to elicit Volitional Swallow: Able to elicit    Oral/Motor/Sensory Function Overall Oral Motor/Sensory Function: Within functional limits   Ice Chips Ice chips: Within functional limits   Thin Liquid Thin Liquid: Impaired Presentation: Spoon;Straw Oral Phase Functional Implications: Oral holding    Nectar Thick Nectar Thick Liquid: Not tested   Honey Thick Honey Thick Liquid: Not tested   Puree Puree: Impaired Presentation: Spoon Oral Phase Functional Implications: Oral holding   Solid     Solid: Not tested     Yuli Lanigan L. Vona, MA CCC/SLP Clinical Specialist - Acute Care SLP Acute Rehabilitation Services Office number [phone]  [name] 01/19/2025,11:55 AM

## 2025-01-20 ENCOUNTER — Inpatient Hospital Stay (HOSPITAL_COMMUNITY)

## 2025-01-20 DIAGNOSIS — I130 Hypertensive heart and chronic kidney disease with heart failure and stage 1 through stage 4 chronic kidney disease, or unspecified chronic kidney disease: Secondary | ICD-10-CM | POA: Diagnosis not present

## 2025-01-20 DIAGNOSIS — J9621 Acute and chronic respiratory failure with hypoxia: Secondary | ICD-10-CM | POA: Diagnosis not present

## 2025-01-20 DIAGNOSIS — J449 Chronic obstructive pulmonary disease, unspecified: Secondary | ICD-10-CM | POA: Diagnosis not present

## 2025-01-20 DIAGNOSIS — N1831 Chronic kidney disease, stage 3a: Secondary | ICD-10-CM | POA: Diagnosis not present

## 2025-01-20 DIAGNOSIS — N179 Acute kidney failure, unspecified: Secondary | ICD-10-CM

## 2025-01-20 DIAGNOSIS — W1830XA Fall on same level, unspecified, initial encounter: Secondary | ICD-10-CM | POA: Diagnosis not present

## 2025-01-20 DIAGNOSIS — D649 Anemia, unspecified: Secondary | ICD-10-CM | POA: Diagnosis not present

## 2025-01-20 DIAGNOSIS — D696 Thrombocytopenia, unspecified: Secondary | ICD-10-CM | POA: Diagnosis not present

## 2025-01-20 DIAGNOSIS — G9340 Encephalopathy, unspecified: Secondary | ICD-10-CM | POA: Diagnosis not present

## 2025-01-20 DIAGNOSIS — J9622 Acute and chronic respiratory failure with hypercapnia: Secondary | ICD-10-CM | POA: Diagnosis not present

## 2025-01-20 DIAGNOSIS — S065XAA Traumatic subdural hemorrhage with loss of consciousness status unknown, initial encounter: Secondary | ICD-10-CM | POA: Diagnosis not present

## 2025-01-20 DIAGNOSIS — J9601 Acute respiratory failure with hypoxia: Secondary | ICD-10-CM

## 2025-01-20 DIAGNOSIS — R569 Unspecified convulsions: Secondary | ICD-10-CM

## 2025-01-20 DIAGNOSIS — I5032 Chronic diastolic (congestive) heart failure: Secondary | ICD-10-CM

## 2025-01-20 LAB — BASIC METABOLIC PANEL WITH GFR
Anion gap: 11 (ref 5–15)
BUN: 37 mg/dL — ABNORMAL HIGH (ref 8–23)
CO2: 26 mmol/L (ref 22–32)
Calcium: 9.6 mg/dL (ref 8.9–10.3)
Chloride: 104 mmol/L (ref 98–111)
Creatinine, Ser: 1.07 mg/dL — ABNORMAL HIGH (ref 0.44–1.00)
GFR, Estimated: 53 mL/min — ABNORMAL LOW
Glucose, Bld: 149 mg/dL — ABNORMAL HIGH (ref 70–99)
Potassium: 3.9 mmol/L (ref 3.5–5.1)
Sodium: 141 mmol/L (ref 135–145)

## 2025-01-20 LAB — T3: T3, Total: 79 ng/dL (ref 71–180)

## 2025-01-20 LAB — POCT I-STAT 7, (LYTES, BLD GAS, ICA,H+H)
Acid-Base Excess: 3 mmol/L — ABNORMAL HIGH (ref 0.0–2.0)
Bicarbonate: 28 mmol/L (ref 20.0–28.0)
Calcium, Ion: 1.25 mmol/L (ref 1.15–1.40)
HCT: 27 % — ABNORMAL LOW (ref 36.0–46.0)
Hemoglobin: 9.2 g/dL — ABNORMAL LOW (ref 12.0–15.0)
O2 Saturation: 100 %
Patient temperature: 98.8
Potassium: 3.4 mmol/L — ABNORMAL LOW (ref 3.5–5.1)
Sodium: 141 mmol/L (ref 135–145)
TCO2: 29 mmol/L (ref 22–32)
pCO2 arterial: 44.8 mmHg (ref 32–48)
pH, Arterial: 7.405 (ref 7.35–7.45)
pO2, Arterial: 349 mmHg — ABNORMAL HIGH (ref 83–108)

## 2025-01-20 LAB — CBC
HCT: 25.6 % — ABNORMAL LOW (ref 36.0–46.0)
Hemoglobin: 8.7 g/dL — ABNORMAL LOW (ref 12.0–15.0)
MCH: 30.1 pg (ref 26.0–34.0)
MCHC: 34 g/dL (ref 30.0–36.0)
MCV: 88.6 fL (ref 80.0–100.0)
Platelets: 143 10*3/uL — ABNORMAL LOW (ref 150–400)
RBC: 2.89 MIL/uL — ABNORMAL LOW (ref 3.87–5.11)
RDW: 17.2 % — ABNORMAL HIGH (ref 11.5–15.5)
WBC: 10.2 10*3/uL (ref 4.0–10.5)
nRBC: 0.2 % (ref 0.0–0.2)

## 2025-01-20 LAB — PHOSPHORUS: Phosphorus: 2.6 mg/dL (ref 2.5–4.6)

## 2025-01-20 LAB — T3, FREE: T3, Free: 2.3 pg/mL (ref 2.0–4.4)

## 2025-01-20 LAB — GLUCOSE, CAPILLARY
Glucose-Capillary: 114 mg/dL — ABNORMAL HIGH (ref 70–99)
Glucose-Capillary: 126 mg/dL — ABNORMAL HIGH (ref 70–99)
Glucose-Capillary: 141 mg/dL — ABNORMAL HIGH (ref 70–99)
Glucose-Capillary: 142 mg/dL — ABNORMAL HIGH (ref 70–99)
Glucose-Capillary: 170 mg/dL — ABNORMAL HIGH (ref 70–99)
Glucose-Capillary: 97 mg/dL (ref 70–99)

## 2025-01-20 LAB — MAGNESIUM: Magnesium: 2.1 mg/dL (ref 1.7–2.4)

## 2025-01-20 MED ORDER — FENTANYL CITRATE (PF) 50 MCG/ML IJ SOSY
PREFILLED_SYRINGE | INTRAMUSCULAR | Status: AC
  Filled 2025-01-20: qty 2

## 2025-01-20 MED ORDER — ETOMIDATE 2 MG/ML IV SOLN
INTRAVENOUS | Status: AC
  Administered 2025-01-20: 20 mg
  Filled 2025-01-20: qty 20

## 2025-01-20 MED ORDER — ONDANSETRON HCL 4 MG/2ML IJ SOLN: 4.0000 mg | Freq: Four times a day (QID) | INTRAMUSCULAR | Status: AC | PRN

## 2025-01-20 MED ORDER — LACTULOSE 10 GM/15ML PO SOLN
20.0000 g | ORAL | Status: AC
  Administered 2025-01-20 (×5): 20 g
  Filled 2025-01-20 (×5): qty 30

## 2025-01-20 MED ORDER — FAMOTIDINE 20 MG PO TABS
20.0000 mg | ORAL_TABLET | Freq: Every day | ORAL | Status: AC
  Administered 2025-01-20 – 2025-01-21 (×2): 20 mg
  Filled 2025-01-20 (×2): qty 1

## 2025-01-20 MED ORDER — FENTANYL CITRATE (PF) 50 MCG/ML IJ SOSY: 50.0000 ug | PREFILLED_SYRINGE | Freq: Once | INTRAMUSCULAR | Status: DC

## 2025-01-20 MED ORDER — INSULIN ASPART 100 UNIT/ML IJ SOLN
0.0000 [IU] | INTRAMUSCULAR | Status: AC
  Administered 2025-01-20: 1 [IU] via SUBCUTANEOUS
  Filled 2025-01-20: qty 1

## 2025-01-20 MED ORDER — FENTANYL BOLUS VIA INFUSION
25.0000 ug | INTRAVENOUS | Status: AC | PRN
  Administered 2025-01-20: 25 ug via INTRAVENOUS
  Administered 2025-01-21 (×3): 100 ug via INTRAVENOUS
  Administered 2025-01-21: 25 ug via INTRAVENOUS

## 2025-01-20 MED ORDER — KETAMINE HCL 50 MG/5ML IJ SOSY
PREFILLED_SYRINGE | INTRAMUSCULAR | Status: AC
  Filled 2025-01-20: qty 10

## 2025-01-20 MED ORDER — FENTANYL 2500MCG IN NS 250ML (10MCG/ML) PREMIX INFUSION
0.0000 ug/h | INTRAVENOUS | Status: AC
  Administered 2025-01-20: 25 ug/h via INTRAVENOUS
  Filled 2025-01-20: qty 250

## 2025-01-20 MED ORDER — FREE WATER: 200.0000 mL | Freq: Four times a day (QID) | Status: DC

## 2025-01-20 MED ORDER — OSMOLITE 1.5 CAL PO LIQD
1000.0000 mL | ORAL | Status: DC
  Administered 2025-01-20 – 2025-01-21 (×2): 1000 mL

## 2025-01-20 MED ORDER — SODIUM CHLORIDE 0.9 % IV SOLN: INTRAVENOUS | Status: AC | PRN

## 2025-01-20 MED ORDER — PROPOFOL 1000 MG/100ML IV EMUL
INTRAVENOUS | Status: AC
  Administered 2025-01-20: 5 ug/kg/min via INTRAVENOUS
  Filled 2025-01-20: qty 100

## 2025-01-20 MED ORDER — FENTANYL CITRATE (PF) 50 MCG/ML IJ SOSY: 25.0000 ug | PREFILLED_SYRINGE | Freq: Once | INTRAMUSCULAR | Status: AC

## 2025-01-20 MED ORDER — FUROSEMIDE 10 MG/ML IJ SOLN
60.0000 mg | Freq: Once | INTRAMUSCULAR | Status: AC
  Administered 2025-01-20: 60 mg via INTRAVENOUS
  Filled 2025-01-20: qty 6

## 2025-01-20 MED ORDER — PROPOFOL 1000 MG/100ML IV EMUL
0.0000 ug/kg/min | INTRAVENOUS | Status: AC
  Administered 2025-01-21 (×2): 5 ug/kg/min via INTRAVENOUS
  Filled 2025-01-20 (×2): qty 100

## 2025-01-20 MED ORDER — ROCURONIUM BROMIDE 10 MG/ML (PF) SYRINGE
PREFILLED_SYRINGE | INTRAVENOUS | Status: AC
  Filled 2025-01-20: qty 10

## 2025-01-20 MED ORDER — ETOMIDATE 2 MG/ML IV SOLN: 20.0000 mg | Freq: Once | INTRAVENOUS | Status: AC

## 2025-01-20 MED ORDER — ROCURONIUM BROMIDE 10 MG/ML (PF) SYRINGE
70.0000 mg | PREFILLED_SYRINGE | Freq: Once | INTRAVENOUS | Status: AC
  Administered 2025-01-20: 70 mg via INTRAVENOUS

## 2025-01-20 MED ORDER — POTASSIUM CHLORIDE 20 MEQ PO PACK
40.0000 meq | PACK | Freq: Once | ORAL | Status: AC
  Administered 2025-01-20: 40 meq

## 2025-01-20 MED ORDER — SODIUM CHLORIDE 0.9 % IV SOLN
1.0000 g | INTRAVENOUS | Status: DC
  Filled 2025-01-20: qty 10

## 2025-01-20 MED ORDER — ORAL CARE MOUTH RINSE
15.0000 mL | OROMUCOSAL | Status: AC
  Administered 2025-01-20 – 2025-01-21 (×20): 15 mL via OROMUCOSAL

## 2025-01-20 MED ORDER — SODIUM CHLORIDE 0.9 % IV SOLN
3.0000 g | Freq: Four times a day (QID) | INTRAVENOUS | Status: DC
  Administered 2025-01-20 – 2025-01-21 (×5): 3 g via INTRAVENOUS
  Filled 2025-01-20 (×5): qty 8

## 2025-01-20 MED ORDER — SUCCINYLCHOLINE CHLORIDE 200 MG/10ML IV SOSY
PREFILLED_SYRINGE | INTRAVENOUS | Status: AC
  Filled 2025-01-20: qty 10

## 2025-01-20 MED ORDER — ORAL CARE MOUTH RINSE: 15.0000 mL | OROMUCOSAL | Status: AC | PRN

## 2025-01-20 NOTE — Procedures (Addendum)
 Patient Name: SHYNICE SIGEL  MRN: [id number]  Epilepsy Attending: Arlin MALVA Krebs  Referring Physician/Provider: Ruthine Cory, PA-C  Duration: 01/19/2025 1615 to 01/20/2025 1615  Patient history: 48F with DM2 and prior hospitalization in Nov for hypoglycemia, bradycardia and hypothermia who presents with confusion after recent falls fall last night. CT head with R frontal and large falcine SDH. EEG to evaluate for seizure  Level of alertness: Awake/ lethargic   AEDs during EEG study: LEV  Technical aspects: This EEG study was done with scalp electrodes positioned according to the 10-20 International system of electrode placement. Electrical activity was reviewed with band pass filter of 1-70Hz , sensitivity of 7 uV/mm, display speed of 34mm/sec with a 60Hz  notched filter applied as appropriate. EEG data were recorded continuously and digitally stored.  Video monitoring was available and reviewed as appropriate.  Description: EEG showed continuous generalized and lateralized right hemisphere 3-6hz  theta- delta slowing, at times with triphasic morphology and quasi periodic at 1.5 to 2 Hz.  After around 830 on 01/20/2025, EEG evolved into continuous generalized and lateralized right hemisphere low amplitude 3-6hz  theta- delta slowing admixed with 12 to 15 Hz with activity.  Hyperventilation and photic stimulation were not performed.     ABNORMALITY - Continuous slow, generalized and lateralized right hemisphere  IMPRESSION: This study is suggestive of cortical dysfunction in right hemisphere likely secondary to underlying structural abnormality.  Additionally there is generalized cerebral dysfunction (encephalopathy).  No seizures or definite epileptiform discharges were seen throughout the recording.  Hadi Dubin O Martyna Thorns

## 2025-01-20 NOTE — Progress Notes (Signed)
Patient was transported to CT & back to 4N32 without any complications.

## 2025-01-20 NOTE — Progress Notes (Signed)
 Earlier today noted a slight pink tinge in the patients urine. Upon re-evaluation, more apparent blood noted in the foley tubing. CCM  made aware. Continuing to monitor as urine output has remained adequate.

## 2025-01-20 NOTE — Progress Notes (Signed)
 eLink Physician-Brief Progress Note Patient Name: Deborah Bryant DOB: November 24, 1946 MRN: [id number]   Date of Service  01/20/2025  HPI/Events of Note  Patient with difficulty clearing secretions and concern for aspiration. Saturation 95 % currently on the monitor.  eICU Interventions  PRN NT suction order, CXR to r/o aspiration.        [name] 01/20/2025, 1:55 AM

## 2025-01-20 NOTE — Telephone Encounter (Signed)
 Pt form was successfully refax again to [employer] this morning

## 2025-01-20 NOTE — TOC CAGE-AID Note (Signed)
 Transition of Care [facility]) - CAGE-AID Screening  Patient Details  Name: Deborah Bryant MRN: [id number] Date of Birth: 04/30/1946  Clinical Narrative:  Patient has been disoriented, required intubation this AM. Patient unable to participate in screening at this time.  CAGE-AID Screening: Substance Abuse Screening unable to be completed due to: : Patient unable to participate

## 2025-01-20 NOTE — Progress Notes (Signed)
MB performed maintenance on electrodes. All impedances are below 10k ohms. No skin breakdown noted.  

## 2025-01-20 NOTE — Progress Notes (Signed)
 LTM maint complete - no skin breakdown seen

## 2025-01-20 NOTE — Procedures (Signed)
 Intubation Procedure Note  Deborah Bryant  [id number]  1946/12/09  Date:01/20/25  Time:8:45 AM   Provider Performing:Kainan Patty Ruthine    Procedure: Intubation (31500)  Indication(s) Respiratory Failure  Consent Risks of the procedure as well as the alternatives and risks of each were explained to the patient and/or caregiver.  Consent for the procedure was obtained and is signed in the bedside chart   Anesthesia Etomidate  and Rocuronium    Time Out Verified patient identification, verified procedure, site/side was marked, verified correct patient position, special equipment/implants available, medications/allergies/relevant history reviewed, required imaging and test results available.   Sterile Technique Usual hand hygeine, masks, and gloves were used   Procedure Description Patient positioned in bed supine.  Sedation given as noted above.  Patient was intubated with endotracheal tube using Glidescope.  View was Grade 1 full glottis .  Number of attempts was 1.  Colorimetric CO2 detector was consistent with tracheal placement.   Complications/Tolerance None; patient tolerated the procedure well. Chest X-ray is ordered to verify placement.   EBL none   Specimen(s) None   Dr. Harold at bedside for procedure.  Iliya Spivack, PA-C [facility] For pager details, please see AMION or use Epic chat  After 1900, please call [facility] for cross coverage needs 01/20/2025, 8:45 AM

## 2025-01-20 NOTE — Progress Notes (Signed)
 Subjective: Patient has had slow neurological decline  Objective: Vital signs in last 24 hours: Temp:  [95.2 F (35.1 C)-99.3 F (37.4 C)] 99.3 F (37.4 C) (02/05 0700) Pulse Rate:  [46-71] 67 (02/05 0700) Resp:  [16-31] 28 (02/05 0700) BP: (106-156)/(27-64) 143/58 (02/05 0700) SpO2:  [90 %-96 %] 92 % (02/05 0700) FiO2 (%):  [32 %-36 %] 36 % (02/04 2100) Weight:  [79.3 kg] 79.3 kg (02/05 0400)  Intake/Output from previous day: 02/04 0701 - 02/05 0700 In: 2292.7 [I.V.:1542; NG/GT:553.3; IV Piggyback:197.4] Out: 2355 [Urine:2355] Intake/Output this shift: No intake/output data recorded.  On nasal cannula oxygen , eyes open to stand Localizes bilaterally  Lab Results: Recent Labs    01/19/25 0216 01/19/25 0222 01/20/25 0529  WBC 9.6  --  10.2  HGB 10.1* 10.9* 8.7*  HCT 30.2* 32.0* 25.6*  PLT 127*  --  143*   BMET Recent Labs    01/19/25 0216 01/19/25 0222 01/20/25 0529  NA 133* 132* 141  K 4.7 4.4 3.9  CL 95*  --  104  CO2 25  --  26  GLUCOSE 109*  --  149*  BUN 58*  --  37*  CREATININE 1.30*  --  1.07*  CALCIUM  9.7  --  9.6    Studies/Results: DG Chest Port 1 View Result Date: 01/20/2025 EXAM: 1 VIEW(S) XRAY OF THE CHEST 01/20/2025 02:07:16 AM COMPARISON: 01/19/2025 CLINICAL HISTORY: ICD10: 5626 Acute respiratory failure (HCC); 345945 Aspiration into lower respiratory tract. FINDINGS: LINES, TUBES AND DEVICES: Weighted enteric tube in place with tip below the diaphragm. LUNGS AND PLEURA: Decreased pulmonary edema from prior exam. Decreased atelectasis and left lung base opacity. Small bilateral pleural effusions suspected. No pneumothorax. HEART AND MEDIASTINUM: Stable cardiomegaly. No acute abnormality of the mediastinal silhouette. BONES AND SOFT TISSUES: No acute osseous abnormality. IMPRESSION: 1. Decreased pulmonary edema and atelectasis compared to prior exam. 2. Small bilateral pleural effusions suspected. 3. Weighted enteric tube tip projects below the  diaphragm. Electronically signed by: Greig Pique MD 01/20/2025 02:14 AM EST RP Workstation: [id number]   DG Shoulder 1V Left Result Date: 01/19/2025 EXAM: 1 VIEW(S) XRAY OF THE LEFT SHOULDER 01/19/2025 04:08:29 PM COMPARISON: 3 / 6 / 12 CLINICAL HISTORY: Pain. FINDINGS: BONES AND JOINTS: Glenohumeral joint is normally aligned. No acute fracture. No malalignment. Calcific density along the lateral humeral head. Mild degenerative changes of the glenohumeral joint. The [facility] joint is unremarkable. SOFT TISSUES: Cardiac monitoring leads in place. No abnormal calcifications. Visualized lung is unremarkable. IMPRESSION: 1. No acute abnormality on single AP view of the shoulder. 2. Calcific density along the lateral humeral head, which can be seen with calcific tendinitis. Electronically signed by: Norman Gatlin MD 01/19/2025 07:41 PM EST RP Workstation: [id number]   ECHOCARDIOGRAM COMPLETE Result Date: 01/19/2025    ECHOCARDIOGRAM REPORT   Patient Name:   DOVIE KAPUSTA Date of Exam: 01/19/2025 Medical Rec #:  [id number]      Height:       62.0 in Accession #:    [id number]     Weight:       171.3 lb Date of Birth:  [birth year]       BSA:          1.790 m Patient Age:    78 years       BP:           134/55 mmHg Patient Gender: F              HR:  55 bpm. Exam Location:  Inpatient Procedure: 2D Echo, Cardiac Doppler and Color Doppler (Both Spectral and Color            Flow Doppler were utilized during procedure). Indications:    CHF - Acute Diastolic  History:        Patient has prior history of Echocardiogram examinations, most                 recent 12/29/2023. CHF, Arrythmias:Bradycardia,                 Signs/Symptoms:Edema; Risk Factors:Diabetes, Dyslipidemia,                 Current Smoker and Hypertension.  Sonographer:    Juliene Rucks Referring Phys: [id number] JESSICA MARSHALL  Sonographer Comments: Patient is obese. IMPRESSIONS  1. Left ventricular ejection fraction, by estimation, is 50 to 55%. The left  ventricle has low normal function. The left ventricle demonstrates global hypokinesis. There is mild concentric left ventricular hypertrophy. Left ventricular diastolic parameters were normal.  2. Right ventricular systolic function is normal. The right ventricular size is normal. Tricuspid regurgitation signal is inadequate for assessing PA pressure.  3. Left atrial size was moderately dilated.  4. The mitral valve is normal in structure. Mild mitral valve regurgitation. No evidence of mitral stenosis.  5. The aortic valve is normal in structure. Aortic valve regurgitation is not visualized. No aortic stenosis is present.  6. The inferior vena cava is normal in size with greater than 50% respiratory variability, suggesting right atrial pressure of 3 mmHg. FINDINGS  Left Ventricle: Left ventricular ejection fraction, by estimation, is 50 to 55%. The left ventricle has low normal function. The left ventricle demonstrates global hypokinesis. The left ventricular internal cavity size was normal in size. There is mild concentric left ventricular hypertrophy. Left ventricular diastolic parameters were normal. Right Ventricle: The right ventricular size is normal. No increase in right ventricular wall thickness. Right ventricular systolic function is normal. Tricuspid regurgitation signal is inadequate for assessing PA pressure. Left [facility]: Left atrial size was moderately dilated. Right [facility]: Right atrial size was normal in size. Pericardium: There is no evidence of pericardial effusion. Presence of epicardial fat layer. Mitral Valve: The mitral valve is normal in structure. Mild mitral valve regurgitation. No evidence of mitral valve stenosis. Tricuspid Valve: The tricuspid valve is normal in structure. Tricuspid valve regurgitation is not demonstrated. No evidence of tricuspid stenosis. Aortic Valve: The aortic valve is normal in structure. Aortic valve regurgitation is not visualized. No aortic stenosis is present.  Aortic valve mean gradient measures 7.0 mmHg. Aortic valve peak gradient measures 12.1 mmHg. Aortic valve area, by VTI measures 1.95 cm. Pulmonic Valve: The pulmonic valve was normal in structure. Pulmonic valve regurgitation is not visualized. No evidence of pulmonic stenosis. Aorta: The aortic root and ascending aorta are structurally normal, with no evidence of dilitation. Venous: The inferior vena cava is normal in size with greater than 50% respiratory variability, suggesting right atrial pressure of 3 mmHg. IAS/Shunts: No atrial level shunt detected by color flow Doppler.  LEFT VENTRICLE PLAX 2D LVIDd:         4.90 cm      Diastology LVIDs:         3.50 cm      LV e' medial:    6.01 cm/s LV PW:         0.90 cm      LV E/e' medial:  16.8 LV IVS:  1.00 cm      LV e' lateral:   7.23 cm/s LVOT diam:     1.80 cm      LV E/e' lateral: 14.0 LV SV:         93 LV SV Index:   52 LVOT Area:     2.54 cm  LV Volumes (MOD) LV vol d, MOD A2C: 136.0 ml LV vol d, MOD A4C: 135.0 ml LV vol s, MOD A2C: 67.8 ml LV vol s, MOD A4C: 68.6 ml LV SV MOD A2C:     68.2 ml LV SV MOD A4C:     135.0 ml LV SV MOD BP:      68.7 ml RIGHT VENTRICLE RV S prime:     14.10 cm/s TAPSE (M-mode): 2.8 cm LEFT ATRIUM             Index LA diam:        4.20 cm 2.35 cm/m LA Vol (A2C):   86.3 ml 48.22 ml/m LA Vol (A4C):   65.7 ml 36.71 ml/m LA Biplane Vol: 82.1 ml 45.87 ml/m  AORTIC VALVE AV Area (Vmax):    1.77 cm AV Area (Vmean):   1.90 cm AV Area (VTI):     1.95 cm AV Vmax:           174.00 cm/s AV Vmean:          125.000 cm/s AV VTI:            0.477 m AV Peak Grad:      12.1 mmHg AV Mean Grad:      7.0 mmHg LVOT Vmax:         121.00 cm/s LVOT Vmean:        93.200 cm/s LVOT VTI:          0.366 m LVOT/AV VTI ratio: 0.77  AORTA Ao Root diam: 2.90 cm Ao Asc diam:  2.90 cm MITRAL VALVE MV Area (PHT): 2.83 cm     SHUNTS MV Decel Time: 268 msec     Systemic VTI:  0.37 m MV E velocity: 101.00 cm/s  Systemic Diam: 1.80 cm MV A velocity: 130.00  cm/s MV E/A ratio:  0.78 Kardie Tobb DO Electronically signed by Dub Huntsman DO Signature Date/Time: 01/19/2025/2:49:28 PM    Final    CT HEAD WO CONTRAST ( ) Result Date: 01/19/2025 EXAM: CT HEAD WITHOUT CONTRAST 01/19/2025 09:47:00 AM TECHNIQUE: CT of the head was performed without the administration of intravenous contrast. Automated exposure control, iterative reconstruction, and/or weight based adjustment of the mA/kV was utilized to reduce the radiation dose to as low as reasonably achievable. COMPARISON: CTA head and neck this morning, CT head 01/19/2025 02:58 AM. CLINICAL HISTORY: 79 year old female. Status post fall with acute intracranial hemorrhage. FINDINGS: BRAIN AND VENTRICLES: Thick and hyperdense parafalcine extra-axial hemorrhage, appearing as a combination of subdural and subarachnoid blood, measures up to 18 mm thickness in the midline (coronal image 26), stable. Superimposed more mixed and isodense right-sided subdural hematoma is estimated at 4 to 5 mm thickness on coronal images (coronal image 42). Asymmetric left tentorial hyperdense subdural blood measures up to 6 mm. Small volume right greater than left additional scattered subarachnoid hemorrhage. No significant midline shift although there is mild asymmetric mass effect on the right lateral ventricle (series 2 image 17). No parenchymal contusion or intraaxial blood is identified. No cerebral edema. Stable gray white differentiation. Basilar cisterns remain normal. No hydrocephalus. ORBITS: No acute abnormality. SINUSES: Well aerated paranasal sinuses, tympanic cavities,  and mastoids. SOFT TISSUES AND SKULL: No acute soft tissue abnormality. No skull fracture. IMPRESSION: 1. Stable thick parafalcine hemorrhage (18 mm). More iso-intense peripheral right SDH is estimated at 5 mm. And stable similar left tentorial SDH. Stable scattered SAH. 2. Mild right lateral ventricle mass effect with No significant midline shift. 3. No new intracranial  abnormality. Electronically signed by: Helayne Hurst MD 01/19/2025 10:00 AM EST RP Workstation: [id number]   CT Angio Head W or Wo Contrast Result Date: 01/19/2025 EXAM: CTA Head without and with Intravenous Contrast CLINICAL HISTORY: 78y 83m F. Subarachnoid hemorrhage [facility]), status post fall with acute intracranial hemorrhage. TECHNIQUE: Axial CTA images of the head without and with intravenous contrast. MIP reconstructed images were created and reviewed. Dose reduction technique was used including one or more of the following: automated exposure control, adjustment of mA and kV according to patient size, and/or iterative reconstruction. CONTRAST: Without and with; 75mL (iohexol  (OMNIPAQUE ) 350 MG/ML injection 75 mL IOHEXOL  350 MG/ML SOLN) COMPARISON: CT head 01/19/2025 04:44:41 AM, brain MRI 11/01/2008. FINDINGS: INTERNAL CAROTID ARTERIES: Visible cervical ICAs are patent with no significant atherosclerosis or stenosis. Mild ICA siphon tortuosity and mild siphon calcified plaque with no ICA siphon stenosis. Normal ophthalmic and posterior communicating artery origins. No aneurysm. ANTERIOR CEREBRAL ARTERIES: Normal anterior communicating artery. Normal ACA origins. No significant stenosis. No occlusion. No aneurysm. MIDDLE CEREBRAL ARTERIES: Normal MCA origins. No significant stenosis. No occlusion. No aneurysm. POSTERIOR CEREBRAL ARTERIES: Small posterior communicating arteries, the left is larger. Normal PCA origins. No significant stenosis. No occlusion. No aneurysm. BASILAR ARTERY: Patent vertebrobasilar junction with no significant atherosclerosis. No significant stenosis. No occlusion. No aneurysm. VERTEBRAL ARTERIES: Patent distal vertebral arteries with no significant atherosclerosis. No significant stenosis. No occlusion. No aneurysm. Left vertebral artery appears mildly dominant. Normal PICA origins. INTRACRANIAL VENOUS: Major dural venous sinuses are enhancing and patent. GENERAL: No contrast  extravasation or CTA spot sign identified in association with the intracranial hemorrhage better demonstrated on non-contrast CT. No significant midline shift or ventriculomegaly. BONES: Severe upper cervical spine degeneration at C1-C2 with evidence of C2-C3 degenerative ankylosis. No skull fracture identified. IMPRESSION: 1. No intracranial aneurysm, CTA spot sign, or contrast extravasation associated with the acute intracranial hemorrhage. 2. No hemodynamically significant intracranial artery stenosis. Electronically signed by: Helayne Hurst MD 01/19/2025 05:46 AM EST RP Workstation: [id number]   CT Head Wo Contrast Result Date: 01/19/2025 EXAM: CT HEAD WITHOUT CONTRAST 01/19/2025 03:10:23 AM TECHNIQUE: CT of the head was performed without the administration of intravenous contrast. Automated exposure control, iterative reconstruction, and/or weight based adjustment of the mA/kV was utilized to reduce the radiation dose to as low as reasonably achievable. COMPARISON: 12/28/2023 CLINICAL HISTORY: Head trauma, minor (age >= 65 years). FINDINGS: BRAIN AND VENTRICLES: Acute subdural hemorrhage along the right frontal lobe and along the anterior falx measuring up to 1.7 cm in thickness. There is also acute subdural blood along the left tentorial leaflet. Partial effacement of the right lateral ventricle. No evidence of acute infarct. No hydrocephalus. No extra-axial collection. No mass effect. ORBITS: No acute abnormality. SINUSES: No acute abnormality. SOFT TISSUES AND SKULL: No acute soft tissue abnormality. Traumatic brain injury risk stratification: Subdural hematoma (SDH): >8 mm (MBIG 3). Subarachnoid hemorrhage [facility]): None (MBIG 1). Epidural hematoma (EDH): No (MBIG 1). Cerebral contusion, intra-axial, intraparenchymal hemorrhage (IPH): None (MBIG 1). Intraventricular hemorrhage (IVH): No (MBIG 1). Midline shift > 1mm or edema/effacement of sulci/vents: no Skull fracture: none (MBIG 1). IMPRESSION: 1. Acute  subdural hemorrhage along the  right frontal lobe and anterior falx measuring up to 1.7 cm in thickness, also involving the left tentorial leaflet. 2. Findings discussed with Dr. Selinda Sias at 3:23 AM on 01/19/25. Electronically signed by: Franky Stanford MD 01/19/2025 03:24 AM EST RP Workstation: [id number]   DG Chest Portable 1 View Result Date: 01/19/2025 EXAM: 1 VIEW(S) XRAY OF THE CHEST 01/19/2025 02:26:00 AM COMPARISON: 11/02/2024 CLINICAL HISTORY: Evaluation for rales. FINDINGS: LUNGS AND PLEURA: Pulmonary interstitial prominence with mild pulmonary edema. Persistent linear opacities at left base. No pleural effusion. No pneumothorax. HEART AND MEDIASTINUM: Cardiomegaly. Calcified aorta. BONES AND SOFT TISSUES: No acute osseous abnormality. IMPRESSION: 1. Mild pulmonary edema with persistent left basilar subsegmental atelectasis or scarring. 2. Cardiomegaly and aortic atherosclerosis. Electronically signed by: Greig Pique MD 01/19/2025 02:32 AM EST RP Workstation: [id number]    Assessment/Plan: 79 year old woman with hypothermia, falcine subdural hematoma, increased encephalopathy. -- EEG ongoing -- Will repeat CT head today.  Currently no neurosurgical intervention indicated.   Deborah Bryant G Lolitha Tortora 01/20/2025, 8:15 AM

## 2025-01-20 NOTE — Telephone Encounter (Signed)
 [employer] Agent Abbie provided a different fax number, form successfully faxed

## 2025-01-20 NOTE — Progress Notes (Signed)
 I personally reviewed her most recent CT head without contrast. Her falcine subdural is unchanged in size. There is no midline shift. There is some mild effacement of the convexity sulci on the right, but appears mildly improved compared with previous CTs. Cisterns are patent.  I had a long discussion with the patients family at the bedside and by phone. There is no indication for surgical intervention. Will continue with EEG monitoring. I do not see any signs of developing ischemia in the brain, but we can obtain an MRI after her EEG is complete. I discussed with them that her prognosis is guarded, and if she does not have significant improvement over the next few days, her prognosis would be considered poor in terms of meaningful functional recovery.

## 2025-01-20 NOTE — Progress Notes (Signed)
 "  NAME:  Deborah Bryant, MRN:  [id number], DOB:  09/24/1946, LOS: 1 ADMISSION DATE:  01/19/2025, CONSULTATION DATE:  01/19/25 REFERRING MD:  EDP, CHIEF COMPLAINT:  SDH   History of Present Illness:  79 yo female presented 2/2 ams. Arrives via EMS who provides history. Pt is altered and unreliable historian at this time so all information is obtained from chart. Pt in hospital in November for hypothermia and hypoglycemia. She was in hospital for about 1 week and discharged. Pt has not returned to her baseline since discharge but has declined further over past week or 2 with increasing falls.  Pt has becoming increasingly somnolent with incomprehensible speech. She has not been able to fluently or rapidly communicate since her admission for hypoglycemia in November.  Over the past 24 hours pt has had 2 falls. Denies pain. Updated pt's nephew at bedside.   Pertinent  Medical History  Htn Hyperlipidemia Gerd Chronic pain T2dm with hypoglycemia Copd IBS [id number]  Significant Hospital Events: Including procedures, antibiotic start and stop dates in addition to other pertinent events   Admitted to ICU after sdh 2/6  Interim History / Subjective:   Patient awake and alert, states her name, slow to respond.  Objective    Blood pressure (!) 171/60, pulse 83, temperature 99.3 F (37.4 C), resp. rate (!) 29, height 5' 2 (1.575 m), weight 79.3 kg, SpO2 (!) 88%.    FiO2 (%):  [32 %-36 %] 36 %   Intake/Output Summary (Last 24 hours) at 01/20/2025 0846 Last data filed at 01/20/2025 0800 Gross per 24 hour  Intake 2232.71 ml  Output 2355 ml  Net -122.29 ml   Filed Weights   01/19/25 0208 01/20/25 0400  Weight: 77.7 kg 79.3 kg   Physical Exam Vitals and nursing note reviewed.  Constitutional:      Comments: Patient was not awake or alert this morning, not following commands, now intubated and sedated   HENT:     Head: Normocephalic.     Right Ear: External ear normal.     Left Ear: External ear  normal.     Nose: Nose normal.  Eyes:     Pupils: Pupils are equal, round, and reactive to light.  Cardiovascular:     Rate and Rhythm: Normal rate and regular rhythm.     Pulses: Normal pulses.     Heart sounds: Normal heart sounds.  Pulmonary:     Breath sounds: Rhonchi present.     Comments: Patient now intubated  Abdominal:     General: Abdomen is flat. Bowel sounds are normal.     Palpations: Abdomen is soft.  Skin:    General: Skin is warm and dry.  Neurological:     Comments: Patient was not as responsive or alert this morning, unable to protect airway, not following commands. Patient now intubated and sedated          Resolved problem list   Assessment and Plan   Acute Right Frontal Subdural Hematoma  Acute Encephalopathy 2/2 above  Ground level Fall at home  -NSGY following, SBP goal < 150 -Keppra  for seizure prophylaxis, cEEG monitoring -Patient now intubated for airway protection -Repeat CTH ordered   Acute hypoxic respiratory failure 2/2 above Patient developed worsening encephalopathy, hypoxia on 10 L [place], concern for inability to protect airway and aspiration. Patient intubated. -Sedated via fentanyl   -60mg  IV lasix  ordered   Hypothermia, resolved  Elevated TSH -TSH 11.7, T4 0.93, T3 79, cortisol 15.7  Bradycardia,  improving -Cardizem  discontinued in early 2025 due to bradycardia  -Recent admission for bradycardia (HR 50s) felt to be secondary to hypothermia  Chronic hypoxic respiratory failure History of COPD -2L [place] baseline   Hypertension Hyperlipidemia  Chronic diastolic congestive heart failure  -PTA: amlodipine  on hold for hypotension/normotension  Chronic Pain -Norco on hold  CKD 3A -monitor  Elevated alk phos -174, has been elevated since 2019; ggt 11 -consider outpatient follow up  Chronic Normocytic anemia Thrombocytopenia -monitor   Review of Systems:   Patient intubated and sedated   Past Medical History:  She,  has a  past medical history of Allergic rhinitis, Anxiety, Arthritis, Cellulitis, periorbital (2006), Chronic kidney disease, Complication of anesthesia, COPD (chronic obstructive pulmonary disease) (HCC), Cough, Diabetes mellitus, Dyspnea, GERD (gastroesophageal reflux disease), Hemorrhoids, Hyperlipidemia, Hypertension, IBS (irritable bowel syndrome), Loss of smell, Loss of taste, Memory changes, Menopause, MRSA (methicillin resistant Staphylococcus aureus) (2006), Pneumonia, PONV (postoperative nausea and vomiting), and Tubular adenoma of colon (12/2006).   Surgical History:   Past Surgical History:  Procedure Laterality Date   APPENDECTOMY     BACK SURGERY     COLONOSCOPY  04-01-12   per Dr. Aneita, tubular adenoma, repeat in 5 yrs    CYSTOSCOPY WITH BIOPSY N/A 12/11/2021   Procedure: CYSTOSCOPY WITH BIOPSY;  Surgeon: Elisabeth Valli BIRCH, MD;  Location: [facility];  Service: Urology;  Laterality: N/A;   CYSTOSCOPY WITH FULGERATION N/A 12/11/2021   Procedure: CYSTOSCOPY WITH FULGERATION;  Surgeon: Elisabeth Valli BIRCH, MD;  Location: [facility];  Service: Urology;  Laterality: N/A;   POLYPECTOMY     TUBAL LIGATION      Critical care time: 45 minutes  Keven Fila, PA-C [facility] For pager details, please see AMION or use Epic chat  After 1900, please call [facility] for cross coverage needs 01/20/2025, 8:46 AM         "

## 2025-01-20 NOTE — Plan of Care (Signed)
" °  Problem: Clinical Measurements: Goal: Respiratory complications will improve Outcome: Not Progressing   Beginning to become rhonchus throughout and difficulty managing secretions "

## 2025-01-20 NOTE — Progress Notes (Signed)
" °   01/20/25 0200  Nasal Suctioning/Secretions  Suction Type Nasopharyngeal  Suction Device Catheter  Secretion Amount Moderate  Secretion Color White;Pink tinged  Secretion Consistency Thick  Suction Tolerance Tolerated well  Suctioning Adverse Effects None  Chest Physiotherapy (CPT)  CPT Delivery Source Bed  CPT Duration 10  CPT Chest Site Full range  CPT Treatment Tolerance Tolerated well   Patient now with bilat upper lobe rhonchi with difficulty coughing up secretions. eLink notified for PRN NT suctioning. Chest PT performed and NT suction performed with success. Oxygen  sat up to 98% but still some residual rhonchi on exam. "

## 2025-01-20 NOTE — Progress Notes (Signed)
 LTM maint complete - no skin breakdown seen. Reapplied EEG's ecg leads. [facility] monitored- verified.

## 2025-01-20 NOTE — Plan of Care (Signed)
  Problem: Clinical Measurements: Goal: Cardiovascular complication will be avoided Outcome: Progressing   Problem: Nutrition: Goal: Adequate nutrition will be maintained Outcome: Progressing   Problem: Pain Managment: Goal: General experience of comfort will improve and/or be controlled Outcome: Progressing

## 2025-01-21 LAB — BASIC METABOLIC PANEL WITH GFR
Anion gap: 11 (ref 5–15)
Anion gap: 13 (ref 5–15)
BUN: 36 mg/dL — ABNORMAL HIGH (ref 8–23)
BUN: 40 mg/dL — ABNORMAL HIGH (ref 8–23)
CO2: 26 mmol/L (ref 22–32)
CO2: 22 mmol/L (ref 22–32)
Calcium: 9.4 mg/dL (ref 8.9–10.3)
Calcium: 9 mg/dL (ref 8.9–10.3)
Chloride: 113 mmol/L — ABNORMAL HIGH (ref 98–111)
Chloride: 113 mmol/L — ABNORMAL HIGH (ref 98–111)
Creatinine, Ser: 1.17 mg/dL — ABNORMAL HIGH (ref 0.44–1.00)
Creatinine, Ser: 1.27 mg/dL — ABNORMAL HIGH (ref 0.44–1.00)
GFR, Estimated: 48 mL/min — ABNORMAL LOW
GFR, Estimated: 43 mL/min — ABNORMAL LOW
Glucose, Bld: 134 mg/dL — ABNORMAL HIGH (ref 70–99)
Glucose, Bld: 110 mg/dL — ABNORMAL HIGH (ref 70–99)
Potassium: 3.2 mmol/L — ABNORMAL LOW (ref 3.5–5.1)
Potassium: 3.8 mmol/L (ref 3.5–5.1)
Sodium: 150 mmol/L — ABNORMAL HIGH (ref 135–145)
Sodium: 148 mmol/L — ABNORMAL HIGH (ref 135–145)

## 2025-01-21 LAB — CULTURE, BLOOD (ROUTINE X 2)
Culture: NO GROWTH
Culture: NO GROWTH
Special Requests: ADEQUATE
Special Requests: ADEQUATE

## 2025-01-21 LAB — CBC
HCT: 25.8 % — ABNORMAL LOW (ref 36.0–46.0)
Hemoglobin: 8.4 g/dL — ABNORMAL LOW (ref 12.0–15.0)
MCH: 30.3 pg (ref 26.0–34.0)
MCHC: 32.6 g/dL (ref 30.0–36.0)
MCV: 93.1 fL (ref 80.0–100.0)
Platelets: 121 10*3/uL — ABNORMAL LOW (ref 150–400)
RBC: 2.77 MIL/uL — ABNORMAL LOW (ref 3.87–5.11)
RDW: 17.9 % — ABNORMAL HIGH (ref 11.5–15.5)
WBC: 13.4 10*3/uL — ABNORMAL HIGH (ref 4.0–10.5)
nRBC: 0 % (ref 0.0–0.2)

## 2025-01-21 LAB — CULTURE, RESPIRATORY W GRAM STAIN

## 2025-01-21 LAB — GLUCOSE, CAPILLARY
Glucose-Capillary: 100 mg/dL — ABNORMAL HIGH (ref 70–99)
Glucose-Capillary: 110 mg/dL — ABNORMAL HIGH (ref 70–99)
Glucose-Capillary: 113 mg/dL — ABNORMAL HIGH (ref 70–99)
Glucose-Capillary: 117 mg/dL — ABNORMAL HIGH (ref 70–99)
Glucose-Capillary: 118 mg/dL — ABNORMAL HIGH (ref 70–99)
Glucose-Capillary: 77 mg/dL (ref 70–99)

## 2025-01-21 LAB — TRIGLYCERIDES: Triglycerides: 55 mg/dL

## 2025-01-21 LAB — PHOSPHORUS: Phosphorus: 2.3 mg/dL — ABNORMAL LOW (ref 2.5–4.6)

## 2025-01-21 LAB — MAGNESIUM: Magnesium: 2.2 mg/dL (ref 1.7–2.4)

## 2025-01-21 MED ORDER — OSMOLITE 1.5 CAL PO LIQD
1000.0000 mL | ORAL | Status: AC
  Administered 2025-01-21: 1000 mL

## 2025-01-21 MED ORDER — POTASSIUM CHLORIDE 20 MEQ PO PACK
40.0000 meq | PACK | Freq: Once | ORAL | Status: AC
  Administered 2025-01-21: 40 meq
  Filled 2025-01-21: qty 2

## 2025-01-21 MED ORDER — REVEFENACIN 175 MCG/3ML IN SOLN: 175.0000 ug | Freq: Every day | RESPIRATORY_TRACT | Status: AC

## 2025-01-21 MED ORDER — FUROSEMIDE 10 MG/ML IJ SOLN: 40.0000 mg | Freq: Two times a day (BID) | INTRAMUSCULAR | Status: AC

## 2025-01-21 MED ORDER — FUROSEMIDE 10 MG/ML IJ SOLN: 40.0000 mg | Freq: Every day | INTRAMUSCULAR | Status: DC

## 2025-01-21 MED ORDER — POTASSIUM CHLORIDE 20 MEQ PO PACK
20.0000 meq | PACK | Freq: Once | ORAL | Status: AC
  Administered 2025-01-21: 20 meq
  Filled 2025-01-21: qty 1

## 2025-01-21 MED ORDER — SODIUM CHLORIDE 0.9 % IV SOLN
250.0000 mL | INTRAVENOUS | Status: AC
  Administered 2025-01-21: 250 mL via INTRAVENOUS

## 2025-01-21 MED ORDER — POTASSIUM PHOSPHATES 15 MMOLE/5ML IV SOLN
15.0000 mmol | Freq: Once | INTRAVENOUS | Status: AC
  Administered 2025-01-21: 15 mmol via INTRAVENOUS
  Filled 2025-01-21: qty 5

## 2025-01-21 MED ORDER — FREE WATER
200.0000 mL | Status: AC
  Administered 2025-01-21 (×5): 200 mL

## 2025-01-21 MED ORDER — NOREPINEPHRINE 4 MG/250ML-% IV SOLN: 0.0000 ug/min | INTRAVENOUS | Status: AC

## 2025-01-21 MED ORDER — FUROSEMIDE 10 MG/ML IJ SOLN: 40.0000 mg | Freq: Once | INTRAMUSCULAR | Status: DC

## 2025-01-21 MED ORDER — FUROSEMIDE 10 MG/ML IJ SOLN
40.0000 mg | Freq: Once | INTRAMUSCULAR | Status: AC
  Administered 2025-01-21: 40 mg via INTRAVENOUS
  Filled 2025-01-21: qty 4

## 2025-01-21 MED ORDER — FUROSEMIDE 10 MG/ML IJ SOLN: 40.0000 mg | Freq: Two times a day (BID) | INTRAMUSCULAR | Status: DC

## 2025-01-21 MED ORDER — SODIUM CHLORIDE 0.9 % IV SOLN
2.0000 g | INTRAVENOUS | Status: AC
  Administered 2025-01-21: 2 g via INTRAVENOUS
  Filled 2025-01-21: qty 20

## 2025-01-21 MED ORDER — IPRATROPIUM-ALBUTEROL 0.5-2.5 (3) MG/3ML IN SOLN: 3.0000 mL | Freq: Four times a day (QID) | RESPIRATORY_TRACT | Status: AC | PRN

## 2025-01-21 MED ORDER — ARFORMOTEROL TARTRATE 15 MCG/2ML IN NEBU
15.0000 ug | INHALATION_SOLUTION | Freq: Two times a day (BID) | RESPIRATORY_TRACT | Status: AC
  Administered 2025-01-21: 15 ug via RESPIRATORY_TRACT
  Filled 2025-01-21: qty 2

## 2025-01-21 NOTE — Procedures (Signed)
 VENTILATOR WEAN NOTE 01/21/2025  Start Mode: PRVC  Wean Mode: Pressure Support  Duration before failure: No wean   Reason for failure: No wean   Notes: No wean at this time. Patient is currently on a continuous EEG.

## 2025-01-21 NOTE — Plan of Care (Signed)

## 2025-01-21 NOTE — Progress Notes (Addendum)
 Nutrition Follow-up  DOCUMENTATION CODES:   Not applicable  INTERVENTION:   Continue tube feeding via Cortrak tube: Osmolite 1.5 currently at 10 ml/hr and increase by 10 ml every 8 hours to goal rate of 40 ml/h (960 ml per day)  Prosource TF20 60 ml BID  Provides 1600 kcal, 100 gm protein, 729 ml free water  daily  Monitor magnesium  and phosphorus daily x 4 occurrences or until WNL per adult tube feeding protocol, MD to replete as needed.   200 ml free water  every 4 hours  Total free water : 1929 ml   NUTRITION DIAGNOSIS:   Inadequate oral intake related to acute illness as evidenced by meal completion < 50%. Ongoing.   GOAL:   Patient will meet greater than or equal to 90% of their needs Met with TF at goal   MONITOR:   PO intake, TF tolerance, Labs  REASON FOR ASSESSMENT:   Consult Enteral/tube feeding initiation and management  ASSESSMENT:   Pt with PMH of current smoker, PNA, HTN, HLD, GERD, DM, COPD, CKD, hypoglycemia (dx Nov 2025), CHF on lasix , IBS admitted after falls at home with acute R frontal SDH.   Pt discussed during ICU rounds and with RN and MD.  Noted pt with emesis 2/5 after TF initiation  Patient is currently intubated on ventilator support for airway protection    Temp (24hrs), Avg:99.3 F (37.4 C), Min:98.6 F (37 C), Max:99.9 F (37.7 C)  2/4 - s/p cortrak placement; tip gastric 2/5 - intubated for worsening encephalopathy/airway protection    UOP 2425 ml   Medications reviewed and include: pepcid , Keppra , lasix  40 mg BID, SSI every 4 hours 40 mEq KCl x 1  LR @ 75 ml/hr Fentanyl   15 mmol Kphos x 1  Propofol  @ 2 ml/hr provides: 52 kcal   Labs reviewed:  Na 150  Refeeding Labs: Recent Labs  Lab 01/19/25 0216 01/19/25 0222 01/19/25 1753 01/20/25 0529 01/20/25 0941 01/21/25 0534  K 4.7   < >  --  3.9 3.4* 3.2*  MG 2.0  --   --  2.1  --  2.2  PHOS  --   --  3.3 2.6  --  2.3*   < > = values in this interval not displayed.    Glucose Profile:  Recent Labs    01/21/25 0353 01/21/25 0735 01/21/25 1122  GLUCAP 117* 118* 100*   Lab Results  Component Value Date   HGBA1C 6.7 (H) 08/19/2024    Diet Order:   Diet Order             Diet NPO time specified  Diet effective now                   EDUCATION NEEDS:   Education needs have been addressed (with family)  Skin:  Skin Assessment: Reviewed RN Assessment  Last BM:  2/6 x 3 after being disimpacted per RN  Height:   Ht Readings from Last 1 Encounters:  01/20/25 5' 2 (1.575 m)    Weight:   Wt Readings from Last 1 Encounters:  01/21/25 80.5 kg    BMI:  Body mass index is 32.46 kg/m.  Estimated Nutritional Needs:   Kcal:  1500-1700  Protein:  80-100 grams  Fluid:  > 1.5 L/day  Powell SQUIBB., RD, LDN, CNSC See AMiON for contact information

## 2025-01-21 NOTE — Plan of Care (Signed)
  Problem: Clinical Measurements: Goal: Diagnostic test results will improve Outcome: Progressing Goal: Cardiovascular complication will be avoided Outcome: Progressing   Problem: Nutrition: Goal: Adequate nutrition will be maintained Outcome: Progressing   Problem: Elimination: Goal: Will not experience complications related to bowel motility Outcome: Progressing

## 2025-01-21 NOTE — TOC Initial Note (Signed)
 Transition of Care [facility]) - Initial/Assessment Note    Patient Details  Name: Deborah Bryant MRN: [id number] Date of Birth: 1945/12/23  [facility]) CM/SW Contact:    Inocente GORMAN Kindle, LCSW Phone Number: 01/21/2025, 9:46 AM  Clinical Narrative:                 Patient admitted from home with spouse undergoing workup for Subdural Hematoma after fall. Patient is currently intubated. Patient has home O2. ICM to follow for any needs at discharge.     Barriers to Discharge: Continued Medical Work up   Patient Goals and CMS Choice            Expected Discharge Plan and Services In-house Referral: Clinical Social Work                                            Prior Living Arrangements/Services   Lives with:: Spouse Patient language and need for interpreter reviewed:: Yes Do you feel safe going back to the place where you live?: Yes      Need for Family Participation in Patient Care: Yes (Comment) Care giver support system in place?: Yes (comment) Current home services: DME Criminal Activity/Legal Involvement Pertinent to Current Situation/Hospitalization: No - Comment as needed  Activities of Daily Living      Permission Sought/Granted                  Emotional Assessment Appearance:: Appears stated age Attitude/Demeanor/Rapport: Unable to Assess Affect (typically observed): Unable to Assess Orientation: :  (intubated) Alcohol / Substance Use: Not Applicable Psych Involvement: No (comment)  Admission diagnosis:  Bradycardia [R00.1] SDH (subdural hematoma) (HCC) [S06.5XAA] Subdural bleeding (HCC) [I62.00] Hypothermia, initial encounter [T68.XXXA] Patient Active Problem List   Diagnosis Date Noted   Acute hypoxic respiratory failure (HCC) 01/20/2025   SDH (subdural hematoma) (HCC) 01/19/2025   Bradycardia 01/19/2025   Encephalopathy acute 01/19/2025   Subdural bleeding (HCC) 01/19/2025   Hypoglycemia 10/31/2024   Hyperglycemia due to  type 2 diabetes mellitus (HCC) 10/31/2024   Primary localized osteoarthrosis of multiple sites 10/31/2024   Elevated alkaline phosphatase level 10/31/2024   Elevated serum creatinine 10/31/2024   Grade I diastolic dysfunction 10/31/2024   Postmenopausal vaginal bleeding 02/13/2024   Chronic obstructive pulmonary disease (HCC) 02/13/2024   Acute respiratory failure with hypoxia (HCC) 12/28/2023   Hyponatremia 12/28/2023   Chronic pain 12/28/2023   Prolonged QT interval 12/28/2023   Acute on chronic diastolic CHF (congestive heart failure) (HCC) 12/28/2023   Left lower lobe pneumonia 12/28/2023   Acute encephalopathy 12/28/2023   Hypothermia 12/28/2023   Bilateral leg edema 08/19/2023   Chronic pain of both knees 08/19/2023   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   Confusion 11/01/2019   Anosmia 11/01/2019   Ageusia 11/01/2019   Loss of smell 02/02/2019   Loss of taste 02/02/2019   Degenerative spondylolisthesis 09/29/2018   Chronic cough 09/09/2018   Low back pain 06/09/2018   Shoulder pain, bilateral 03/13/2018   Acute renal failure superimposed on stage 2 chronic kidney disease 07/03/2017   IBS (irritable bowel syndrome) 07/03/2017   Vitamin D  deficiency 08/01/2015   Mixed simple and mucopurulent chronic bronchitis (HCC) 06/11/2015   Chorioretinal scar, 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   Nuclear cataract, bilateral 06/16/2014   Smoker 09/07/2010   Normocytic anemia 03/29/2008   Gout 10/07/2007   Diabetes mellitus without complication (  HCC) 07/21/2007   Hyperlipemia 07/21/2007   Essential hypertension 07/21/2007   PCP:  Johnny Garnette LABOR, MD Pharmacy:   [facility] [place], [place] - [address] [place] [address] Phone: [phone] Fax: [phone]  [facility] [place] [facility] [address] [place] [address] Phone: [phone] Fax: [phone]  [facility] - [facility]  Pharmacy [address] [place] [address] Phone: [phone] Fax: [phone]     Social Drivers of Health (SDOH) Social History: SDOH Screenings   Food Insecurity: No Food Insecurity (01/19/2025)  Housing: Low Risk (01/19/2025)  Transportation Needs: No Transportation Needs (01/19/2025)  Utilities: Not At Risk (01/19/2025)  Depression (PHQ2-9): Low Risk (11/05/2024)  Social Connections: Moderately Isolated (01/19/2025)  Tobacco Use: High Risk (11/18/2024)   SDOH Interventions:     Readmission Risk Interventions    11/02/2024    3:40 PM  Readmission Risk Prevention Plan  Transportation Screening Complete  PCP or Specialist Appt within 5-7 Days Complete  Home Care Screening Complete  Medication Review (RN CM) Complete

## 2025-01-21 NOTE — Procedures (Signed)
 Patient Name: Deborah Bryant  MRN: [id number]  Epilepsy Attending: Arlin MALVA Krebs  Referring Physician/Provider: Ruthine Cory, PA-C  Duration: 01/20/2025 1615 to 01/21/2025 1145   Patient history: 37F with DM2 and prior hospitalization in Nov for hypoglycemia, bradycardia and hypothermia who presents with confusion after recent falls fall last night. CT head with R frontal and large falcine SDH. EEG to evaluate for seizure   Level of alertness: Awake/ lethargic , asleep   AEDs during EEG study: LEV   Technical aspects: This EEG study was done with scalp electrodes positioned according to the 10-20 International system of electrode placement. Electrical activity was reviewed with band pass filter of 1-70Hz , sensitivity of 7 uV/mm, display speed of 74mm/sec with a 60Hz  notched filter applied as appropriate. EEG data were recorded continuously and digitally stored.  Video monitoring was available and reviewed as appropriate.   Description: EEG showed continuous generalized and lateralized right hemisphere 3-6hz  theta- delta slowing, at times with triphasic morphology and quasi periodic at 0.5 to 1.5 Hz. Sleep was characterized by sleep spindles (121-4Hz ), maximal fronto-central region. Hyperventilation and photic stimulation were not performed.      ABNORMALITY - Continuous slow, generalized and lateralized right hemisphere   IMPRESSION: This study is suggestive of cortical dysfunction in right hemisphere likely secondary to underlying structural abnormality.  Additionally there is generalized cerebral dysfunction (encephalopathy).  No seizures or definite epileptiform discharges were seen throughout the recording.   Hieu Herms O Arnetra Terris

## 2025-01-21 NOTE — Progress Notes (Signed)
 Subjective: No acute events overnight  Objective: Vital signs in last 24 hours: Temp:  [98.6 F (37 C)-99.9 F (37.7 C)] 99.5 F (37.5 C) (02/06 1730) Pulse Rate:  [49-65] 52 (02/06 1730) Resp:  [18-27] 22 (02/06 1730) BP: (117-146)/(38-56) 122/42 (02/06 1730) SpO2:  [97 %-100 %] 100 % (02/06 1730) FiO2 (%):  [40 %] 40 % (02/06 1557) Weight:  [80.5 kg] 80.5 kg (02/06 0459)  Intake/Output from previous day: 02/05 0701 - 02/06 0700 In: 1484.3 [I.V.:394; NG/GT:680.3; IV Piggyback:410] Out: 2425 [Urine:2425] Intake/Output this shift: Total I/O In: 1265.9 [I.V.:50.6; NG/GT:759.8; IV Piggyback:455.5] Out: 450 [Urine:450]  Intubated Scalp EEG leads in place Localizes bilaterally to stimulation in upper extremities, withdraws in lower extremities.  Eyes closed, pupils bilaterally reactive  Lab Results: Recent Labs    01/20/25 0529 01/20/25 0941 01/21/25 0858  WBC 10.2  --  13.4*  HGB 8.7* 9.2* 8.4*  HCT 25.6* 27.0* 25.8*  PLT 143*  --  121*   BMET Recent Labs    01/21/25 0534 01/21/25 1511  NA 150* 148*  K 3.2* 3.8  CL 113* 113*  CO2 26 22  GLUCOSE 134* 110*  BUN 36* 40*  CREATININE 1.17* 1.27*  CALCIUM  9.4 9.0    Studies/Results: CT HEAD WO CONTRAST ( ) Result Date: 01/20/2025 EXAM: CT HEAD WITHOUT CONTRAST 01/20/2025 09:56:00 AM TECHNIQUE: CT of the head was performed without the administration of intravenous contrast. Automated exposure control, iterative reconstruction, and/or weight based adjustment of the mA/kV was utilized to reduce the radiation dose to as low as reasonably achievable. COMPARISON: CT head 01/19/2025. CLINICAL HISTORY: Known subdural hematoma, now unresponsive, intubated. FINDINGS: BRAIN AND VENTRICLES: Nasal enteric tube is present. There is overall similar right greatrer than left parafalcine subdural hematoma measuring up to 1.7 cm in maximal combined thickness. There are scattered foci of subarachnoid sulcal hemorrhage along the right  frontal convexity most notably. There is an overall similar 0.5 cm right frontal cerebral convexity subdural hematoma and layering subdural hemorrhage along the left tentorium up to 0.9 cm, similar to prior exam. There is local associated mass effect without midline shift or brain herniation. No acute territorial infarct. No hydrocephalus. ORBITS: No acute abnormality. SINUSES: No acute abnormality. SOFT TISSUES AND SKULL: No acute soft tissue abnormality. No skull fracture. IMPRESSION: 1. Overall similar right greater than left parafalcine subdural hematoma and right frontal convexity subdural hematoma with regional mass effect, without midline shift or brain herniation. 2. Scattered right frontal convexity subarachnoid hemorrhage. 3. Overall similar layering subdural hemorrhage along the left tentorium. Electronically signed by: Prentice Spade MD 01/20/2025 10:09 AM EST RP Workstation: [id number]   DG CHEST PORT 1 VIEW Result Date: 01/20/2025 CLINICAL DATA:  Endotracheal tube placement. EXAM: PORTABLE CHEST 1 VIEW COMPARISON:  Same day FINDINGS: Endotracheal tube appears to be in grossly good position. Feeding tube is seen entering stomach. IMPRESSION: Endotracheal tube appears to be in grossly good position. Electronically Signed   By: Lynwood Landy Raddle M.D.   On: 01/20/2025 09:43   DG CHEST PORT 1 VIEW Result Date: 01/20/2025 CLINICAL DATA:  Endotracheal tube placement EXAM: PORTABLE CHEST 1 VIEW COMPARISON:  Same day FINDINGS: Stable cardiomediastinal silhouette. Endotracheal tube is in grossly good position. Feeding tube is seen entering stomach. No significant consolidative process is noted. IMPRESSION: Endotracheal tube is in grossly good position. Electronically Signed   By: Lynwood Landy Raddle M.D.   On: 01/20/2025 09:42   Overnight EEG with video Result Date: 01/20/2025 Shelton Arlin KIDD, MD  01/21/2025 10:28 AM Patient Name: Deborah Bryant MRN: [id number] Epilepsy Attending: Arlin MALVA Krebs Referring  Physician/Provider: Ruthine Cory, PA-C Duration: 01/19/2025 1615 to 01/20/2025 1615 Patient history: 71F with DM2 and prior hospitalization in Nov for hypoglycemia, bradycardia and hypothermia who presents with confusion after recent falls fall last night. CT head with R frontal and large falcine SDH. EEG to evaluate for seizure Level of alertness: Awake/ lethargic AEDs during EEG study: LEV Technical aspects: This EEG study was done with scalp electrodes positioned according to the 10-20 International system of electrode placement. Electrical activity was reviewed with band pass filter of 1-70Hz , sensitivity of 7 uV/mm, display speed of 33mm/sec with a 60Hz  notched filter applied as appropriate. EEG data were recorded continuously and digitally stored.  Video monitoring was available and reviewed as appropriate. Description: EEG showed continuous generalized and lateralized right hemisphere 3-6hz  theta- delta slowing, at times with triphasic morphology and quasi periodic at 1.5 to 2 Hz.  After around 830 on 01/20/2025, EEG evolved into continuous generalized and lateralized right hemisphere low amplitude 3-6hz  theta- delta slowing admixed with 12 to 15 Hz with activity.  Hyperventilation and photic stimulation were not performed.   ABNORMALITY - Continuous slow, generalized and lateralized right hemisphere IMPRESSION: This study is suggestive of cortical dysfunction in right hemisphere likely secondary to underlying structural abnormality.  Additionally there is generalized cerebral dysfunction (encephalopathy).  No seizures or definite epileptiform discharges were seen throughout the recording. Arlin MALVA Krebs   DG Chest Port 1 View Result Date: 01/20/2025 EXAM: 1 VIEW(S) XRAY OF THE CHEST 01/20/2025 02:07:16 AM COMPARISON: 01/19/2025 CLINICAL HISTORY: ICD10: 5626 Acute respiratory failure (HCC); 345945 Aspiration into lower respiratory tract. FINDINGS: LINES, TUBES AND DEVICES: Weighted enteric tube in place with  tip below the diaphragm. LUNGS AND PLEURA: Decreased pulmonary edema from prior exam. Decreased atelectasis and left lung base opacity. Small bilateral pleural effusions suspected. No pneumothorax. HEART AND MEDIASTINUM: Stable cardiomegaly. No acute abnormality of the mediastinal silhouette. BONES AND SOFT TISSUES: No acute osseous abnormality. IMPRESSION: 1. Decreased pulmonary edema and atelectasis compared to prior exam. 2. Small bilateral pleural effusions suspected. 3. Weighted enteric tube tip projects below the diaphragm. Electronically signed by: Greig Pique MD 01/20/2025 02:14 AM EST RP Workstation: [id number]    Assessment/Plan: 79 year old woman with history of recent cognitive decline, hospitalization 2 months ago after cardiac arrest who presented to the hospital after neurologic decline.  Found to be hypothermic and with a falcine subdural hematoma, unclear if trauma related or spontaneous but no source found on CTA.  Repeat CTs have been stable.  No neurosurgical intervention is indicated.  Would recommend MRI without contrast after EEG leads removed.  Continue supportive care for now.

## 2025-01-21 NOTE — Progress Notes (Signed)
 "  NAME:  Deborah Bryant, MRN:  [id number], DOB:  10-Nov-1946, LOS: 2 ADMISSION DATE:  01/19/2025, CONSULTATION DATE:  01/19/25 REFERRING MD:  EDP, CHIEF COMPLAINT:  SDH   History of Present Illness:  79 yo female presented 2/2 ams. Arrives via EMS who provides history. Pt is altered and unreliable historian at this time so all information is obtained from chart. Pt in hospital in November for hypothermia and hypoglycemia. She was in hospital for about 1 week and discharged. Pt has not returned to her baseline since discharge but has declined further over past week or 2 with increasing falls.  Pt has becoming increasingly somnolent with incomprehensible speech. She has not been able to fluently or rapidly communicate since her admission for hypoglycemia in November.  Over the past 24 hours pt has had 2 falls. Denies pain. Updated pt's nephew at bedside.   Pertinent  Medical History  Htn Hyperlipidemia Gerd Chronic pain T2dm with hypoglycemia Copd IBS [id number]  Significant Hospital Events: Including procedures, antibiotic start and stop dates in addition to other pertinent events   2/4: Admitted to ICU after SDH 2/5: Intubated for worsening encephalopathy, increased O2 requirements and airway protection. Repeat CTH stable   Interim History / Subjective:  Remains intubated and sedated. No acute events overnight.   Objective    Blood pressure (!) 117/47, pulse (!) 49, temperature 99.3 F (37.4 C), resp. rate (!) 21, height 5' 2 (1.575 m), weight 80.5 kg, SpO2 100%.    Vent Mode: PRVC FiO2 (%):  [40 %] 40 % Set Rate:  [20 bmp] 20 bmp Vt Set:  [400 mL] 400 mL PEEP:  [5 cmH20] 5 cmH20 Plateau Pressure:  [10 cmH20-12 cmH20] 10 cmH20   Intake/Output Summary (Last 24 hours) at 01/21/2025 0951 Last data filed at 01/21/2025 9061 Gross per 24 hour  Intake 1545.77 ml  Output 2230 ml  Net -684.23 ml   Filed Weights   01/19/25 0208 01/20/25 0400 01/21/25 0459  Weight: 77.7 kg 79.3 kg 80.5 kg    Physical Exam: General: critically ill appearing female, intubated and sedated  HENT: /AT, sclera anicteric, PERRL Neuro: with sedation paused, localized BUE, withdraws BLE, does not open eyes to painful stimuli, does not follow commands  Pulm: on mechanical ventilation, synchronous, coarse bilaterally CV: RRR, S1S2 Abd: soft, non-distended, +BS Extremities: warm, dry, no edema   Resolved problem list   Assessment and Plan   Acute Right Frontal Subdural Hematoma  Acute Encephalopathy 2/2 above  Ground level Fall at home  - NSGY following, SBP goal < 150 - Keppra  for seizure prophylaxis, cEEG monitoring currently with no evidence of seizure  - Planning for MRI when off EEG   Acute hypoxic respiratory failure 2/2 above Possible aspiration pneumonia  History of COPD - Continue LTVV - VAP protocol - PAD protocol with fentanyl  and propofol  - Switch from Unasyn  to ceftriaxone  given hypernatremia, will plan for total 5 day course  - LAMA/LABA, PRN Duo-nebs   Sinus bradycardia, likely due to sedation - Adequate blood pressure - Continue tele monitoring   Hypertension Hyperlipidemia  Chronic diastolic congestive heart failure  - Holding PTA amlodipine  and atorvastatin  for now  - IV diuresis with 40 mg IV Lasix    Chronic Pain - PTA norco on hold for now   Hypernatremia CKD 3A; baseline Cr appears to be ~1.0-1.3  Hypophosphatemia Hypokalemia  - Add FWF and recheck afternoon BMP, would not correct too quickly given SDH - Continue to monitor BMP -  Replete electrolytes as needed  - Start 40 mg IV Lasix  daily for goal even to slightly negative, may need to increase to BID  Elevated TSH; TSH 11.7, T4 0.93, T3 79, cortisol 15.7 - Recommend repeating in 4-6 weeks   Elevated alk phos;174, has been elevated since 2019, ggt 11 - Consider outpatient follow up  Chronic Normocytic anemia Thrombocytopenia No active s/s bleeding.  - Continue to monitor daily CBC    Signature The patient is critically ill with multiple organ system failure and requires high complexity decision making for assessment and support, frequent evaluation and titration of therapies, advanced monitoring, review of radiographic studies and interpretation of complex data.   Critical Care Time devoted to patient care services, exclusive of separately billable procedures, described in this note is 40 minutes.  Rexene LOISE Blush, PA-C [facility] 01/21/25 1:15 PM  Please see [email or web address] for pager details.  From 7A-7P if no response, please call [phone] After hours, please call ELink [phone]      "

## 2025-01-21 NOTE — Progress Notes (Signed)
 EGG tech called, message left to remove EEG

## 2025-01-21 NOTE — Progress Notes (Signed)
 SLP Cancellation and D/C Note  Patient Details Name: Deborah Bryant MRN: [id number] DOB: [birth year]   Cancelled treatment:  Pt intubated 2/5.  SLP will sign off. Please reconsult when indicated.  Aubrei Bouchie L. Vona, MA CCC/SLP Clinical Specialist - Acute Care SLP Acute Rehabilitation Services Office number [phone]          [name] 01/21/2025, 11:19 AM

## 2025-03-09 ENCOUNTER — Encounter

## 2025-03-09 ENCOUNTER — Ambulatory Visit: Admitting: Pulmonary Disease

## 2025-05-11 ENCOUNTER — Ambulatory Visit: Admitting: Cardiology
